# Patient Record
Sex: Female | Born: 1955 | Race: White | Hispanic: No | Marital: Married | Smoking: Former smoker
Health system: Southern US, Community
[De-identification: ages and names within clinical notes are randomized; demographics above are authoritative.]

## PROBLEM LIST (undated history)

## (undated) DIAGNOSIS — M5432 Sciatica, left side: Secondary | ICD-10-CM

## (undated) DIAGNOSIS — F329 Major depressive disorder, single episode, unspecified: Secondary | ICD-10-CM

## (undated) DIAGNOSIS — E785 Hyperlipidemia, unspecified: Secondary | ICD-10-CM

## (undated) DIAGNOSIS — Z860100 Personal history of colon polyps, unspecified: Secondary | ICD-10-CM

## (undated) DIAGNOSIS — T7840XA Allergy, unspecified, initial encounter: Secondary | ICD-10-CM

## (undated) DIAGNOSIS — F32A Depression, unspecified: Secondary | ICD-10-CM

## (undated) DIAGNOSIS — M8580 Other specified disorders of bone density and structure, unspecified site: Secondary | ICD-10-CM

## (undated) DIAGNOSIS — I2510 Atherosclerotic heart disease of native coronary artery without angina pectoris: Secondary | ICD-10-CM

## (undated) DIAGNOSIS — I10 Essential (primary) hypertension: Secondary | ICD-10-CM

## (undated) HISTORY — PX: BUNIONECTOMY: SHX129

## (undated) HISTORY — DX: Hyperlipidemia, unspecified: E78.5

## (undated) HISTORY — DX: Other specified disorders of bone density and structure, unspecified site: M85.80

## (undated) HISTORY — DX: Personal history of colonic polyps: Z86.010

## (undated) HISTORY — PX: COLONOSCOPY W/ POLYPECTOMY: SHX1380

## (undated) HISTORY — DX: Sciatica, left side: M54.32

## (undated) HISTORY — PX: NASAL SINUS SURGERY: SHX719

## (undated) HISTORY — DX: Essential (primary) hypertension: I10

## (undated) HISTORY — DX: Allergy, unspecified, initial encounter: T78.40XA

## (undated) HISTORY — DX: Major depressive disorder, single episode, unspecified: F32.9

## (undated) HISTORY — DX: Personal history of colon polyps, unspecified: Z86.0100

## (undated) HISTORY — PX: POLYPECTOMY: SHX149

## (undated) HISTORY — DX: Depression, unspecified: F32.A

## (undated) HISTORY — PX: CARDIAC CATHETERIZATION: SHX172

---

## 1997-12-04 HISTORY — PX: CERVICAL FUSION: SHX112

## 1998-03-25 ENCOUNTER — Inpatient Hospital Stay (HOSPITAL_COMMUNITY): Admission: RE | Admit: 1998-03-25 | Discharge: 1998-03-27 | Payer: Self-pay | Admitting: Neurosurgery

## 1999-07-28 ENCOUNTER — Other Ambulatory Visit: Admission: RE | Admit: 1999-07-28 | Discharge: 1999-07-28 | Payer: Self-pay | Admitting: Obstetrics and Gynecology

## 2000-10-08 ENCOUNTER — Emergency Department (HOSPITAL_COMMUNITY): Admission: EM | Admit: 2000-10-08 | Discharge: 2000-10-08 | Payer: Self-pay

## 2000-11-08 ENCOUNTER — Ambulatory Visit (HOSPITAL_COMMUNITY): Admission: RE | Admit: 2000-11-08 | Discharge: 2000-11-08 | Payer: Self-pay | Admitting: Obstetrics and Gynecology

## 2001-03-28 ENCOUNTER — Encounter: Admission: RE | Admit: 2001-03-28 | Discharge: 2001-03-28 | Payer: Self-pay | Admitting: Internal Medicine

## 2001-03-28 ENCOUNTER — Encounter: Payer: Self-pay | Admitting: Internal Medicine

## 2001-08-22 ENCOUNTER — Other Ambulatory Visit: Admission: RE | Admit: 2001-08-22 | Discharge: 2001-08-22 | Payer: Self-pay | Admitting: Obstetrics and Gynecology

## 2001-09-03 ENCOUNTER — Encounter: Payer: Self-pay | Admitting: Internal Medicine

## 2001-09-03 ENCOUNTER — Inpatient Hospital Stay (HOSPITAL_COMMUNITY): Admission: EM | Admit: 2001-09-03 | Discharge: 2001-09-06 | Payer: Self-pay | Admitting: Internal Medicine

## 2001-11-13 ENCOUNTER — Inpatient Hospital Stay (HOSPITAL_COMMUNITY): Admission: RE | Admit: 2001-11-13 | Discharge: 2001-11-15 | Payer: Self-pay | Admitting: Obstetrics & Gynecology

## 2001-11-13 ENCOUNTER — Encounter (INDEPENDENT_AMBULATORY_CARE_PROVIDER_SITE_OTHER): Payer: Self-pay | Admitting: Specialist

## 2001-12-04 HISTORY — PX: TOTAL ABDOMINAL HYSTERECTOMY W/ BILATERAL SALPINGOOPHORECTOMY: SHX83

## 2004-04-05 ENCOUNTER — Other Ambulatory Visit: Admission: RE | Admit: 2004-04-05 | Discharge: 2004-04-05 | Payer: Self-pay | Admitting: Obstetrics and Gynecology

## 2004-11-07 ENCOUNTER — Ambulatory Visit: Payer: Self-pay | Admitting: Internal Medicine

## 2005-05-25 ENCOUNTER — Ambulatory Visit: Payer: Self-pay | Admitting: Internal Medicine

## 2005-06-02 ENCOUNTER — Ambulatory Visit: Payer: Self-pay | Admitting: Internal Medicine

## 2005-08-15 ENCOUNTER — Ambulatory Visit: Payer: Self-pay | Admitting: Family Medicine

## 2006-08-29 ENCOUNTER — Ambulatory Visit: Payer: Self-pay | Admitting: Internal Medicine

## 2006-09-28 ENCOUNTER — Ambulatory Visit: Payer: Self-pay | Admitting: Internal Medicine

## 2006-10-05 ENCOUNTER — Ambulatory Visit: Payer: Self-pay | Admitting: Internal Medicine

## 2006-10-12 ENCOUNTER — Ambulatory Visit: Payer: Self-pay | Admitting: Internal Medicine

## 2006-10-12 ENCOUNTER — Encounter (INDEPENDENT_AMBULATORY_CARE_PROVIDER_SITE_OTHER): Payer: Self-pay | Admitting: Specialist

## 2006-11-29 ENCOUNTER — Ambulatory Visit: Payer: Self-pay | Admitting: Internal Medicine

## 2007-01-14 ENCOUNTER — Ambulatory Visit: Payer: Self-pay | Admitting: Internal Medicine

## 2007-03-29 ENCOUNTER — Ambulatory Visit: Payer: Self-pay | Admitting: Internal Medicine

## 2007-05-14 ENCOUNTER — Ambulatory Visit: Payer: Self-pay | Admitting: Internal Medicine

## 2007-05-14 LAB — CONVERTED CEMR LAB
Cholesterol, target level: 200 mg/dL
HDL goal, serum: 40 mg/dL
LDL Goal: 130 mg/dL

## 2007-06-04 ENCOUNTER — Telehealth: Payer: Self-pay | Admitting: Internal Medicine

## 2007-08-14 ENCOUNTER — Ambulatory Visit: Payer: Self-pay | Admitting: Internal Medicine

## 2007-08-18 LAB — CONVERTED CEMR LAB
ALT: 32 units/L (ref 0–35)
AST: 31 units/L (ref 0–37)
Cholesterol: 161 mg/dL (ref 0–200)
HDL: 39.2 mg/dL (ref >39.0)
LDL Cholesterol: 108 mg/dL — ABNORMAL HIGH (ref 0–99)
Total CHOL/HDL Ratio: 4.1
Triglycerides: 69 mg/dL (ref 0–149)
VLDL: 14 mg/dL (ref 0–40)

## 2007-08-19 ENCOUNTER — Encounter (INDEPENDENT_AMBULATORY_CARE_PROVIDER_SITE_OTHER): Payer: Self-pay | Admitting: *Deleted

## 2007-10-07 ENCOUNTER — Telehealth (INDEPENDENT_AMBULATORY_CARE_PROVIDER_SITE_OTHER): Payer: Self-pay | Admitting: *Deleted

## 2007-10-11 ENCOUNTER — Ambulatory Visit: Payer: Self-pay | Admitting: Internal Medicine

## 2007-10-11 DIAGNOSIS — E782 Mixed hyperlipidemia: Secondary | ICD-10-CM | POA: Insufficient documentation

## 2007-11-22 ENCOUNTER — Telehealth (INDEPENDENT_AMBULATORY_CARE_PROVIDER_SITE_OTHER): Payer: Self-pay | Admitting: *Deleted

## 2007-12-04 ENCOUNTER — Telehealth (INDEPENDENT_AMBULATORY_CARE_PROVIDER_SITE_OTHER): Payer: Self-pay | Admitting: *Deleted

## 2007-12-11 ENCOUNTER — Telehealth (INDEPENDENT_AMBULATORY_CARE_PROVIDER_SITE_OTHER): Payer: Self-pay | Admitting: *Deleted

## 2007-12-12 ENCOUNTER — Ambulatory Visit: Payer: Self-pay | Admitting: Internal Medicine

## 2007-12-23 LAB — CONVERTED CEMR LAB
Basophils Absolute: 0 10*3/uL (ref 0.0–0.1)
Basophils Relative: 0.1 % (ref 0.0–1.0)
Eosinophils Absolute: 0.2 10*3/uL (ref 0.0–0.6)
Eosinophils Relative: 2 % (ref 0.0–5.0)
HCT: 39.5 % (ref 36.0–46.0)
Hemoglobin: 13.9 g/dL (ref 12.0–15.0)
Lymphocytes Relative: 22 % (ref 12.0–46.0)
MCHC: 35.1 g/dL (ref 30.0–36.0)
MCV: 96.5 fL (ref 78.0–100.0)
Monocytes Absolute: 0.6 10*3/uL (ref 0.2–0.7)
Monocytes Relative: 5.4 % (ref 3.0–11.0)
Neutro Abs: 8.5 10*3/uL — ABNORMAL HIGH (ref 1.4–7.7)
Neutrophils Relative %: 70.5 % (ref 43.0–77.0)
Platelets: 230 10*3/uL (ref 150–400)
RBC: 4.1 M/uL (ref 3.87–5.11)
RDW: 11.8 % (ref 11.5–14.6)
Uric Acid, Serum: 5 mg/dL (ref 2.4–7.0)
WBC: 11.9 10*3/uL — ABNORMAL HIGH (ref 4.5–10.5)

## 2008-01-08 ENCOUNTER — Telehealth: Payer: Self-pay | Admitting: Internal Medicine

## 2008-01-08 ENCOUNTER — Telehealth (INDEPENDENT_AMBULATORY_CARE_PROVIDER_SITE_OTHER): Payer: Self-pay | Admitting: *Deleted

## 2008-01-16 ENCOUNTER — Telehealth (INDEPENDENT_AMBULATORY_CARE_PROVIDER_SITE_OTHER): Payer: Self-pay | Admitting: *Deleted

## 2008-02-14 ENCOUNTER — Telehealth (INDEPENDENT_AMBULATORY_CARE_PROVIDER_SITE_OTHER): Payer: Self-pay | Admitting: *Deleted

## 2008-04-02 ENCOUNTER — Telehealth (INDEPENDENT_AMBULATORY_CARE_PROVIDER_SITE_OTHER): Payer: Self-pay | Admitting: *Deleted

## 2008-04-07 ENCOUNTER — Telehealth (INDEPENDENT_AMBULATORY_CARE_PROVIDER_SITE_OTHER): Payer: Self-pay | Admitting: *Deleted

## 2008-05-28 ENCOUNTER — Ambulatory Visit: Payer: Self-pay | Admitting: Internal Medicine

## 2008-05-28 DIAGNOSIS — J45909 Unspecified asthma, uncomplicated: Secondary | ICD-10-CM | POA: Insufficient documentation

## 2008-07-16 ENCOUNTER — Telehealth (INDEPENDENT_AMBULATORY_CARE_PROVIDER_SITE_OTHER): Payer: Self-pay | Admitting: *Deleted

## 2008-07-28 ENCOUNTER — Ambulatory Visit: Payer: Self-pay | Admitting: Internal Medicine

## 2008-07-28 LAB — CONVERTED CEMR LAB
Bilirubin Urine: NEGATIVE
Blood in Urine, dipstick: NEGATIVE
Glucose, Urine, Semiquant: NEGATIVE
Ketones, urine, test strip: NEGATIVE
Nitrite: NEGATIVE
Protein, U semiquant: NEGATIVE
Specific Gravity, Urine: <1.005
Urobilinogen, UA: 0.2
WBC Urine, dipstick: NEGATIVE
pH: 7.5

## 2008-08-05 ENCOUNTER — Ambulatory Visit: Payer: Self-pay | Admitting: Internal Medicine

## 2008-08-10 LAB — CONVERTED CEMR LAB
ALT: 33 units/L (ref 0–35)
AST: 32 units/L (ref 0–37)
Bilirubin, Direct: 0.1 mg/dL (ref 0.0–0.3)
Cholesterol: 139 mg/dL (ref 0–200)
HDL: 39 mg/dL (ref >39.0)
Total Protein: 6.9 g/dL (ref 6.0–8.3)
VLDL: 12 mg/dL (ref 0–40)

## 2008-08-11 ENCOUNTER — Encounter (INDEPENDENT_AMBULATORY_CARE_PROVIDER_SITE_OTHER): Payer: Self-pay | Admitting: *Deleted

## 2008-08-17 ENCOUNTER — Telehealth (INDEPENDENT_AMBULATORY_CARE_PROVIDER_SITE_OTHER): Payer: Self-pay | Admitting: *Deleted

## 2008-09-09 ENCOUNTER — Ambulatory Visit: Payer: Self-pay | Admitting: Internal Medicine

## 2008-11-30 ENCOUNTER — Ambulatory Visit: Payer: Self-pay | Admitting: Internal Medicine

## 2008-11-30 DIAGNOSIS — J0100 Acute maxillary sinusitis, unspecified: Secondary | ICD-10-CM | POA: Insufficient documentation

## 2008-12-28 ENCOUNTER — Telehealth (INDEPENDENT_AMBULATORY_CARE_PROVIDER_SITE_OTHER): Payer: Self-pay | Admitting: *Deleted

## 2008-12-30 ENCOUNTER — Telehealth (INDEPENDENT_AMBULATORY_CARE_PROVIDER_SITE_OTHER): Payer: Self-pay | Admitting: *Deleted

## 2009-01-25 ENCOUNTER — Telehealth (INDEPENDENT_AMBULATORY_CARE_PROVIDER_SITE_OTHER): Payer: Self-pay | Admitting: *Deleted

## 2009-02-16 ENCOUNTER — Telehealth: Payer: Self-pay | Admitting: Internal Medicine

## 2009-03-24 ENCOUNTER — Telehealth (INDEPENDENT_AMBULATORY_CARE_PROVIDER_SITE_OTHER): Payer: Self-pay | Admitting: *Deleted

## 2009-03-29 ENCOUNTER — Telehealth (INDEPENDENT_AMBULATORY_CARE_PROVIDER_SITE_OTHER): Payer: Self-pay | Admitting: *Deleted

## 2009-04-26 ENCOUNTER — Ambulatory Visit: Payer: Self-pay | Admitting: Family Medicine

## 2009-05-20 ENCOUNTER — Telehealth (INDEPENDENT_AMBULATORY_CARE_PROVIDER_SITE_OTHER): Payer: Self-pay | Admitting: *Deleted

## 2009-05-31 ENCOUNTER — Telehealth (INDEPENDENT_AMBULATORY_CARE_PROVIDER_SITE_OTHER): Payer: Self-pay | Admitting: *Deleted

## 2009-06-15 ENCOUNTER — Telehealth (INDEPENDENT_AMBULATORY_CARE_PROVIDER_SITE_OTHER): Payer: Self-pay | Admitting: *Deleted

## 2009-06-17 ENCOUNTER — Telehealth (INDEPENDENT_AMBULATORY_CARE_PROVIDER_SITE_OTHER): Payer: Self-pay | Admitting: *Deleted

## 2009-07-29 ENCOUNTER — Ambulatory Visit: Payer: Self-pay | Admitting: Internal Medicine

## 2009-07-29 DIAGNOSIS — IMO0001 Reserved for inherently not codable concepts without codable children: Secondary | ICD-10-CM | POA: Insufficient documentation

## 2009-08-30 ENCOUNTER — Telehealth (INDEPENDENT_AMBULATORY_CARE_PROVIDER_SITE_OTHER): Payer: Self-pay | Admitting: *Deleted

## 2009-08-31 ENCOUNTER — Ambulatory Visit: Payer: Self-pay | Admitting: Internal Medicine

## 2009-09-05 LAB — CONVERTED CEMR LAB
ALT: 34 units/L (ref 0–35)
AST: 33 units/L (ref 0–37)
Alkaline Phosphatase: 67 units/L (ref 39–117)
BUN: 7 mg/dL (ref 6–23)
Basophils Absolute: 0 10*3/uL (ref 0.0–0.1)
Bilirubin, Direct: 0 mg/dL (ref 0.0–0.3)
Calcium: 9.1 mg/dL (ref 8.4–10.5)
Eosinophils Relative: 1.2 % (ref 0.0–5.0)
GFR calc non Af Amer: 79.59 mL/min (ref >60)
Glucose, Bld: 82 mg/dL (ref 70–99)
HCT: 42.8 % (ref 36.0–46.0)
HDL: 44.4 mg/dL (ref >39.00)
LDL Cholesterol: 87 mg/dL (ref 0–99)
Lymphocytes Relative: 28.8 % (ref 12.0–46.0)
Lymphs Abs: 2.4 10*3/uL (ref 0.7–4.0)
Monocytes Relative: 7 % (ref 3.0–12.0)
Neutrophils Relative %: 62.9 % (ref 43.0–77.0)
Platelets: 214 10*3/uL (ref 150.0–400.0)
Potassium: 3.9 meq/L (ref 3.5–5.1)
RDW: 12.2 % (ref 11.5–14.6)
TSH: 2.05 microintl units/mL (ref 0.35–5.50)
Total Bilirubin: 0.8 mg/dL (ref 0.3–1.2)
VLDL: 15.4 mg/dL (ref 0.0–40.0)
WBC: 8.2 10*3/uL (ref 4.5–10.5)

## 2009-09-10 ENCOUNTER — Ambulatory Visit: Payer: Self-pay | Admitting: Internal Medicine

## 2009-09-10 DIAGNOSIS — F32A Depression, unspecified: Secondary | ICD-10-CM | POA: Insufficient documentation

## 2009-09-10 DIAGNOSIS — I10 Essential (primary) hypertension: Secondary | ICD-10-CM | POA: Insufficient documentation

## 2009-09-10 DIAGNOSIS — Z860101 Personal history of adenomatous and serrated colon polyps: Secondary | ICD-10-CM | POA: Insufficient documentation

## 2009-09-10 DIAGNOSIS — F329 Major depressive disorder, single episode, unspecified: Secondary | ICD-10-CM | POA: Insufficient documentation

## 2009-09-10 DIAGNOSIS — Z86010 Personal history of colonic polyps: Secondary | ICD-10-CM | POA: Insufficient documentation

## 2009-09-13 ENCOUNTER — Encounter (INDEPENDENT_AMBULATORY_CARE_PROVIDER_SITE_OTHER): Payer: Self-pay | Admitting: *Deleted

## 2009-09-15 ENCOUNTER — Telehealth (INDEPENDENT_AMBULATORY_CARE_PROVIDER_SITE_OTHER): Payer: Self-pay | Admitting: *Deleted

## 2009-09-22 ENCOUNTER — Telehealth (INDEPENDENT_AMBULATORY_CARE_PROVIDER_SITE_OTHER): Payer: Self-pay | Admitting: *Deleted

## 2009-11-03 ENCOUNTER — Encounter (INDEPENDENT_AMBULATORY_CARE_PROVIDER_SITE_OTHER): Payer: Self-pay | Admitting: *Deleted

## 2009-11-05 ENCOUNTER — Ambulatory Visit: Payer: Self-pay | Admitting: Internal Medicine

## 2009-12-13 ENCOUNTER — Telehealth (INDEPENDENT_AMBULATORY_CARE_PROVIDER_SITE_OTHER): Payer: Self-pay | Admitting: *Deleted

## 2009-12-15 ENCOUNTER — Telehealth (INDEPENDENT_AMBULATORY_CARE_PROVIDER_SITE_OTHER): Payer: Self-pay | Admitting: *Deleted

## 2009-12-20 ENCOUNTER — Telehealth (INDEPENDENT_AMBULATORY_CARE_PROVIDER_SITE_OTHER): Payer: Self-pay | Admitting: *Deleted

## 2009-12-20 ENCOUNTER — Telehealth: Payer: Self-pay | Admitting: Internal Medicine

## 2009-12-24 ENCOUNTER — Ambulatory Visit: Payer: Self-pay | Admitting: Internal Medicine

## 2009-12-27 ENCOUNTER — Encounter: Payer: Self-pay | Admitting: Internal Medicine

## 2010-01-05 ENCOUNTER — Ambulatory Visit: Payer: Self-pay | Admitting: Internal Medicine

## 2010-01-05 DIAGNOSIS — M79609 Pain in unspecified limb: Secondary | ICD-10-CM | POA: Insufficient documentation

## 2010-01-05 DIAGNOSIS — M899 Disorder of bone, unspecified: Secondary | ICD-10-CM | POA: Insufficient documentation

## 2010-01-05 DIAGNOSIS — M949 Disorder of cartilage, unspecified: Secondary | ICD-10-CM

## 2010-01-10 LAB — CONVERTED CEMR LAB: Uric Acid, Serum: 5.2 mg/dL (ref 2.4–7.0)

## 2010-01-11 ENCOUNTER — Telehealth: Payer: Self-pay | Admitting: Internal Medicine

## 2010-01-17 ENCOUNTER — Telehealth (INDEPENDENT_AMBULATORY_CARE_PROVIDER_SITE_OTHER): Payer: Self-pay | Admitting: *Deleted

## 2010-02-04 ENCOUNTER — Ambulatory Visit: Payer: Self-pay | Admitting: Internal Medicine

## 2010-02-22 ENCOUNTER — Ambulatory Visit: Payer: Self-pay | Admitting: Internal Medicine

## 2010-02-22 DIAGNOSIS — M549 Dorsalgia, unspecified: Secondary | ICD-10-CM | POA: Insufficient documentation

## 2010-02-22 LAB — CONVERTED CEMR LAB
Blood in Urine, dipstick: NEGATIVE
Glucose, Urine, Semiquant: NEGATIVE
Nitrite: NEGATIVE
WBC Urine, dipstick: NEGATIVE

## 2010-02-23 ENCOUNTER — Encounter: Payer: Self-pay | Admitting: Internal Medicine

## 2010-03-07 ENCOUNTER — Telehealth (INDEPENDENT_AMBULATORY_CARE_PROVIDER_SITE_OTHER): Payer: Self-pay | Admitting: *Deleted

## 2010-03-22 ENCOUNTER — Ambulatory Visit: Payer: Self-pay | Admitting: Genetic Counselor

## 2010-04-18 ENCOUNTER — Telehealth (INDEPENDENT_AMBULATORY_CARE_PROVIDER_SITE_OTHER): Payer: Self-pay | Admitting: *Deleted

## 2010-04-26 ENCOUNTER — Telehealth (INDEPENDENT_AMBULATORY_CARE_PROVIDER_SITE_OTHER): Payer: Self-pay | Admitting: *Deleted

## 2010-06-03 ENCOUNTER — Telehealth (INDEPENDENT_AMBULATORY_CARE_PROVIDER_SITE_OTHER): Payer: Self-pay | Admitting: *Deleted

## 2010-06-17 ENCOUNTER — Telehealth (INDEPENDENT_AMBULATORY_CARE_PROVIDER_SITE_OTHER): Payer: Self-pay | Admitting: *Deleted

## 2010-06-28 ENCOUNTER — Telehealth (INDEPENDENT_AMBULATORY_CARE_PROVIDER_SITE_OTHER): Payer: Self-pay | Admitting: *Deleted

## 2010-06-29 ENCOUNTER — Telehealth: Payer: Self-pay | Admitting: Internal Medicine

## 2010-07-05 ENCOUNTER — Encounter: Payer: Self-pay | Admitting: Internal Medicine

## 2010-08-29 ENCOUNTER — Ambulatory Visit: Payer: Self-pay | Admitting: Internal Medicine

## 2010-08-29 DIAGNOSIS — R141 Gas pain: Secondary | ICD-10-CM | POA: Insufficient documentation

## 2010-08-29 DIAGNOSIS — M940 Chondrocostal junction syndrome [Tietze]: Secondary | ICD-10-CM | POA: Insufficient documentation

## 2010-08-29 DIAGNOSIS — R142 Eructation: Secondary | ICD-10-CM

## 2010-08-29 DIAGNOSIS — R109 Unspecified abdominal pain: Secondary | ICD-10-CM | POA: Insufficient documentation

## 2010-08-29 DIAGNOSIS — R143 Flatulence: Secondary | ICD-10-CM

## 2010-08-29 LAB — CONVERTED CEMR LAB
Bilirubin Urine: NEGATIVE
Glucose, Urine, Semiquant: NEGATIVE
Protein, U semiquant: NEGATIVE
pH: 7

## 2010-08-30 LAB — CONVERTED CEMR LAB
Eosinophils Relative: 1.5 % (ref 0.0–5.0)
HCT: 39.7 % (ref 36.0–46.0)
Lymphocytes Relative: 30 % (ref 12.0–46.0)
Lymphs Abs: 2.4 10*3/uL (ref 0.7–4.0)
Monocytes Relative: 5.6 % (ref 3.0–12.0)
Neutrophils Relative %: 62.4 % (ref 43.0–77.0)
Platelets: 217 10*3/uL (ref 150.0–400.0)
WBC: 7.9 10*3/uL (ref 4.5–10.5)

## 2010-12-01 ENCOUNTER — Telehealth (INDEPENDENT_AMBULATORY_CARE_PROVIDER_SITE_OTHER): Payer: Self-pay | Admitting: *Deleted

## 2010-12-27 ENCOUNTER — Telehealth (INDEPENDENT_AMBULATORY_CARE_PROVIDER_SITE_OTHER): Payer: Self-pay | Admitting: *Deleted

## 2011-01-03 NOTE — Progress Notes (Signed)
Summary: Colon 1-21 has question   Phone Note Call from Patient   Call For: Dr Leone Payor Reason for Call: Talk to Nurse Summary of Call: Is on antibiotics and forgot to ask if its ok to continue taking? Initial call taken by: Leanor Kail [facility],  December 20, 2009 10:22 AM  Follow-up for Phone Call        Phone Call Completed Follow-up by: Wyona Almas RN,  December 20, 2009 10:37 AM

## 2011-01-03 NOTE — Progress Notes (Signed)
Summary: Refill Request  Phone Note Refill Request Call back at [phone] Message from:  Pharmacy on June 28, 2010 8:49 AM  Refills Requested: Medication #1:  ADVAIR DISKUS 250-50 MCG/DOSE MISC BID   Dosage confirmed as above?Dosage Confirmed   Supply Requested: 3 months [facility]  Next Appointment Scheduled: NONE Initial call taken by: Lavell Islam,  June 28, 2010 8:51 AM    Prescriptions: ADVAIR DISKUS 250-50 MCG/DOSE MISC (FLUTICASONE-SALMETEROL) BID  #3 x 2   Entered by:   Shonna Chock CMA   Authorized by:   Marga Melnick MD   Signed by:   Shonna Chock CMA on 06/28/2010   Method used:   Faxed to ...       [facility] (mail-order)             , [place]         Ph:        Fax: [phone]   RxID:   [id number]

## 2011-01-03 NOTE — Progress Notes (Signed)
Summary: REFILL REQUEST  Phone Note Refill Request Call back at [phone] Message from:  Pharmacy on June 03, 2010 3:54 PM  Refills Requested: Medication #1:  SPIRIVA HANDIHALER 18 MCG CAPS 1 puff every am   Dosage confirmed as above?Dosage Confirmed   Supply Requested: 1 month   Last Refilled: 12/15/2009 [facility]  Next Appointment Scheduled: NONE Initial call taken by: Lavell Islam,  June 03, 2010 3:55 PM    Prescriptions: SPIRIVA HANDIHALER 18 MCG CAPS (TIOTROPIUM BROMIDE MONOHYDRATE) 1 puff every am  #90 Capsule x 1   Entered by:   Army Fossa CMA   Authorized by:   Marga Melnick MD   Signed by:   Army Fossa CMA on 06/03/2010   Method used:   Faxed to ...       [facility] (mail-order)             , [place]         Ph:        Fax: [phone]   RxID:   [id number]

## 2011-01-03 NOTE — Progress Notes (Signed)
Summary: advair rx  Phone Note Refill Request Message from:  Pharm[facility] [phone]  Refills Requested: Medication #1:  ADVAIR DISKUS 250-50 MCG/DOSE MISC BID Initial call taken by: Kandice Hams,  December 13, 2009 5:07 PM    Prescriptions: ADVAIR DISKUS 250-50 MCG/DOSE MISC (FLUTICASONE-SALMETEROL) BID  #3 x 3   Entered by:   Kandice Hams   Authorized by:   Marga Melnick MD   Signed by:   Kandice Hams on 12/13/2009   Method used:   Faxed to ...       [facility] (mail-order)             ,          Ph: [phone]       Fax: [phone]   RxID:   [id number]

## 2011-01-03 NOTE — Letter (Signed)
[facility]   Imported By: Sherian Rein 07/19/2010 14:39:38  _____________________________________________________________________  External Attachment:    Type:   Image     Comment:   External Document

## 2011-01-03 NOTE — Procedures (Signed)
Summary: Colonoscopy  Patient: Sarah Escobar Note: All result statuses are Final unless otherwise noted.  Tests: (1) Colonoscopy (COL)   COL Colonoscopy           DONE     [facility]     [address]     [place], [place]  [address]           COLONOSCOPY PROCEDURE REPORT           PATIENT:  Sarah Escobar, Sarah Escobar  MR#:  [id number]     BIRTHDATE:  04-10-1956, 53 yrs. old  GENDER:  female           ENDOSCOPIST:  Iva Boop, MD, [facility]           PROCEDURE DATE:  12/24/2009     PROCEDURE:  Colonoscopy with snare polypectomy     ASA CLASS:  Class II     INDICATIONS:  history of pre-cancerous (adenomatous) colon polyps,     family history of colon cancer prior adenomas (tubulovillous) 12mm     max 11/07     sister with colon cancer at 70     father with colon cancer in 50's           MEDICATIONS:   Fentanyl 50 mcg IV, Versed 8 mg IV, Benadryl 12.5     mg IV           DESCRIPTION OF PROCEDURE:   After the risks benefits and     alternatives of the procedure were thoroughly explained, informed     consent was obtained.  Digital rectal exam was performed and     revealed no abnormalities.   The LB [id number] endoscope     was introduced through the anus and advanced to the cecum, which     was identified by both the appendix and ileocecal valve, without     limitations.  The quality of the prep was adequate, using MiraLax.     The instrument was then slowly withdrawn as the colon was fully     examined.     Insertion: 6:00 minutes Withdrawal: 27:45 minutes     <<PROCEDUREIMAGES>>           FINDINGS:  Five polyps were found. Three diminutive cecal polyps,     descending polyp (7mm) and 3 mm sigmoid polyp. The polyps were     removed using cold biopsy forceps and were also snared without     cautery. Retrieval was successful.   This was otherwise a normal     examination of the colon.   Retroflexed views in the rectum     revealed external hemorrhoids.    The scope was  then withdrawn     from the patient and the procedure completed.           COMPLICATIONS:  None           ENDOSCOPIC IMPRESSION:     1) Five polyps removed, maximum size 7 mm     2) External hemorrhoids     3) Otherwise normal examination     4) Personal hx of adenomatous polyps and family history of colon     caner (father and sister)     RECOMMENDATIONS:     She (and her sister) should pursue genetic counselling due to     family history of colon cancer.           REPEAT EXAM:  In for Colonoscopy, pending biopsy results.  would     tend toward closer intervals due to # polyps and family history           Iva Boop, MD, [facility]           CC:  Pecola Lawless, MD     The Patient           n.     Rosalie Doctor:   Iva Boop at 12/24/2009 11:31 AM           Mervine, Drinda Butts, [phone]  Note: An exclamation mark (!) indicates a result that was not dispersed into the flowsheet. Document Creation Date: 12/24/2009 11:30 AM _______________________________________________________________________  (1) Order result status: Final Collection or observation date-time: 12/24/2009 11:17 Requested date-time:  Receipt date-time:  Reported date-time:  Referring Physician:   Ordering Physician: Stan Head [phone]) Specimen Source:  Source: Launa Grill Order Number: [id number] Lab site:   Appended Document: Colonoscopy     Procedures Next Due Date:    Colonoscopy: 01/2012

## 2011-01-03 NOTE — Progress Notes (Signed)
Summary: Refill--Flonase  Phone Note Refill Request Message from:  Fax from Pharmacy on June 29, 2010 3:12 PM  Refills Requested: Medication #1:  FLONASE 50 MCG/ACT SUSP 1 Spray two times a day [facility] -fax [phone]  Initial call taken by: Okey Regal Spring,  June 29, 2010 3:12 PM    Prescriptions: FLONASE 50 MCG/ACT SUSP (FLUTICASONE PROPIONATE) 1 Spray two times a day  #62mo supply x 1   Entered by:   Lucious Groves CMA   Authorized by:   Marga Melnick MD   Signed by:   Lucious Groves CMA on 06/29/2010   Method used:   Faxed to ...       [facility] (mail-order)             , [place]         Ph:        Fax: [phone]   RxID:   [id number]

## 2011-01-03 NOTE — Progress Notes (Signed)
Summary: Biopsy results   Phone Note Call from Patient Call back at [phone]   Caller: Patient Call For: Dr. Leone Payor Reason for Call: Lab or Test Results Summary of Call: Pt. is calling about biopsy results from colonoscopy Initial call taken by: Karna Christmas,  January 11, 2010 11:15 AM  Follow-up for Phone Call        Message left for patient to callback. Laureen Ochs LPN  January 11, 2010 11:33 AM   Pt. states that Lavonna Rua called her about a week ago and left a message that Dr.Lawanda Holzheimer had some things he wanted her to do. I don't see a note. Pt. is aware I will have Sheri call her back tomorrow. Follow-up by: Laureen Ochs LPN,  January 11, 2010 1:40 PM  Additional Follow-up for Phone Call Additional follow up Details #1::        Patient  has a referral in for Genetic councelor.  Records were faxed on 12-29-09 and patient still hasn't heard from cancer center.  I have left a voicemail for the scheduling coordinator to call me with the status and refaxed a copy of the records. Additional Follow-up by: Darcey Nora RN, CGRN,  January 12, 2010 9:53 AM    Additional Follow-up for Phone Call Additional follow up Details #2::    Renee from the cancer center left me a voicemail that she will be scheduling her appointment with the patient next week. Follow-up by: Darcey Nora RN, CGRN,  January 14, 2010 2:42 PM   Appended Document: Biopsy results    Clinical Lists Changes  Orders: Added new Test order of Genetic Counselor Annia Friendly ([facility]) - Signed

## 2011-01-03 NOTE — Assessment & Plan Note (Signed)
Summary: LOW BACK PAIN/RH.....   Vital Signs:  Patient profile:   55 year old female Weight:      137 pounds Temp:     98.8 degrees F oral Pulse rate:   68 / minute Resp:     16 per minute BP sitting:   130 / 82  (left arm)  Vitals Entered By: Jeremy Johann CMA (February 22, 2010 12:37 PM) CC: pain in lower back x2week Comments REVIEWED MED LIST, PATIENT AGREED DOSE AND INSTRUCTION CORRECT    CC:  pain in lower back x2week.  History of Present Illness: Acute onset as dull low back pain bilaterally  10 days  ago; after 1 week it moved  to L flank /LS area. No injury; no PMH of back issues. Rx: Tylenol , NSAIDS with some benefit  Allergies: 1)  ! Sulfa 2)  ! Floxin 3)  ! Oxycodone Hcl 4)  ! Neosporin 5)  ! Imodium A-D 6)  ! Levaquin  Review of Systems General:  Denies chills, fever, sweats, and weight loss. GI:  Denies abdominal pain, bloody stools, change in bowel habits, constipation, dark tarry stools, and diarrhea. GU:  Denies discharge, dysuria, and hematuria. MS:  Complains of low back pain and stiffness; denies muscle weakness. Derm:  Denies lesion(s) and rash. Neuro:  Denies numbness and tingling; No radicular pain.  Physical Exam  General:  in no acute distress; alert,appropriate and cooperative throughout examination Msk:  No pain to percussion L flank Extremities:  No clubbing, cyanosis, edema, or deformity noted with normal full range of motion of all joints.   Neg SLR  Neurologic:  alert & oriented X3, strength normal in all extremities, heel /toe gait normal, and DTRs symmetrical and normal.   Skin:  Intact without suspicious lesions or rashes Psych:  memory intact for recent and remote, normally interactive, and good eye contact.     Impression & Recommendations:  Problem # 1:  BACK PAIN, LEFT (ICD-724.5)  some flank involvement Her updated medication list for this problem includes:    Advil 200 Mg Caps (Ibuprofen) ..... By mouth. as needed  Arthrotec 75-200 Mg-mcg Tabs (Diclofenac-misoprostol) .Marland Kitchen... 1 two times a day as needed back pain  Orders: UA Dipstick w/o Micro (manual) ([address]) T-Culture, Urine ([address])  Complete Medication List: 1)  Flonase 50 Mcg/act Susp (Fluticasone propionate) .Marland Kitchen.. 1 spray two times a day 2)  Wellbutrin Xl 300 Mg Tb24 (Bupropion hcl) .Marland Kitchen.. 1po qd 3)  Combivent 103-18 Mcg/act Aero (Albuterol-ipratropium) .... Inhale 1 or 2 puffs every 4 hours as needed only as rescue agent 4)  Advair Diskus 250-50 Mcg/dose Misc (Fluticasone-salmeterol) .... Bid 5)  Est Estrogens-methyltest Hs 0.625-1.25 Mg Tabs (Est estrogens-methyltest) .Marland Kitchen.. 1 by mouth qhs 6)  Spiriva Handihaler 18 Mcg Caps (Tiotropium bromide monohydrate) .Marland Kitchen.. 1 puff every am 7)  Advil 200 Mg Caps (Ibuprofen) .... By mouth. as needed 8)  Robitussin Dm Syrp (Dextromethorphan-guaifenesin syrp) .... As needed 9)  Pravachol 40 Mg Tabs (Pravastatin sodium) .Marland Kitchen.. 1 by mouth at bedtime, appointment requested for additional refills 10)  Zyrtec 10mg   .... 1 by mouth qd 11)  Glucosamine Sulfate  12)  Flax Seed Oil  13)  Calcium  14)  Multivitamin  15)  Metoprolol Tartrate 25 Mg Tabs (Metoprolol tartrate) .... 1/2 tab two times a day 16)  Albuterol Sulfate 0.083%  .Marland Kitchen.. 1 amp q 4 hrs prn 17)  Qvar 80 Mcg/act Aers (Beclomethasone dipropionate) .... 2 puffs every 12 hrs ; gargle & spit  after use 18)  Arthrotec 75-200 Mg-mcg Tabs (Diclofenac-misoprostol) .Marland Kitchen.. 1 two times a day as needed back pain  Patient Instructions: 1)  Samples two times a day pn for back pain. 2)  Drink as much fluid as you can tolerate for the next few days. Prescriptions: ARTHROTEC 75-200 MG-MCG TABS (DICLOFENAC-MISOPROSTOL) 1 two times a day as needed back pain  #14 x 0   Entered and Authorized by:   Marga Melnick MD   Signed by:   Marga Melnick MD on 02/22/2010   Method used:   Samples Given   RxID:   [id number]   Laboratory Results   Urine Tests   Date/Time  Reported: February 22, 2010 12:43 PM  Routine Urinalysis   Color: yellow Appearance: Clear Glucose: negative   (Normal Range: Negative) Bilirubin: negative   (Normal Range: Negative) Ketone: negative   (Normal Range: Negative) Spec. Gravity: <1.005   (Normal Range: 1.003-1.035) Blood: negative   (Normal Range: Negative) pH: 7.0   (Normal Range: 5.0-8.0) Protein: negative   (Normal Range: Negative) Urobilinogen: 0.2   (Normal Range: 0-1) Nitrite: negative   (Normal Range: Negative) Leukocyte Esterace: negative   (Normal Range: Negative)

## 2011-01-03 NOTE — Progress Notes (Signed)
Summary: qvar and spiriva refill   Phone Note Refill Request Message from:  Fax from Pharmacy on December 15, 2009 10:53 AM  Refills Requested: Medication #1:  QVAR 80 MCG/ACT  AERS 2 puffs every 12 hrs ; gargle & spit after use  Medication #2:  SPIRIVA HANDIHALER 18 MCG CAPS 1 puff every am Initial call taken by: Doristine Devoid,  December 15, 2009 10:54 AM    Prescriptions: QVAR 80 MCG/ACT  AERS (BECLOMETHASONE DIPROPIONATE) 2 puffs every 12 hrs ; gargle & spit after use  #3 month x 1   Entered by:   Doristine Devoid   Authorized by:   Marga Melnick MD   Signed by:   Doristine Devoid on 12/15/2009   Method used:   Electronically to        [facility] (mail-order)             ,          Ph: [phone]       Fax: [phone]   RxID:   [id number] SPIRIVA HANDIHALER 18 MCG CAPS (TIOTROPIUM BROMIDE MONOHYDRATE) 1 puff every am  #90 Capsule x 1   Entered by:   Doristine Devoid   Authorized by:   Marga Melnick MD   Signed by:   Doristine Devoid on 12/15/2009   Method used:   Electronically to        [facility] [facility]* (mail-order)             ,          Ph: [phone]       Fax: [phone]   RxID:   [id number]

## 2011-01-03 NOTE — Progress Notes (Signed)
Summary: Refill Request  Phone Note Refill Request Call back at [phone] Message from:  Pharmacy on Apr 26, 2010 1:18 PM  Refills Requested: Medication #1:  ALBUTEROL SULFATE 0.083% 1 amp q 4 hrs prn   Dosage confirmed as above?Dosage Confirmed   Supply Requested: [facility]  Next Appointment Scheduled: none Initial call taken by: Harold Barban,  Apr 26, 2010 1:19 PM    Prescriptions: ALBUTEROL SULFATE 0.083% 1 amp q 4 hrs prn  #32mos x 2   Entered by:   Shonna Chock   Authorized by:   Marga Melnick MD   Signed by:   Shonna Chock on 04/27/2010   Method used:   Faxed to ...       [facility] (mail-order)             ,          Ph: [phone]       Fax: [phone]   RxID:   [id number]

## 2011-01-03 NOTE — Progress Notes (Signed)
Summary: REFILL  Phone Note Refill Request Message from:  Fax from Pharmacy on March 07, 2010 1:06 PM  Refills Requested: Medication #1:  METOPROLOL TARTRATE 25 MG  TABS 1/2 tab two times a day [facility] FAX [phone]   Method Requested: Fax to Local Pharmacy Next Appointment Scheduled: NO APPT Initial call taken by: Barb Merino,  March 07, 2010 1:07 PM    Prescriptions: METOPROLOL TARTRATE 25 MG  TABS (METOPROLOL TARTRATE) 1/2 tab two times a day  #90 x 3   Entered by:   Shonna Chock   Authorized by:   Marga Melnick MD   Signed by:   Shonna Chock on 03/07/2010   Method used:   Faxed to ...       [facility] (mail-order)             ,          Ph: [phone]       Fax: [phone]   RxID:   [id number]

## 2011-01-03 NOTE — Letter (Signed)
Summary: Patient Notice- Polyp Results  [facility]  [address] [place], [place] [address]   Phone: [phone]  Fax: [phone]        December 27, 2009 MRN: [id number]    [facility] [address] [place], [place]  [address]    Dear Ms. Ratcliffe,  The polyps removed from your colon were adenomatous. This means that they were pre-cancerous or that  they had the potential to change into cancer over time.   I recommend that you have a repeat colonoscopy in 2 years to determine if you have developed any new polyps over time. If you develop any new rectal bleeding, abdominal pain or significant bowel habit changes, please contact us before then.  Given your family history of colon cancer in your father and sister, I recommend a genetics evaluation also. You should have discussed making this appointment via phone call from my staff by the timne you receive this letter.  In addition to repeating colonoscopy, changing health habits may reduce your risk of having more colon polyps and possibly, colon cancer. You may lower your risk of future polyps and colon cancer by adopting healthy habits such as not smoking, being physically active, losing weight (if overweight), and eating a diet which includes fruits and vegetables and limits red meat.  Please call us if you are having persistent problems or have questions about your condition that have not been fully answered at this time.  Sincerely,  Iva Boop MD, [facility]  This letter has been electronically signed by your physician.  Appended Document: Patient Notice- Polyp Results Letter mailed 1.27.11

## 2011-01-03 NOTE — Progress Notes (Signed)
Summary: rx  Phone Note Refill Request Message from:  [facility]  Refills Requested: Medication #1:  ALBUTEROL SULFATE 0.083% 1 amp q 4 hrs prn please include directions [phone]  Initial call taken by: Kandice Hams,  December 20, 2009 1:18 PM  Follow-up for Phone Call        Alida please clarify. The instructions are right beside the name of the Med. Follow-up by: Shonna Chock,  December 20, 2009 1:42 PM  Additional Follow-up for Phone Call Additional follow up Details #1::          DONE Additional Follow-up by: Kandice Hams,  December 20, 2009 2:32 PM    Prescriptions: ALBUTEROL SULFATE 0.083% 1 amp q 4 hrs prn  #9mos x 1   Entered by:   Kandice Hams   Authorized by:   Marga Melnick MD   Signed by:   Kandice Hams on 12/20/2009   Method used:   Faxed to ...       [facility] (mail-order)             ,          Ph: [phone]       Fax: [phone]   RxID:   [id number]

## 2011-01-03 NOTE — Progress Notes (Signed)
Summary: refill  Phone Note Refill Request Message from:  Fax from Pharmacy on Apr 18, 2010 2:57 PM  Refills Requested: Medication #1:  WELLBUTRIN XL 300 MG TB24 1po qd [facility] - fax [phone] --- ph [phone] ext [phone]   Method Requested: Fax to Mail Away Pharmacy Next Appointment Scheduled: none Initial call taken by: Okey Regal Spring,  Apr 18, 2010 3:00 PM    Prescriptions: WELLBUTRIN XL 300 MG TB24 (BUPROPION HCL) 1po qd  #90 x 3   Entered by:   Shonna Chock   Authorized by:   Marga Melnick MD   Signed by:   Shonna Chock on 04/18/2010   Method used:   Faxed to ...       [facility] (mail-order)             ,          Ph: [phone]       Fax: [phone]   RxID:   [id number]

## 2011-01-03 NOTE — Assessment & Plan Note (Signed)
Summary: STOMACH HURT, BLOATING, PAIN///SPH   Vital Signs:  Patient profile:   55 year old female Height:      65.5 inches Weight:      140.13 pounds BMI:     23.05 Pulse rate:   86 / minute Pulse rhythm:   regular Resp:     16 per minute BP sitting:   122 / 80  (left arm) Cuff size:   regular  Vitals Entered By: Army Fossa CMA (August 29, 2010 12:42 PM) CC: Right side stomach pain, very bloated. Started last Thursday. Denies any N/V/D., Abdominal pain   CC:  Right side stomach pain, very bloated. Started last Thursday. Denies any N/V/D., and Abdominal pain.  History of Present Illness: Abdominal Pain      This is a 55 year old woman who presents with Abdominal pain since 08/25/2010.  The patient reports  bloating & some  constipation, but denies nausea, vomiting, diarrhea, melena, hematochezia, anorexia, and hematemesis.  The location of the pain is right upper quadrant laterally  in mid axillary line.  The pain is described as constant and sharp.  The patient denies the following symptoms: fever, weight loss, dysuria, chest pain, jaundice, dark urine, and vaginal bleeding.  The pain is worse with movement such as getting out of bed or bending over.  The pain has no relievers; but it is better today w/o treatment. PMH of similar pain in 2010; it resolved w/o Rx. Colon polyps resected in 2011 by Dr Leone Payor. PMH of TAH & BSO for Endometriosis.  Omnicef & Prednisone Rxed  0904/2011 for bronchitis @ [facility].  Current Medications (verified): 1)  Flonase 50 Mcg/act Susp (Fluticasone Propionate) .Marland Kitchen.. 1 Spray Two Times A Day 2)  Wellbutrin Xl 300 Mg Tb24 (Bupropion Hcl) .Marland Kitchen.. 1po Qd 3)  Combivent 103-18 Mcg/act Aero (Albuterol-Ipratropium) .... Inhale 1 or 2 Puffs Every 4 Hours As Needed Only As Rescue Agent 4)  Advair Diskus 250-50 Mcg/dose Misc (Fluticasone-Salmeterol) .... Bid 5)  Est Estrogens-Methyltest Hs 0.625-1.25 Mg Tabs (Est Estrogens-Methyltest) .Marland Kitchen.. 1 By Mouth Qhs 6)  Spiriva  Handihaler 18 Mcg Caps (Tiotropium Bromide Monohydrate) .Marland Kitchen.. 1 Puff Every Am 7)  Advil 200 Mg Caps (Ibuprofen) .... By Mouth. As Needed 8)  Robitussin Dm  Syrp (Dextromethorphan-Guaifenesin Syrp) .... As Needed 9)  Pravachol 40 Mg Tabs (Pravastatin Sodium) .Marland Kitchen.. 1 By Mouth At Bedtime, Appointment Requested For Additional Refills 10)  Zyrtec 10mg  .... 1 By Mouth Qd 11)  Glucosamine Sulfate 12)  Flax Seed Oil 13)  Calcium 14)  Multivitamin 15)  Metoprolol Tartrate 25 Mg  Tabs (Metoprolol Tartrate) .... 1/2 Tab Two Times A Day 16)  Albuterol Sulfate 0.083% .Marland KitchenMarland Kitchen. 1 Amp Q 4 Hrs Prn 17)  Qvar 80 Mcg/act  Aers (Beclomethasone Dipropionate) .... 2 Puffs Every 12 Hrs ; Gargle & Spit After Use  Allergies: 1)  ! Sulfa 2)  ! Floxin 3)  ! Oxycodone Hcl 4)  ! Neosporin 5)  ! Imodium A-D 6)  ! 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 MS:  Denies thoracic pain. Derm:  Denies lesion(s) and rash. Neuro:  Denies brief paralysis, numbness, tingling, and weakness; No radicular pain R thoracic area.  Physical Exam  General:  well-nourished,in no acute distress; alert,appropriate and cooperative throughout examination Eyes:  No corneal or conjunctival inflammation noted.  No icterus Mouth:  Oral mucosa and oropharynx without lesions or exudates.  No pharyngeal erythema.   Chest Wall:  costochondrial tenderness @ R inferior rib cage in mid axillary area.   Lungs:  Normal respiratory effort, chest expands  symmetrically. Lungs are clear to auscultation, no crackles or wheezes. Heart:  Normal rate and regular rhythm. S1 and S2 normal without gallop, murmur, click, rub .S4 Abdomen:  Bowel sounds positive,abdomen soft and non-tender without masses, organomegaly or hernias noted. Skin:  Intact without suspicious lesions or rashes. No jaundice Cervical Nodes:  No lymphadenopathy noted Axillary Nodes:  No palpable lymphadenopathy Psych:  memory intact for recent and remote, normally interactive, and good eye contact.     Impression & Recommendations:  Problem # 1:  FLANK PAIN, RIGHT (ICD-789.09) Clinically costochondritis present rather than abdominal pain The following medications were removed from the medication list:    Arthrotec 75-200 Mg-mcg Tabs (Diclofenac-misoprostol) .Marland Kitchen... 1 two times a day as needed back pain Her updated medication list for this problem includes:    Advil 200 Mg Caps (Ibuprofen) ..... By mouth. as needed  Orders: UA Dipstick w/o Micro (automated)  (81003) Venipuncture (16109) TLB-CBC Platelet - w/Differential (85025-CBCD) Specimen Handling (60454)  Problem # 2:  COSTOCHONDRITIS (ICD-733.6)  Orders: Venipuncture (09811) T-2 View CXR (71020TC)  Problem # 3:  BRONCHITIS-ACUTE (ICD-466.0) S/P ? Omnicef this month Her updated medication list for this problem includes:    Combivent 103-18 Mcg/act Aero (Albuterol-ipratropium) ..... Inhale 1 or 2 puffs every 4 hours as needed only as rescue agent    Advair Diskus 250-50 Mcg/dose Misc (Fluticasone-salmeterol) ..... Bid    Spiriva Handihaler 18 Mcg Caps (Tiotropium bromide monohydrate) .Marland Kitchen... 1 puff every am    Robitussin Dm Syrp (Dextromethorphan-guaifenesin syrp) .Marland Kitchen... As needed    Qvar 80 Mcg/act Aers (Beclomethasone dipropionate) .Marland Kitchen... 2 puffs every 12 hrs ; gargle & spit after use  Orders: Venipuncture (91478) TLB-CBC Platelet - w/Differential (85025-CBCD) T-2 View CXR (71020TC) Specimen Handling  (99000)  Complete Medication List: 1)  Flonase 50 Mcg/act Susp (Fluticasone propionate) .Marland Kitchen.. 1 spray two times a day 2)  Wellbutrin Xl 300 Mg Tb24 (Bupropion hcl) .Marland Kitchen.. 1po qd 3)  Combivent 103-18 Mcg/act Aero (Albuterol-ipratropium) .... Inhale 1 or 2 puffs every 4 hours as needed only as rescue agent 4)  Advair Diskus 250-50 Mcg/dose Misc (Fluticasone-salmeterol) .... Bid 5)  Est Estrogens-methyltest Hs 0.625-1.25 Mg Tabs (Est estrogens-methyltest) .Marland Kitchen.. 1 by mouth qhs 6)  Spiriva Handihaler 18 Mcg Caps (Tiotropium bromide monohydrate) .Marland Kitchen.. 1 puff every am 7)  Advil 200 Mg Caps (Ibuprofen) .... By mouth. as needed 8)  Robitussin Dm Syrp (Dextromethorphan-guaifenesin syrp) .... As needed 9)  Pravachol 40 Mg Tabs (Pravastatin sodium) .Marland Kitchen.. 1 by mouth at bedtime, appointment requested for additional refills 10)  Zyrtec 10mg   .... 1 by mouth qd 11)  Glucosamine Sulfate  12)  Flax Seed Oil  13)  Calcium  14)  Multivitamin  15)  Metoprolol Tartrate 25 Mg Tabs (Metoprolol tartrate) .... 1/2 tab two times a day 16)  Albuterol Sulfate 0.083%  .Marland Kitchen.. 1 amp q 4 hrs prn 17)  Qvar 80 Mcg/act Aers (Beclomethasone dipropionate) .... 2 puffs every 12 hrs ; gargle &  spit after use 18)  Hyoscyamine Sulfate 0.125 Mg Subl (Hyoscyamine sulfate) .Marland Kitchen.. 1 under tongue every 6 hrs as needed bloating  Patient Instructions: 1)  Meds as Rxed. Prescriptions: HYOSCYAMINE SULFATE 0.125 MG SUBL (HYOSCYAMINE SULFATE) 1 under tongue every 6 hrs as needed bloating  #30 x 0   Entered and Authorized by:   Marga Melnick MD   Signed by:   Marga Melnick MD on 08/29/2010   Method used:   Print then Give to Patient   RxID:   [id number]   Laboratory Results   Urine Tests    Routine Urinalysis   Color: yellow Appearance: Clear Glucose: negative   (Normal Range: Negative) Bilirubin: negative   (Normal Range: Negative) Ketone: negative   (Normal Range: Negative) Spec. Gravity: 1.020   (Normal Range:  1.003-1.035) Blood: negative   (Normal Range: Negative) pH: 7.0   (Normal Range: 5.0-8.0) Protein: negative   (Normal Range: Negative) Urobilinogen: 0.2   (Normal Range: 0-1) Nitrite: negative   (Normal Range: Negative) Leukocyte Esterace: negative   (Normal Range: Negative)    Comments: Army Fossa CMA  August 29, 2010 12:47 PM

## 2011-01-03 NOTE — Progress Notes (Signed)
Summary: REFILL  Phone Note Refill Request Message from:  Fax from Pharmacy on January 17, 2010 10:08 AM  Refills Requested: Medication #1:  FLONASE 50 MCG/ACT SUSP 1 Spray two times a day [facility] FAX [phone]   Method Requested: Mail to Pharmacy Next Appointment Scheduled: NO APPT Initial call taken by: Barb Merino,  January 17, 2010 10:09 AM    Prescriptions: FLONASE 50 MCG/ACT SUSP (FLUTICASONE PROPIONATE) 1 Spray two times a day  #31mo supply x 1   Entered by:   Shonna Chock   Authorized by:   Marga Melnick MD   Signed by:   Shonna Chock on 01/17/2010   Method used:   Faxed to ...       [facility] (mail-order)             ,          Ph: [phone]       Fax: [phone]   RxID:   [id number]

## 2011-01-03 NOTE — Assessment & Plan Note (Signed)
Summary: PLACE ON OUTSIDE OF FOOT/KDC   Vital Signs:  Patient profile:   55 year old female Weight:      138.2 pounds Pulse rate:   64 / minute Resp:     14 per minute BP sitting:   112 / 70  (left arm) Cuff size:   regular  Vitals Entered By: Shonna Chock (January 05, 2010 10:52 AM) CC: Examine area on left foot: swollen and sore Comments REVIEWED MED LIST, PATIENT AGREED DOSE AND INSTRUCTION CORRECT    CC:  Examine area on left foot: swollen and sore.  History of Present Illness: Lateral L foot throbbing pain intermittently with edema X 2.5 weeks w/o injury; worse with ambulation , esp barefoot. Rx: none.No  PMH of MS disease. BMD revealed mild Osteopenia ? 4-5 yrs.Bunionectomy R great toe.  Allergies: 1)  ! Sulfa 2)  ! Floxin 3)  ! Oxycodone Hcl (Oxycodone Hcl) 4)  ! Neosporin 5)  ! Imodium A-D 6)  ! Levaquin  Past History:  Past Medical History: Asthma Depression Hyperlipidemia:NMR : LDL 161(0960 total/ 1392 small dense particles),HDL 45, TG 90 for 18% risk; LDL goal = < 95, ideally < 75 Hypertension Colonic polyps, hx of Osteopenia  Review of Systems General:  Denies chills, fever, and sweats. MS:  Complains of joint pain, joint redness, and joint swelling.  Physical Exam  General:  well-nourished,in no acute distress; alert,appropriate and cooperative throughout examination Pulses:  R and L dorsalis pedis and posterior tibial pulses are full and equal bilaterally Extremities:  No clubbing, cyanosis, edema.Non tender Osteoma L lateral foot; smaller osteoma or R in same location.Full ROM. Op scar base R great toe. No plantar fasciitis. arches low Skin:  Intact without suspicious lesions or rashes   Impression & Recommendations:  Problem # 1:  FOOT PAIN, LEFT (ICD-729.5)  Orders: Venipuncture (45409) TLB-Uric Acid, Blood (84550-URIC) Radiology Referral (Radiology) T-Foot Left Min 3 Views (73630TC)  Problem # 2:  OSTEOPENIA  (ICD-733.90)  Orders: Venipuncture (81191) Radiology Referral (Radiology)  Complete Medication List: 1)  Flonase 50 Mcg/act Susp (Fluticasone propionate) .Marland Kitchen.. 1 spray two times a day 2)  Wellbutrin Xl 300 Mg Tb24 (Bupropion hcl) .Marland Kitchen.. 1po qd 3)  Combivent 103-18 Mcg/act Aero (Albuterol-ipratropium) .... Inhale 1 or 2 puffs every 4 hours as needed only as rescue agent 4)  Advair Diskus 250-50 Mcg/dose Misc (Fluticasone-salmeterol) .... Bid 5)  Est Estrogens-methyltest Hs 0.625-1.25 Mg Tabs (Est estrogens-methyltest) .Marland Kitchen.. 1 by mouth qhs 6)  Spiriva Handihaler 18 Mcg Caps (Tiotropium bromide monohydrate) .Marland Kitchen.. 1 puff every am 7)  Advil 200 Mg Caps (Ibuprofen) .... By mouth. as needed 8)  Robitussin Dm Syrp (Dextromethorphan-guaifenesin syrp) .... As needed 9)  Pravachol 40 Mg Tabs (Pravastatin sodium) .Marland Kitchen.. 1 by mouth at bedtime, appointment requested for additional refills 10)  Zyrtec 10mg   .... 1 by mouth qd 11)  Glucosamine Sulfate  12)  Flax Seed Oil  13)  Calcium  14)  Multivitamin  15)  Metoprolol Tartrate 25 Mg Tabs (Metoprolol tartrate) .... 1/2 tab two times a day 16)  Albuterol Sulfate 0.083%  .Marland Kitchen.. 1 amp q 4 hrs prn 17)  Qvar 80 Mcg/act Aers (Beclomethasone dipropionate) .... 2 puffs every 12 hrs ; gargle & spit after use  Patient Instructions: 1)  AS Tylenol or Aleve  1-2 every 8 hrs as needed .Wear arch supports. Dr Charlsie Merles if studies negative.

## 2011-01-03 NOTE — Miscellaneous (Signed)
Summary: BONE DENSITY  Clinical Lists Changes  Orders: Added new Test order of T-Bone Densitometry (77080) - Signed Added new Test order of T-Lumbar Vertebral Assessment (77082) - Signed 

## 2011-01-03 NOTE — Progress Notes (Signed)
Summary: Refill Request  Phone Note Refill Request Call back at [phone] Message from:  Pharmacy on June 17, 2010 12:07 PM  Refills Requested: Medication #1:  COMBIVENT 103-18 MCG/ACT AERO INHALE 1 OR 2 PUFFS EVERY 4 HOURS AS NEEDED ONLY AS RESCUE AGENT   Dosage confirmed as above?Dosage Confirmed   Supply Requested: 3 months [facility]  Next Appointment Scheduled: none Initial call taken by: Lavell Islam,  June 17, 2010 12:08 PM    Prescriptions: COMBIVENT 103-18 MCG/ACT AERO (ALBUTEROL-IPRATROPIUM) INHALE 1 OR 2 PUFFS EVERY 4 HOURS AS NEEDED ONLY AS RESCUE AGENT  #42mo supply x 1   Entered by:   Shonna Chock CMA   Authorized by:   Marga Melnick MD   Signed by:   Shonna Chock CMA on 06/17/2010   Method used:   Faxed to ...       [facility] (mail-order)             , [place]         Ph:        Fax: [phone]   RxID:   [id number]

## 2011-01-04 ENCOUNTER — Telehealth: Payer: Self-pay | Admitting: Internal Medicine

## 2011-01-05 NOTE — Progress Notes (Signed)
Summary: Refill Request  Phone Note Refill Request Call back at [phone] Message from:  Pharmacy on December 27, 2010 2:38 PM  Refills Requested: Medication #1:  FLONASE 50 MCG/ACT SUSP 1 Spray two times a day   Dosage confirmed as above?Dosage Confirmed   Supply Requested: 3 months [facility]  Initial call taken by: Harold Barban,  December 27, 2010 2:38 PM    Prescriptions: FLONASE 50 MCG/ACT SUSP (FLUTICASONE PROPIONATE) 1 Spray two times a day  #97mo supply x 1   Entered by:   Shonna Chock CMA   Authorized by:   Marga Melnick MD   Signed by:   Shonna Chock CMA on 12/28/2010   Method used:   Faxed to ...       [facility] (mail-order)             , [place]         Ph: [phone]       Fax: [phone]   RxID:   [id number]

## 2011-01-05 NOTE — Progress Notes (Signed)
Summary: Refill Request   Phone Note Refill Request Message from:  Pharmacy  Refills Requested: Medication #1:  SPIRIVA HANDIHALER 18 MCG CAPS 1 puff every am [facility]   Method Requested: Fax to [facility] Initial call taken by: Shonna Chock CMA,  December 01, 2010 2:01 PM    Prescriptions: SPIRIVA HANDIHALER 18 MCG CAPS (TIOTROPIUM BROMIDE MONOHYDRATE) 1 puff every am  #90 Capsule x 1   Entered by:   Shonna Chock CMA   Authorized by:   Marga Melnick MD   Signed by:   Shonna Chock CMA on 12/01/2010   Method used:   Faxed to ...       [facility] (retail)             ,          Ph: [phone]       Fax: [phone]   RxID:   [id number]

## 2011-01-11 NOTE — Progress Notes (Signed)
Summary: Pravastatin rx  Phone Note Call from Patient   Summary of Call: Patient called wondering why she was only given #30 of her Pravastatin. Patient was notified that this was done because it has been marked that she is due for an appt. Patient stated ok and ended the call.  Initial call taken by: Lucious Groves CMA,  January 04, 2011 8:51 AM

## 2011-01-27 ENCOUNTER — Telehealth (INDEPENDENT_AMBULATORY_CARE_PROVIDER_SITE_OTHER): Payer: Self-pay | Admitting: *Deleted

## 2011-01-31 NOTE — Progress Notes (Signed)
Summary: Refill Request  Phone Note Refill Request Message from:  Patient  Refills Requested: Medication #1:  METOPROLOL TARTRATE 25 MG  TABS 1/2 tab two times a day [facility] Fax: [phone]   Method Requested: Fax to Mail Away Pharmacy Initial call taken by: Shonna Chock CMA,  January 27, 2011 5:03 PM    Prescriptions: METOPROLOL TARTRATE 25 MG  TABS (METOPROLOL TARTRATE) 1/2 tab two times a day  #90 x 1   Entered by:   Shonna Chock CMA   Authorized by:   Marga Melnick MD   Signed by:   Shonna Chock CMA on 01/27/2011   Method used:   Electronically to        [facility]* (retail)             ,          Ph: [phone]       Fax: [phone]   RxID:   [id number]

## 2011-03-13 ENCOUNTER — Other Ambulatory Visit: Payer: Self-pay | Admitting: Internal Medicine

## 2011-03-20 ENCOUNTER — Telehealth: Payer: Self-pay | Admitting: Internal Medicine

## 2011-03-20 NOTE — Telephone Encounter (Signed)
Added orders to 6/19 lab

## 2011-03-20 NOTE — Telephone Encounter (Signed)
Lipid, Hep,BMP,CBCD, TSH, Stool Cards, Vit D,  V70.0/272.4/995.20/401.9/733.90

## 2011-03-20 NOTE — Telephone Encounter (Signed)
Patient has CPX on 6/26 and labs on 6/19----(will check with her insurance company)----what orders and codes do you want to use???/   thanks

## 2011-03-31 ENCOUNTER — Encounter: Payer: Self-pay | Admitting: Internal Medicine

## 2011-04-03 ENCOUNTER — Other Ambulatory Visit: Payer: Self-pay | Admitting: Internal Medicine

## 2011-04-21 NOTE — Op Note (Signed)
[facility]  Patient:    Sarah Escobar, Sarah Escobar                       MRN: [id number] Proc. Date: 11/08/00 Adm. Date:  [id number] Attending:  Miguel Aschoff                           Operative Report  PREOPERATIVE DIAGNOSIS:       Pelvic pain.  POSTOPERATIVE DIAGNOSIS:      Pelvic endometriosis.  OPERATION:                    Diagnostic laparoscopy with laser uterosacral nerve ablation and ablation of endometriosis.  SURGEON:                      Miguel Aschoff, M.D.  ANESTHESIA:                   General.  COMPLICATIONS:                None.  JUSTIFICATION:                The patient is a 55 year old white female with history of severe lower abdominal pain and back pain, worsening over the last month, that has not responded to conservative medical therapy.  Outpatient evaluation was nonrevealing for an etiology of this pain.  Because of its persistence, she presents now to undergo laparoscopy to see if an etiology for the pain can be established and corrected.  Informed consent has been obtained.  DESCRIPTION OF PROCEDURE:     The patient was taken to the operating room, placed in the supine position.  General anesthesia was administered without difficulty.  She was then placed in dorsolithotomy position, prepped and draped in the usual sterile fashion.  The bladder was catheterized.  The speculum was placed in the vaginal vault, and the anterior cervical lip was grasped with the tenaculum.  The cervix was dilated using serial Pratt dilators in order to ensure that the patient did not have any element of cervical stenosis.  After this was done, the Hulka tenaculum was placed through the cervix and held.  Attention was then directed to the abdomen where a small infraumbilical incision was made.  A Veress needle was inserted, and the abdomen was insufflated with 3 liters of CO2 under monitored pressure.  Following the insufflation, the laparoscope was  placed without difficulty.  A second puncture was then made under direct visualization, and a 5 mm port was established suprapubically.  Systematic inspection of the pelvic organs showed the uterus to be anterior, normal size and shape.  The anterior bladder peritoneum was unremarkable.  The round ligaments were unremarkable.  There was no evidence of any inguinal hernias.  The uterus was normal size and shape.  The tubes were then inspected.  They were traced out to the fimbriated ends.  The fimbriae were fine and delicate.  The tubes were normal along their course.  The ovaries were then inspected and appeared to be totally within normal limits.  Inspection of the cul-de-sac, however, revealed typical lesions of endometriosis, most significantly on the right lateral pelvic sidewall and in the cul-de-sac.  There was thickening of the right uterosacral ligament and pale plaques consistent with endometriosis on both ligaments. Similar pale plaques were noted diffusely on the right lateral pelvic sidewall.  Several typical powder burn  lesions of endometriosis were noted in the cul-de-sac.  The appendix was visualized and found to be within normal limits.  The liver appeared to be unremarkable.  The intestinal surfaces were unremarkable.  At this point, using the YAG laser with 15 watts of power and using a GRP6 laser tip, the uterosacral ligaments were partially transected in an effort to decrease the patients pain.  This was done without difficulty and with good hemostasis.  After this was achieved, the implants that were accessible were then laser ablated without difficulty and with care to avoid any injury to adjacent structures.  After this was completed, with no other abnormalities being noted, and with good hemostasis present, it was elected to complete the procedure.  The instruments were removed.  CO2 was allowed to escape.  Lap counts and instrument counts were taken and found to be  correct.  The small incisions were closed using subcuticular 4-0 Vicryl.  The patient tolerated the procedure well the procedure well.  The estimated blood loss was less than 15 cc.  The patient was taken to the recovery room in satisfactory condition.  Plan is for the patient to be seen back in four weeks for followup examination.  She is to call if there are any problems such as severe pain or heavy bleeding.  Medications for home include Darvocet-N 100 one every 4 to 6 hours as needed for pain.  The patients clinical response will be reassessed in three to four weeks.  If good pain continues, she will be started on Lupron therapy in an effort to complete the treatment of her endometriosis. DD:  11/08/00 TD:  11/08/00 Job: [id number]

## 2011-04-21 NOTE — Discharge Summary (Signed)
[facility]  Patient:    Sarah Escobar, Sarah Escobar Visit Number: [id number] MRN: [id number]          Service Type: MED Location: 3W [id number] 01 Attending Physician:  Dolores Patty Dictated by:   Stacie Glaze, M.D. [facility] Admit Date:  09/03/2001 Disc. Date: 09/06/01   CC:         Titus Dubin. Alwyn Ren, M.D. [facility], [place] [facility]   Discharge Summary  HOSPITAL COURSE:   The patient is a 55 year old white female with COPD exacerbation in setting of continued tobacco abuse who was admitted by Dr. Marga Melnick after having been seen at an urgent care and then in the office and failed outpatient therapy with prednisone dosepak and Augmentin.  She was admitted to the internal medicine service for hand-held nebulizer treatment, IV steroids, and appropriate antibiotic therapy.  X-rays showed moderate to severe COPD with no apparent infiltrate.  She had an elevated white count probably secondary to the Medrol.  She had been given an course of Augmentin and/or Avelox outpatient.  She was admitted, given initial IV steroids and hand-held nebulizer treatments, converted to Advair and p.o. steroids and p.o. antibiotics.  On the day of discharge, her wheezing was markedly reduced.  She was 97% on room air.   She was not short of breath at rest, but with activity she did notice some persistent shortness of breath.  DISPOSITION:  She is discharged in stable condition for continued medical therapy at home.  DISCHARGE MEDICATIONS: 1. Ceftin 250 mg twice a day for 7 days. 2. Azithromycin 250 mg 1 a day 3. Deltasone on a 10-day taper. 4. Wellbutrin controlled release daily. 5. Advair 250 one puff twice a day and then convert to 150 one puff    twice a day. 6. Albuterol MDI as needed. 7. Niferex Forte 1 a day. 8. Tussionex 1 teaspoon every 4 hours. 9. Nicoderm patches.  FOLLOWUP:  With Dr. Alwyn Ren in one week. Dictated by:   Stacie Glaze, M.D.  [facility] Attending Physician:  Dolores Patty DD:  09/06/01 TD:  09/06/01 Job: [id number]

## 2011-04-21 NOTE — H&P (Signed)
[facility]  Patient:    Sarah Escobar, Sarah Escobar Visit Number: [id number] MRN: [id number]          Service Type: MED Location: 3W 0372 01 Attending Physician:  Dolores Patty Dictated by:   Titus Dubin. Alwyn Ren, M.D. [facility] Admit Date:  09/03/2001   CC:         Titus Dubin. Alwyn Ren, M.D. [facility], [place] [facility] [place]   History and Physical  HISTORY OF PRESENT ILLNESS:  Ms. Sheu is a 55 year old white female admitted with COPD exacerbation in the setting of continued tobacco use.  She was seen initially August 30, 2001 in followup from an urgent care visit.  She had been given Augmentin and a prednisone Dosepak but had shown no improvement subjectively.  She was told to return for chest x-ray if she did not improve but came to my office instead.  She was describing a cough which was for the most part nonproductive but associated with shortness of breath and substernal chest pain with the cough.  She was also experiencing diaphoresis and chills but no fever.  Her chest pain was described as in the right inframammary area and sharp, worse with inspiration or cough.  She was using her albuterol metered-dose inhaler, two puffs four times a day.  The pleuritic component on the right was minimal and different from the substernal discomfort with cough.  She exhibited a temperature of 99.1 at that time.  She appeared mildly ill. She had no increased work of breathing but diffuse inspiratory pops and wheezes which were symmetric.  Early clubbing was suggested; she had no cyanosis.  Chest x-ray revealed heavy lung markings generally.  It was recommended that she change the Augmentin to Avelox and samples to start the therapy were provided.  The Azmacort she was taking was changed to Advair 500/50, 1 inhalation every 12 hours, to be followed by gargling to prevent powder buildup in the posterior pharynx.  The risk of continued smoking was discussed, as she was smoking  a half a pack at this time.  She returned acutely September 03, 2001.  She had finished her Augmentin and just started her Avelox rather than switching as recommended.  She described chills and sweats but no fever.  The cough again was nonproductive.  She was experiencing paroxysmal nocturnal dyspnea.  She denied any pleuritic pain at this time.  She was experiencing some heartburn and she also questioned tarry stool.  In the office, she was given a nebulizer treatment with Xopenex 1.25 mg. Prior to the treatment, her O2 saturations were 91%; following the treatment, they were 93%.  Her pulse ranged from 89 to 91 with the treatment.  Because of the protracted nature of her illness and the ongoing symptoms despite the aggressive outpatient therapy which included steroids and the fact that she said she had not smoked since August 30, 2001, admission was recommended for parenteral therapy.  PAST MEDICAL HISTORY:  Her past medical history includes outpatient treatment of pneumonia in 1993.  She has had cervical spine surgery.  She has had laser surgery for endometriosis by Dr. Miguel Aschoff.  She has had sinus surgery on three occasions.  FAMILY HISTORY:  Family history is positive for colon cancer in her father. He also had a myocardial infarction and a stroke.  Mother had colitis, asthma and cirrhosis.  Maternal grandmother and maternal aunt had asthma.  ALLERGIES:  She is intolerant or allergic to SULFA, questionably FLOXIN, NEOSPORIN, OXYCODONE, IMODIUM AD.  MEDICATIONS:  1. Guaifenesin 600 mg two twice a day. 2. Albuterol inhalations as noted. 3. She had been on Azmacort and Serevent prior to initiation of Advair. 4. She was also on Tilade two inhalations four times a day and Flonase    nasal.  REVIEW OF SYSTEMS:  Review of systems includes chronic low back syndrome.  She admits to anxiety.  There are no significant extrinsic rhino-conjunctivitis symptoms.  She denies any  anginal-type equivalent.  She has no genitourinary symptoms.  PHYSICAL EXAMINATION:  GENERAL:  On exam, she appeared acutely uncomfortable.  VITAL SIGNS:  Temperature was 98.8.  Pulse 90 with respiratory variation. Respiratory rate was 24 to 28.  Blood pressure was 112/60.  HEENT:  She had slight arteriolar narrowing on fundal exam.  Tympanic membranes were dull.  There was erythema of the oropharynx with shallow ulcers posteriorly.  She was markedly hoarse.  She has an upper dental plate.  The lower teeth are tobacco stained.  The rhonchi are ______ over the posterior pharynx.  LUNGS:  She had diffuse hard wheezing in all lung fields despite the nebulized treatment.  CARDIAC:  She had an S4 tachycardia with a grade 1/2 systolic murmur.  All pulses intact and there were no bruits.  She had no peripheral edema.  GENITAL, RECTAL AND BREASTS:  Exams were deferred as they are not germane to this admission.  EXTREMITIES:  She does have early clubbing, more noticeable in the toes.  SKIN:  Her complexion was sallow without cyanosis.  NEUROLOGIC:  She was oriented x 3.  Obviously, there is a question of judgment and insight, as she has continued to smoke despite advanced lung disease.  ASSESSMENT:  She is now admitted with the diagnosis of chronic obstructive pulmonary disease exacerbation with an asthmatic component in the setting of nicotine abuse.  There is also some question of melena.  She will be admitted for parenteral antibiotics and steroids.  Fecal occult blood testing will be performed. Dictated by:   Titus Dubin. Alwyn Ren, M.D. [facility] Attending Physician:  Dolores Patty DD:  09/04/01 TD:  09/04/01 Job: [id number]

## 2011-04-21 NOTE — Discharge Summary (Signed)
[facility]  Patient:    Sarah Escobar, Sarah Escobar Visit Number: [id number] MRN: [id number]          Service Type: GYN Location: 9300 [id number] Attending Physician:  Miguel Aschoff Dictated by:   Miguel Aschoff, M.D. Admit Date:  11/13/2001 Discharge Date: 11/15/2001                             Discharge Summary  ADMISSION DIAGNOSIS:          Chronic pelvic pain.  OPERATIONS AND PROCEDURES:    1. Total abdominal hysterectomy.                               2. Bilateral salpingo-oophorectomy.  HISTORY OF PRESENT ILLNESS:   The patient is a 55 year old white female with history of persistent pelvic pain associated with increasing dysmenorrhea and dyspareunia.  The patient had been previously laparoscoped and was found to have endometriosis.  This was treated laparoscopically, however, because of the persistence and increase in her symptomatology, she requested that a definitive procedure be carried out to relieve her pelvic pain.  She was, therefore, admitted to the hospital at this time, to undergo total abdominal hysterectomy and bilateral salpingo-oophorectomy.  LABORATORY/ACCESSORY DATA:    Preoperative studies were obtained.  This included an admission hemoglobin of 13.3, WBC of 7300.  Chemistry profile was done and was within normal limits.  PT and PTT were within normal limits, as was her urinalysis.  HOSPITAL COURSE:              Under epidural anesthesia on November 13, 2001, a total abdominal hysterectomy and bilateral salpingo-oophorectomy were carried out without difficulty.  Grossly, there was no residual endometriosis noted.  The surgery was done without difficulty and the patients postoperative course was essentially uncomplicated.  She tolerated increased ambulation and diet well.  The only problem was an inability to void, which required her to have her Foley catheter reinserted.  This was removed and on the third postoperative day, she was able to  be discharged home in satisfactory condition.  Her medications for home included Demerol 50 mg every 3 hours as needed for pain, Premarin 0.25 mg one daily.  She was instructed to do no heavy lifting, place nothing in the vagina, to call if there were any problems such as fever, pain or heavy bleeding.  Again, the patient was to be seen back in the office in four weeks for her follow-up examination.  The final diagnosis on her pathology report revealed squamous metaplasia of the cervix, proliferative endometrium.  The myometrium revealed adenomyosis.  The ovaries and tubes revealed hemorrhagic follicular cysts. Dictated by:   Miguel Aschoff, M.D. Attending Physician:  Miguel Aschoff DD:  12/15/01 TD:  12/15/01 Job: [id number]

## 2011-04-21 NOTE — Assessment & Plan Note (Signed)
[facility]                        [place] OFFICE NOTE   OLAR, SANTINI                       MRN:          [id number]  DATE:06/05/2007                            DOB:          [birth year]    Ms. Booz has notified me that her insurance company is declining to pay  for the NMR LipoProfile performed March 29, 2007. This note is to  document indications for this study.   Worrisome is her family history of coronary artery disease in her father  at age 70.  Although Ms. Waggoner has stopped smoking, her LDL or bad  cholesterol has varied from 134  in June 2008 after intensive diet &  exercise protocol; in  September 2007 it  had been 191. The LipoProfile  was done to assess residual dietary components and overall genetic risk;  a routine lipid profile could not provide these data.  I did not feel  that empiric initiation of a statin should be implemented until risk  could be assessed.  She has a history of drug intolerances or actual  allergic reaction to 6 different drugs.  On at least 2 occasions she has  had frank rash with  prescribed medications.   Despite the intensive interventions on her part, in April the NMR  LipoProfile revealed an LDL of 127, composed of approximately 1800 total  particles and approximately 1400 small dense atherogenic particles .This  pattern would be associated with  a risk of premature cardiovascular  event of  15% to 20%.   Based on this compelling data, she will be asked to return to discuss  drug management of this significant cardiovascular risk.   I am requesting that the insurance company cover this for the above  reasons.  Literature reveals that the routine panel is grossly  inadequate in optimally assessing risk for cardiovascular events,  especially in women.  The NMR LipoProfile technology is being employed  by the [employer]  for these reasons.     Titus Dubin. Alwyn Ren,  MD,FACP,FCCP  Electronically Signed    [facility]  DD: 06/05/2007  DT: 06/05/2007  Job #: [id number]   cc:   Ms. Duwayne Heck

## 2011-04-21 NOTE — Op Note (Signed)
[facility]  Patient:    Sarah Escobar, Sarah Escobar Visit Number: [id number] MRN: [id number]          Service Type: GYN Location: 9300 [id number] Attending Physician:  Miguel Aschoff Dictated by:   Miguel Aschoff, M.D. Proc. Date: 11/13/01 Admit Date:  11/13/2001                             Operative Report  PREOPERATIVE DIAGNOSES:       1. Chronic pelvic pain.                               2. Dyspareunia.                               3. Endometriosis.  POSTOPERATIVE DIAGNOSES:      1. Chronic pelvic pain.                               2. Dyspareunia.                               3. Endometriosis.                               4. Pelvic adhesions.  OPERATION:                    Total abdominal hysterectomy and bilateral                               salpingo-oophorectomy  SURGEON:                      Miguel Aschoff, M.D.  ASSISTANT:                    Luvenia Redden, M.D.  ANESTHESIA:                   Epidural.  COMPLICATIONS:                None.  INDICATIONS:                  The patient is a 55 year old white female with history of persistent pelvic pain.  The patient had previously been laparoscoped in 1999.  At that time, was noted to have endometriosis and underwent ablation of endometriosis and uterosacral nerve ablation.  Her pain has now returned and she presents now to undergo definitive procedure of total abdominal hysterectomy and bilateral salpingo-oophorectomy in an effort to correct the pain.  The risks and benefits have been discussed with the patient and informed consent has been obtained.  DESCRIPTION OF PROCEDURE:     The patient was brought to the operating room and placed in a sitting position, and the epidural anesthetic was placed without difficulty.  After a satisfactory level of anesthesia was achieved, she was placed in the supine position, prepped and draped in the usual sterile fashion.  A Foley catheter was inserted.  Once this  was done a Pfannenstiel incision was made and extended down through the subcutaneous tissue with bleeding points  being clamped and coagulated as they were encountered.  The fascia was then identified and incised transversely.  It was then separated from the underlying rectus muscles.  The rectus muscles were divided in the midline.  The peritoneum was then identified and entered carefully avoiding underlying structures.  A self-retaining retractor was then placed through the wound and _________ were packed out of the pelvis.  The appendix appeared to be normal.  The uterus was noted to be small.  On the posterior surface, there were two implants suggestive of persistent endometriosis and no other gross signs of endometriosis were noted.  There were some adhesions of the omentum to the posterior surface of the uterus.  These were taken down sharply without problems.  The ovaries appeared to be within normal limits as were the tubes.  At this point, the round ligaments were identified, clamped, cut, and suture ligated using suture ligatures of 0 Vicryl.  The bladder flap was then created anteriorly.  Then, the broad ligament was skeletonized.  A perforation was made below the utero-ovarian ligament and then the ureter was identified as was the infundibulopelvic ligament.  The infundibulopelvic ligaments were clamped bilaterally.  Pedicles were cut and these pedicles were ligated using two ligatures of 0 plain gut.  The broad ligament was further skeletonized and then curved Heaney clamps are placed across the uterine vessels.  These pedicles were cut and suture ligated using suture ligatures of 0 Vicryl. Then, serial bites were taken at the paracervical fascia using straight Heaney clamps.  All pedicles were cut and suture ligated using suture ligatures of 0 Vicryl.  The uterosacral ligaments and cardinal ligaments were then identified, clamped, cut, and suture ligated in a similar  fashion.  At this point, the reflection of the vagina to the cervix was found.  Curved Heaney clamps were placed across this reflection.  Pedicles were then cut and suture ligated using suture ligatures of 0 Vicryl.  These pedicles were held, and now with the vagina being entered, it was possible to cut the cervix free from the vaginal fornices.  The specimen was then handed off the table.  The specimen including the cervix, uterus, tubes, and ovaries.  At this point, the angles of the vaginal cuff were ligated using figure-of-eight sutures of 0 Vicryl with the uterosacral ligaments being incorporated for support.  Then, the cuff was run using running interlocking 0 Vicryl suture and then approximated in the midline using two figure-of-eight sutures of 0 Vicryl.  At this point, the pelvis was irrigated with warm saline.  Inspection was made for hemostasis and hemostasis appeared to be excellent.  At this point, the lap counts and instrument counts were taken and found to be correct and the abdomen was closed.  The parietal peritoneum was closed using running continuous 0 Vicryl suture.  The rectus muscles were reapproximated using running continuous 0 Vicryl suture.  The fascia was closed using two sutures of 0 Vicryl, each starting at the lateral fascial end and meeting in the midline.  The subcutaneous tissue and skin were closed using staples.  The estimated blood loss was less than 200 cc.  The patient tolerated the procedure well and went to the recovery room in satisfactory condition. Dictated by:   Miguel Aschoff, M.D. Attending Physician:  Miguel Aschoff DD:  11/13/01 TD:  11/13/01 Job: [id number]

## 2011-05-03 ENCOUNTER — Other Ambulatory Visit: Payer: Self-pay

## 2011-05-03 MED ORDER — BUPROPION HCL ER (XL) 300 MG PO TB24: 300.0000 mg | ORAL_TABLET | ORAL | Status: DC

## 2011-05-03 MED ORDER — FLUTICASONE PROPIONATE 50 MCG/ACT NA SUSP: 1.0000 | Freq: Every day | NASAL | Status: DC

## 2011-05-09 ENCOUNTER — Other Ambulatory Visit: Payer: Self-pay

## 2011-05-09 MED ORDER — FLUTICASONE-SALMETEROL 250-50 MCG/DOSE IN AEPB: 1.0000 | INHALATION_SPRAY | Freq: Two times a day (BID) | RESPIRATORY_TRACT | Status: DC

## 2011-05-09 MED ORDER — FLUTICASONE PROPIONATE 50 MCG/ACT NA SUSP: 1.0000 | Freq: Every day | NASAL | Status: DC

## 2011-05-09 MED ORDER — TIOTROPIUM BROMIDE MONOHYDRATE 18 MCG IN CAPS: 18.0000 ug | ORAL_CAPSULE | Freq: Every day | RESPIRATORY_TRACT | Status: DC

## 2011-05-09 MED ORDER — METOPROLOL TARTRATE 25 MG PO TABS: ORAL_TABLET | ORAL | Status: DC

## 2011-05-09 MED ORDER — IPRATROPIUM-ALBUTEROL 18-103 MCG/ACT IN AERO: 2.0000 | INHALATION_SPRAY | Freq: Four times a day (QID) | RESPIRATORY_TRACT | Status: DC | PRN

## 2011-05-22 ENCOUNTER — Other Ambulatory Visit: Payer: Self-pay | Admitting: Internal Medicine

## 2011-05-22 DIAGNOSIS — T887XXA Unspecified adverse effect of drug or medicament, initial encounter: Secondary | ICD-10-CM

## 2011-05-22 DIAGNOSIS — M899 Disorder of bone, unspecified: Secondary | ICD-10-CM

## 2011-05-22 DIAGNOSIS — Z0000 Encounter for general adult medical examination without abnormal findings: Secondary | ICD-10-CM

## 2011-05-22 DIAGNOSIS — E785 Hyperlipidemia, unspecified: Secondary | ICD-10-CM

## 2011-05-22 DIAGNOSIS — I10 Essential (primary) hypertension: Secondary | ICD-10-CM

## 2011-05-23 ENCOUNTER — Other Ambulatory Visit (INDEPENDENT_AMBULATORY_CARE_PROVIDER_SITE_OTHER): Payer: BC Managed Care – PPO

## 2011-05-23 DIAGNOSIS — M899 Disorder of bone, unspecified: Secondary | ICD-10-CM

## 2011-05-23 DIAGNOSIS — I10 Essential (primary) hypertension: Secondary | ICD-10-CM

## 2011-05-23 DIAGNOSIS — T887XXA Unspecified adverse effect of drug or medicament, initial encounter: Secondary | ICD-10-CM

## 2011-05-23 DIAGNOSIS — E785 Hyperlipidemia, unspecified: Secondary | ICD-10-CM

## 2011-05-23 DIAGNOSIS — Z0000 Encounter for general adult medical examination without abnormal findings: Secondary | ICD-10-CM

## 2011-05-23 LAB — BASIC METABOLIC PANEL
BUN: 7 mg/dL (ref 6–23)
CO2: 30 mEq/L (ref 19–32)
Calcium: 9.3 mg/dL (ref 8.4–10.5)
Creatinine, Ser: 0.8 mg/dL (ref 0.4–1.2)
Glucose, Bld: 79 mg/dL (ref 70–99)
Sodium: 139 mEq/L (ref 135–145)

## 2011-05-23 LAB — CBC WITH DIFFERENTIAL/PLATELET
Basophils Absolute: 0 10*3/uL (ref 0.0–0.1)
Eosinophils Absolute: 0.2 10*3/uL (ref 0.0–0.7)
Hemoglobin: 13.8 g/dL (ref 12.0–15.0)
Lymphocytes Relative: 42.5 % (ref 12.0–46.0)
Lymphs Abs: 3.6 10*3/uL (ref 0.7–4.0)
MCHC: 34.6 g/dL (ref 30.0–36.0)
Neutro Abs: 4.1 10*3/uL (ref 1.4–7.7)
RDW: 13.5 % (ref 11.5–14.6)

## 2011-05-23 LAB — LIPID PANEL: Total CHOL/HDL Ratio: 3

## 2011-05-23 LAB — HEPATIC FUNCTION PANEL
AST: 27 U/L (ref 0–37)
Alkaline Phosphatase: 64 U/L (ref 39–117)
Bilirubin, Direct: 0.1 mg/dL (ref 0.0–0.3)
Total Bilirubin: 0.5 mg/dL (ref 0.3–1.2)

## 2011-05-23 NOTE — Progress Notes (Signed)
Labs only

## 2011-05-30 ENCOUNTER — Encounter: Payer: Self-pay | Admitting: Internal Medicine

## 2011-05-30 ENCOUNTER — Ambulatory Visit (INDEPENDENT_AMBULATORY_CARE_PROVIDER_SITE_OTHER): Payer: BC Managed Care – PPO | Admitting: Internal Medicine

## 2011-05-30 DIAGNOSIS — M899 Disorder of bone, unspecified: Secondary | ICD-10-CM

## 2011-05-30 DIAGNOSIS — I10 Essential (primary) hypertension: Secondary | ICD-10-CM

## 2011-05-30 DIAGNOSIS — E782 Mixed hyperlipidemia: Secondary | ICD-10-CM

## 2011-05-30 DIAGNOSIS — J45909 Unspecified asthma, uncomplicated: Secondary | ICD-10-CM

## 2011-05-30 DIAGNOSIS — R143 Flatulence: Secondary | ICD-10-CM

## 2011-05-30 DIAGNOSIS — Z0000 Encounter for general adult medical examination without abnormal findings: Secondary | ICD-10-CM

## 2011-05-30 DIAGNOSIS — M949 Disorder of cartilage, unspecified: Secondary | ICD-10-CM

## 2011-05-30 DIAGNOSIS — R141 Gas pain: Secondary | ICD-10-CM

## 2011-05-30 DIAGNOSIS — Z86010 Personal history of colonic polyps: Secondary | ICD-10-CM

## 2011-05-30 MED ORDER — FLUTICASONE PROPIONATE 50 MCG/ACT NA SUSP: 1.0000 | Freq: Every day | NASAL | Status: DC

## 2011-05-30 MED ORDER — METOPROLOL TARTRATE 25 MG PO TABS: ORAL_TABLET | ORAL | Status: DC

## 2011-05-30 MED ORDER — BUDESONIDE-FORMOTEROL FUMARATE 160-4.5 MCG/ACT IN AERO: 2.0000 | INHALATION_SPRAY | Freq: Two times a day (BID) | RESPIRATORY_TRACT | Status: DC

## 2011-05-30 MED ORDER — SIMVASTATIN 20 MG PO TABS: 20.0000 mg | ORAL_TABLET | Freq: Every evening | ORAL | Status: DC

## 2011-05-30 MED ORDER — TIOTROPIUM BROMIDE MONOHYDRATE 18 MCG IN CAPS: 18.0000 ug | ORAL_CAPSULE | Freq: Every day | RESPIRATORY_TRACT | Status: DC

## 2011-05-30 MED ORDER — BUPROPION HCL ER (XL) 300 MG PO TB24: 300.0000 mg | ORAL_TABLET | ORAL | Status: DC

## 2011-05-30 NOTE — Progress Notes (Signed)
Addended by: Doristine Devoid on: 05/30/2011 02:08 PM   Modules accepted: Orders

## 2011-05-30 NOTE — Patient Instructions (Addendum)
Preventive Health Care: Exercise  30-45  minutes a day, 3-4 days a week. Walking is especially valuable in preventing Osteoporosis. Eat a low-fat diet with lots of fruits and vegetables, up to 7-9 servings per day.Consume less than 30 grams of sugar per day from foods & drinks with High Fructose Corn Syrup as #2,3 or #4 on label. Health Care Power of Attorney & Living Will place you in charge of your health care  decisions. Verify these are  in place. Please  schedule fasting Labs : Lipids, hepatic panel after 10 weeks of Simvastatin 20 mg @ bedtime.

## 2011-05-30 NOTE — Progress Notes (Signed)
Subjective:    Patient ID: Sarah Escobar, female    DOB: 05/07/1956, 55 y.o.   MRN: [id number]  HPI  Sarah Escobar  is here for a physical; her asthma flared this Spring despite Advair, Spiriva & Combivent. . She was treated with Prednisone @ [facility].      Review of Systems Patient reports no vision/ hearing  changes, adenopathy,fever, weight change,  persistent hoarseness , swallowing issues, chest pain,palpitations,edema, hemoptysis, dyspnea rest /paroxysmal nocturnal), gastrointestinal bleeding(melena, rectal bleeding), abdominal pain, significant heartburn,  bowel changes,GU symptoms(dysuria, hematuria,pyuria, incontinence ), Gyn symptoms(abnormal  bleeding , pain),  syncope, focal weakness, memory loss,numbness & tingling, abnormal bruising or bleeding, or uncontrolled anxiety,or depression.  Lesion L ear several months. Asthma triggers include heat, dust, smoke  & fumes     Objective:   Physical Exam Gen.: Healthy and well-nourished in appearance. Alert, appropriate and cooperative throughout exam. Head: Normocephalic without obvious abnormalities  Eyes: No corneal or conjunctival inflammation noted. Pupils equal round reactive to light and accommodation. Fundal exam is benign without hemorrhages, exudate, papilledema. Extraocular motion intact. Vision grossly normal. Ears: External  ear exam reveals no significant lesions or deformities. Canals clear .TMs normal. Hearing is grossly slightly reduced to whisper, L > R. Nose: External nasal exam reveals no deformity or inflammation. Nasal mucosa are pink and moist. No lesions or exudates noted.  Mouth: Oral mucosa and oropharynx reveal no lesions or exudates. Upper plate Neck: No deformities, masses, or tenderness noted. Range of motion &. Thyroid normal. Lungs: Normal respiratory effort; chest expands symmetrically. Lungs : without rales; scattered isolated inspiratory pops/ wheezes w/o  increased work of breathing. Heart: Normal rate and rhythm.  Normal S1 and S2. No gallop, click, or rub. No murmur. Abdomen: Bowel sounds normal; abdomen soft and nontender. No masses, organomegaly or hernias noted. Genitalia: Dr Sarah Escobar.                                                                                      Musculoskeletal/extremities: No deformity or scoliosis noted of  the thoracic or lumbar spine. No clubbing, cyanosis, edema, or deformity noted. Range of motion  normal .Tone & strength  normal.Joints normal. Nail health  good. Vascular: Carotid, radial artery, dorsalis pedis and dorsalis posterior tibial pulses are full and equal. No bruits present. Neurologic: Alert and oriented x3. Deep tendon reflexes symmetrical and normal.          Skin:"pearly" , papular lesion L external ear Lymph: No cervical, axillary, or inguinal lymphadenopathy present. Psych: Mood and affect are normal. Normally interactive                                                                                         Assessment & Plan:  #1 comprehensive physical exam; no acute findings #2 see Problem List  with Assessments & Recommendations #3 asthma, suboptimal control #4 ear lesion Plan: see Orders

## 2011-06-01 ENCOUNTER — Other Ambulatory Visit: Payer: Self-pay | Admitting: Internal Medicine

## 2011-06-01 DIAGNOSIS — J45909 Unspecified asthma, uncomplicated: Secondary | ICD-10-CM

## 2011-06-01 MED ORDER — IPRATROPIUM-ALBUTEROL 18-103 MCG/ACT IN AERO: 2.0000 | INHALATION_SPRAY | Freq: Four times a day (QID) | RESPIRATORY_TRACT | Status: DC | PRN

## 2011-06-01 MED ORDER — BUDESONIDE-FORMOTEROL FUMARATE 160-4.5 MCG/ACT IN AERO: 2.0000 | INHALATION_SPRAY | Freq: Two times a day (BID) | RESPIRATORY_TRACT | Status: DC

## 2011-06-02 ENCOUNTER — Other Ambulatory Visit: Payer: Self-pay | Admitting: *Deleted

## 2011-06-02 DIAGNOSIS — E782 Mixed hyperlipidemia: Secondary | ICD-10-CM

## 2011-06-02 MED ORDER — SIMVASTATIN 20 MG PO TABS: 20.0000 mg | ORAL_TABLET | Freq: Every evening | ORAL | Status: DC

## 2011-06-02 MED ORDER — FLUTICASONE PROPIONATE 50 MCG/ACT NA SUSP: 1.0000 | Freq: Every day | NASAL | Status: DC

## 2011-06-05 ENCOUNTER — Telehealth: Payer: Self-pay | Admitting: *Deleted

## 2011-06-05 NOTE — Telephone Encounter (Signed)
Spoke with [facility] [employer] does not support a claim for mail order. Pt can get Rx at local pharmacy with no PA required. Left message to call office to inform Pt and to verify a local pharmacy.

## 2011-06-06 ENCOUNTER — Other Ambulatory Visit: Payer: Self-pay | Admitting: Dermatology

## 2011-06-06 NOTE — Telephone Encounter (Signed)
Left message to call office with Pt husband.

## 2011-06-09 NOTE — Telephone Encounter (Signed)
Pt aware that medication isn't covered by pharmacy.

## 2011-06-16 ENCOUNTER — Other Ambulatory Visit: Payer: Self-pay | Admitting: Internal Medicine

## 2011-06-19 ENCOUNTER — Other Ambulatory Visit: Payer: Self-pay | Admitting: *Deleted

## 2011-06-19 NOTE — Telephone Encounter (Signed)
Fluticasone generic Flonase is the least expensive  & most effective of the nasal steroids. She can check with her company to see if they do have a preferred nasal steroid which can be filled.  Inform her that insurance company is not allowing the nebulized albuterol. The metered-dose inhaler can  be refilled as noted on her present medicine list.

## 2011-06-19 NOTE — Telephone Encounter (Signed)
Patient left message noting that [facility] denies receiving refill of Albuterol ampules. She is also asking about alternative for Flonase. Please advise.

## 2011-06-20 ENCOUNTER — Telehealth: Payer: Self-pay | Admitting: *Deleted

## 2011-06-20 DIAGNOSIS — Z9109 Other allergy status, other than to drugs and biological substances: Secondary | ICD-10-CM | POA: Insufficient documentation

## 2011-06-20 MED ORDER — ALBUTEROL SULFATE (2.5 MG/3ML) 0.083% IN NEBU: 2.5000 mg | INHALATION_SOLUTION | RESPIRATORY_TRACT | Status: DC | PRN

## 2011-06-20 NOTE — Telephone Encounter (Signed)
Pt left message noting that she has spoken with her ins co about generic Flonase. She notes that they are faxing prior auth to our office and Dr. Alwyn Ren should note that this is a "maintainence drug used for allergies". Pt would like for pa to be done today.

## 2011-06-20 NOTE — Telephone Encounter (Signed)
Pt notified and states that albuterol is covered because her co-workers is also taking it. Pt was under the impression that after prior auth Flonase would be covered. I made her aware of previous notification that Flonase is not covered. Pt will call her insurance to discuss Flonase and Albuterol ampules.

## 2011-06-20 NOTE — Telephone Encounter (Addendum)
Form completed and faxed. Lmovm notifying pt.

## 2011-06-21 ENCOUNTER — Other Ambulatory Visit: Payer: Self-pay | Admitting: *Deleted

## 2011-06-21 MED ORDER — FLUTICASONE PROPIONATE 50 MCG/ACT NA SUSP: 2.0000 | Freq: Every day | NASAL | Status: DC

## 2011-06-21 NOTE — Telephone Encounter (Addendum)
Fax form received from [employer] that No prior authorization is required for this drug. Per [employer] it is not a maintenance medication and is not available through mail order. Member must obtain monthly through local retail pharmacy.  Pt notified.

## 2011-06-21 NOTE — Telephone Encounter (Signed)
Pt aware rx was sent.

## 2011-07-11 ENCOUNTER — Other Ambulatory Visit: Payer: Self-pay | Admitting: Obstetrics and Gynecology

## 2011-08-04 ENCOUNTER — Other Ambulatory Visit: Payer: Self-pay | Admitting: Internal Medicine

## 2011-09-21 ENCOUNTER — Other Ambulatory Visit: Payer: Self-pay | Admitting: Internal Medicine

## 2011-09-29 ENCOUNTER — Other Ambulatory Visit: Payer: Self-pay | Admitting: Internal Medicine

## 2011-09-29 DIAGNOSIS — E785 Hyperlipidemia, unspecified: Secondary | ICD-10-CM

## 2011-10-02 ENCOUNTER — Other Ambulatory Visit (INDEPENDENT_AMBULATORY_CARE_PROVIDER_SITE_OTHER): Payer: BC Managed Care – PPO

## 2011-10-02 DIAGNOSIS — E785 Hyperlipidemia, unspecified: Secondary | ICD-10-CM

## 2011-10-02 LAB — HEPATIC FUNCTION PANEL
Alkaline Phosphatase: 60 U/L (ref 39–117)
Bilirubin, Direct: 0 mg/dL (ref 0.0–0.3)
Total Bilirubin: 0.3 mg/dL (ref 0.3–1.2)
Total Protein: 6.7 g/dL (ref 6.0–8.3)

## 2011-10-02 LAB — LIPID PANEL
Cholesterol: 127 mg/dL (ref 0–200)
LDL Cholesterol: 68 mg/dL (ref 0–99)
Total CHOL/HDL Ratio: 3

## 2011-10-02 NOTE — Progress Notes (Signed)
Labs only

## 2011-10-17 ENCOUNTER — Other Ambulatory Visit: Payer: Self-pay | Admitting: Internal Medicine

## 2011-10-17 MED ORDER — ALBUTEROL SULFATE (2.5 MG/3ML) 0.083% IN NEBU: 2.5000 mg | INHALATION_SOLUTION | RESPIRATORY_TRACT | Status: DC | PRN

## 2011-10-17 NOTE — Telephone Encounter (Signed)
RX sent

## 2011-12-05 HISTORY — PX: COLONOSCOPY: SHX174

## 2011-12-29 ENCOUNTER — Encounter: Payer: Self-pay | Admitting: Internal Medicine

## 2012-01-03 ENCOUNTER — Other Ambulatory Visit: Payer: Self-pay | Admitting: Internal Medicine

## 2012-01-03 DIAGNOSIS — R7401 Elevation of levels of liver transaminase levels: Secondary | ICD-10-CM

## 2012-01-04 ENCOUNTER — Other Ambulatory Visit (INDEPENDENT_AMBULATORY_CARE_PROVIDER_SITE_OTHER): Payer: BC Managed Care – PPO

## 2012-01-04 DIAGNOSIS — R7401 Elevation of levels of liver transaminase levels: Secondary | ICD-10-CM

## 2012-02-08 ENCOUNTER — Other Ambulatory Visit: Payer: Self-pay | Admitting: Internal Medicine

## 2012-02-08 NOTE — Telephone Encounter (Signed)
Prescription sent to pharmacy.

## 2012-02-19 ENCOUNTER — Telehealth: Payer: Self-pay

## 2012-02-19 DIAGNOSIS — J45909 Unspecified asthma, uncomplicated: Secondary | ICD-10-CM

## 2012-02-19 NOTE — Telephone Encounter (Signed)
Dr.Hopper please advise 

## 2012-02-19 NOTE — Telephone Encounter (Signed)
Advair one inhalation every 12 hours OK as long as not out of date; gargle and spit after use

## 2012-02-19 NOTE — Telephone Encounter (Signed)
Discuss with patient  

## 2012-02-19 NOTE — Telephone Encounter (Signed)
Call from patient an she stated she was currently on Advair but has been off of it for 6 months now but she had 3 inhalers left and wanted to know if she could use them up before getting her Symbicort refilled.  If not she can not use the Advair she will need her Symbicort sent to the mail order pharmacy. Please call patient at work and make her aware.    KP

## 2012-04-30 ENCOUNTER — Telehealth: Payer: Self-pay | Admitting: Internal Medicine

## 2012-04-30 NOTE — Telephone Encounter (Signed)
Patient states she needs new rx sent to [facility] (fax# [phone]) for Advair. Pt states her id# [id number], address, and date of birth need to be included when this is sent over.

## 2012-05-02 ENCOUNTER — Other Ambulatory Visit: Payer: Self-pay | Admitting: Internal Medicine

## 2012-05-02 MED ORDER — FLUTICASONE-SALMETEROL 250-50 MCG/DOSE IN AEPB: 1.0000 | INHALATION_SPRAY | Freq: Two times a day (BID) | RESPIRATORY_TRACT | Status: DC

## 2012-05-02 NOTE — Telephone Encounter (Signed)
Dr.Hopper please advise for Advair is not on patient's medication list, patient is using several other inhalers

## 2012-05-02 NOTE — Telephone Encounter (Signed)
Left message on voicemail for patient to call and discuss

## 2012-05-02 NOTE — Telephone Encounter (Signed)
Spoke with patient, patient states she stopped symbicort and would like a rx for advair, rx sent.

## 2012-05-02 NOTE — Telephone Encounter (Signed)
Advair OR Symbicort should be used but not both. Please clarify which maintenance agent she is using & refill x3 months

## 2012-05-14 ENCOUNTER — Other Ambulatory Visit: Payer: Self-pay | Admitting: Internal Medicine

## 2012-05-14 DIAGNOSIS — E782 Mixed hyperlipidemia: Secondary | ICD-10-CM

## 2012-05-14 MED ORDER — SIMVASTATIN 20 MG PO TABS: 20.0000 mg | ORAL_TABLET | Freq: Every evening | ORAL | Status: DC

## 2012-05-14 NOTE — Telephone Encounter (Signed)
Caller: Janavia/Mother; Phone Number: [phone]; Message from caller: Mail order pharmacy has told her that the Combivent inhaler she uses has been discontinued.  She also needs a refill of Simvastatin to mail order company [facility].  Fax number is [phone].  ID# is [id number].

## 2012-05-14 NOTE — Telephone Encounter (Signed)
OK # [phone] 

## 2012-05-14 NOTE — Telephone Encounter (Signed)
Refill done.  

## 2012-06-21 ENCOUNTER — Telehealth: Payer: Self-pay | Admitting: Internal Medicine

## 2012-06-21 NOTE — Telephone Encounter (Signed)
Patient called stating she would like Dr. Alwyn Ren to write a letter to her employer explaining that she is extremely sensitive to fragrances and can have an allergic reaction. She wants her employer to be aware of her sensitivity and for her atmosphere to be fragrance-free. Fax letter to [phone] attn Deshanta Geist

## 2012-06-22 NOTE — Telephone Encounter (Signed)
She has not been seen since May 30, 2011. She is due for reevaluation. The letter can be completed at that appointment

## 2012-06-24 ENCOUNTER — Other Ambulatory Visit: Payer: Self-pay | Admitting: Internal Medicine

## 2012-06-24 NOTE — Telephone Encounter (Signed)
Left message to call office

## 2012-06-25 NOTE — Telephone Encounter (Signed)
Discuss with patient will schedule OV.

## 2012-07-01 ENCOUNTER — Other Ambulatory Visit: Payer: Self-pay | Admitting: Internal Medicine

## 2012-07-02 ENCOUNTER — Encounter: Payer: Self-pay | Admitting: Internal Medicine

## 2012-07-02 ENCOUNTER — Ambulatory Visit (INDEPENDENT_AMBULATORY_CARE_PROVIDER_SITE_OTHER)
Admission: RE | Admit: 2012-07-02 | Discharge: 2012-07-02 | Disposition: A | Discharge status: Home / Self Care | Payer: BC Managed Care – PPO | Source: Ambulatory Visit | Attending: Internal Medicine | Admitting: Internal Medicine

## 2012-07-02 ENCOUNTER — Ambulatory Visit (INDEPENDENT_AMBULATORY_CARE_PROVIDER_SITE_OTHER): Payer: BC Managed Care – PPO | Admitting: Internal Medicine

## 2012-07-02 VITALS — BP 112/78 | HR 72 | Temp 98.2°F | Wt 139.6 lb

## 2012-07-02 DIAGNOSIS — J45909 Unspecified asthma, uncomplicated: Secondary | ICD-10-CM

## 2012-07-02 DIAGNOSIS — J42 Unspecified chronic bronchitis: Secondary | ICD-10-CM

## 2012-07-02 NOTE — Patient Instructions (Addendum)
Order for x-rays entered into  the computer; these will be performed at [address] [facility]. across from [facility]. No appointment is necessary.  Please try to go on My Chart within the next 24 hours to allow me to release the results directly to you.  Share record with  Human Resources to support request for asthma trigger avoidance

## 2012-07-02 NOTE — Progress Notes (Signed)
  Subjective:    Patient ID: Sarah Escobar, female    DOB: 07-Sep-1956, 56 y.o.   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 Perfume is a significant trigger for her asthma (see updated Problem List). When she is exposed to the perfume; she does develop head congestion and headache with exacerbation of her reactive airways disease.  Despite using maintenance Advair250/50 and Spiriva; she continues to require rescue inhaler used 1-2 times per day. She also uses fluticasone nasal spray twice a day    Review of Systems  She does have chronic, daily sputum production of yellow-green up to 1 tablespoon / day. She denies fever, chills, or sweats.     Objective:   Physical Exam General appearance:good health ;well nourished; no acute distress or increased work of breathing is present.  No  lymphadenopathy about the head, neck, or axilla noted.   Eyes: No conjunctival inflammation or lid edema is present.   Ears:  External ear exam shows no significant lesions or deformities.  Otoscopic examination reveals clear canals, tympanic membranes are intact bilaterally without bulging, retraction, inflammation or discharge.Some TM scarring  Nose:  External nasal examination shows no deformity or inflammation. Nasal mucosa are pink and moist without lesions or exudates. No septal dislocation or deviation.No obstruction to airflow.   Oral exam: Lower partial & upper plate; lips and gums are healthy appearing.There is no oropharyngeal erythema or exudate noted.    Heart:  Normal rate and regular rhythm. S1 and S2 normal without gallop, murmur, click, rub or other extra sounds.   Lungs:Chest clear to auscultation; no wheezes, rhonchi,rales ,or rubs present.No increased work of breathing.    Extremities:  No cyanosis, edema, or clubbing  noted    Skin: Warm & dry          Assessment & Plan:  #1 asthma, chronic.  Triggers include fumes. There is potential for such exposure @ a new work station  #2 chronic bronchitis suggested by daily sputum production of significance  Plan: She'll be asked to work with [employer] at her workplace to eliminate the triggers for her asthma

## 2012-07-15 ENCOUNTER — Other Ambulatory Visit: Payer: Self-pay | Admitting: Internal Medicine

## 2012-08-27 ENCOUNTER — Other Ambulatory Visit: Payer: Self-pay | Admitting: Internal Medicine

## 2012-08-29 ENCOUNTER — Encounter: Payer: BC Managed Care – PPO | Admitting: Internal Medicine

## 2012-08-29 ENCOUNTER — Other Ambulatory Visit: Payer: Self-pay | Admitting: Internal Medicine

## 2012-08-29 DIAGNOSIS — E782 Mixed hyperlipidemia: Secondary | ICD-10-CM

## 2012-08-29 MED ORDER — SIMVASTATIN 20 MG PO TABS: 20.0000 mg | ORAL_TABLET | Freq: Every evening | ORAL | Status: DC

## 2012-08-29 NOTE — Telephone Encounter (Signed)
Per call from pt 944am - needs refill Simvastatin (Tab) ZOCOR 20 MG Take 1 tablet (20 mg total) by mouth every evening needs #90-day supply for insurnance per pt rx has expired & pharmacy requires new RX be sent Pt has CPE sch. For 11.25.13 @ 1:30pm

## 2012-08-29 NOTE — Telephone Encounter (Signed)
Patient with pending appointment in November, RX sent

## 2012-09-09 ENCOUNTER — Encounter: Payer: Self-pay | Admitting: Internal Medicine

## 2012-09-13 ENCOUNTER — Encounter: Payer: Self-pay | Admitting: Internal Medicine

## 2012-09-13 ENCOUNTER — Ambulatory Visit (INDEPENDENT_AMBULATORY_CARE_PROVIDER_SITE_OTHER): Payer: BC Managed Care – PPO | Admitting: Internal Medicine

## 2012-09-13 VITALS — BP 126/80 | HR 76 | Temp 98.1°F | Resp 16 | Wt 138.5 lb

## 2012-09-13 DIAGNOSIS — J0100 Acute maxillary sinusitis, unspecified: Secondary | ICD-10-CM

## 2012-09-13 MED ORDER — METRONIDAZOLE 500 MG PO TABS: ORAL_TABLET | ORAL | Status: DC

## 2012-09-13 NOTE — Patient Instructions (Addendum)
Plain Mucinex for thick secretions ;force NON dairy fluids . Use a Neti pot daily as needed for sinus congestion; going from open side to congested side . Nasal cleansing in the shower as discussed. Make sure that all residual soap is removed to prevent irritation. Fluticasone 1 spray in each nostril twice a day as needed. Use the "crossover" technique as discussed. Zyrtec @ bedtime as needed for itchy eyes & sneezing.

## 2012-09-13 NOTE — Progress Notes (Signed)
  Subjective:    Patient ID: Sarah Escobar, female    DOB: [birth year], 56 y.o.   MRN: [id number]  HPI 3 weeks ago she completed a ten-day course of Augmentin 875 mg, 2 pills twice a day. She was essentially asymptomatic for 2 weeks; but last week she began to have green nasal discharge associated with chills and facial pain w/o fever  She has treated her symptoms with nasal gavage, nonsteroidals, hydration, and rest    Review of Systems She did have some erythema of the upper lids and some sneezing;but she denies itchy, watery eyes. She has had some sputum but she relates this to postnasal drainage. She's had some wheezing w/o shortness of breath as well.  She's had some intermittent ear pain without discharge  She denies any diarrhea despite the high dose Augmentin     Objective:   Physical Exam General appearance:good health ;well nourished; no acute distress or increased work of breathing is present.  No  lymphadenopathy about the head, neck, or axilla noted.   Eyes: No conjunctival inflammation or lid edema is present.   Ears:  External ear exam shows no significant lesions or deformities.  Otoscopic examination reveals clear canals, tympanic membranes are intact bilaterally without bulging, retraction, inflammation or discharge.  Nose:  External nasal examination shows no deformity or inflammation. Nasal mucosa erythematous on R without lesions or exudates. No septal dislocation or deviation.No obstruction to airflow.   Oral exam: Upper plate & lower partial; lips and gums are healthy appearing.There is no oropharyngeal erythema or exudate noted.   Neck:  No deformities,  masses, or tenderness noted.      Heart:  Normal rate and regular rhythm. S1 and S2 normal without gallop, murmur, click, rub or other extra sounds.   Lungs:.She exhibits isolated inspiratory pops and minimal wheeze on the right. No increased work of breathing.    Extremities:  No cyanosis, edema, or clubbing   noted    Skin: Warm & dry          Assessment & Plan:  #1 maxillary sinusitis despite 10 days of very high Augmentin  Plan: See orders and recommendations

## 2012-09-17 ENCOUNTER — Telehealth: Payer: Self-pay

## 2012-09-17 MED ORDER — DOXYCYCLINE HYCLATE 100 MG PO TABS: 100.0000 mg | ORAL_TABLET | Freq: Two times a day (BID) | ORAL | Status: DC

## 2012-09-17 NOTE — Telephone Encounter (Signed)
Left message advising pt stop antibiotics and pick up new Rx from pharmacy.    MW

## 2012-09-17 NOTE — Telephone Encounter (Signed)
Pt left message on triage line stating she came in 09/12/12 for sinus infection, Rx for antibiotics is causing her discomfort - LL back pain and can't sleep. Pt ask can you send different Rx to help her or can she just stop meds? Plz advise   MW

## 2012-09-17 NOTE — Telephone Encounter (Signed)
Doxycycline 100 mg twice a day, dispense 14. Stop metronidazole

## 2012-09-18 ENCOUNTER — Encounter: Payer: Self-pay | Admitting: Internal Medicine

## 2012-09-19 ENCOUNTER — Telehealth: Payer: Self-pay

## 2012-09-19 NOTE — Telephone Encounter (Signed)
Pt has gone to UC. Instructed her if she needs anything to let us know.

## 2012-09-19 NOTE — Telephone Encounter (Signed)
Needs to go to the UC or ER tonight

## 2012-09-19 NOTE — Telephone Encounter (Signed)
Pt states still having pain mostly muscle spasms because of old antibiotic. Pain in leg not in back anymore. You Rx a new antibiotic but pt states scared to take cause she is in so much pain. Pt ask can you Rx something for pain and should she start new antbiotic? [facility] on [address] is the pharmacy pt prefers for this Rx  Plz advise   MW

## 2012-09-19 NOTE — Telephone Encounter (Signed)
Please advise 

## 2012-09-19 NOTE — Telephone Encounter (Signed)
Spoke wt/pt advised no appts today offered to use a same day slot for paz at 1130 tomorrow Pt started crying stated she would go to UC as she could not go thru another night like this.  Pt stated she understood but she could not wait til tomorrow. I advised pt if she got no relief to call me in the morning and I would put her in at 11:30am on paz schedule

## 2012-09-19 NOTE — Telephone Encounter (Signed)
Flagyl was discontinued because back pain, Dr. Alfonse Flavors recommended doxycycline. I recommend: 1. Doxycycline for sinusitis 2. Needs to be seen, unclear to me why he has leg pain.(here or in [place])

## 2012-09-19 NOTE — Telephone Encounter (Signed)
Called pt home pt husband stated call pt at work. Called pt work no answer left message on her direct line advising her she need to schedule appt can't send Rx without OV.    MW

## 2012-09-23 ENCOUNTER — Telehealth: Payer: Self-pay

## 2012-09-23 NOTE — Telephone Encounter (Signed)
She should employ the medications prescribed at the urgent care as they have evaluated her most recently. Followup is indicated if her symptoms persist or progress despite these therapies

## 2012-09-23 NOTE — Telephone Encounter (Signed)
Pt states visited UC 09/19/12  They Rx prednozone dose pack, vicodin for pain. Pt asked does she need to see you or continue Rx from UC before she comes in? Pt states Vicodin helps but she has to take it continuely for relief. Pt also states advil and tylenol did nothing for pain relief.  Plz advise   MW  [phone]

## 2012-09-23 NOTE — Telephone Encounter (Signed)
Left message on voicemail for patient to return call when available   

## 2012-09-24 NOTE — Telephone Encounter (Signed)
pt returned your call please call back at work# [phone] ext [phone]

## 2012-09-24 NOTE — Telephone Encounter (Signed)
Spoke with patient, patient states she is still taking medications and is improving  Patient scheduled appointment for Thursday (may call and cancel if better)

## 2012-09-26 ENCOUNTER — Ambulatory Visit (INDEPENDENT_AMBULATORY_CARE_PROVIDER_SITE_OTHER): Payer: BC Managed Care – PPO | Admitting: Internal Medicine

## 2012-09-26 VITALS — BP 118/72 | HR 79 | Temp 97.8°F | Wt 136.0 lb

## 2012-09-26 DIAGNOSIS — M545 Low back pain, unspecified: Secondary | ICD-10-CM

## 2012-09-26 DIAGNOSIS — M899 Disorder of bone, unspecified: Secondary | ICD-10-CM

## 2012-09-26 DIAGNOSIS — M949 Disorder of cartilage, unspecified: Secondary | ICD-10-CM

## 2012-09-26 MED ORDER — CYCLOBENZAPRINE HCL 5 MG PO TABS: ORAL_TABLET | ORAL | Status: DC

## 2012-09-26 MED ORDER — HYDROCODONE-ACETAMINOPHEN 5-500 MG PO TABS: 1.0000 | ORAL_TABLET | Freq: Four times a day (QID) | ORAL | Status: DC | PRN

## 2012-09-26 NOTE — Progress Notes (Signed)
  Subjective:    Patient ID: Sarah Escobar, female    DOB: Sep 13, 1956, 56 y.o.   MRN: [id number]  HPI BACK PAIN Onset: 09/13/12 Injury Minerva Fester: no Location: L LS area Radiation: to L anterior thigh Quality: dull sitting; sharp in am & with mobilization  Worse with:activity & standing up  Better with: Vicodin from UC 10/17     Review of Systems Fecal/urinary incontinence: no Numbness/Tingling/Weakness: no  Fever/chills/sweats:no Dysuria/Pyuria/Hematuria:no Unexplained weight loss: no Relief with bedrest:yes   PMH of Osteopenia  & chronic steroid use  She questioned whether her present clinical picture were related to Flagyl. She only took 3 of the pills.         Objective:   Physical Exam Gen.: Mildly uncomfortable but  well-nourished in appearance. Alert, appropriate and cooperative throughout exam.  Abdomen:  abdomen soft and nontender. No masses, organomegaly or hernias noted.                                                                                 Musculoskeletal/extremities: No deformity or scoliosis noted of  the thoracic or lumbar spine; there is minimal asymmetry of the lower paraspinous muscles. No clubbing, cyanosis, edema, or deformity noted. Range of motion  normal .Tone & strength  normal.Joints normal. Nail health  good. She is able to lie flat and sit up without help. Straight leg raising is negative bilaterally Neurologic: Alert and oriented x3. Deep tendon reflexes symmetrical and normal. Gait including heel and tiptoe walking are normal       Skin: Intact without suspicious lesions or rashes.  Psych: Mood and affect are normal. Normally interactive                                                                                         Assessment & Plan:  #1 acute low back syndrome; clinically no evidence of disc  #2 osteopenia; bone mineral density study due  Plan: See orders and recommendations

## 2012-09-26 NOTE — Patient Instructions (Addendum)
Chiropractory or physical therapy recommended if symptoms persist

## 2012-10-04 ENCOUNTER — Other Ambulatory Visit: Payer: Self-pay

## 2012-10-04 DIAGNOSIS — M545 Low back pain, unspecified: Secondary | ICD-10-CM

## 2012-10-04 MED ORDER — CYCLOBENZAPRINE HCL 5 MG PO TABS: ORAL_TABLET | ORAL | Status: DC

## 2012-10-04 MED ORDER — HYDROCODONE-ACETAMINOPHEN 5-500 MG PO TABS: 1.0000 | ORAL_TABLET | Freq: Four times a day (QID) | ORAL | Status: DC | PRN

## 2012-10-04 NOTE — Telephone Encounter (Signed)
Both OK but  I recommend physical therapy or chiropractry  referral if symptoms persist or progress.

## 2012-10-04 NOTE — Telephone Encounter (Signed)
OV and last filled 09/26/12 Vicodin #30 no refill, Flexeril 09/26/12 #14 no refill. Pt states she is trying to see another doctor  And you said it was okay needs enough med until she can.  Plz advise   MW

## 2012-10-21 ENCOUNTER — Other Ambulatory Visit: Payer: Self-pay | Admitting: Internal Medicine

## 2012-10-28 ENCOUNTER — Encounter: Payer: Self-pay | Admitting: Internal Medicine

## 2012-10-28 ENCOUNTER — Ambulatory Visit (INDEPENDENT_AMBULATORY_CARE_PROVIDER_SITE_OTHER): Payer: BC Managed Care – PPO | Admitting: Internal Medicine

## 2012-10-28 VITALS — BP 116/80 | HR 108 | Resp 14 | Ht 67.03 in | Wt 133.6 lb

## 2012-10-28 DIAGNOSIS — I10 Essential (primary) hypertension: Secondary | ICD-10-CM

## 2012-10-28 DIAGNOSIS — M899 Disorder of bone, unspecified: Secondary | ICD-10-CM

## 2012-10-28 DIAGNOSIS — E785 Hyperlipidemia, unspecified: Secondary | ICD-10-CM

## 2012-10-28 DIAGNOSIS — M949 Disorder of cartilage, unspecified: Secondary | ICD-10-CM

## 2012-10-28 DIAGNOSIS — Z0000 Encounter for general adult medical examination without abnormal findings: Secondary | ICD-10-CM

## 2012-10-28 NOTE — Progress Notes (Signed)
  Subjective:    Patient ID: Sarah Escobar, female    DOB: October 31, 1956, 56 y.o.   MRN: [id number]  HPI  Rease is here for a physical;acute issues include "sciatica"      Review of Systems  Hyperlipidemia: Medication compliance:yes  Hypertension: Disease monitoring:  Blood pressure range/average:no monitor Medication compliance:yes  Diet/nutrition:no plan Exercise:walking occasioanlly Salt restriction:no  Chest pain, palpitations:no Dyspnea:no Lightheadedness, syncope:occasional lightheadedness Edema:no Claudication:no Blurred vision/diplopia/loss of vision:no Skin lesions:no Numbness/tingling/pain and extremities:no Abdominal pain/bowel change:no Myalgias: no but radicular pain in L3 dermatome being treated by Dr Farris Has. S/P steroid & NSAIDs injections     Objective:   Physical Exam Gen.:  well-nourished in appearance. Alert, appropriate and cooperative throughout exam. Head: Normocephalic without obvious abnormalities  Eyes: No corneal or conjunctival inflammation noted. Pupils equal round reactive to light and accommodation. Fundal exam is benign without hemorrhages, exudate, papilledema. Extraocular motion intact. Vision grossly normal. Ears: External  ear exam reveals no significant lesions or deformities. Canals clear .TMs normal. Hearing is grossly normal bilaterally. Nose: External nasal exam reveals no deformity or inflammation. Nasal mucosa are pink and moist. No lesions or exudates noted.  Mouth: Oral mucosa and oropharynx reveal no lesions or exudates. Upper plate & lower partial in good repair. Neck: No deformities, masses, or tenderness noted. Range of motion & Thyroid normal. Lungs: Normal respiratory effort; chest expands symmetrically. Lungs are clear to auscultation without rales, wheezes, or increased work of breathing. Heart: Normal rate and rhythm. Normal S1 and S2. No gallop, click, or rub. S4 w/o murmur. Abdomen: Bowel sounds normal; abdomen soft and  nontender. No masses, organomegaly or hernias noted. Genitalia: Dr Tenny Craw, Gyn                                  Musculoskeletal/extremities: No deformity or scoliosis noted of  the thoracic or lumbar spine. No clubbing, cyanosis, edema, or deformity noted. Range of motion  normal .Tone & strength  normal.Joints normal. Nail health  good. Vascular: Carotid, radial artery, dorsalis pedis and  posterior tibial pulses are full and equal. No bruits present. Neurologic: Alert and oriented x3. Deep tendon reflexes symmetrical and normal.          Skin: Intact without suspicious lesions or rashes. Lymph: No cervical, axillary lymphadenopathy present. Psych: Mood and affect are normal. Normally interactive                                                                                       Assessment & Plan:  #1 comprehensive physical exam; no acute findings #2 ? L 3 radiculopathy Plan: see Orders

## 2012-10-28 NOTE — Patient Instructions (Addendum)
Review and correct the record as indicated. Please share record with all medical staff seen.  Exercise  30-45  minutes a day, 3-4 days a week. Walking is especially valuable in preventing Osteoporosis. Eat a low-fat diet with lots of fruits and vegetables, up to 7-9 servings per day.  Consume less than 30 grams of sugar per day from foods & drinks with High Fructose Corn Syrup as #1,2,3 or #4 on label. Health Care Power of Attorney & Living Will place you in charge of your health care  decisions. Verify these are  in place. If you activate My Chart; the results can be released to you as soon as they populate from the lab. If you choose not to use this program; the labs have to be reviewed, copied & mailed   causing a delay in getting the results to you.

## 2012-10-29 LAB — BASIC METABOLIC PANEL
Calcium: 9.4 mg/dL (ref 8.4–10.5)
Creatinine, Ser: 0.8 mg/dL (ref 0.4–1.2)
GFR: 78.67 mL/min (ref >60.00)
Sodium: 135 mEq/L (ref 135–145)

## 2012-10-29 LAB — CBC WITH DIFFERENTIAL/PLATELET
Basophils Relative: 0.7 % (ref 0.0–3.0)
Eosinophils Absolute: 0.1 10*3/uL (ref 0.0–0.7)
Hemoglobin: 14.1 g/dL (ref 12.0–15.0)
Lymphocytes Relative: 31.7 % (ref 12.0–46.0)
Monocytes Relative: 5.6 % (ref 3.0–12.0)
Neutro Abs: 4.7 10*3/uL (ref 1.4–7.7)
Neutrophils Relative %: 61 % (ref 43.0–77.0)
RBC: 4.31 Mil/uL (ref 3.87–5.11)
WBC: 7.7 10*3/uL (ref 4.5–10.5)

## 2012-10-29 LAB — LIPID PANEL
HDL: 48.3 mg/dL (ref >39.00)
Total CHOL/HDL Ratio: 3
VLDL: 30.8 mg/dL (ref 0.0–40.0)

## 2012-10-29 LAB — HEPATIC FUNCTION PANEL
AST: 36 U/L (ref 0–37)
Albumin: 4 g/dL (ref 3.5–5.2)
Alkaline Phosphatase: 65 U/L (ref 39–117)
Bilirubin, Direct: 0.1 mg/dL (ref 0.0–0.3)
Total Protein: 6.9 g/dL (ref 6.0–8.3)

## 2012-10-29 LAB — TSH: TSH: 1.71 u[IU]/mL (ref 0.35–5.50)

## 2012-11-01 LAB — VITAMIN D 1,25 DIHYDROXY: Vitamin D 1, 25 (OH)2 Total: 27 pg/mL (ref 18–72)

## 2012-11-05 ENCOUNTER — Ambulatory Visit (AMBULATORY_SURGERY_CENTER): Payer: BC Managed Care – PPO

## 2012-11-05 VITALS — Ht 66.0 in | Wt 135.2 lb

## 2012-11-05 DIAGNOSIS — Z1211 Encounter for screening for malignant neoplasm of colon: Secondary | ICD-10-CM

## 2012-11-05 DIAGNOSIS — Z86010 Personal history of colonic polyps: Secondary | ICD-10-CM

## 2012-11-05 DIAGNOSIS — Z800 Family history of malignant neoplasm of digestive organs: Secondary | ICD-10-CM

## 2012-11-05 MED ORDER — NA SULFATE-K SULFATE-MG SULF 17.5-3.13-1.6 GM/177ML PO SOLN: 1.0000 | Freq: Once | ORAL | Status: DC

## 2012-11-06 ENCOUNTER — Encounter: Payer: Self-pay | Admitting: Internal Medicine

## 2012-11-08 ENCOUNTER — Ambulatory Visit (INDEPENDENT_AMBULATORY_CARE_PROVIDER_SITE_OTHER)
Admission: RE | Admit: 2012-11-08 | Discharge: 2012-11-08 | Disposition: A | Discharge status: Home / Self Care | Payer: BC Managed Care – PPO | Source: Ambulatory Visit

## 2012-11-08 DIAGNOSIS — M949 Disorder of cartilage, unspecified: Secondary | ICD-10-CM

## 2012-11-08 DIAGNOSIS — M899 Disorder of bone, unspecified: Secondary | ICD-10-CM

## 2012-11-19 ENCOUNTER — Encounter: Payer: BC Managed Care – PPO | Admitting: Internal Medicine

## 2012-11-19 ENCOUNTER — Ambulatory Visit (AMBULATORY_SURGERY_CENTER): Payer: BC Managed Care – PPO | Admitting: Internal Medicine

## 2012-11-19 ENCOUNTER — Encounter: Payer: Self-pay | Admitting: Internal Medicine

## 2012-11-19 VITALS — BP 130/79 | HR 71 | Temp 97.7°F | Resp 14 | Ht 66.0 in | Wt 135.0 lb

## 2012-11-19 DIAGNOSIS — Z860100 Personal history of colon polyps, unspecified: Secondary | ICD-10-CM

## 2012-11-19 DIAGNOSIS — Z800 Family history of malignant neoplasm of digestive organs: Secondary | ICD-10-CM

## 2012-11-19 DIAGNOSIS — D126 Benign neoplasm of colon, unspecified: Secondary | ICD-10-CM

## 2012-11-19 DIAGNOSIS — K644 Residual hemorrhoidal skin tags: Secondary | ICD-10-CM

## 2012-11-19 DIAGNOSIS — Z1211 Encounter for screening for malignant neoplasm of colon: Secondary | ICD-10-CM

## 2012-11-19 MED ORDER — SODIUM CHLORIDE 0.9 % IV SOLN: 500.0000 mL | INTRAVENOUS | Status: DC

## 2012-11-19 NOTE — Progress Notes (Signed)
No complaints noted in the recovery room. Maw   

## 2012-11-19 NOTE — Progress Notes (Signed)
Propofol given over incremental dosages 

## 2012-11-19 NOTE — Progress Notes (Signed)
Patient did not experience any of the following events: a burn prior to discharge; a fall within the facility; wrong site/side/patient/procedure/implant event; or a hospital transfer or hospital admission upon discharge from the facility. (G8907) Patient did not have preoperative order for IV antibiotic SSI prophylaxis. (G8918)  

## 2012-11-19 NOTE — Op Note (Addendum)
[facility] [address] [place], [address]   COLONOSCOPY PROCEDURE REPORT  PATIENT: Sarah Escobar, Sarah Escobar  MR#: [id number] BIRTHDATE: [birth year] , 56  yrs. old GENDER: Female ENDOSCOPIST: Iva Boop, MD, [facility] PROCEDURE DATE:  11/19/2012 PROCEDURE:   Colonoscopy with snare polypectomy and Colonoscopy with biopsy ASA CLASS:   Class II INDICATIONS:Screening and surveillance,personal history of colonic polyps and Patient's personal history of adenomatous colon polyps. Family hx colon cancer sister and father(50's and 107's) MEDICATIONS: MAC sedation, administered by CRNA, These medications were titrated to patient response per physician's verbal order, and propofol (Diprivan) 250mg  IV  DESCRIPTION OF PROCEDURE:   After the risks benefits and alternatives of the procedure were thoroughly explained, informed consent was obtained.  A digital rectal exam revealed no abnormalities of the rectum.   The LB [id number]-[id number]  endoscope was introduced through the anus and advanced to the cecum, which was identified by both the appendix and ileocecal valve. No adverse events experienced.   The quality of the prep was Suprep excellent The instrument was then slowly withdrawn as the colon was fully examined.      COLON FINDINGS: A smooth sessile polyp measuring 3 mm in size was found at the cecum.  A polypectomy was performed with cold forceps. The resection was complete and the polyp tissue was completely retrieved.   A polypoid shaped pedunculated polyp measuring 10 mm in size was found in the sigmoid colon.  A polypectomy was performed using snare cautery.  The resection was complete and the polyp tissue was completely retrieved.   Small external hemorrhoids were found.   The colon mucosa was otherwise normal.  Retroflexed views revealed external hemorrhoids. The time to cecum=4 minutes 17 seconds.  Withdrawal time=16 minutes 15 seconds.  The scope was withdrawn and  the procedure completed. COMPLICATIONS: There were no complications.  ENDOSCOPIC IMPRESSION: 1.   Sessile polyp measuring 3 mm in size was found at the cecum; polypectomy was performed with cold forceps 2.   Pedunculated polyp measuring 10 mm in size was found in the sigmoid colon; polypectomy was performed using snare cautery 3.   Small external hemorrhoids 4.   The colon mucosa was otherwise normal - excellent prep- personal hx adenomas since 2007, FHx CRCA  RECOMMENDATIONS: 1.  Hold aspirin, aspirin products, and anti-inflammatory medication for 2 weeks. 2.  Timing of repeat colonoscopy will be determined by pathology findings.eSigned:  Iva Boop, MD, [facility] 11/19/2012 10:22 AMRevised: 11/19/2012 10:22 AM cc: The Patient

## 2012-11-19 NOTE — Progress Notes (Signed)
Called to room to assist during endoscopic procedure.  Patient ID and intended procedure confirmed with present staff. Received instructions for my participation in the procedure from the performing physician.  

## 2012-11-19 NOTE — Patient Instructions (Addendum)
Two polyps were removed - once again they look benign but if left in could potentially become cancer. I will let you know pathology results but suspect I will have you return in 3 years.  You also have small hemorrhoids.  Thank you for choosing me and [facility].  Iva Boop, MD, [facility]   Handouts were given to your care partner on polyps, hemorrhoid and high fiber diet.  Per Dr. Leone Payor please hold aspirin, aspirin products and any anti-inflammatory medications for 2 weeks.  You may resume your other current medications today.  Please call if any questions or concerns.   YOU HAD AN ENDOSCOPIC PROCEDURE TODAY AT THE  ENDOSCOPY CENTER: Refer to the procedure report that was given to you for any specific questions about what was found during the examination.  If the procedure report does not answer your questions, please call your gastroenterologist to clarify.  If you requested that your care partner not be given the details of your procedure findings, then the procedure report has been included in a sealed envelope for you to review at your convenience later.  YOU SHOULD EXPECT: Some feelings of bloating in the abdomen. Passage of more gas than usual.  Walking can help get rid of the air that was put into your GI tract during the procedure and reduce the bloating. If you had a lower endoscopy (such as a colonoscopy or flexible sigmoidoscopy) you may notice spotting of blood in your stool or on the toilet paper. If you underwent a bowel prep for your procedure, then you may not have a normal bowel movement for a few days.  DIET: Your first meal following the procedure should be a light meal and then it is ok to progress to your normal diet.  A half-sandwich or bowl of soup is an example of a good first meal.  Heavy or fried foods are harder to digest and may make you feel nauseous or bloated.  Likewise meals heavy in dairy and vegetables can cause extra gas to form and this  can also increase the bloating.  Drink plenty of fluids but you should avoid alcoholic beverages for 24 hours.  ACTIVITY: Your care partner should take you home directly after the procedure.  You should plan to take it easy, moving slowly for the rest of the day.  You can resume normal activity the day after the procedure however you should NOT DRIVE or use heavy machinery for 24 hours (because of the sedation medicines used during the test).    SYMPTOMS TO REPORT IMMEDIATELY: A gastroenterologist can be reached at any hour.  During normal business hours, 8:30 AM to 5:00 PM Monday through Friday, call [phone].  After hours and on weekends, please call the GI answering service at [phone] who will take a message and have the physician on call contact you.   Following lower endoscopy (colonoscopy or flexible sigmoidoscopy):  Excessive amounts of blood in the stool  Significant tenderness or worsening of abdominal pains  Swelling of the abdomen that is new, acute  Fever of 100F or higher    FOLLOW UP: If any biopsies were taken you will be contacted by phone or by letter within the next 1-3 weeks.  Call your gastroenterologist if you have not heard about the biopsies in 3 weeks.  Our staff will call the home number listed on your records the next business day following your procedure to check on you and address any questions or concerns  that you may have at that time regarding the information given to you following your procedure. This is a courtesy call and so if there is no answer at the home number and we have not heard from you through the emergency physician on call, we will assume that you have returned to your regular daily activities without incident.  SIGNATURES/CONFIDENTIALITY: You and/or your care partner have signed paperwork which will be entered into your electronic medical record.  These signatures attest to the fact that that the information above on your After Visit  Summary has been reviewed and is understood.  Full responsibility of the confidentiality of this discharge information lies with you and/or your care-partner.

## 2012-11-20 ENCOUNTER — Telehealth: Payer: Self-pay

## 2012-11-20 NOTE — Telephone Encounter (Signed)
  Follow up Call-  Call back number 11/19/2012  Post procedure Call Back phone  # [phone]  Permission to leave phone message Yes     Patient questions:  Do you have a fever, pain , or abdominal swelling? no Pain Score  0 *  Have you tolerated food without any problems? yes  Have you been able to return to your normal activities? yes  Do you have any questions about your discharge instructions: Diet   no Medications  no Follow up visit  no  Do you have questions or concerns about your Care? no  Actions: * If pain score is 4 or above: No action needed, pain <4.  The pt went to work this am and had no problems per her husband. Maw

## 2012-11-26 ENCOUNTER — Encounter: Payer: Self-pay | Admitting: Internal Medicine

## 2012-11-26 NOTE — Progress Notes (Signed)
Quick Note:  Repeat colon 11/2015 10 mm adenoma here and FHx CRCA ______

## 2012-12-02 ENCOUNTER — Other Ambulatory Visit (INDEPENDENT_AMBULATORY_CARE_PROVIDER_SITE_OTHER): Payer: BC Managed Care – PPO

## 2012-12-02 DIAGNOSIS — N390 Urinary tract infection, site not specified: Secondary | ICD-10-CM

## 2012-12-02 LAB — POCT URINALYSIS DIPSTICK
Bilirubin, UA: NEGATIVE
Blood, UA: NEGATIVE
Ketones, UA: NEGATIVE
Protein, UA: NEGATIVE
pH, UA: 7.5

## 2012-12-05 ENCOUNTER — Other Ambulatory Visit: Payer: Self-pay | Admitting: Internal Medicine

## 2012-12-05 ENCOUNTER — Telehealth: Payer: Self-pay | Admitting: *Deleted

## 2012-12-05 DIAGNOSIS — N390 Urinary tract infection, site not specified: Secondary | ICD-10-CM

## 2012-12-05 LAB — URINE CULTURE

## 2012-12-05 MED ORDER — NITROFURANTOIN MONOHYD MACRO 100 MG PO CAPS: 100.0000 mg | ORAL_CAPSULE | Freq: Two times a day (BID) | ORAL | Status: DC

## 2012-12-05 NOTE — Telephone Encounter (Signed)
Pt left VM stating that she would like to get result of urine..Left message to call office to advise Pt of the following:   Entered by Pecola Lawless, MD at 12/05/2012 4:29 PM As there are more than 100,000 colonies of a single bacteria; a true urinary tract infection is present and the full course of antibiotics should be completed. Nitrofurantoin should eradicate this bacteria. You can not take sulfa or Levaquin. Rx will be sent to pharmacy. [employer]

## 2012-12-06 MED ORDER — NITROFURANTOIN MONOHYD MACRO 100 MG PO CAPS: 100.0000 mg | ORAL_CAPSULE | Freq: Two times a day (BID) | ORAL | Status: DC

## 2012-12-06 NOTE — Telephone Encounter (Signed)
Discuss with patient, Rx sent. 

## 2012-12-08 ENCOUNTER — Other Ambulatory Visit: Payer: Self-pay | Admitting: Internal Medicine

## 2012-12-19 ENCOUNTER — Other Ambulatory Visit: Payer: Self-pay | Admitting: Internal Medicine

## 2012-12-23 ENCOUNTER — Encounter: Payer: Self-pay | Admitting: Lab

## 2012-12-24 ENCOUNTER — Ambulatory Visit (INDEPENDENT_AMBULATORY_CARE_PROVIDER_SITE_OTHER): Payer: BC Managed Care – PPO | Admitting: Internal Medicine

## 2012-12-24 ENCOUNTER — Encounter: Payer: Self-pay | Admitting: Internal Medicine

## 2012-12-24 VITALS — BP 124/70 | HR 71 | Temp 98.1°F | Resp 13 | Wt 134.0 lb

## 2012-12-24 DIAGNOSIS — IMO0002 Reserved for concepts with insufficient information to code with codable children: Secondary | ICD-10-CM

## 2012-12-24 DIAGNOSIS — M5416 Radiculopathy, lumbar region: Secondary | ICD-10-CM

## 2012-12-24 DIAGNOSIS — R109 Unspecified abdominal pain: Secondary | ICD-10-CM

## 2012-12-24 DIAGNOSIS — M5137 Other intervertebral disc degeneration, lumbosacral region: Secondary | ICD-10-CM | POA: Insufficient documentation

## 2012-12-24 DIAGNOSIS — M51379 Other intervertebral disc degeneration, lumbosacral region without mention of lumbar back pain or lower extremity pain: Secondary | ICD-10-CM | POA: Insufficient documentation

## 2012-12-24 LAB — POCT URINALYSIS DIPSTICK
Ketones, UA: NEGATIVE
Leukocytes, UA: NEGATIVE
Nitrite, UA: NEGATIVE
Protein, UA: NEGATIVE
Urobilinogen, UA: NEGATIVE

## 2012-12-24 NOTE — Patient Instructions (Addendum)
Increase roughage (fruits and vegetables) and your diet as recommended. Drink to thirst up to 32 ounces of fluids a day.Daily  Metamucil or other such agents may help the constipation. Take MiraLax every third night as needed for constipation.   If you activate My Chart; the results can be released to you as soon as they populate from the lab. If you choose not to use this program; the labs have to be reviewed, copied & mailed   causing a delay in getting the results to you.  Share results with all non  medical staff seen

## 2012-12-24 NOTE — Progress Notes (Signed)
  Subjective:    Patient ID: Sarah Escobar, female    DOB: 04-Nov-1956, 57 y.o.   MRN: [id number]  HPI Pre op evaluation: Surgical diagnosis:ruptured disc @ L1-2 Tentative surgical date/Surgeon:12/27/12/  ESI by Dr Maurice Small Onset: 9/13 Character : L LS area with radiation to L knee Severity: up to 6 Pain: sharp Activity of daily living limitation/impairment of function:difficulty sitting @ work Treatment to date, efficacy: Flexeril qhs & Vicodin every 6 hrs  with partial benefit     Review of Systems A separate issue is abdominal pain began 12/22/12 in the LLQ area; it was described as cramping and non-radiating. Severity was up to a 4  level ; the discomfort lasted up to one hour. It was not impacted by bladder or  bowel function. Standing or supine position may help.No  self treatment for this except Vicodin for back .  Exacerbating factors include "quick movement" such as twisting or waist flexion.  Some nausea with anorexia  for 1 week but improving.No vomiting,  diarrhea, mela, or rectal bleeding were not described. There was no associated dyspepsia, dysphagia,  or hematemesis.Constipation due to Vicodin  ?low grade fever w/o chills,or  Sweats. Associated 5# weight loss were not present.  Dysuria, polyuria, and hematuria were absent  There  was no associated rash . This pain is not  radicular pain from the back.  Past medical history of polyps 2013.There is  family history colitis & colon cancer.      Objective:   Physical Exam General appearance is one of good health and nourishment w/o distress.  Eyes: No conjunctival inflammation or scleral icterus is present.  Oral exam: Dental hygiene is good;lower partial & upper denture lips and gums are healthy appearing.There is no oropharyngeal erythema or exudate noted.   Heart:  Normal rate and regular rhythm. S1 and S2 normal without gallop, murmur, click, rub or other extra sounds     Lungs:Chest clear to auscultation; no  wheezes, rhonchi,rales ,or rubs present.No increased work of breathing. Decreased BS  Abdomen: bowel sounds normal, soft but slightly tender LLQ without masses, organomegaly or hernias noted.  No guarding or rebound . Some tenderness to percussion over the left flank  Skin:Warm & dry.  Intact without suspicious lesions or rashes ; no jaundice or tenting  Lymphatic: No lymphadenopathy is noted about the head, neck, axilla  Spine shows no malalignment. She is able to lie flat and sit up without help.  Straight leg raising is limited but negative to 45. Strength, tone, deep tendon reflexes are normal                                       Assessment & Plan:   #1 disc fragment at L1-2 with L1 nerve root encroachment  #2 left lower old discomfort in the context of probable recent viral gastroenteritis and constipation related to narcotic use. Family history of colitis &  colon cancer. Her colonoscopy is up-to-date  Plan: Urine will be checked; there is no contraindication to the planned a epidural steroid injection. Intervention to prevent constipation due to narcotics  is extremely important

## 2012-12-24 NOTE — Addendum Note (Signed)
Addended by: Maurice Small on: 12/24/2012 10:42 AM   Modules accepted: Orders

## 2012-12-26 LAB — URINE CULTURE: Colony Count: NO GROWTH

## 2013-01-31 ENCOUNTER — Other Ambulatory Visit: Payer: Self-pay | Admitting: *Deleted

## 2013-01-31 ENCOUNTER — Other Ambulatory Visit: Payer: Self-pay | Admitting: Internal Medicine

## 2013-01-31 MED ORDER — BUPROPION HCL ER (XL) 300 MG PO TB24: ORAL_TABLET | ORAL | Status: DC

## 2013-01-31 NOTE — Telephone Encounter (Signed)
Rx sent 

## 2013-02-03 ENCOUNTER — Other Ambulatory Visit: Payer: Self-pay | Admitting: Internal Medicine

## 2013-02-17 ENCOUNTER — Other Ambulatory Visit: Payer: Self-pay | Admitting: Internal Medicine

## 2013-02-17 ENCOUNTER — Other Ambulatory Visit: Payer: Self-pay | Admitting: *Deleted

## 2013-02-17 MED ORDER — METOPROLOL TARTRATE 25 MG PO TABS: ORAL_TABLET | ORAL | Status: DC

## 2013-02-17 NOTE — Telephone Encounter (Signed)
Rx sent 

## 2013-02-19 HISTORY — PX: OTHER SURGICAL HISTORY: SHX169

## 2013-02-28 ENCOUNTER — Telehealth: Payer: Self-pay | Admitting: Internal Medicine

## 2013-02-28 NOTE — Telephone Encounter (Signed)
Med OK if not on antibiotics from surgeon; Lovie Chol

## 2013-02-28 NOTE — Telephone Encounter (Signed)
Patient informed and verbalized understanding

## 2013-02-28 NOTE — Telephone Encounter (Signed)
Pt is calling to report chest congestion, productive cough, green mucous that began on 02/24/13.  Pt is afebrile.  Pt has tried OTC Mucinex, Guaifenesin but it is not helping. RN triaged patient using the Cough Protocol.  Home Care was the disposition.  Pt wants to know if she can take Doxycycline for her symptoms.  Pt states she was prescribed the antibiotic in October 2013 by Dr Alwyn Ren for a sinus infection but did not take it.  Pt has 14 pills and was to take 1 tablet BID.  OFFICE PLEASE FOLLOW UP WITH PT IF MEDICATION IS OK FOR HER TO TAKE FOR SYMPTOMS.  Pt states she could not come into office today because she had back surgery 1 week ago and is still taking pain medication.  She is at home and husband is at work.

## 2013-02-28 NOTE — Telephone Encounter (Signed)
Hopp please advise  

## 2013-03-18 ENCOUNTER — Other Ambulatory Visit: Payer: Self-pay | Admitting: Internal Medicine

## 2013-04-21 ENCOUNTER — Other Ambulatory Visit: Payer: Self-pay | Admitting: Internal Medicine

## 2013-04-21 ENCOUNTER — Encounter: Payer: Self-pay | Admitting: Internal Medicine

## 2013-04-21 ENCOUNTER — Ambulatory Visit (INDEPENDENT_AMBULATORY_CARE_PROVIDER_SITE_OTHER): Payer: BC Managed Care – PPO | Admitting: Internal Medicine

## 2013-04-21 VITALS — BP 124/78 | HR 77 | Temp 98.0°F | Wt 128.8 lb

## 2013-04-21 DIAGNOSIS — N390 Urinary tract infection, site not specified: Secondary | ICD-10-CM

## 2013-04-21 LAB — POCT URINALYSIS DIPSTICK
Nitrite, UA: NEGATIVE
Protein, UA: NEGATIVE
Urobilinogen, UA: 0.2
pH, UA: 8.5

## 2013-04-21 MED ORDER — BUPROPION HCL ER (XL) 300 MG PO TB24: ORAL_TABLET | ORAL | Status: DC

## 2013-04-21 MED ORDER — NITROFURANTOIN MONOHYD MACRO 100 MG PO CAPS: 100.0000 mg | ORAL_CAPSULE | Freq: Two times a day (BID) | ORAL | Status: DC

## 2013-04-21 NOTE — Progress Notes (Signed)
  Subjective:    Patient ID: Sarah Escobar, female    DOB: 10-01-1956, 57 y.o.   MRN: [id number]  HPI   She noted cloudy urine as of 04/16/13. She's had left lower quadrant and lumbosacral area pain; but this is in the context of discectomy at L1-2 on 02/19/13 by Dr. Lovell Sheehan    Review of Systems  She is not having fever, chills, or sweats. There is no significant dysuria, pyuria, or hematuria.  She does have constipation & bloating(S/P BSO for endometriosis) but this is related to taking Vicodin following her back surgery.  She has no associated diarrhea, melena, or rectal bleeding.     Objective:   Physical Exam General appearance is one of good health and nourishment w/o distress.  Eyes: No conjunctival inflammation or scleral icterus is present.  Oral exam: Lower partial/upper plate; lips and gums are healthy appearing.There is no oropharyngeal erythema or exudate noted.   Heart:  Normal rate and regular rhythm. S1 and S2 normal without gallop, murmur, click, rub or other extra sounds     Lungs:Chest clear to auscultation; no wheezes, rhonchi,rales ,or rubs present.No increased work of breathing.   Abdomen: bowel sounds normal, soft and non-tender without masses, organomegaly or hernias noted.  No guarding or rebound   Skin:Warm & dry.  Intact without suspicious lesions or rashes ; no jaundice or tenting  Lymphatic: No lymphadenopathy is noted about the head, neck, axilla.   Musculoskeletal: Scar well healed with no evidence of cellulitis. Negative straight leg raising to 45 bilaterally           Assessment & Plan:  #1 Abnormal urine noted as change in clarity; urinalysis shows moderate leukocytes  #2 history of allergy to Levaquin and sulfa  #3 recent discectomy L1-2; this may be the cause of the back and left lower quadrant abdominal pain rather than #1  #4 iatrogenic constipation; MiraLax  recommended as needed every third day

## 2013-04-21 NOTE — Patient Instructions (Addendum)
Drink as much nondairy fluids as possible. Avoid spicy foods or alcohol as  these may aggravate the bladder . Do not take decongestants.  Miralax every 3 rd day as needed for constipation.  If you activate the  My Chart system; lab & Xray results will be released directly  to you as soon as I review & address these through the computer. If you choose not to sign up for My Chart within 36 hours of labs being drawn; results will be reviewed & interpretation added before being copied & mailed, causing a delay in getting the results to you.If you do not receive that report within 7-10 days ,please call. Additionally you can use this system to gain direct  access to your records  if  out of town or @ an office of a  physician who is not in  the My Chart network.  This improves continuity of care & places you in control of your medical record.

## 2013-04-23 ENCOUNTER — Telehealth: Payer: Self-pay | Admitting: *Deleted

## 2013-04-23 DIAGNOSIS — N3010 Interstitial cystitis (chronic) without hematuria: Secondary | ICD-10-CM

## 2013-04-23 NOTE — Telephone Encounter (Signed)
Pt notified of the results and is ok for referral. Orders placed.

## 2013-04-23 NOTE — Telephone Encounter (Signed)
Left message to call office   Pt calling for results of urine culture  Notes Recorded by Pecola Lawless, MD on 04/23/2013 at 8:13 AM Criteria for a significant Urinary Tract Infection (UTI) are: over 100,000 colonies of a single, not multiple bacteria.As these are not present, symptoms may be due to non infectious causes such as bladder inflammation or spasm (Ex Interstitial Cystitis). A Urology referral is recommended for recurrent or persistent symptoms to rule out such processes. [employer]

## 2013-05-01 ENCOUNTER — Other Ambulatory Visit: Payer: Self-pay | Admitting: Internal Medicine

## 2013-05-02 NOTE — Telephone Encounter (Signed)
Pt requesting this be expedited as she is leaving town today.

## 2013-05-05 ENCOUNTER — Other Ambulatory Visit: Payer: Self-pay | Admitting: Internal Medicine

## 2013-05-06 ENCOUNTER — Telehealth: Payer: Self-pay | Admitting: Internal Medicine

## 2013-05-06 MED ORDER — ALBUTEROL SULFATE HFA 108 (90 BASE) MCG/ACT IN AERS: INHALATION_SPRAY | RESPIRATORY_TRACT | Status: DC

## 2013-05-06 NOTE — Telephone Encounter (Signed)
Patient is calling requesting an order for an albuterol rescue inhaler. States that her mail order pharmacy is going to take about 8 days to get it to her and she just wants to have one on hand. Patient uses [facility] on [address] (Ph#: [phone]).

## 2013-05-06 NOTE — Telephone Encounter (Signed)
Hopp please advise for this patient has several inhalers listed and neither is a albuterol inhaler.

## 2013-05-06 NOTE — Telephone Encounter (Signed)
Rx sent. Left message to call office to advise Pt that Rx sent and of Dr Alwyn Ren comments.

## 2013-05-06 NOTE — Telephone Encounter (Signed)
Albuterol 1-2 puffs every 4-6 hours as needed. #1. If this is required more than 2-3 times per week; she should be seen as asthma is uncontrolled and requires other intervention(s).

## 2013-05-07 NOTE — Telephone Encounter (Signed)
Pt notified that med was filled and of Hopp's comments.

## 2013-06-16 ENCOUNTER — Telehealth: Payer: Self-pay | Admitting: *Deleted

## 2013-06-16 ENCOUNTER — Telehealth: Payer: Self-pay | Admitting: Internal Medicine

## 2013-06-16 MED ORDER — ALBUTEROL SULFATE (2.5 MG/3ML) 0.083% IN NEBU: INHALATION_SOLUTION | RESPIRATORY_TRACT | Status: DC

## 2013-06-16 NOTE — Telephone Encounter (Signed)
Opened in error. BC °

## 2013-06-16 NOTE — Telephone Encounter (Signed)
rx sent

## 2013-06-16 NOTE — Telephone Encounter (Deleted)
Patient called to report his pt/inr results. His pt is 1.9 and inr is 22.8. Please advise.

## 2013-06-23 ENCOUNTER — Telehealth: Payer: Self-pay | Admitting: Internal Medicine

## 2013-06-23 DIAGNOSIS — E8881 Metabolic syndrome: Secondary | ICD-10-CM

## 2013-06-23 DIAGNOSIS — R7401 Elevation of levels of liver transaminase levels: Secondary | ICD-10-CM

## 2013-06-23 NOTE — Telephone Encounter (Signed)
Yes patient is due for labs, future orders placed. Please schedule fasting lab appointment

## 2013-06-23 NOTE — Telephone Encounter (Signed)
Patient states that she is due for labs so that she can continue taking her Simvastatin rx. Please advise if patient needs to be scheduled for labs.

## 2013-07-02 ENCOUNTER — Other Ambulatory Visit: Payer: BC Managed Care – PPO

## 2013-07-04 ENCOUNTER — Other Ambulatory Visit: Payer: BC Managed Care – PPO

## 2013-07-10 ENCOUNTER — Telehealth: Payer: Self-pay | Admitting: Internal Medicine

## 2013-07-10 NOTE — Telephone Encounter (Signed)
Hopp please advise, this is not an active medication on patient's med list.  Patient is requesting this based on someone that she knows that it worked for.

## 2013-07-10 NOTE — Telephone Encounter (Signed)
Patient is calling to see if we had any samples of the [facility] inhaler that she could try out. States that it worked well for her father-in-law and she would like to try it out. Please advise.

## 2013-07-10 NOTE — Telephone Encounter (Signed)
Sample; as per package insert

## 2013-07-11 ENCOUNTER — Telehealth: Payer: Self-pay | Admitting: Internal Medicine

## 2013-07-11 NOTE — Telephone Encounter (Signed)
Patient is coming in to pick up the [facility] inhaler sample and wants to know if she should stop taking her Advair and Spiriva when she starts the [facility]. Please advise.

## 2013-07-11 NOTE — Telephone Encounter (Signed)
Sample placed at front desk for pick-up, patient aware. Verified with Dr.Hopper Elwin Sleight dose 200/5

## 2013-07-11 NOTE — Telephone Encounter (Signed)
Hopp please advise  

## 2013-07-11 NOTE — Telephone Encounter (Signed)
Stop Advair while on the [facility] as this would be duplicative therapy

## 2013-07-15 ENCOUNTER — Telehealth: Payer: Self-pay | Admitting: *Deleted

## 2013-07-15 NOTE — Telephone Encounter (Signed)
Called patient and ask if she was taking both advair and Dulara.  Patient stated that she was only taking the [facility] as directed by Dr. Alwyn Ren she called and spoke to someone 07/11/13.  Ag cma

## 2013-07-30 ENCOUNTER — Other Ambulatory Visit (INDEPENDENT_AMBULATORY_CARE_PROVIDER_SITE_OTHER): Payer: BC Managed Care – PPO

## 2013-07-30 DIAGNOSIS — E8881 Metabolic syndrome: Secondary | ICD-10-CM

## 2013-07-30 DIAGNOSIS — R7401 Elevation of levels of liver transaminase levels: Secondary | ICD-10-CM

## 2013-07-31 ENCOUNTER — Telehealth: Payer: Self-pay | Admitting: Internal Medicine

## 2013-07-31 LAB — ALT: ALT: 32 U/L (ref 0–35)

## 2013-07-31 LAB — LIPID PANEL
HDL: 51.1 mg/dL (ref >39.00)
LDL Cholesterol: 73 mg/dL (ref 0–99)
Total CHOL/HDL Ratio: 3

## 2013-07-31 NOTE — Telephone Encounter (Signed)
Patient states that the sample [facility] inhaler she was given came with a free trial voucher that she would like to use. She wants to know if we can send a rx for this to [facility] on [address]. She says the voucher allows up to 100 inhalation units. Please advise.   She also needs a refill on her albuterol solution. States that her pharmacy faxed request.

## 2013-07-31 NOTE — Telephone Encounter (Signed)
LM @ (2:23pm) asking the pt to RTC regarding refill request.  Needing to know how often she's using the inhaler and which albuterol she needs.//AB/CMA

## 2013-08-01 ENCOUNTER — Encounter: Payer: Self-pay | Admitting: *Deleted

## 2013-08-01 MED ORDER — MOMETASONE FURO-FORMOTEROL FUM 200-5 MCG/ACT IN AERO: 2.0000 | INHALATION_SPRAY | Freq: Two times a day (BID) | RESPIRATORY_TRACT | Status: DC

## 2013-08-01 MED ORDER — ALBUTEROL SULFATE (2.5 MG/3ML) 0.083% IN NEBU: INHALATION_SOLUTION | RESPIRATORY_TRACT | Status: DC

## 2013-08-01 NOTE — Telephone Encounter (Signed)
Rx sent to the pharmacy by e-script.//AB/CMA 

## 2013-08-25 ENCOUNTER — Other Ambulatory Visit: Payer: Self-pay | Admitting: Internal Medicine

## 2013-08-27 ENCOUNTER — Other Ambulatory Visit: Payer: Self-pay | Admitting: *Deleted

## 2013-08-27 DIAGNOSIS — I10 Essential (primary) hypertension: Secondary | ICD-10-CM

## 2013-08-27 MED ORDER — METOPROLOL TARTRATE 25 MG PO TABS: ORAL_TABLET | ORAL | Status: DC

## 2013-08-27 NOTE — Telephone Encounter (Signed)
Refill for Lopressor sent to [facility] mail order pharmacy

## 2013-09-10 ENCOUNTER — Other Ambulatory Visit: Payer: Self-pay | Admitting: Internal Medicine

## 2013-09-11 ENCOUNTER — Other Ambulatory Visit: Payer: Self-pay | Admitting: *Deleted

## 2013-09-11 MED ORDER — ALBUTEROL SULFATE (2.5 MG/3ML) 0.083% IN NEBU: INHALATION_SOLUTION | RESPIRATORY_TRACT | Status: DC

## 2013-09-11 NOTE — Telephone Encounter (Signed)
Albuterol for nebulizer refill sent

## 2013-09-17 ENCOUNTER — Telehealth: Payer: Self-pay | Admitting: Internal Medicine

## 2013-09-17 MED ORDER — MOMETASONE FURO-FORMOTEROL FUM 200-5 MCG/ACT IN AERO: 2.0000 | INHALATION_SPRAY | Freq: Two times a day (BID) | RESPIRATORY_TRACT | Status: DC

## 2013-09-17 NOTE — Telephone Encounter (Signed)
Patient is calling to request that a rx for [facility] be sent to her mail order pharmacy on file. She has been given samples and would now like a prescription. Please advise.

## 2013-09-17 NOTE — Telephone Encounter (Signed)
Dulera refill sent to pharmacy

## 2013-10-14 ENCOUNTER — Other Ambulatory Visit: Payer: Self-pay | Admitting: *Deleted

## 2013-10-14 ENCOUNTER — Telehealth: Payer: Self-pay | Admitting: Internal Medicine

## 2013-10-14 MED ORDER — MOMETASONE FURO-FORMOTEROL FUM 200-5 MCG/ACT IN AERO: 2.0000 | INHALATION_SPRAY | Freq: Two times a day (BID) | RESPIRATORY_TRACT | Status: DC

## 2013-10-14 NOTE — Telephone Encounter (Signed)
Done

## 2013-10-14 NOTE — Telephone Encounter (Signed)
Patient needs new 90-day supply Rx for [facility] sent to her mail order pharmacy.

## 2013-10-16 ENCOUNTER — Other Ambulatory Visit: Payer: Self-pay | Admitting: Obstetrics and Gynecology

## 2013-10-16 DIAGNOSIS — R928 Other abnormal and inconclusive findings on diagnostic imaging of breast: Secondary | ICD-10-CM

## 2013-10-21 ENCOUNTER — Ambulatory Visit
Admission: RE | Admit: 2013-10-21 | Discharge: 2013-10-21 | Disposition: A | Discharge status: Home / Self Care | Payer: BC Managed Care – PPO | Source: Ambulatory Visit | Attending: Obstetrics and Gynecology | Admitting: Obstetrics and Gynecology

## 2013-10-21 DIAGNOSIS — R928 Other abnormal and inconclusive findings on diagnostic imaging of breast: Secondary | ICD-10-CM

## 2013-10-27 ENCOUNTER — Other Ambulatory Visit: Payer: Self-pay | Admitting: Internal Medicine

## 2013-10-27 NOTE — Telephone Encounter (Signed)
Wellbutrin refilled per protocol

## 2013-10-28 ENCOUNTER — Telehealth: Payer: Self-pay

## 2013-10-28 MED ORDER — BUPROPION HCL ER (XL) 300 MG PO TB24: ORAL_TABLET | ORAL | Status: DC

## 2013-10-28 NOTE — Telephone Encounter (Addendum)
Left message for call back  identifiable Medication and allergies: reviewed and updated  90 day supply/mail order: [facility] Local pharmacy: [facility] [address]   Immunizations due:  Tdap?  A/P:   No changes to FH or PSH Pap--07/2011--nml MMG--10/2013--no abnormal findings--repeat 1 year CCS--11/2012--Dr Gessner--adenomatous polyps--repeat 2016 Flu vaccine at work  To Discuss with Provider: Should she be taking the spiriva and the dulera at the same time? Wellbutrin Rx sent to mail order

## 2013-10-28 NOTE — Addendum Note (Signed)
Addended by: Wende Mott on: 10/28/2013 12:41 PM   Modules accepted: Orders

## 2013-10-29 ENCOUNTER — Encounter: Payer: Self-pay | Admitting: Internal Medicine

## 2013-10-29 ENCOUNTER — Ambulatory Visit (INDEPENDENT_AMBULATORY_CARE_PROVIDER_SITE_OTHER): Payer: BC Managed Care – PPO | Admitting: Internal Medicine

## 2013-10-29 VITALS — BP 116/75 | HR 77 | Temp 97.6°F | Ht 66.25 in | Wt 131.2 lb

## 2013-10-29 DIAGNOSIS — Z23 Encounter for immunization: Secondary | ICD-10-CM

## 2013-10-29 DIAGNOSIS — E782 Mixed hyperlipidemia: Secondary | ICD-10-CM

## 2013-10-29 DIAGNOSIS — Z0000 Encounter for general adult medical examination without abnormal findings: Secondary | ICD-10-CM

## 2013-10-29 DIAGNOSIS — J309 Allergic rhinitis, unspecified: Secondary | ICD-10-CM

## 2013-10-29 DIAGNOSIS — J302 Other seasonal allergic rhinitis: Secondary | ICD-10-CM

## 2013-10-29 DIAGNOSIS — M899 Disorder of bone, unspecified: Secondary | ICD-10-CM

## 2013-10-29 LAB — CBC WITH DIFFERENTIAL/PLATELET
Basophils Absolute: 0 10*3/uL (ref 0.0–0.1)
Eosinophils Relative: 1.8 % (ref 0.0–5.0)
HCT: 42.4 % (ref 36.0–46.0)
Hemoglobin: 14.5 g/dL (ref 12.0–15.0)
Lymphocytes Relative: 28.7 % (ref 12.0–46.0)
Lymphs Abs: 2.4 10*3/uL (ref 0.7–4.0)
Monocytes Relative: 5.3 % (ref 3.0–12.0)
Neutro Abs: 5.4 10*3/uL (ref 1.4–7.7)
Neutrophils Relative %: 64 % (ref 43.0–77.0)
RDW: 12.3 % (ref 11.5–14.6)
WBC: 8.4 10*3/uL (ref 4.5–10.5)

## 2013-10-29 LAB — TSH: TSH: 1.47 u[IU]/mL (ref 0.35–5.50)

## 2013-10-29 LAB — BASIC METABOLIC PANEL
BUN: 9 mg/dL (ref 6–23)
Calcium: 9.5 mg/dL (ref 8.4–10.5)
Creatinine, Ser: 0.8 mg/dL (ref 0.4–1.2)
GFR: 78.39 mL/min (ref >60.00)
Glucose, Bld: 83 mg/dL (ref 70–99)
Potassium: 4 mEq/L (ref 3.5–5.1)
Sodium: 135 mEq/L (ref 135–145)

## 2013-10-29 NOTE — Progress Notes (Signed)
Pre visit review using our clinic review tool, if applicable. No additional management support is needed unless otherwise documented below in the visit note. 

## 2013-10-29 NOTE — Patient Instructions (Signed)
Plain Mucinex (NOT D) for thick secretions ;force NON dairy fluids .   Nasal cleansing in the shower as discussed with lather of mild shampoo.After 10 seconds wash off lather while  exhaling through nostrils. Make sure that all residual soap is removed to prevent irritation.  Fluticasone 1 spray in each nostril twice a day as needed. Use the "crossover" technique into opposite nostril spraying toward opposite ear @ 45 degree angle, not straight up into nostril.  Use a Neti pot daily only  as needed for significant sinus congestion; going from open side to congested side . Plain Allegra (NOT D )  160 daily , Loratidine 10 mg , OR Zyrtec 10 mg @ bedtime  as needed for itchy eyes & sneezing.   Your next office appointment will be determined based upon review of your pending labs . Those instructions will be transmitted to you  by mail if you're not using this system.

## 2013-10-29 NOTE — Progress Notes (Signed)
   Subjective:    Patient ID: Sarah Escobar, female    DOB: 09/04/1956, 57 y.o.   MRN: [id number]  HPI Sarah Escobar is here for a physical;acute issues include intermittent head congestion & asthma exacerbations, especially with dust & fumes.     Review of Systems A heart healthy diet is followed; exercise encompasses 30 minutes 2  times per week as  walking without symptoms.  Family history is strongly positive for premature coronary disease. Advanced cholesterol testing reveals  LDL goal is less than 100 ; ideally < 70 . There is medication compliance with the statin.  Low dose ASA not taken Specifically denied are  chest pain, palpitations, dyspnea, or claudication.  Significant abdominal symptoms, memory deficit, or myalgias not present.     Objective:   Physical Exam Gen.:  well-nourished in appearance. Alert, appropriate and cooperative throughout exam.Appears younger than stated age  Head: Normocephalic without obvious abnormalities  Eyes: No corneal or conjunctival inflammation noted. Pupils equal round reactive to light and accommodation. Extraocular motion intact.  Ears: External  ear exam reveals no significant lesions or deformities. Canals clear .TMs normal. Hearing is grossly slightly decreased on R. Nose: External nasal exam reveals no deformity or inflammation. Nasal mucosa are pink and moist. No lesions or exudates noted.   Mouth: Oral mucosa and oropharynx reveal no lesions or exudates. Upper plate & lower partial. Neck: No deformities, masses, or tenderness noted. Range of motion slightly decreased. Thyroid normal. Lungs: Normal respiratory effort; chest expands symmetrically. Lungs are clear to auscultation without rales, wheezes, or increased work of breathing. Heart: Normal rate and rhythm. Normal S1 and S2. No gallop, click, or rub. S4 w/o murmur. Abdomen: Bowel sounds normal; abdomen soft and nontender. No masses, organomegaly or hernias noted. Genitalia:  as per Gyn                                   Musculoskeletal/extremities: Minimal asymmetry of the posterior thoracic musculature suggesting occult scoliosis. No clubbing, cyanosis, edema, or significant extremity  deformity noted. Range of motion normal .Tone & strength normal. Hand joints normal . Fingernail health good. Able to lie down & sit up w/o help. Negative SLR bilaterally Vascular: Carotid, radial artery, dorsalis pedis and  posterior tibial pulses are  Equal. Decreased DPP. No bruits present. Neurologic: Alert and oriented x3. Deep tendon reflexes symmetrical and normal.         Skin: Intact without suspicious lesions or rashes. Lymph: No cervical, axillary lymphadenopathy present. Psych: Mood and affect are normal. Normally interactive                                                                                        Assessment & Plan:  #1 comprehensive physical exam; no acute findings  Plan: see Orders  & Recommendations

## 2013-10-31 ENCOUNTER — Encounter: Payer: Self-pay | Admitting: *Deleted

## 2013-11-01 LAB — VITAMIN D 1,25 DIHYDROXY
Vitamin D2 1, 25 (OH)2: <8 pg/mL
Vitamin D3 1, 25 (OH)2: 32 pg/mL

## 2013-11-03 ENCOUNTER — Encounter: Payer: Self-pay | Admitting: *Deleted

## 2013-11-13 ENCOUNTER — Other Ambulatory Visit: Payer: Self-pay | Admitting: Internal Medicine

## 2013-11-14 NOTE — Telephone Encounter (Signed)
Patient is calling about this refill request. Please advise.

## 2013-11-14 NOTE — Telephone Encounter (Signed)
OK X1 

## 2013-11-24 ENCOUNTER — Telehealth: Payer: Self-pay | Admitting: *Deleted

## 2013-11-24 DIAGNOSIS — I10 Essential (primary) hypertension: Secondary | ICD-10-CM

## 2013-11-24 MED ORDER — METOPROLOL TARTRATE 25 MG PO TABS: ORAL_TABLET | ORAL | Status: DC

## 2013-11-24 NOTE — Telephone Encounter (Signed)
Refill for Metoprolol sent to [facility] mail order [facility]

## 2013-12-18 ENCOUNTER — Other Ambulatory Visit: Payer: Self-pay | Admitting: Internal Medicine

## 2013-12-19 NOTE — Telephone Encounter (Signed)
Simvastatin refilled per protocol. JG//CMA

## 2014-01-06 ENCOUNTER — Other Ambulatory Visit: Payer: Self-pay | Admitting: Internal Medicine

## 2014-01-07 ENCOUNTER — Telehealth: Payer: Self-pay

## 2014-01-07 NOTE — Telephone Encounter (Signed)
The patient called and is hoping to get an rx for Ensure plus , she is hoping for unlimited quantity - for pick up at [place].   Callback - [phone] ext [phone]

## 2014-01-07 NOTE — Telephone Encounter (Signed)
   To my knowledge this is available without prescription; unless there is a specific diagnosis/diagnostic head covered in your case. You can check with insurance company as to such coverage.

## 2014-01-08 ENCOUNTER — Telehealth: Payer: Self-pay | Admitting: Internal Medicine

## 2014-01-08 ENCOUNTER — Other Ambulatory Visit: Payer: Self-pay | Admitting: General Practice

## 2014-01-08 MED ORDER — ENSURE COMPLETE SHAKE PO LIQD: ORAL | Status: DC

## 2014-01-08 MED ORDER — ALBUTEROL SULFATE (2.5 MG/3ML) 0.083% IN NEBU: INHALATION_SOLUTION | RESPIRATORY_TRACT | Status: DC

## 2014-01-08 NOTE — Telephone Encounter (Signed)
Pt.notified

## 2014-01-08 NOTE — Telephone Encounter (Signed)
Okay for one case. R X 2. I would recommend that she take this no more than every other day. Kidney function should be checked annually.

## 2014-01-08 NOTE — Telephone Encounter (Signed)
Would you like for me to fill this?

## 2014-01-08 NOTE — Telephone Encounter (Signed)
Rx written and faxed. Will notify pt.

## 2014-01-08 NOTE — Telephone Encounter (Signed)
Patient is calling again about receiving an rx for Ensure plus, she is hoping for unlimited quantity. She is wanting to let Dr. Linna Darner know that this Rx is not going to be billed to her insurance. She works for [employer] and will be paying out-of-pocket for it at a discounted cost that she gets through her employer. Patient says that she just needs a prescription in order to be eligible to receive it at the discounted cost. Please send to Fax#: [phone]

## 2014-01-08 NOTE — Telephone Encounter (Signed)
Med filled.  

## 2014-01-09 NOTE — Telephone Encounter (Signed)
Pt notified of rx sent last night.

## 2014-01-15 ENCOUNTER — Telehealth: Payer: Self-pay | Admitting: *Deleted

## 2014-01-15 NOTE — Telephone Encounter (Signed)
Patient called and requested a refill for fluticasone (FLONASE) 50 MCG/ACT nasal spray   Pharmacy [facility] [address]

## 2014-01-16 MED ORDER — FLUTICASONE PROPIONATE 50 MCG/ACT NA SUSP: NASAL | Status: DC

## 2014-01-16 NOTE — Telephone Encounter (Addendum)
Rx sent to the pharmacy by e-script.  Pt aware.//AB/CMA 

## 2014-01-16 NOTE — Telephone Encounter (Signed)
Patient is calling to see about her rx request. Please advise.

## 2014-02-05 ENCOUNTER — Ambulatory Visit (INDEPENDENT_AMBULATORY_CARE_PROVIDER_SITE_OTHER): Payer: BC Managed Care – PPO | Admitting: Internal Medicine

## 2014-02-05 ENCOUNTER — Encounter: Payer: Self-pay | Admitting: Internal Medicine

## 2014-02-05 ENCOUNTER — Ambulatory Visit (INDEPENDENT_AMBULATORY_CARE_PROVIDER_SITE_OTHER)
Admission: RE | Admit: 2014-02-05 | Discharge: 2014-02-05 | Disposition: A | Discharge status: Home / Self Care | Payer: BC Managed Care – PPO | Source: Ambulatory Visit | Attending: Internal Medicine | Admitting: Internal Medicine

## 2014-02-05 VITALS — BP 110/68 | HR 92 | Temp 98.3°F | Wt 131.8 lb

## 2014-02-05 DIAGNOSIS — J209 Acute bronchitis, unspecified: Secondary | ICD-10-CM | POA: Insufficient documentation

## 2014-02-05 DIAGNOSIS — J440 Chronic obstructive pulmonary disease with acute lower respiratory infection: Secondary | ICD-10-CM

## 2014-02-05 MED ORDER — DOXYCYCLINE HYCLATE 100 MG PO TABS: 100.0000 mg | ORAL_TABLET | Freq: Two times a day (BID) | ORAL | Status: DC

## 2014-02-05 MED ORDER — FLUTICASONE FUROATE-VILANTEROL 100-25 MCG/INH IN AEPB: 1.0000 | INHALATION_SPRAY | RESPIRATORY_TRACT | Status: DC

## 2014-02-05 NOTE — Progress Notes (Signed)
   Subjective:    Patient ID: Sarah Escobar, female    DOB: 11-17-1956, 58 y.o.   MRN: [id number]  HPI  Symptoms began 01/27/14 with sore throat, head congestion, chest congestion and discolored sputum. This was associated with sneezing, chills and sweats. She may have been exposed to sick work associates  She was prescribed Augmentin Augmentin for 10 days as well as prednisone. She also use Mucinex  At this time she has some mild frontal headache and residual sore throat. She has some nasal purulence but the major purulence comes from her chest. This is associated with wheezing and shortness of breath.  She also is continuing to have some low-grade fever, chills, sweats  She has been using nasal cleansing daily for almost a year. By history she has underdeveloped frontal sinuses.    Review of Systems     Objective:   Physical Exam General appearance:adequately nourished; no acute distress or increased work of breathing is present.  No  lymphadenopathy about the head, neck, or axilla noted.   Eyes: No conjunctival inflammation or lid edema is present.  Ears:  External ear exam shows no significant lesions or deformities.  Otoscopic examination reveals clear canals, tympanic membranes are intact bilaterally without bulging, retraction, inflammation or discharge.  Nose:  External nasal examination shows no deformity or inflammation. Nasal mucosa are pink and moist without lesions or exudates. No septal dislocation or deviation.No obstruction to airflow.   Oral exam: Upper plate & lower partial; lips and gums are healthy appearing.There is no oropharyngeal erythema or exudate noted.   Neck:  No deformities, , masses, or tenderness noted.   Supple with full range of motion without pain.   Heart:  Normal rate and regular rhythm. S1 and S2 normal without gallop, murmur, click, rub or other extra sounds.   Lungs:Low grade juicy rhonchi & rales all fields.No increased work of breathing.     Extremities:  No cyanosis, edema, or clubbing  noted    Skin: Warm & dry w/o jaundice or tenting.         Assessment & Plan:  #1 COAD exacerbation See Orders

## 2014-02-05 NOTE — Patient Instructions (Signed)
Your next office appointment will be determined based upon review of your pending  x-rays. Those instructions will be transmitted to you by phone or  by mail Followup as needed for your acute issue. Please report any significant change in your symptoms.  Breo one  inhalations every 12 hours; gargle and spit after use

## 2014-02-05 NOTE — Progress Notes (Signed)
Pre visit review using our clinic review tool, if applicable. No additional management support is needed unless otherwise documented below in the visit note. 

## 2014-02-06 ENCOUNTER — Other Ambulatory Visit: Payer: Self-pay | Admitting: Internal Medicine

## 2014-02-06 ENCOUNTER — Telehealth: Payer: Self-pay | Admitting: *Deleted

## 2014-02-06 DIAGNOSIS — R9389 Abnormal findings on diagnostic imaging of other specified body structures: Secondary | ICD-10-CM

## 2014-02-06 NOTE — Telephone Encounter (Signed)
Message copied by Harl Bowie on Fri Feb 06, 2014  5:36 PM ------      Message from: Hendricks Limes      Created: Fri Feb 06, 2014  8:58 AM       Please see x-ray. This suggests a possible but not definite early right lower lung pneumonia. Complete the full course of doxycycline. Repeat chest x-ray in 7-10 days; sooner if symptoms progress. ------

## 2014-02-06 NOTE — Telephone Encounter (Signed)
LMOM x 2 asking the pt RTC regarding x-ray results below.//AB/CMA

## 2014-02-09 NOTE — Telephone Encounter (Signed)
Pt called back. Gave her results and directions from [place] below.

## 2014-02-10 NOTE — Telephone Encounter (Signed)
May have sample of Breo. Takeprescribed narcotic pain med for back pain ;also can  Aleve one to 2 every 8-12 hours with food as needed.  If the  symptoms persist or progress; Pulmonary referral recommended

## 2014-02-10 NOTE — Telephone Encounter (Signed)
Spoke with the pt and she stated she was given her results.  Pt understood.  Pt stated she was given Breo inhaler and she wanted to know if she needs to stay on it.  If so she will need another sample or rx called in.  Also the pt said she is having a lot of pain in her mid to low back on the (R) side times 1 week.  She feels it maybe coming from her coughing.  She stated that she had back surgery on (02-19-13), and she don't know if she may have done something to her back from all the coughing.  Please advise.//AB/CMA

## 2014-02-12 ENCOUNTER — Ambulatory Visit (INDEPENDENT_AMBULATORY_CARE_PROVIDER_SITE_OTHER)
Admission: RE | Admit: 2014-02-12 | Discharge: 2014-02-12 | Disposition: A | Discharge status: Home / Self Care | Payer: BC Managed Care – PPO | Source: Ambulatory Visit | Attending: Internal Medicine | Admitting: Internal Medicine

## 2014-02-12 ENCOUNTER — Other Ambulatory Visit: Payer: Self-pay | Admitting: Internal Medicine

## 2014-02-12 DIAGNOSIS — R9389 Abnormal findings on diagnostic imaging of other specified body structures: Secondary | ICD-10-CM

## 2014-02-12 DIAGNOSIS — R918 Other nonspecific abnormal finding of lung field: Secondary | ICD-10-CM

## 2014-02-13 ENCOUNTER — Telehealth: Payer: Self-pay | Admitting: *Deleted

## 2014-02-13 ENCOUNTER — Ambulatory Visit: Payer: BC Managed Care – PPO | Admitting: Internal Medicine

## 2014-02-13 ENCOUNTER — Encounter: Payer: Self-pay | Admitting: Internal Medicine

## 2014-02-13 NOTE — Telephone Encounter (Signed)
Patient phoned requesting results from cxray yesterday.  Per review, a letter has been sent with result info as well as referral info. Phoned and notified patient via voicemail message.

## 2014-02-16 ENCOUNTER — Encounter: Payer: Self-pay | Admitting: *Deleted

## 2014-02-19 ENCOUNTER — Telehealth: Payer: Self-pay | Admitting: Internal Medicine

## 2014-02-19 ENCOUNTER — Other Ambulatory Visit: Payer: Self-pay | Admitting: *Deleted

## 2014-02-19 DIAGNOSIS — J440 Chronic obstructive pulmonary disease with acute lower respiratory infection: Secondary | ICD-10-CM

## 2014-02-19 DIAGNOSIS — J209 Acute bronchitis, unspecified: Secondary | ICD-10-CM

## 2014-02-19 MED ORDER — FLUTICASONE FUROATE-VILANTEROL 100-25 MCG/INH IN AEPB: 1.0000 | INHALATION_SPRAY | RESPIRATORY_TRACT | Status: DC

## 2014-02-19 MED ORDER — ALBUTEROL SULFATE (2.5 MG/3ML) 0.083% IN NEBU: INHALATION_SOLUTION | RESPIRATORY_TRACT | Status: DC

## 2014-02-19 NOTE — Telephone Encounter (Signed)
Pt request for Dr. Linna Darner to send in new RX for Ipratropium-albuterol, pt stated that urgent care doctor gave his to her. Pt also stated that she never got her xray result back. Please call back

## 2014-02-20 ENCOUNTER — Telehealth: Payer: Self-pay | Admitting: Internal Medicine

## 2014-02-20 MED ORDER — IPRATROPIUM-ALBUTEROL 0.5-2.5 (3) MG/3ML IN SOLN: 3.0000 mL | Freq: Four times a day (QID) | RESPIRATORY_TRACT | Status: DC | PRN

## 2014-02-20 NOTE — Telephone Encounter (Signed)
Rx sent to the pharmacy by e-script.  Pt aware.//AB/CMA 

## 2014-02-20 NOTE — Telephone Encounter (Signed)
   Combination therapy is fine of albuterol and ipratropium ; one ampule every 6 hours as needed dispense 90

## 2014-02-20 NOTE — Telephone Encounter (Signed)
Ok  See Xray results Pulmonary referral placed foir persistent symptoms

## 2014-02-20 NOTE — Telephone Encounter (Signed)
Spoke with the pt and informed her Dr. Clayborn Heron note below.  Pt understood and agreed.  Rx sent to the pharmacy by e-script.  Pt aware.//AB/CMA

## 2014-02-20 NOTE — Telephone Encounter (Signed)
Patient states that she wanted the combination albuterol solution medication. States that it is called Ipratropium-albuterol. Please advise.

## 2014-02-23 ENCOUNTER — Telehealth: Payer: Self-pay | Admitting: Internal Medicine

## 2014-02-23 DIAGNOSIS — J209 Acute bronchitis, unspecified: Secondary | ICD-10-CM

## 2014-02-23 DIAGNOSIS — J440 Chronic obstructive pulmonary disease with acute lower respiratory infection: Secondary | ICD-10-CM

## 2014-02-23 NOTE — Telephone Encounter (Signed)
Patient is calling to request that her Fluticasone Furoate-Vilanterol (BREO ELLIPTA) 100-25 MCG/INH AEPB rx be re-sent to [facility]. They did not receive.

## 2014-02-24 MED ORDER — FLUTICASONE FUROATE-VILANTEROL 100-25 MCG/INH IN AEPB: 1.0000 | INHALATION_SPRAY | RESPIRATORY_TRACT | Status: DC

## 2014-02-24 NOTE — Telephone Encounter (Signed)
RX resent to [facility]

## 2014-02-25 ENCOUNTER — Ambulatory Visit (INDEPENDENT_AMBULATORY_CARE_PROVIDER_SITE_OTHER): Payer: BC Managed Care – PPO | Admitting: Pulmonary Disease

## 2014-02-25 ENCOUNTER — Encounter: Payer: Self-pay | Admitting: Pulmonary Disease

## 2014-02-25 VITALS — BP 122/70 | HR 80 | Temp 97.2°F | Ht 66.0 in | Wt 130.2 lb

## 2014-02-25 DIAGNOSIS — J8410 Pulmonary fibrosis, unspecified: Secondary | ICD-10-CM

## 2014-02-25 DIAGNOSIS — J209 Acute bronchitis, unspecified: Secondary | ICD-10-CM

## 2014-02-25 DIAGNOSIS — J849 Interstitial pulmonary disease, unspecified: Secondary | ICD-10-CM | POA: Insufficient documentation

## 2014-02-25 DIAGNOSIS — J440 Chronic obstructive pulmonary disease with acute lower respiratory infection: Secondary | ICD-10-CM

## 2014-02-25 MED ORDER — IPRATROPIUM-ALBUTEROL 0.5-2.5 (3) MG/3ML IN SOLN: 3.0000 mL | Freq: Four times a day (QID) | RESPIRATORY_TRACT | Status: DC | PRN

## 2014-02-25 MED ORDER — FLUTICASONE FUROATE-VILANTEROL 100-25 MCG/INH IN AEPB: 1.0000 | INHALATION_SPRAY | RESPIRATORY_TRACT | Status: DC

## 2014-02-25 NOTE — Assessment & Plan Note (Addendum)
Chest x-ray findings may simply represent resolving atypical pneumonia or may be representative of interstitial lung disease underlying. Noted chest x-ray from 2013 also shows subtle interstitial prominence. High resolution CT chest to define further

## 2014-02-25 NOTE — Assessment & Plan Note (Addendum)
Refill on breo & duonebs STOP taking spiriva (duonebs has this in it) PFTs in future - once acute issues resolved, not sure whether she has COPD or asthma or a combination of both. Reversibility with albuterol may help to define this No more steroids needed - no bspasm

## 2014-02-25 NOTE — Patient Instructions (Signed)
You may have resolving pneumonia or scarring in your lungs CT chest Refill on breo & duonebs STOP taking spiriva (duonebs has this in it)

## 2014-02-25 NOTE — Progress Notes (Signed)
Subjective:    Patient ID: Sarah Escobar, female    DOB: 09-20-1956, 58 y.o.   MRN: [id number]  HPI  PCP - Linna Darner  58 year old ex-smoker referred for evaluation of abnormal chest x-ray. She works in Geologist, engineering at [employer]. She smoked a pack per day for about 30-pack-years before quitting in 2003. She was given a diagnosis of asthma for more than 5 years and maintained on dulera and Spiriva. She developed sinusitis and bronchitis symptoms in January, requiring to urgent care visits and required 2 rounds of antibiotics and steroids. She is now 75% better but still complaints of mild dyspnea and left-sided pleuritic chest pain. Chest x-ray on 3/5 and 02/12/14 showed bilateral interstitial infiltrates-right more than left, which were persistent. She reports a nonproductive cough and denies wheezing. Is no history of fevers or weight loss. There is no history of environmental exposure. She did not desaturate on exertion today. No prior pulmonary function testing is available  Past Medical History  Diagnosis Date  . Hyperlipidemia     LDL goal = < 100  . Hypertension   . Asthma     adult onset  . Depression   . Sciatica of left side     intermittent  . Allergy   . Personal history of adenomatous colonic polyps/FHx colon cancer sister and father     [name]   Past Surgical History  Procedure Laterality Date  . Total abdominal hysterectomy w/ bilateral salpingoophorectomy  2003    endometriosis   . Nasal sinus surgery  [phone]    X 3  . Cervical fusion  1999    Dr Arnoldo Morale, [place]  . Colonoscopy w/ polypectomy  multiple    adenomas; [name]; last 2013  . Discetomy lumbar  02/19/13    L1&2; Dr Arnoldo Morale, [place]    Allergies  Allergen Reactions  . Flagyl [Metronidazole] Other (See Comments)    Nerve pain  . Levofloxacin     rash  . Loperamide Hcl     rash  . Neomycin-Bacitracin Zn-Polymyx     rash  . Ofloxacin     rash  . Oxycodone Hcl     rash  . Sulfonamide  Derivatives     hives  . Band-Aid Liquid Bandage [New Skin]     Regular band-aid cause itching and redness    History   Social History  . Marital Status: Married    Spouse Name: N/A    Number of Children: 1  . Years of Education: N/A   Occupational History  .  [employer]   Social History Main Topics  . Smoking status: Former Smoker -- 1.00 packs/day for 30 years    Types: Cigarettes    Quit date: 12/04/2001  . Smokeless tobacco: Never Used     Comment:    . Alcohol Use: Yes     Comment: occasionally  . Drug Use: No  . Sexual Activity: Not on file   Other Topics Concern  . Not on file   Social History Narrative  . No narrative on file    Family History  Problem Relation Age of Onset  . Colon cancer Father   . Heart attack Father 17  . Diabetes Father   . Stroke Father 75  . Colon cancer Sister   . Asthma Sister   . Asthma Mother   . Colitis Mother   . Cirrhosis Mother     non alcoholic, fatty liver  . Asthma Maternal Grandmother   .  Asthma Maternal Aunt   . Heart attack Sister 41  . Cirrhosis Brother     non alcoholic  . Kidney failure      2 bro, 1 sister ? from HTN  . Anemia Brother      Spherocytosis, hereditrary     Review of Systems  Constitutional: Positive for appetite change and unexpected weight change. Negative for fever.  HENT: Positive for congestion, ear pain and postnasal drip. Negative for dental problem, nosebleeds, rhinorrhea, sinus pressure, sneezing, sore throat and trouble swallowing.   Eyes: Negative for redness and itching.  Respiratory: Positive for cough, shortness of breath and wheezing. Negative for chest tightness.   Cardiovascular: Positive for chest pain. Negative for palpitations and leg swelling.  Gastrointestinal: Negative for nausea and vomiting.  Genitourinary: Negative for dysuria.  Musculoskeletal: Negative for joint swelling.  Skin: Negative for rash.  Neurological: Negative for headaches.   Hematological: Does not bruise/bleed easily.  Psychiatric/Behavioral: Negative for dysphoric mood. The patient is not nervous/anxious.        Objective:   Physical Exam  Gen. Pleasant, thin woman, in no distress, normal affect ENT - no lesions, no post nasal drip Neck: No JVD, no thyromegaly, no carotid bruits Lungs: no use of accessory muscles, no dullness to percussion, Right infrascapular coarse rales, left fine rales no rhonchi  Cardiovascular: Rhythm regular, heart sounds  normal, no murmurs or gallops, no peripheral edema Abdomen: soft and non-tender, no hepatosplenomegaly, BS normal. Musculoskeletal: No deformities, no cyanosis or clubbing Neuro:  alert, non focal       Assessment & Plan:

## 2014-03-02 ENCOUNTER — Ambulatory Visit (INDEPENDENT_AMBULATORY_CARE_PROVIDER_SITE_OTHER)
Admission: RE | Admit: 2014-03-02 | Discharge: 2014-03-02 | Disposition: A | Discharge status: Home / Self Care | Payer: BC Managed Care – PPO | Source: Ambulatory Visit | Attending: Pulmonary Disease | Admitting: Pulmonary Disease

## 2014-03-02 DIAGNOSIS — J8410 Pulmonary fibrosis, unspecified: Secondary | ICD-10-CM

## 2014-03-02 DIAGNOSIS — J849 Interstitial pulmonary disease, unspecified: Secondary | ICD-10-CM

## 2014-03-04 ENCOUNTER — Other Ambulatory Visit: Payer: Self-pay | Admitting: Pulmonary Disease

## 2014-03-04 DIAGNOSIS — J849 Interstitial pulmonary disease, unspecified: Secondary | ICD-10-CM

## 2014-03-05 ENCOUNTER — Telehealth: Payer: Self-pay | Admitting: Pulmonary Disease

## 2014-03-05 MED ORDER — PREDNISONE 10 MG PO TABS: ORAL_TABLET | ORAL | Status: DC

## 2014-03-05 NOTE — Telephone Encounter (Signed)
Pt aware of recs and rx called in. Nothing further needed

## 2014-03-05 NOTE — Telephone Encounter (Signed)
Prednisone 10 mg tabs  Take 2 tabs daily with food x 5ds, then 1 tab daily with food x 5ds then STOP  

## 2014-03-05 NOTE — Telephone Encounter (Signed)
Called spoke with pt. She c/o still having the left side chest pain, SOB no change, feeling tired. She has PFT and OV scheduled for 04/07/14. She is requesting RX for prednisone? Please advise RA thanks  Allergies  Allergen Reactions  . Flagyl [Metronidazole] Other (See Comments)    Nerve pain  . Levofloxacin     rash  . Loperamide Hcl     rash  . Neomycin-Bacitracin Zn-Polymyx     rash  . Ofloxacin     rash  . Oxycodone Hcl     rash  . Sulfonamide Derivatives     hives  . Band-Aid Liquid Bandage [New Skin]     Regular band-aid cause itching and redness

## 2014-04-02 ENCOUNTER — Ambulatory Visit (INDEPENDENT_AMBULATORY_CARE_PROVIDER_SITE_OTHER): Payer: BC Managed Care – PPO | Admitting: Family Medicine

## 2014-04-02 ENCOUNTER — Encounter: Payer: Self-pay | Admitting: Family Medicine

## 2014-04-02 VITALS — BP 118/80 | HR 97 | Temp 98.1°F | Wt 134.0 lb

## 2014-04-02 DIAGNOSIS — H109 Unspecified conjunctivitis: Secondary | ICD-10-CM

## 2014-04-02 MED ORDER — NONFORMULARY OR COMPOUNDED ITEM: Status: DC

## 2014-04-02 MED ORDER — PREDNISONE 10 MG PO TABS: ORAL_TABLET | ORAL | Status: DC

## 2014-04-02 NOTE — Progress Notes (Signed)
  Subjective:    Sarah Escobar is a 58 y.o. female who presents for evaluation of erythema, itching and tearing in both eyes. She has noticed the above symptoms for a few days. Onset was gradual. Patient denies blurred vision and visual field deficit. There is a history of allergies and contact lens use.  The following portions of the patient's history were reviewed and updated as appropriate: allergies, current medications, past family history, past medical history, past social history, past surgical history and problem list.  Review of Systems Pertinent items are noted in HPI.   Objective:    BP 118/80  Pulse 97  Temp(Src) 98.1 F (36.7 C) (Oral)  Wt 134 lb (60.782 kg)  SpO2 95%      General: alert, cooperative, appears stated age and no distress  Eyes:  positive findings: eyelids/periorbital: + rash eyelids b/l  and corneal abrasion b/l  Vision: Not performed  Fluorescein:  positive uptake b/L      Assessment:    Blepharitis and Corneal abrasion   Plan:    Discussed the diagnosis and proper care of conjunctivitis.  Stressed household Nurse, mental health. Ophthalmic drops per orders. Warm compress to eye(s). Local eye care discussed.  F/u prn

## 2014-04-02 NOTE — Progress Notes (Signed)
Pre visit review using our clinic review tool, if applicable. No additional management support is needed unless otherwise documented below in the visit note. 

## 2014-04-02 NOTE — Patient Instructions (Signed)

## 2014-04-06 ENCOUNTER — Ambulatory Visit: Payer: BC Managed Care – PPO | Admitting: Internal Medicine

## 2014-04-07 ENCOUNTER — Ambulatory Visit (INDEPENDENT_AMBULATORY_CARE_PROVIDER_SITE_OTHER): Payer: BC Managed Care – PPO | Admitting: Pulmonary Disease

## 2014-04-07 ENCOUNTER — Other Ambulatory Visit: Payer: Self-pay | Admitting: Pulmonary Disease

## 2014-04-07 ENCOUNTER — Encounter: Payer: Self-pay | Admitting: Pulmonary Disease

## 2014-04-07 ENCOUNTER — Other Ambulatory Visit: Payer: BC Managed Care – PPO

## 2014-04-07 VITALS — BP 140/86 | HR 101 | Ht 64.0 in | Wt 133.0 lb

## 2014-04-07 DIAGNOSIS — J849 Interstitial pulmonary disease, unspecified: Secondary | ICD-10-CM

## 2014-04-07 DIAGNOSIS — J479 Bronchiectasis, uncomplicated: Secondary | ICD-10-CM | POA: Insufficient documentation

## 2014-04-07 DIAGNOSIS — J45909 Unspecified asthma, uncomplicated: Secondary | ICD-10-CM

## 2014-04-07 DIAGNOSIS — J8410 Pulmonary fibrosis, unspecified: Secondary | ICD-10-CM

## 2014-04-07 LAB — RHEUMATOID FACTOR: Rhuematoid fact SerPl-aCnc: <10 IU/mL (ref <=14)

## 2014-04-07 NOTE — Progress Notes (Signed)
PFT done today. 

## 2014-04-07 NOTE — Assessment & Plan Note (Signed)
This could be related to prior pneumonias or to underlying ILD

## 2014-04-07 NOTE — Assessment & Plan Note (Signed)
Although no bronchodilator response on PFTs ,  This could possibly be asthmatic bronchitis or simply be related to bronchiectasis. Given absence of airway obstruction, this is definitely not COPD. Reasonable to continue Breo at this point but does not need LAMA.

## 2014-04-07 NOTE — Assessment & Plan Note (Signed)
HRCT findings could represent early ILD or simply denote a bronchiectatic flare with mild areas of pneumonitis DLCO is only mildly decreased at 76%. We'll obtain basic serology and hypersensitivity panel but doubt biopsy required at this point

## 2014-04-07 NOTE — Patient Instructions (Signed)
Lung function is mildly decreased You have bronchiectasis & early scarring in your lungs You have symptoms of asthmatic bronchitis - stay on breo & nebs Call if symptoms worse Blood work today

## 2014-04-07 NOTE — Progress Notes (Signed)
   Subjective:    Patient ID: Sarah Escobar, female    DOB: 02-24-1956, 58 y.o.   MRN: [id number]  HPI PCP - Linna Darner  58 year old ex-smoker with asthmatic bronchitis & ILD.  She works in Geologist, engineering at [employer].  She smoked a pack per day for about 30-pack-years before quitting in 2003. She was given a diagnosis of asthma for more than 5 years and maintained on dulera and Spiriva. She developed sinusitis and bronchitis symptoms in January, requiring to urgent care visits and required 2 rounds of antibiotics and steroids. She is now back to baseline. Chest x-ray on 3/5 and 02/12/14 showed bilateral interstitial infiltrates-right more than left, which were persistent. She reports a nonproductive cough and denies wheezing. Is no history of fevers or weight loss.  There is no history of environmental exposure.  She did not desaturate on exertion.  PFT - no obstruction, preserved lung volumes, DLCO 76% HRCT - Nonspecific pattern of interstitial lung disease with  bronchiectasis, subpleural reticulation and architectural  distortion. There are minor areas of alveolitis in the left upper and lower lobes     Review of Systems neg for any significant sore throat, dysphagia, itching, sneezing, nasal congestion or excess/ purulent secretions, fever, chills, sweats, unintended wt loss, pleuritic or exertional cp, hempoptysis, orthopnea pnd or change in chronic leg swelling. Also denies presyncope, palpitations, heartburn, abdominal pain, nausea, vomiting, diarrhea or change in bowel or urinary habits, dysuria,hematuria, rash, arthralgias, visual complaints, headache, numbness weakness or ataxia.     Objective:   Physical Exam  Gen. Pleasant, well-nourished, in no distress ENT - no lesions, no post nasal drip Neck: No JVD, no thyromegaly, no carotid bruits Lungs: no use of accessory muscles, no dullness to percussion, clear without rales or rhonchi  Cardiovascular: Rhythm regular, heart sounds   normal, no murmurs or gallops, no peripheral edema Musculoskeletal: No deformities, no cyanosis or clubbing        Assessment & Plan:

## 2014-04-08 LAB — ANA: ANA: NEGATIVE

## 2014-04-10 ENCOUNTER — Telehealth: Payer: Self-pay | Admitting: Pulmonary Disease

## 2014-04-10 NOTE — Telephone Encounter (Signed)
Called spoke with pt. Aware some results are not final yet according to epic but will forward to RA. Please advise thanks

## 2014-04-12 NOTE — Telephone Encounter (Signed)
Serology neg Await hypersens panel - takes a few days

## 2014-04-13 LAB — HYPERSENSITIVITY PNUEMONITIS PROFILE

## 2014-04-13 NOTE — Telephone Encounter (Signed)
I spoke with patient about results and she verbalized understanding and had no questions 

## 2014-04-15 ENCOUNTER — Telehealth: Payer: Self-pay | Admitting: Pulmonary Disease

## 2014-04-15 NOTE — Telephone Encounter (Signed)
Result Note     hypersens panel neg     lmtcb x1

## 2014-04-16 NOTE — Telephone Encounter (Signed)
Results have been explained to patient, pt expressed understanding. Nothing further needed.  

## 2014-04-16 NOTE — Telephone Encounter (Signed)
lmtcb x2 

## 2014-04-16 NOTE — Telephone Encounter (Signed)
Pt returned call & can be reached at [phone].  Sarah Escobar

## 2014-04-29 ENCOUNTER — Other Ambulatory Visit: Payer: Self-pay | Admitting: Pulmonary Disease

## 2014-05-05 ENCOUNTER — Telehealth: Payer: Self-pay | Admitting: Pulmonary Disease

## 2014-05-05 MED ORDER — FLUTICASONE FUROATE-VILANTEROL 100-25 MCG/INH IN AEPB: INHALATION_SPRAY | RESPIRATORY_TRACT | Status: DC

## 2014-05-05 NOTE — Telephone Encounter (Signed)
[phone] ext [phone] returning call

## 2014-05-05 NOTE — Telephone Encounter (Signed)
lmomtcb x1 for pt 

## 2014-05-05 NOTE — Telephone Encounter (Signed)
Called spoke with pt. Aware RX has been sent. Nothing further needed 

## 2014-05-11 ENCOUNTER — Telehealth: Payer: Self-pay | Admitting: Pulmonary Disease

## 2014-05-11 NOTE — Telephone Encounter (Signed)
lmomtcb x1 for pt 

## 2014-05-12 NOTE — Telephone Encounter (Signed)
I spoke with the pt and she states that [facility] needs clarification on breo instructions and asks that we call them at the # given to clarify. I ATC and they do not open until 9am. WCB. [place], CMA

## 2014-05-13 NOTE — Telephone Encounter (Signed)
I called [facility]. Clarified RX. Advised breo is 1 puff QD x 90 day supply. Left detailed message onnamed VM advising of above and tcb if needed.

## 2014-05-18 LAB — PULMONARY FUNCTION TEST
DL/VA % pred: 93 %
DL/VA: 4.48 ml/min/mmHg/L
DLCO unc % pred: 76 %
DLCO unc: 18.56 ml/min/mmHg
FEF 25-75 Post: 5.32 L/sec
FEF 25-75 Pre: 5.12 L/sec
FEF2575-%CHANGE-POST: 3 %
FEF2575-%Pred-Post: 218 %
FEF2575-%Pred-Pre: 210 %
FEV1-%CHANGE-POST: 5 %
FEV1-%PRED-POST: 91 %
FEV1-%Pred-Pre: 86 %
FEV1-POST: 2.38 L
FEV1-PRE: 2.27 L
FEV1FVC-%CHANGE-POST: 4 %
FEV1FVC-%PRED-PRE: 114 %
FEV6-%Change-Post: 0 %
FEV6-%PRED-POST: 79 %
FEV6-%Pred-Pre: 78 %
FEV6-PRE: 2.54 L
FEV6-Post: 2.56 L
FEV6FVC-%PRED-PRE: 104 %
FEV6FVC-%Pred-Post: 104 %
FVC-%Change-Post: 1 %
FVC-%Pred-Post: 76 %
FVC-%Pred-Pre: 75 %
FVC-POST: 2.57 L
FVC-Pre: 2.54 L
POST FEV1/FVC RATIO: 93 %
POST FEV6/FVC RATIO: 100 %
Pre FEV1/FVC ratio: 89 %
Pre FEV6/FVC Ratio: 100 %
RV % PRED: 45 %
RV: 0.88 L
TLC % PRED: 86 %
TLC: 4.39 L

## 2014-05-28 ENCOUNTER — Other Ambulatory Visit: Payer: Self-pay | Admitting: Pulmonary Disease

## 2014-07-01 ENCOUNTER — Other Ambulatory Visit: Payer: Self-pay | Admitting: Pulmonary Disease

## 2014-07-06 ENCOUNTER — Other Ambulatory Visit: Payer: Self-pay | Admitting: Internal Medicine

## 2014-07-09 ENCOUNTER — Telehealth: Payer: Self-pay | Admitting: Internal Medicine

## 2014-07-09 DIAGNOSIS — M545 Low back pain, unspecified: Secondary | ICD-10-CM

## 2014-07-09 NOTE — Telephone Encounter (Signed)
Patient is requesting med refill of flexeril

## 2014-07-10 MED ORDER — CYCLOBENZAPRINE HCL 5 MG PO TABS: ORAL_TABLET | ORAL | Status: DC

## 2014-07-10 NOTE — Telephone Encounter (Signed)
Called pt no answer LMOM with md response. [facility]...Sarah Escobar

## 2014-07-10 NOTE — Telephone Encounter (Signed)
#  14 of 5 mg pills ; 1-2 qhs prn  Non operative Back Specialty referral if symptoms persist or progress

## 2014-07-17 ENCOUNTER — Other Ambulatory Visit: Payer: Self-pay | Admitting: Internal Medicine

## 2014-07-27 ENCOUNTER — Encounter: Payer: Self-pay | Admitting: Family Medicine

## 2014-07-27 ENCOUNTER — Ambulatory Visit (INDEPENDENT_AMBULATORY_CARE_PROVIDER_SITE_OTHER): Payer: BC Managed Care – PPO | Admitting: Family Medicine

## 2014-07-27 ENCOUNTER — Other Ambulatory Visit: Payer: Self-pay | Admitting: Internal Medicine

## 2014-07-27 VITALS — BP 120/72 | HR 76 | Temp 98.2°F | Wt 139.0 lb

## 2014-07-27 DIAGNOSIS — J0110 Acute frontal sinusitis, unspecified: Secondary | ICD-10-CM

## 2014-07-27 DIAGNOSIS — J45901 Unspecified asthma with (acute) exacerbation: Secondary | ICD-10-CM

## 2014-07-27 MED ORDER — AMOXICILLIN-POT CLAVULANATE 875-125 MG PO TABS: 1.0000 | ORAL_TABLET | Freq: Two times a day (BID) | ORAL | Status: DC

## 2014-07-27 MED ORDER — PREDNISONE 10 MG PO TABS: ORAL_TABLET | ORAL | Status: DC

## 2014-07-27 NOTE — Progress Notes (Signed)
  Subjective:     Sarah Escobar is a 58 y.o. female who presents for evaluation of sinus pain. Symptoms include: congestion, cough, facial pain, headaches, nasal congestion, post nasal drip, sinus pressure and sore throat. Onset of symptoms was 5 days ago. Symptoms have been gradually worsening since that time. Past history is significant for bronchiectasis. Patient is a former smoker. The following portions of the patient's history were reviewed and updated as appropriate: allergies, current medications, past family history, past medical history, past social history, past surgical history and problem list.  Review of Systems Pertinent items are noted in HPI.   Objective:    BP 120/72  Pulse 76  Temp(Src) 98.2 F (36.8 C) (Oral)  Wt 139 lb (63.05 kg)  SpO2 95% General appearance: alert, cooperative, appears stated age and no distress Ears: normal TM's and external ear canals both ears Nose: green discharge, moderate congestion, turbinates red, swollen, sinus tenderness bilateral Throat: abnormal findings: marked oropharyngeal erythema Neck: moderate anterior cervical adenopathy, supple, symmetrical, trachea midline and thyroid not enlarged, symmetric, no tenderness/mass/nodules Lungs: diminished breath sounds bilaterally Heart: S1, S2 normal    Assessment:    Acute bacterial sinusitis Asthmatic bronchitis.   Plan:    Nasal steroids per medication orders. Antihistamines per medication orders. Augmentin per medication orders. pred taper

## 2014-07-27 NOTE — Progress Notes (Signed)
Pre visit review using our clinic review tool, if applicable. No additional management support is needed unless otherwise documented below in the visit note. 

## 2014-07-27 NOTE — Patient Instructions (Signed)

## 2014-07-27 NOTE — Telephone Encounter (Signed)
This refill request; symptoms should be evaluated as on going issue. May need imaging or referral to specialist

## 2014-07-28 ENCOUNTER — Telehealth: Payer: Self-pay | Admitting: Internal Medicine

## 2014-07-28 NOTE — Telephone Encounter (Signed)
Pt called back to check the status of her cyclobenzaprine (FLEXERIL) 5 MG tablet re-fill.  I advised pt that she needed an OV, per Dr. Clayborn Heron notes, and she stated that she will contact her other doctor for the request.

## 2014-07-31 ENCOUNTER — Ambulatory Visit: Payer: BC Managed Care – PPO | Admitting: Pulmonary Disease

## 2014-08-11 ENCOUNTER — Telehealth: Payer: Self-pay | Admitting: Family Medicine

## 2014-08-11 MED ORDER — CEFUROXIME AXETIL 500 MG PO TABS: 500.0000 mg | ORAL_TABLET | Freq: Two times a day (BID) | ORAL | Status: DC

## 2014-08-11 NOTE — Telephone Encounter (Signed)
Really should not be on steroids again ceftin 500 mg 1 po bid x 10 days-- needs ov if no better after that

## 2014-08-11 NOTE — Telephone Encounter (Signed)
Please advise      KP 

## 2014-08-11 NOTE — Telephone Encounter (Signed)
Caller name: Jerika  Relation to pt: self Call back number: [phone] [place]: [facility] [phone]   Reason for call:   Pt feels sinus infection is not completely gone pt was last seen in office  07/27/14. Pt requesting a refill predniSONE (DELTASONE) 10 MG tablet & amoxicillin-clavulanate (AUGMENTIN) 875-125 MG per tablet

## 2014-08-11 NOTE — Telephone Encounter (Signed)
Patient has been ,made aware and voiced understanding, Rx sent and she will follow up prn.    KP

## 2014-08-14 ENCOUNTER — Other Ambulatory Visit: Payer: Self-pay

## 2014-08-14 MED ORDER — FLUTICASONE PROPIONATE 50 MCG/ACT NA SUSP: NASAL | Status: DC

## 2014-08-15 ENCOUNTER — Encounter: Payer: Self-pay | Admitting: Internal Medicine

## 2014-08-21 ENCOUNTER — Other Ambulatory Visit: Payer: Self-pay | Admitting: Pulmonary Disease

## 2014-08-24 ENCOUNTER — Ambulatory Visit: Payer: BC Managed Care – PPO | Admitting: Internal Medicine

## 2014-08-25 ENCOUNTER — Encounter: Payer: Self-pay | Admitting: Pulmonary Disease

## 2014-08-25 ENCOUNTER — Ambulatory Visit (INDEPENDENT_AMBULATORY_CARE_PROVIDER_SITE_OTHER): Payer: BC Managed Care – PPO | Admitting: Pulmonary Disease

## 2014-08-25 VITALS — BP 132/82 | HR 71 | Temp 97.3°F | Ht 64.0 in | Wt 140.0 lb

## 2014-08-25 DIAGNOSIS — Z23 Encounter for immunization: Secondary | ICD-10-CM

## 2014-08-25 DIAGNOSIS — J8410 Pulmonary fibrosis, unspecified: Secondary | ICD-10-CM

## 2014-08-25 DIAGNOSIS — J849 Interstitial pulmonary disease, unspecified: Secondary | ICD-10-CM

## 2014-08-25 MED ORDER — PREDNISONE 10 MG PO TABS: ORAL_TABLET | ORAL | Status: DC

## 2014-08-25 MED ORDER — DOXYCYCLINE HYCLATE 100 MG PO TABS: 100.0000 mg | ORAL_TABLET | Freq: Every day | ORAL | Status: DC

## 2014-08-25 NOTE — Assessment & Plan Note (Signed)
You have bronchiectasis DOxy 100 daily x 10 days Take probiotic with this Prednisone 10 mg tabs  Take 2 tabs daily with food x 5ds, then 1 tab daily with food x 5ds then STOP Call if breathing worse or change in color of sputum CT chest in march 2016 Flu shot today prevnar  Ok to come off breo

## 2014-08-25 NOTE — Progress Notes (Signed)
   Subjective:    Patient ID: Sarah Escobar, female    DOB: Oct 13, 1956, 58 y.o.   MRN: [id number]  HPI  PCP - Linna Darner   58 year old ex-smoker with asthmatic bronchitis & bronchiectasis.  She works in Geologist, engineering at [employer].  She smoked a pack per day for about 30-pack-years before quitting in 2003. She was given a diagnosis of asthma for more than 5 years and maintained on dulera and Spiriva. She developed sinusitis and bronchitis symptoms in January, requiring to urgent care visits and required 2 rounds of antibiotics and steroids.    Reports repeated sinus infections  08/25/14  Chief Complaint  Patient presents with  . Follow-up    3 mo ILD f/u. Pt c/o head congestion(sinus related)   34m FU  She reports a nonproductive cough and denies wheezing.  C/o head congestion Remains on breo  Sinus infection - 07/27/14 - augmentin + pred taper   Significant tests/ events  She did not desaturate on exertion.  PFT 04/2014  - no obstruction, preserved lung volumes, DLCO 76%  Chest x-ray on 3/5 and 02/12/14 showed bilateral interstitial infiltrates-right more than left, which were persistent. HRCT 02/2014- Nonspecific pattern of interstitial lung disease with bronchiectasis, subpleural reticulation and architectural  distortion. There are minor areas of alveolitis in the left upper and lower lobes Serology neg  hypersens panel neg   Review of Systems neg for any significant sore throat, dysphagia, itching, sneezing, nasal congestion or excess/ purulent secretions, fever, chills, sweats, unintended wt loss, pleuritic or exertional cp, hempoptysis, orthopnea pnd or change in chronic leg swelling. Also denies presyncope, palpitations, heartburn, abdominal pain, nausea, vomiting, diarrhea or change in bowel or urinary habits, dysuria,hematuria, rash, arthralgias, visual complaints, headache, numbness weakness or ataxia.     Objective:   Physical Exam  Gen. Pleasant, well-nourished, in  no distress, normal affect ENT - no lesions, no post nasal drip Neck: No JVD, no thyromegaly, no carotid bruits Lungs: no use of accessory muscles, no dullness to percussion, fine rales RLL,no rhonchi  Cardiovascular: Rhythm regular, heart sounds  normal, no murmurs or gallops, no peripheral edema Abdomen: soft and non-tender, no hepatosplenomegaly, BS normal. Musculoskeletal: No deformities, no cyanosis or clubbing Neuro:  alert, non focal       Assessment & Plan:

## 2014-08-25 NOTE — Patient Instructions (Signed)
You have bronchiectasis DOxy 100 daily x 10 days Take probiotic with this Prednisone 10 mg tabs  Take 2 tabs daily with food x 5ds, then 1 tab daily with food x 5ds then STOP Call if breathing worse or change in color of sputum CT chest in march 2016 Flu shot today prevnar

## 2014-08-31 ENCOUNTER — Telehealth: Payer: Self-pay | Admitting: *Deleted

## 2014-08-31 NOTE — Telephone Encounter (Signed)
Left msg on triage stating she has made appt to see md concerning her thyroid. She is currently taking prednisone for sinus issues. Wanting to know if md check her TSH will the prednisone effect the test results...Johny Chess

## 2014-08-31 NOTE — Telephone Encounter (Signed)
no

## 2014-08-31 NOTE — Telephone Encounter (Signed)
Notified pt with md response.../lmb 

## 2014-09-08 ENCOUNTER — Ambulatory Visit (INDEPENDENT_AMBULATORY_CARE_PROVIDER_SITE_OTHER): Payer: BC Managed Care – PPO | Admitting: Internal Medicine

## 2014-09-08 ENCOUNTER — Encounter: Payer: Self-pay | Admitting: Internal Medicine

## 2014-09-08 ENCOUNTER — Other Ambulatory Visit (INDEPENDENT_AMBULATORY_CARE_PROVIDER_SITE_OTHER): Payer: BC Managed Care – PPO

## 2014-09-08 VITALS — BP 110/80 | HR 83 | Temp 98.3°F | Resp 14 | Wt 142.1 lb

## 2014-09-08 DIAGNOSIS — R5383 Other fatigue: Secondary | ICD-10-CM

## 2014-09-08 DIAGNOSIS — L659 Nonscarring hair loss, unspecified: Secondary | ICD-10-CM

## 2014-09-08 DIAGNOSIS — M791 Myalgia: Secondary | ICD-10-CM

## 2014-09-08 DIAGNOSIS — M609 Myositis, unspecified: Secondary | ICD-10-CM

## 2014-09-08 DIAGNOSIS — IMO0001 Reserved for inherently not codable concepts without codable children: Secondary | ICD-10-CM

## 2014-09-08 DIAGNOSIS — G479 Sleep disorder, unspecified: Secondary | ICD-10-CM

## 2014-09-08 LAB — CBC WITH DIFFERENTIAL/PLATELET
BASOS PCT: 0.6 % (ref 0.0–3.0)
Basophils Absolute: 0.1 10*3/uL (ref 0.0–0.1)
EOS ABS: 0.2 10*3/uL (ref 0.0–0.7)
Eosinophils Relative: 2.3 % (ref 0.0–5.0)
HCT: 43.2 % (ref 36.0–46.0)
Hemoglobin: 14.8 g/dL (ref 12.0–15.0)
LYMPHS ABS: 3 10*3/uL (ref 0.7–4.0)
Lymphocytes Relative: 29.4 % (ref 12.0–46.0)
MCHC: 34.3 g/dL (ref 30.0–36.0)
MCV: 97.2 fl (ref 78.0–100.0)
MONO ABS: 0.7 10*3/uL (ref 0.1–1.0)
Monocytes Relative: 7.1 % (ref 3.0–12.0)
NEUTROS PCT: 60.6 % (ref 43.0–77.0)
Neutro Abs: 6.2 10*3/uL (ref 1.4–7.7)
Platelets: 249 10*3/uL (ref 150.0–400.0)
RBC: 4.44 Mil/uL (ref 3.87–5.11)
RDW: 12.7 % (ref 11.5–15.5)
WBC: 10.2 10*3/uL (ref 4.0–10.5)

## 2014-09-08 NOTE — Patient Instructions (Signed)
To prevent sleep dysfunction follow these instructions for sleep hygiene. Do not read, watch TV, or eat in bed. Do not get into bed until you are ready to turn off the light &  to go to sleep. Do not ingest stimulants ( decongestants, diet pills, nicotine, caffeine) after the evening meal.Do not take daytime naps.Cardiovascular exercise, this can be as simple a program as walking, is recommended 30-45 minutes 3-4 times per week. If you're not exercising you should take 6-8 weeks to build up to this level.  Your next office appointment will be determined based upon review of your pending labs &/ or x-rays. Those instructions will be transmitted to you through My Chart  OR  by mail;whichever process is your choice to receive results & recommendations . Followup as needed for your acute issue. Please report any significant change in your symptoms.

## 2014-09-08 NOTE — Progress Notes (Signed)
   Subjective:    Patient ID: Sarah Escobar, female    DOB: Aug 15, 1956, 58 y.o.   MRN: [id number]  HPI     She describes difficulty sleeping almost nightly. It is described as both difficulty going to sleep as well as staying asleep.  She denies reading, eating, or watching TV in bed.  She never gets more than 6 hours sleep and symptoms occur almost nightly.  She denies any associated symptoms or signs of sleep apnea or excessive snoring.  Five # weight gain and significant hair loss. She also noted her eyelashes are actually falling out. There are also straight rather than curved.  Nails are brittle with ridging.    Review of Systems   She denies exertional dyspnea or chest pain.  She also denies any constitutional symptoms of fever, chills, or sweats  She has recently had a sinus infection for which she received antibiotics & steroids.  She describes significant itchy, watery eyes for which she uses allergy eyedrops  She has no hoarseness or difficulty swallowing  She has some constipation.  She also describes some joint and muscle pain.She is on a statin  She has recurrent rash over the upper eyelids & slightly on the lower eyelids for which she's been using cortisone. This does help some.     Objective:   Physical Exam  Positive or pertinent findings include: Bilateral ptosis. She has an upper dental plate lower partial. Tympanic membranes are scarred. An S4 is noted without murmurs or gallops Chest slight clubbing of the nailbeds. Dorsalis pedis pulses are decreased. Deep tendon reflexes are 0-1/2+.   Gen.:  well-nourished; in no acute distress Eyes: Extraocular motion intact; no lid lag or proptosis ,nystagmus Neck: full ROM; no masses ; thyroid normal  Heart: Normal rhythm and rate ; no extra heart sounds Lungs: Chest clear to auscultation without rales,rales, wheezes Neuro:no tremor  Skin: Warm and dry without significant lesions or rashes; no  onycholysis Lymphatic: no cervical or axillary LA Psych: Normally communicative and interactive; no abnormal mood or affect clinically.          Assessment & Plan:   #1 fatigue CBC & dif, BMET,hepatic panel,TSH  #2 myalgia, on statin. CK, vitamin D level  #3 sleep disorder. Ambien 5 mg q 3rd night prn; no refill w/o sleep evaluation. See orders & AVS.

## 2014-09-08 NOTE — Progress Notes (Signed)
Pre visit review using our clinic review tool, if applicable. No additional management support is needed unless otherwise documented below in the visit note. 

## 2014-09-09 LAB — BASIC METABOLIC PANEL
BUN: 8 mg/dL (ref 6–23)
CHLORIDE: 101 meq/L (ref 96–112)
CO2: 26 meq/L (ref 19–32)
Calcium: 9.8 mg/dL (ref 8.4–10.5)
Creatinine, Ser: 0.9 mg/dL (ref 0.4–1.2)
GFR: 72.88 mL/min (ref >60.00)
Glucose, Bld: 65 mg/dL — ABNORMAL LOW (ref 70–99)
Potassium: 4.2 mEq/L (ref 3.5–5.1)
Sodium: 137 mEq/L (ref 135–145)

## 2014-09-09 LAB — HEPATIC FUNCTION PANEL
ALT: 34 U/L (ref 0–35)
AST: 30 U/L (ref 0–37)
Albumin: 3.7 g/dL (ref 3.5–5.2)
Alkaline Phosphatase: 73 U/L (ref 39–117)
BILIRUBIN DIRECT: 0.1 mg/dL (ref 0.0–0.3)
TOTAL PROTEIN: 7 g/dL (ref 6.0–8.3)
Total Bilirubin: 0.6 mg/dL (ref 0.2–1.2)

## 2014-09-09 LAB — TSH: TSH: 2.02 u[IU]/mL (ref 0.35–4.50)

## 2014-09-09 LAB — T4, FREE: Free T4: 2.48 ng/dL — ABNORMAL HIGH (ref 0.60–1.60)

## 2014-09-09 LAB — CK: Total CK: 186 U/L — ABNORMAL HIGH (ref 7–177)

## 2014-09-09 LAB — VITAMIN D 25 HYDROXY (VIT D DEFICIENCY, FRACTURES): VITD: 66.09 ng/mL (ref 30.00–100.00)

## 2014-09-10 ENCOUNTER — Other Ambulatory Visit: Payer: Self-pay | Admitting: Internal Medicine

## 2014-09-21 ENCOUNTER — Other Ambulatory Visit: Payer: Self-pay | Admitting: Internal Medicine

## 2014-09-21 ENCOUNTER — Telehealth: Payer: Self-pay | Admitting: Internal Medicine

## 2014-09-21 DIAGNOSIS — R5383 Other fatigue: Secondary | ICD-10-CM

## 2014-09-21 DIAGNOSIS — G479 Sleep disorder, unspecified: Secondary | ICD-10-CM

## 2014-09-21 DIAGNOSIS — R946 Abnormal results of thyroid function studies: Secondary | ICD-10-CM

## 2014-09-21 MED ORDER — CLONAZEPAM 0.5 MG PO TABS: 0.5000 mg | ORAL_TABLET | Freq: Every evening | ORAL | Status: DC | PRN

## 2014-09-21 NOTE — Telephone Encounter (Signed)
Patient has been notified that referral has been placed and Clonazepam called to [facility]

## 2014-09-21 NOTE — Telephone Encounter (Signed)
Clonazepam 0.5 mg qhs prn for sleep # 30

## 2014-09-21 NOTE — Telephone Encounter (Signed)
Pt requesting referral endocrinologist from Dr Linna Darner and his suggestion who to go to. Please advise

## 2014-09-21 NOTE — Telephone Encounter (Signed)
Phone call back to patient. She is aware Dr Linna Darner is out of the office today and will receive a response sometime tomorrow. She requests to be notified via her work number. I verified that phone number with her today.

## 2014-09-21 NOTE — Telephone Encounter (Signed)
Pt wants to try the meds that Dr Linna Darner suggested she could take to help her sleep at night but not the [place]. Please fill.

## 2014-09-25 ENCOUNTER — Encounter: Payer: Self-pay | Admitting: Internal Medicine

## 2014-09-25 ENCOUNTER — Ambulatory Visit (INDEPENDENT_AMBULATORY_CARE_PROVIDER_SITE_OTHER): Payer: BC Managed Care – PPO | Admitting: Internal Medicine

## 2014-09-25 VITALS — BP 110/62 | HR 88 | Temp 97.6°F | Resp 12 | Ht 65.5 in | Wt 143.0 lb

## 2014-09-25 DIAGNOSIS — L659 Nonscarring hair loss, unspecified: Secondary | ICD-10-CM

## 2014-09-25 DIAGNOSIS — R7989 Other specified abnormal findings of blood chemistry: Secondary | ICD-10-CM | POA: Insufficient documentation

## 2014-09-25 NOTE — Progress Notes (Signed)
Patient ID: Sarah Escobar, female   DOB: 1956/08/22, 58 y.o.   MRN: [id number]   HPI  Sarah Escobar is a 58 y.o.-year-old female, referred by her PCP, Dr. Linna Darner, for evaluation for abnormal thyroid tests (normal TSH, increased fT4).  She c/o fatigue and hair loss at last visit with PCP >> TFTs were checked >> fT4 elevated.  I reviewed pt's thyroid tests: Lab Results  Component Value Date   TSH 2.02 09/08/2014   TSH 1.47 10/29/2013   TSH 1.71 10/28/2012   TSH 2.53 05/23/2011   TSH 2.05 08/31/2009   FREET4 2.48* 09/08/2014    Pt c/o: - + fatigue >> started in 02/2014 when she had a URI + severe bronchitis >> saw Dr Elsworth Soho >> dx with bronchiectasis >> fatigue got initially better then again worsened - + hair loss >> improved with the Hair skin nails vitamins - + insomnia - both initial and terminal - + palpitations when lays down in bed at night  - + occasional tremors - + some acid reflux - + weight loss: in 02/2013 : 12 lbs >> started Ensure >> gained  - no excessive sweating/heat intolerance, lately more cold intolerance - + depression, no anxiety - no hyperdefecation, + constipation - + stomach enlarging  She denies feeling nodules in neck, + occasional hoarseness, no dysphagia/odynophagia, SOB with lying down.  Pt does have a FH of thyroid ds: mother. No FH of thyroid cancer. No h/o radiation tx to head or neck.  No seaweed or kelp, no recent contrast studies. No steroid use recently, but had Prednisone 1.5 mo ago, but takes Norco. No herbal supplements.   ROS: Constitutional: see HPI Eyes: no blurry vision, no xerophthalmia ENT: no sore throat, no nodules palpated in throat, no dysphagia/odynophagia, no hoarseness Cardiovascular: no CP/SOB/+ palpitations/no leg swelling Respiratory: + cough/no SOB/+ wheezing Gastrointestinal: no N/V/D/+ C Musculoskeletal:+ both muscle/joint aches Skin: no rashes, + itching Neurological: no tremors/numbness/tingling/dizziness, +  HA Psychiatric: + depression/no anxiety + low libido  Past Medical History  Diagnosis Date  . Hyperlipidemia     LDL goal = < 100  . Hypertension   . Asthma     adult onset  . Depression   . Sciatica of left side     intermittent  . Allergy   . Personal history of adenomatous colonic polyps/FHx colon cancer sister and father     [name]   Past Surgical History  Procedure Laterality Date  . Total abdominal hysterectomy w/ bilateral salpingoophorectomy  2003    endometriosis   . Nasal sinus surgery  [phone]    X 3  . Cervical fusion  1999    Dr Arnoldo Morale, [place]  . Colonoscopy w/ polypectomy  multiple    adenomas; [name]; last 2013  . Discetomy lumbar  02/19/13    L1&2; Dr Arnoldo Morale, [place]   History   Social History  . Marital Status: Married    Spouse Name: N/A    Number of Children: 1   Occupational History  . Purchasing agent [employer]   Social History Main Topics  . Smoking status: Former Smoker -- 1.00 packs/day for 30 years    Types: Cigarettes    Quit date: 12/04/2001  . Smokeless tobacco: Never Used     Comment:    . Alcohol Use: Yes     Comment: occasionally  . Drug Use: No   Current Outpatient Prescriptions on File Prior to Visit  Medication Sig Dispense Refill  .  buPROPion (WELLBUTRIN XL) 300 MG 24 hr tablet TAKE 1 TABLET EVERY MORNING  90 tablet  0  . Calcium Carb-Cholecalciferol (CALCIUM + D3 PO) Take by mouth 2 (two) times daily.      . Est Estrogens-Methyltest (ESTRATEST H.S. PO) Take by mouth daily.      . fexofenadine (ALLEGRA) 180 MG tablet Take 180 mg by mouth daily.      . Flaxseed, Linseed, (FLAXSEED OIL) 1000 MG CAPS Take by mouth 2 (two) times daily.      . fluticasone (FLONASE) 50 MCG/ACT nasal spray USE ONE SPRAY IN EACH NOSTRIL TWICE DAILY AS NEEDED   16 g  5  . Fluticasone Furoate-Vilanterol (BREO ELLIPTA) 100-25 MCG/INH AEPB INAHLE 1 PUFF BY MOUTH INTO THE LUNGS ONE DAY OR ONE DOSE.  180 each  2  .  HYDROcodone-acetaminophen (NORCO) 10-325 MG per tablet Take 1 tablet by mouth every 6 (six) hours as needed for pain.      Marland Kitchen ipratropium-albuterol (DUONEB) 0.5-2.5 (3) MG/3ML SOLN INHALE ONE VIAL VIA NEBULIZER EVERY SIX HOURS AS NEEDED.   360 mL  1  . metoprolol tartrate (LOPRESSOR) 25 MG tablet TAKE ONE-HALF (1/2) TABLET TWICE A DAY  90 tablet  3  . Multiple Vitamins-Minerals (ONE-A-DAY 50 PLUS PO) Take by mouth daily.      . Nutritional Supplements (ENSURE COMPLETE SHAKE) LIQD Please use one shake every other day.  16 Bottle  2  . simvastatin (ZOCOR) 20 MG tablet TAKE 1 TABLET EVERY EVENING  90 tablet  1  . clonazePAM (KLONOPIN) 0.5 MG tablet Take 1 tablet (0.5 mg total) by mouth at bedtime as needed (sleep).  30 tablet  0  . cyclobenzaprine (FLEXERIL) 5 MG tablet 1-2 qhs prn  14 tablet  0  . doxycycline (VIBRA-TABS) 100 MG tablet Take 1 tablet (100 mg total) by mouth daily.  10 tablet  0   No current facility-administered medications on file prior to visit.   Allergies  Allergen Reactions  . Flagyl [Metronidazole] Other (See Comments)    Nerve pain  . Levofloxacin     rash  . Loperamide Hcl     rash  . Neomycin-Bacitracin Zn-Polymyx     rash  . Ofloxacin     rash  . Oxycodone Hcl     rash  . Sulfonamide Derivatives     hives  . Band-Aid Liquid Bandage [New Skin]     Regular band-aid cause itching and redness   Family History  Problem Relation Age of Onset  . Colon cancer Father   . Heart attack Father 59  . Diabetes Father   . Stroke Father 31  . Colon cancer Sister   . Asthma Sister   . Asthma Mother   . Colitis Mother   . Cirrhosis Mother     non alcoholic, fatty liver  . Asthma Maternal Grandmother   . Asthma Maternal Aunt   . Heart attack Sister 96  . Cirrhosis Brother     non alcoholic  . Kidney failure      2 bro, 1 sister ? from HTN  . Anemia Brother      Spherocytosis, hereditrary   PE: BP 110/62  Pulse 88  Temp(Src) 97.6 F (36.4 C) (Oral)  Resp  12  Ht 5' 5.5" (1.664 m)  Wt 143 lb (64.864 kg)  BMI 23.43 kg/m2  SpO2 97% Wt Readings from Last 3 Encounters:  09/25/14 143 lb (64.864 kg)  09/08/14 142 lb 2 oz (64.467 kg)  08/25/14 140 lb (63.504 kg)   Constitutional: normal weight, in NAD Eyes: PERRLA, EOMI, no exophthalmos, no lid lag, no stare ENT: moist mucous membranes, no thyromegaly, no cervical lymphadenopathy Cardiovascular: RRR, No MRG Respiratory: CTA B Gastrointestinal: abdomen soft, NT, ND, BS+ Musculoskeletal: no deformities, strength intact in all 4 Skin: moist, warm, no rashes Neurological: no tremor with outstretched hands, DTR normal in all 4  ASSESSMENT: 1. High fT4, normal TSH  2. Hair loss  PLAN:  1. Patient with a recently found high fT4 in the context of a normal TSH and a myriad of sxs which can be associated to HTyr. She describes that sxs started after an URI with subsequent bronchitis 7 mo ago. She initially lost weight, then gained it back. She had and still has palpitations. Has hair loss, which has improved. She does not appear to have exogenous causes for thyrotoxicosis - We discussed that the most likely cause for her abnormal labs is resolving thyroiditis. The clinical picture supports this dx. The main argument in favor of the dx is normalization of her TFTs at recheck >> I suggested that we check the TSH, fT3 and fT4 in ~3 weeks from now.  Orders Placed This Encounter  Procedures  . TSH  . T4, free  . T3, free  . B12  If the tests remain abnormal, we may need eval for Graves ds., pituitary TSH-producing tumor, thyroid hh resistance sd., however, these are very unlikely. If the tests remain abnormal, we may also need an uptake and scan. - continue Lopressor for now (12.5 mg 2x daily) - I advised her to join my chart to communicate easier, but she declined Return in about 6 months (around 03/27/2015).  2. Hair loss - likely 2/2 initial thyroid hh increase, now improving - will check a B12  vit when she comes back for labs - continue Hair-skin-nails vitamins

## 2014-09-25 NOTE — Patient Instructions (Signed)
Please come back for labs in 3 weeks.  Please come back for a follow-up appointment in 6 months

## 2014-10-16 ENCOUNTER — Other Ambulatory Visit (INDEPENDENT_AMBULATORY_CARE_PROVIDER_SITE_OTHER): Payer: BC Managed Care – PPO

## 2014-10-16 DIAGNOSIS — R7989 Other specified abnormal findings of blood chemistry: Secondary | ICD-10-CM

## 2014-10-16 DIAGNOSIS — L659 Nonscarring hair loss, unspecified: Secondary | ICD-10-CM

## 2014-10-16 LAB — T3, FREE: T3, Free: 3.3 pg/mL (ref 2.3–4.2)

## 2014-10-16 LAB — VITAMIN B12: Vitamin B-12: 863 pg/mL (ref 211–911)

## 2014-10-16 LAB — TSH: TSH: 2.16 u[IU]/mL (ref 0.35–4.50)

## 2014-10-16 LAB — T4, FREE: Free T4: 0.75 ng/dL (ref 0.60–1.60)

## 2014-10-19 ENCOUNTER — Encounter: Payer: Self-pay | Admitting: *Deleted

## 2014-10-19 ENCOUNTER — Other Ambulatory Visit: Payer: Self-pay | Admitting: Internal Medicine

## 2014-10-19 NOTE — Telephone Encounter (Signed)
Dr Linna Darner, refill okay?

## 2014-10-19 NOTE — Telephone Encounter (Signed)
Pt states [facility] has been faxing a request for refill, Buprion (Wellbutrin). Pharmacy has told pt their has been no response from office. Fax [phone] and 90 day supply. Pt has 4 days left of this medication. Pls advise.

## 2014-10-19 NOTE — Telephone Encounter (Signed)
OK X 90 days 

## 2014-10-20 MED ORDER — BUPROPION HCL ER (XL) 300 MG PO TB24: ORAL_TABLET | ORAL | Status: DC

## 2014-10-23 ENCOUNTER — Telehealth: Payer: Self-pay | Admitting: Internal Medicine

## 2014-10-23 NOTE — Telephone Encounter (Signed)
Pt called requesting lab results. Please advise.  

## 2014-10-23 NOTE — Telephone Encounter (Signed)
Patient would like to know the results of lab work and also mail her a copy

## 2014-10-23 NOTE — Telephone Encounter (Signed)
Notes Recorded by Lucius Conn, CMA on 74/14/2395 at 4:57 PM Called pt and left message with pt's husband per Dr Arman Filter result note. He understood and will advise pt. Letter sent as well. Notes Recorded by Philemon Kingdom, MD on 10/16/2014 at [place], can you please call pt: all the labs are great! ( TFTs and B12)

## 2014-10-28 ENCOUNTER — Other Ambulatory Visit: Payer: Self-pay

## 2014-10-28 MED ORDER — CLONAZEPAM 0.5 MG PO TABS: 0.5000 mg | ORAL_TABLET | Freq: Every evening | ORAL | Status: DC | PRN

## 2014-10-28 NOTE — Telephone Encounter (Signed)
Clonazepam has been called to [facility]  

## 2014-10-28 NOTE — Telephone Encounter (Signed)
OK X1 

## 2014-11-03 ENCOUNTER — Ambulatory Visit (INDEPENDENT_AMBULATORY_CARE_PROVIDER_SITE_OTHER): Payer: BC Managed Care – PPO | Admitting: Internal Medicine

## 2014-11-03 ENCOUNTER — Encounter: Payer: Self-pay | Admitting: Internal Medicine

## 2014-11-03 ENCOUNTER — Other Ambulatory Visit (INDEPENDENT_AMBULATORY_CARE_PROVIDER_SITE_OTHER): Payer: BC Managed Care – PPO

## 2014-11-03 VITALS — BP 110/60 | HR 75 | Temp 97.8°F | Resp 12 | Ht 66.0 in | Wt 141.5 lb

## 2014-11-03 DIAGNOSIS — Z0000 Encounter for general adult medical examination without abnormal findings: Secondary | ICD-10-CM

## 2014-11-03 DIAGNOSIS — Z0189 Encounter for other specified special examinations: Secondary | ICD-10-CM

## 2014-11-03 DIAGNOSIS — E782 Mixed hyperlipidemia: Secondary | ICD-10-CM

## 2014-11-03 LAB — LIPID PANEL
CHOL/HDL RATIO: 4
Cholesterol: 133 mg/dL (ref 0–200)
HDL: 37.3 mg/dL — AB (ref >39.00)
LDL CALC: 78 mg/dL (ref 0–99)
NonHDL: 95.7
Triglycerides: 89 mg/dL (ref 0.0–149.0)
VLDL: 17.8 mg/dL (ref 0.0–40.0)

## 2014-11-03 NOTE — Progress Notes (Signed)
Pre visit review using our clinic review tool, if applicable. No additional management support is needed unless otherwise documented below in the visit note. 

## 2014-11-03 NOTE — Patient Instructions (Signed)
Your next office appointment will be determined based upon review of your pending labs . Those instructions will be transmitted to you  by mail. 

## 2014-11-03 NOTE — Progress Notes (Signed)
   Subjective:    Patient ID: Sarah Escobar, female    DOB: 1956-11-14, 58 y.o.   MRN: [id number]  HPI  She is here for a physical;acute issues include undiagnosed thyroid dysfunction, ? Thyroiditis as per Dr Cruzita Lederer. This has been manifested as some hair loss as well as palpitations. Both have improved.  She is on a heart healthy diet. She walks twice a week approximate 1 mile without symptoms  Advanced cholesterol testing reveals her LDL goal is less than 100, ideally less than 70. There is premature coronary artery disease in a sister.  She has had adenomatous polyps removed on at least 3 occasions; she is under active surveillance. There is a family history of colon cancer in her father and sister.     Review of Systems    Chest pain, palpitations, tachycardia, exertional dyspnea, paroxysmal nocturnal dyspnea, claudication or edema are absent.  Unexplained weight loss, abdominal pain, significant dyspepsia, dysphagia, melena, rectal bleeding, or persistently small caliber stools are denied.     Objective:   Physical Exam Gen.: Healthy and well-nourished in appearance. Alert, appropriate and cooperative throughout exam. Head: Normocephalic without obvious abnormalities;  no alopecia  Eyes: No corneal or conjunctival inflammation noted. Pupils equal round reactive to light and accommodation. Extraocular motion intact.  Ears: External  ear exam reveals no significant lesions or deformities. Canals clear .TMs normal. Hearing is grossly normal bilaterally. Nose: External nasal exam reveals no deformity or inflammation. Nasal mucosa are pink and moist. No lesions or exudates noted.   Mouth: Oral mucosa and oropharynx reveal no lesions or exudates. Upper plate & lower partial Neck: No deformities, masses, or tenderness noted. Range of motion decreased.Thyroid normal Lungs: Normal respiratory effort; chest expands symmetrically. Minor bibasialr rales w/o wheezes or increased work of  breathing. Heart: Normal rate and rhythm. Normal S1 and S2. No gallop, click, or rub. No murmur. Abdomen: Bowel sounds normal; abdomen soft and nontender. No masses, organomegaly or hernias noted. Genitalia: as per Gyn                                  Musculoskeletal/extremities: No deformity or scoliosis noted of  the thoracic or lumbar spine.  No clubbing, cyanosis, edema, or significant extremity  deformity noted.  Range of motion normal . Tone & strength normal. Hand joints normal Fingernail health good. Minor crepitus of knees  Able to lie down & sit up w/o help.  Negative SLR bilaterally Vascular: Carotid, radial artery, dorsalis pedis and  posterior tibial pulses are full and equal. No bruits present. Neurologic: Alert and oriented x3. Deep tendon reflexes symmetrical and normal.  Gait normal     Skin: Intact without suspicious lesions or rashes. Lymph: No cervical, axillary lymphadenopathy present. Psych: Mood and affect are normal. Normally interactive                                                                                        Assessment & Plan:  #1 comprehensive physical exam; no acute findings  Plan: see Orders  & Recommendations

## 2014-11-12 ENCOUNTER — Telehealth: Payer: Self-pay | Admitting: Pulmonary Disease

## 2014-11-12 MED ORDER — PREDNISONE 10 MG PO TABS: ORAL_TABLET | ORAL | Status: DC

## 2014-11-12 NOTE — Telephone Encounter (Signed)
Pt aware of recs from RA and rx sent for prednisone taper to [facility] pharmacy listed and confirmed with patient. Nothing more needed at this time.

## 2014-11-12 NOTE — Telephone Encounter (Signed)
Pt c/o sinus drainage, prod cough (dark green), body aches and wheezing.  Denies fever, sob.  Has rx for Ceftin on hand from Dr Etter Sjogren.  Wants to know if she should start this and alos if we can call in an rx for Prednisone.  Please advise.  Allergies  Allergen Reactions  . Flagyl [Metronidazole] Other (See Comments)    Nerve pain  . Levofloxacin     rash  . Loperamide Hcl     rash  . Neomycin-Bacitracin Zn-Polymyx     rash  . Ofloxacin     rash  . Oxycodone Hcl     rash  . Sulfonamide Derivatives     hives  . Band-Aid Liquid Bandage [New Skin]     Regular band-aid cause itching and redness    Current Outpatient Prescriptions on File Prior to Visit  Medication Sig Dispense Refill  . Biotin 5000 MCG CAPS Take 2 capsules by mouth daily.    Marland Kitchen buPROPion (WELLBUTRIN XL) 300 MG 24 hr tablet TAKE 1 TABLET EVERY MORNING 90 tablet 0  . Calcium Carb-Cholecalciferol (CALCIUM + D3 PO) Take by mouth 2 (two) times daily.    . clonazePAM (KLONOPIN) 0.5 MG tablet Take 1 tablet (0.5 mg total) by mouth at bedtime as needed (sleep). 30 tablet 0  . Docusate Calcium (STOOL SOFTENER PO) Take 2 capsules by mouth daily.    . Est Estrogens-Methyltest (ESTRATEST H.S. PO) Take by mouth daily.    . fexofenadine (ALLEGRA) 180 MG tablet Take 180 mg by mouth daily.    . Flaxseed, Linseed, (FLAXSEED OIL) 1000 MG CAPS Take by mouth 2 (two) times daily.    . fluticasone (FLONASE) 50 MCG/ACT nasal spray USE ONE SPRAY IN EACH NOSTRIL TWICE DAILY AS NEEDED  16 g 5  . Fluticasone Furoate-Vilanterol (BREO ELLIPTA) 100-25 MCG/INH AEPB INAHLE 1 PUFF BY MOUTH INTO THE LUNGS ONE DAY OR ONE DOSE. 180 each 2  . HYDROcodone-acetaminophen (NORCO) 10-325 MG per tablet Take 1 tablet by mouth every 6 (six) hours as needed for pain.    Marland Kitchen ipratropium-albuterol (DUONEB) 0.5-2.5 (3) MG/3ML SOLN INHALE ONE VIAL VIA NEBULIZER EVERY SIX HOURS AS NEEDED.  360 mL 1  . metoprolol tartrate (LOPRESSOR) 25 MG tablet TAKE ONE-HALF (1/2) TABLET  TWICE A DAY 90 tablet 3  . Multiple Vitamins-Minerals (ONE-A-DAY 50 PLUS PO) Take by mouth daily.    . Nutritional Supplements (ENSURE COMPLETE SHAKE) LIQD Please use one shake every other day. 16 Bottle 2  . simvastatin (ZOCOR) 20 MG tablet TAKE 1 TABLET EVERY EVENING 90 tablet 1   No current facility-administered medications on file prior to visit.

## 2014-11-12 NOTE — Telephone Encounter (Signed)
lmomtcb x1 for pt on both #'s listed

## 2014-11-12 NOTE — Telephone Encounter (Signed)
OK to start ceftin Prednisone 10 mg tabs  Take 2 tabs daily with food x 5ds, then 1 tab daily with food x 5ds then STOP

## 2014-11-22 ENCOUNTER — Other Ambulatory Visit: Payer: Self-pay | Admitting: Pulmonary Disease

## 2014-11-30 ENCOUNTER — Other Ambulatory Visit: Payer: Self-pay | Admitting: Internal Medicine

## 2014-11-30 ENCOUNTER — Telehealth: Payer: Self-pay | Admitting: Internal Medicine

## 2014-11-30 DIAGNOSIS — I10 Essential (primary) hypertension: Secondary | ICD-10-CM

## 2014-11-30 MED ORDER — METOPROLOL TARTRATE 25 MG PO TABS: ORAL_TABLET | ORAL | Status: DC

## 2014-11-30 NOTE — Telephone Encounter (Signed)
Prescription has been sent.

## 2014-11-30 NOTE — Telephone Encounter (Signed)
Pt called in requesting refill on her metoprolol tartrate (LOPRESSOR) 25 MG tablet    Send to mail order in [place]

## 2014-11-30 NOTE — Telephone Encounter (Signed)
Pt called back and states metoprolol tartrate needs to go to [address], [place] [address]. [facility]

## 2014-12-10 ENCOUNTER — Other Ambulatory Visit: Payer: Self-pay

## 2014-12-10 MED ORDER — CLONAZEPAM 0.5 MG PO TABS: 0.5000 mg | ORAL_TABLET | Freq: Every evening | ORAL | Status: DC | PRN

## 2014-12-10 NOTE — Telephone Encounter (Signed)
OK X1 

## 2014-12-10 NOTE — Telephone Encounter (Signed)
Clonazepam has been called to [facility] on [address]

## 2015-01-05 ENCOUNTER — Other Ambulatory Visit: Payer: Self-pay | Admitting: Internal Medicine

## 2015-01-05 ENCOUNTER — Other Ambulatory Visit: Payer: Self-pay

## 2015-01-05 MED ORDER — BUPROPION HCL ER (XL) 300 MG PO TB24: ORAL_TABLET | ORAL | Status: DC

## 2015-01-05 NOTE — Telephone Encounter (Signed)
Med last phoned to pharmacy 10/20/14 #90, no refills  Patient last seen 11/03/14

## 2015-01-05 NOTE — Telephone Encounter (Signed)
OK X 3 mos 

## 2015-01-11 ENCOUNTER — Other Ambulatory Visit: Payer: Self-pay

## 2015-01-11 MED ORDER — CLONAZEPAM 0.5 MG PO TABS
0.5000 mg | ORAL_TABLET | Freq: Every evening | ORAL | Status: DC | PRN
Start: 2015-01-11 — End: 2015-02-11

## 2015-01-11 NOTE — Telephone Encounter (Signed)
OK X1 

## 2015-01-11 NOTE — Telephone Encounter (Signed)
Clonazepam has been called to [facility]

## 2015-02-11 ENCOUNTER — Other Ambulatory Visit: Payer: Self-pay | Admitting: Internal Medicine

## 2015-02-11 MED ORDER — CLONAZEPAM 0.5 MG PO TABS: 0.5000 mg | ORAL_TABLET | Freq: Every evening | ORAL | Status: DC | PRN

## 2015-02-11 NOTE — Telephone Encounter (Signed)
Last office visit 11/03/14 01/11/15 med last phoned to pharmacy   MD out of office, okay for refill?

## 2015-02-11 NOTE — Telephone Encounter (Signed)
Patient is requesting refill on generic klonopin to be sent to [facility] on [address].

## 2015-02-11 NOTE — Telephone Encounter (Signed)
Clonazepam has been called to [facility]

## 2015-02-17 ENCOUNTER — Telehealth: Payer: Self-pay | Admitting: Internal Medicine

## 2015-02-17 NOTE — Telephone Encounter (Signed)
I had a note that patient called back. I called her and advised her of Dr [employer] message.

## 2015-02-17 NOTE — Telephone Encounter (Signed)
Patient need refill if medication Norco 10 mg

## 2015-02-17 NOTE — Telephone Encounter (Signed)
Patient is returning your call.  

## 2015-02-17 NOTE — Telephone Encounter (Signed)
This should come from the specialist treating her pain syndrome

## 2015-02-17 NOTE — Telephone Encounter (Signed)
Dr Linna Darner, Faythe [facility] for refill?

## 2015-02-17 NOTE — Telephone Encounter (Signed)
Left message for patient to return call.

## 2015-02-17 NOTE — Telephone Encounter (Signed)
See previous phone note from today  

## 2015-02-24 ENCOUNTER — Ambulatory Visit (INDEPENDENT_AMBULATORY_CARE_PROVIDER_SITE_OTHER)
Admission: RE | Admit: 2015-02-24 | Discharge: 2015-02-24 | Disposition: A | Discharge status: Home / Self Care | Payer: BLUE CROSS/BLUE SHIELD | Source: Ambulatory Visit | Attending: Pulmonary Disease | Admitting: Pulmonary Disease

## 2015-02-24 DIAGNOSIS — J849 Interstitial pulmonary disease, unspecified: Secondary | ICD-10-CM

## 2015-03-01 ENCOUNTER — Telehealth: Payer: Self-pay | Admitting: Internal Medicine

## 2015-03-01 NOTE — Telephone Encounter (Signed)
Called and spoke to pt. Informed pt of the results and recs per RA. Pt verbalized understanding. Pt stated she wants an appt with RA not TP. Soonest appt with RA is on 5/24. Appt made with RA on 5/24 at 3:30pm. Pt verbalized understanding and denied any further questions or concerns at this time.    Notes Recorded by Rigoberto Noel, MD on 02/24/2015 at 1:41 PM Stable infiltrates -needs routine office visit to discuss with me Or TP

## 2015-03-01 NOTE — Progress Notes (Signed)
Quick Note:  Called and spoke to pt. Informed pt of the results and recs per RA. Pt verbalized understanding. Pt stated she wants an appt with RA not TP. Soonest appt with RA is on 5/24. Appt made with RA on 5/24 at 3:30pm. Pt verbalized understanding and denied any further questions or concerns at this time. ______

## 2015-03-09 ENCOUNTER — Other Ambulatory Visit: Payer: Self-pay | Admitting: Internal Medicine

## 2015-03-11 ENCOUNTER — Ambulatory Visit (INDEPENDENT_AMBULATORY_CARE_PROVIDER_SITE_OTHER)
Admission: RE | Admit: 2015-03-11 | Discharge: 2015-03-11 | Disposition: A | Discharge status: Home / Self Care | Payer: BLUE CROSS/BLUE SHIELD | Source: Ambulatory Visit | Attending: Internal Medicine | Admitting: Internal Medicine

## 2015-03-11 ENCOUNTER — Encounter: Payer: Self-pay | Admitting: Internal Medicine

## 2015-03-11 ENCOUNTER — Other Ambulatory Visit: Payer: Self-pay | Admitting: Internal Medicine

## 2015-03-11 ENCOUNTER — Ambulatory Visit (INDEPENDENT_AMBULATORY_CARE_PROVIDER_SITE_OTHER): Payer: BLUE CROSS/BLUE SHIELD | Admitting: Internal Medicine

## 2015-03-11 VITALS — BP 100/70 | HR 84 | Temp 97.8°F | Ht 66.0 in | Wt 145.5 lb

## 2015-03-11 DIAGNOSIS — S32009S Unspecified fracture of unspecified lumbar vertebra, sequela: Secondary | ICD-10-CM

## 2015-03-11 DIAGNOSIS — M5416 Radiculopathy, lumbar region: Secondary | ICD-10-CM

## 2015-03-11 DIAGNOSIS — M545 Low back pain, unspecified: Secondary | ICD-10-CM

## 2015-03-11 DIAGNOSIS — M5137 Other intervertebral disc degeneration, lumbosacral region: Secondary | ICD-10-CM

## 2015-03-11 DIAGNOSIS — G8929 Other chronic pain: Secondary | ICD-10-CM

## 2015-03-11 MED ORDER — GABAPENTIN 100 MG PO CAPS: ORAL_CAPSULE | ORAL | Status: DC

## 2015-03-11 NOTE — Progress Notes (Signed)
   Subjective:    Patient ID: Sarah Escobar, female    DOB: [birth year], 59 y.o.   MRN: [id number]  HPI  She has had back pain since October 2013. It may have begun after she had paroxysmal coughing. The onset was also while she was taking  Flagyl; she questioned a possible relationship to the medication.  The pain is in the right lumbosacral area with minimal radiation. There is no associated sciatica.  She  had surgery in April 2014 by Dr. Arnoldo Morale for herniated disc. There was some improvement. He also  prescribed a narcotic Norco. Subsequent to that she took physical therapy and had epidural steroids by Dr Ron Agee.   Her surgeon has released her and she has not seen the back specialist for an extended period time.  She now describes pain as dull upon arising. It is worse during the day with twisting of the thorax or waist flexion. It can occur when she rotates in bed & the pain can be excruciating. The pain is causing some fatigue.   No past history of malignancy. She does have advanced obstructive lung disease with chronic steroid use; she is followed by pulmonology.   Review of Systems  Fever, chills, sweats, or unexplained weight loss not present. No significant headaches. Mental status change or memory loss denied. Blurred vision , diplopia or vision loss absent. Vertigo, near syncope or imbalance denied. There is no numbness, tingling, or weakness in extremities.   No loss of control of bladder or bowels. Radicular type pain absent. No seizure stigmata.      Objective:   Physical Exam  Pertinent or positive findings include: She has an upper plate and lower partial.  There is decreased range of motion of the cervical spine.  She has a rare isolated wheeze with deep inspiration.  There is minor knee crepitus. Gait is  deliberate  The patient is able to complete heel and toe walking.  The patient has no difficulty lying back on exam table and sitting up. There is full  range of motion of  lower extremities.  Strength and tone are normal.  There is no foot drop to opposition. The knee deep tendon reflexes are equal & 1 +  Straight leg raising is negative to  90 .    General appearance :adequately nourished; in no distress. Eyes: No conjunctival inflammation or scleral icterus is present. Oral exam:  Lips and gums are healthy appearing.There is no oropharyngeal erythema or exudate noted.  Heart:  Normal rate and regular rhythm. S1 and S2 normal without gallop, murmur, click, rub or other extra sounds   Lungs:Chest clear to auscultation; no wheezes, rhonchi,rales ,or rubs present.No increased work of breathing.  Abdomen: bowel sounds normal, soft and non-tender without masses, organomegaly or hernias noted.  No guarding or rebound. No flank tenderness to percussion. Vascular : all pulses equal ; no bruits present. Skin:Warm & dry.  Intact without suspicious lesions or rashes ; no tenting or jaundice  Lymphatic: No lymphadenopathy is noted about the head, neck, axilla         Assessment & Plan:  #1 chronic lumbar pain; status post surgery for herniated disc; epidural steroid; and physical therapy.  Plan: Repeat films   trial of gabapentin at bedtime with day time doses if not too sedating.  Back exercises discussed.

## 2015-03-11 NOTE — Progress Notes (Signed)
Left message about dr hoppers findings and also mailed results to pt

## 2015-03-11 NOTE — Patient Instructions (Signed)
Assess response to the gabapentin one every 8 hours as needed. If it is partially beneficial, it can be increased up to a total of 3 pills every 8 hours as needed. This increase of 1 pill each dose  should take place over 72 hours at least.If 300 mg is effective dose ; there is a 300 mg pill. The best exercises for the low back include freestyle swimming, stretch aerobics, and yoga.Cybex & Nautilus machines rather than dead weights are better for the back.  Your next office appointment will be determined based upon review of your pending  xrays  Those instructions will be transmitted to you by mail for your records.  Critical results will be called.   Followup as needed for any active or acute issue. Please report any significant change in your symptoms.

## 2015-03-11 NOTE — Progress Notes (Signed)
Pre visit review using our clinic review tool, if applicable. No additional management support is needed unless otherwise documented below in the visit note. 

## 2015-03-11 NOTE — Assessment & Plan Note (Signed)
03/11/15 pain is now chronic in the right lumbosacral area Plan: imaging Gabapentin trial Consider MRI if symptoms progressive

## 2015-03-15 ENCOUNTER — Telehealth: Payer: Self-pay | Admitting: Internal Medicine

## 2015-03-15 NOTE — Telephone Encounter (Signed)
Left message on pt voice mail to contact back specialist and that bone density test has been suggested and ordered, advised pt to call back if any further questions

## 2015-03-15 NOTE — Telephone Encounter (Signed)
If she cannot take the gabapentin; we need to have her reevaluated by Dr. Ron Agee, the nonoperative back specialist. A bone density is also needed; this has been ordered. This should include a lateral view to rule out any old vertebral fracture.

## 2015-03-15 NOTE — Telephone Encounter (Signed)
Patient is calling about confirm about surgery on lower back and it was left side that was bothering her. Now its lower right to center right with some tingling upper left leg. She did not like the medication gabapentin (NEURONTIN) 100 MG capsule [[id number]. She could not function on it and if there is something else you can prescribe. Please call patient.

## 2015-03-16 ENCOUNTER — Ambulatory Visit (INDEPENDENT_AMBULATORY_CARE_PROVIDER_SITE_OTHER): Payer: BLUE CROSS/BLUE SHIELD | Admitting: Adult Health

## 2015-03-16 ENCOUNTER — Encounter: Payer: Self-pay | Admitting: Adult Health

## 2015-03-16 ENCOUNTER — Other Ambulatory Visit: Payer: Self-pay

## 2015-03-16 VITALS — BP 124/82 | HR 70 | Temp 97.9°F | Ht 66.0 in | Wt 146.2 lb

## 2015-03-16 DIAGNOSIS — J479 Bronchiectasis, uncomplicated: Secondary | ICD-10-CM

## 2015-03-16 MED ORDER — CLONAZEPAM 0.5 MG PO TABS: 0.5000 mg | ORAL_TABLET | Freq: Every evening | ORAL | Status: DC | PRN

## 2015-03-16 MED ORDER — FLUTICASONE FUROATE-VILANTEROL 100-25 MCG/INH IN AEPB: INHALATION_SPRAY | RESPIRATORY_TRACT | Status: DC

## 2015-03-16 NOTE — Telephone Encounter (Signed)
OK X1 

## 2015-03-16 NOTE — Assessment & Plan Note (Signed)
Stable bronchiectasis CT chest showed no progressive changes. She does tend to have frequent upper respiratory infections, would restart [facility]  Plan  May restart Breo 1 puff daily, and after use Use Mucinex DM twice daily as needed for cough and congestion Follow-up with Dr. Elsworth Soho in 6 months and as needed

## 2015-03-16 NOTE — Telephone Encounter (Signed)
Pt called back and has some question about test results   Best number [phone] Ext [phone]  Work number

## 2015-03-16 NOTE — Telephone Encounter (Signed)
Phone call to patient. She wonders why she isn't just being prescribed another medication. I advised her the best thing would be to come in a talk with Dr Linna Darner about her options. I scheduled patient an appointment this Friday April 15 at 330

## 2015-03-16 NOTE — Progress Notes (Signed)
   Subjective:    Patient ID: Sarah Escobar, female    DOB: [birth year], 59 y.o.   MRN: [id number]  HPI  PCP - Linna Darner   59 year old ex-smoker with asthmatic bronchitis & bronchiectasis.  She works in Geologist, engineering at [employer].  She smoked a pack per day for about 30-pack-years before quitting in 2003. She was given a diagnosis of asthma for more than 5 years and maintained on dulera and Spiriva. She developed sinusitis and bronchitis symptoms in January, requiring to urgent care visits and required 2 rounds of antibiotics and steroids.    Reports repeated sinus infections  9/22/154m FU  She reports a nonproductive cough and denies wheezing.  C/o head congestion Remains on breo  Sinus infection - 07/27/14 - augmentin + pred taper   Significant tests/ events  She did not desaturate on exertion.  PFT 04/2014  - no obstruction, preserved lung volumes, DLCO 76%  Chest x-ray on 3/5 and 02/12/14 showed bilateral interstitial infiltrates-right more than left, which were persistent. HRCT 02/2014- Nonspecific pattern of interstitial lung disease with bronchiectasis, subpleural reticulation and architectural  distortion. There are minor areas of alveolitis in the left upper and lower lobes Serology neg  hypersens panel neg  03/16/2015 Follow up : Asthmatic bronchitis and bronchiectasis former smoker Patient returns for a follow-up visit Patient underwent a follow-up CT chest on March 23 that showed subpleural bronchiectasis, interstitial and patchy groundglass basilar predominance. No progression noted/stable changes Previous left upper and left lower groundglass has resolved. Lack of progression and lack of honeycombing and areas of central parenchymal involvement argue against usual interstitial pneumonitis. Takes mucinex most days . Had stopped BREO  Has had a few URI went to urgent care over last 6 months  , got abx and steroids  She denies any chest pain, orthopnea, PND, hemoptysis,  orthopnea, PND or leg swelling.         Review of Systems neg for any significant sore throat, dysphagia, itching, sneezing, nasal congestion or excess/ purulent secretions, fever, chills, sweats, unintended wt loss, pleuritic or exertional cp, hempoptysis, orthopnea pnd or change in chronic leg swelling. Also denies presyncope, palpitations, heartburn, abdominal pain, nausea, vomiting, diarrhea or change in bowel or urinary habits, dysuria,hematuria, rash, arthralgias, visual complaints, headache, numbness weakness or ataxia.     Objective:   Physical Exam  Gen. Pleasant, well-nourished, in no distress, normal affect ENT - no lesions, no post nasal drip Neck: No JVD, no thyromegaly, no carotid bruits Lungs: no use of accessory muscles, no dullness to percussion, no wheezing  Cardiovascular: Rhythm regular, heart sounds  normal, no murmurs or gallops, no peripheral edema Abdomen: soft and non-tender, no hepatosplenomegaly, BS normal. Musculoskeletal: No deformities, no cyanosis or clubbing Neuro:  alert, non focal  CT chest 02/24/15  . Pulmonary parenchymal pattern of pulmonary fibrosis, as described above, appears grossly stable from 03/02/2014, with exception of clearing of patchy areas of ground-glass in the left upper and left lower lobes. Differential diagnosis includes mild chronic hypersensitivity pneumonitis and nonspecific interstitial pneumonitis. Lack of progression, lack of honeycombing and areas of central parenchymal involvement argue against usual interstitial pneumonitis. 2. Three-vessel coronary artery calcification.      Assessment & Plan:

## 2015-03-16 NOTE — Addendum Note (Signed)
Addended by: Parke Poisson E on: 03/16/2015 05:38 PM   Modules accepted: Orders

## 2015-03-16 NOTE — Telephone Encounter (Signed)
Seen 03/11/15 Med last phoned to pharmacy 02/11/15

## 2015-03-16 NOTE — Patient Instructions (Addendum)
May restart Breo 1 puff daily, and after use Use Mucinex DM twice daily as needed for cough and congestion Follow-up with Dr. Elsworth Soho in 6 months and as needed Discuss with Dr. Linna Darner coronary calcification seen on CT chest and if any further  Testing is indicated.

## 2015-03-16 NOTE — Telephone Encounter (Signed)
[facility] [employer] on [address]

## 2015-03-17 NOTE — Progress Notes (Signed)
Reviewed & agree with plan  

## 2015-03-19 ENCOUNTER — Ambulatory Visit (INDEPENDENT_AMBULATORY_CARE_PROVIDER_SITE_OTHER): Payer: BLUE CROSS/BLUE SHIELD | Admitting: Internal Medicine

## 2015-03-19 VITALS — BP 120/82 | HR 80 | Temp 98.1°F | Resp 16 | Ht 66.0 in | Wt 145.8 lb

## 2015-03-19 DIAGNOSIS — E782 Mixed hyperlipidemia: Secondary | ICD-10-CM

## 2015-03-19 DIAGNOSIS — I2510 Atherosclerotic heart disease of native coronary artery without angina pectoris: Secondary | ICD-10-CM

## 2015-03-19 DIAGNOSIS — M51379 Other intervertebral disc degeneration, lumbosacral region without mention of lumbar back pain or lower extremity pain: Secondary | ICD-10-CM

## 2015-03-19 DIAGNOSIS — M5137 Other intervertebral disc degeneration, lumbosacral region: Secondary | ICD-10-CM

## 2015-03-19 MED ORDER — AMITRIPTYLINE HCL 10 MG PO TABS: 10.0000 mg | ORAL_TABLET | Freq: Every day | ORAL | Status: DC

## 2015-03-19 NOTE — Progress Notes (Signed)
   Subjective:    Patient ID: Sarah Escobar, female    DOB: May 10, 1956, 59 y.o.   MRN: [id number]  HPI  She continues to have the aching discomfort in the right lumbosacral area. This does seem to be related to or aggravated by repetitive motion. Rarely she will have some numbness in the left upper medial thigh area.Her prior neurosurgery was on the left. She has no other neuromuscular symptoms.  The LS spine films revealed mild loss of height of the endplate of L3. Otherwise degenerative disks narrowing was stable compared to films in 2013.  She was intolerant to gabapentin as it caused mental status changes  She is concerned about atherosclerosis with calcification in the coronary arteries on the CT scan of the chest and in the aorta on LS films. She has no cardiovascular symptoms. Family history is positive for premature heart disease  Her LDL goal is less than 100, ideally less than 70. It was 78 on simvastatin on 11/03/14. Glucose was 65. She is a former smoker.She is not exercising. Blood pressure is well controlled.CV risk factors were discussed and diagram provided.  Review of Systems No significant numbness, tingling, weakness in the right lower extremity. She has no loss control of bladder or bowels.  Chest pain, palpitations, tachycardia, exertional dyspnea, paroxysmal nocturnal dyspnea, claudication or edema are absent.      Objective:   Physical Exam Appears healthy and well-nourished & in no acute distress.  Bilateral ptosis.  No carotid bruits are present.No neck vein distention present at 10 - 15 degrees. Thyroid normal to palpation  Heart rhythm and rate are normal with no gallop or murmur  Chest is clear with no increased work of breathing. Breath sounds are decreased.  There is no evidence of aortic aneurysm or renal artery bruits  Abdomen soft with no organomegaly or masses. No HJR  No clubbing, cyanosis or edema present.  Pedal pulses are intact   No  ischemic skin changes are present . Fingernails healthy   Alert and oriented. Strength, tone, DTRs reflexes normal.Hel & toe walking normal.Negative SLR bilaterally.         Assessment & Plan:  #1 localized right lumbosacral pain; low-dose Elavil will be initiated as a trial. Bone mineral density with LVA views requested to rule out vertebral compression fracture @ L3. If symptoms are controlled ,possibly Wellbutrin could be weaned & D/Ced.She is not to take Elavil with the Clonazepam.  #2 her coronary artery disease risk are adequately controlled at this time except for the lack of exercise. The exercises which would be best for the low back were discussed with her.CVE 3 X/ week should optimize LDL

## 2015-03-19 NOTE — Progress Notes (Signed)
Pre visit review using our clinic review tool, if applicable. No additional management support is needed unless otherwise documented below in the visit note. 

## 2015-03-19 NOTE — Patient Instructions (Addendum)
The best exercises for the low back include freestyle stationary bike, swimming, stretch aerobics, and yoga.  The BMD WITH LVA view referral will be scheduled and you'll be notified of the time.Please call the Referral Co-Ordinator @ [phone] if you have not been notified of appointment time within 7-10 days.

## 2015-03-19 NOTE — Assessment & Plan Note (Signed)
BMD with LVA images

## 2015-03-20 ENCOUNTER — Encounter: Payer: Self-pay | Admitting: Internal Medicine

## 2015-03-26 ENCOUNTER — Encounter: Payer: Self-pay | Admitting: Internal Medicine

## 2015-03-26 ENCOUNTER — Ambulatory Visit (INDEPENDENT_AMBULATORY_CARE_PROVIDER_SITE_OTHER): Payer: BLUE CROSS/BLUE SHIELD | Admitting: Internal Medicine

## 2015-03-26 VITALS — BP 102/62 | HR 80 | Temp 98.1°F | Resp 12 | Wt 145.6 lb

## 2015-03-26 DIAGNOSIS — L659 Nonscarring hair loss, unspecified: Secondary | ICD-10-CM

## 2015-03-26 DIAGNOSIS — R7989 Other specified abnormal findings of blood chemistry: Secondary | ICD-10-CM

## 2015-03-26 NOTE — Patient Instructions (Signed)
Please stop the Biotin and Hair skin and nails vitamins 1 day before having thyroid labs drawn.  Please continue to follow with Dr Linna Darner with annual thyroid tests.  Return to clinic as needed.

## 2015-03-26 NOTE — Progress Notes (Signed)
Patient ID: Sarah Escobar, female   DOB: [birth year], 59 y.o.   MRN: [id number]   HPI  Sarah Escobar is a 59 y.o.-year-old female, returning for f/u for abnormal thyroid tests (normal TSH, increased fT4). Last visit 6 mo ago.  Reviewed and addended hx: She c/o fatigue and hair loss at last visit with PCP >> TFTs were checked >> fT4 elevated.   A repeat set of labs were normal at last visit, in 09/2014.  I reviewed pt's thyroid tests: Lab Results  Component Value Date   TSH 2.16 10/16/2014   TSH 2.02 09/08/2014   TSH 1.47 10/29/2013   TSH 1.71 10/28/2012   TSH 2.53 05/23/2011   TSH 2.05 08/31/2009   FREET4 0.75 10/16/2014   FREET4 2.48* 09/08/2014    Pt c/o: - + fatigue >> started in 02/2014 when she had a URI + severe bronchitis >> saw Dr Elsworth Soho >> dx with bronchiectasis  - + hair loss >> improved with the Hair skin nails vitamins + Biotin - + insomnia - both initial and terminal - no tremors - + weight gain - no excessive sweating/heat intolerance - no hyperdefecation, no constipation  She denies feeling nodules in neck, + occasional hoarseness, no dysphagia/odynophagia, SOB with lying down.  No seaweed or kelp, no recent contrast studies. No steroid use recently. No herbal supplements. She is on Hair skin and nails vitamins and Biotin!  ROS: Constitutional: see HPI Eyes: no blurry vision, no xerophthalmia ENT: no sore throat, no nodules palpated in throat, no dysphagia/odynophagia, + hoarseness Cardiovascular: no CP/SOB/palpitations/no leg swelling Respiratory: + cough/no SOB/+ wheezing Gastrointestinal: no N/V/D/C Musculoskeletal:+ muscle/no joint aches Skin: no rashes, + hair loss Neurological: no tremors/numbness/tingling/dizziness, no HA  I reviewed pt's medications, allergies, PMH, social hx, family hx, and changes were documented in the history of present illness. Otherwise, unchanged from my initial visit note. Started clonazepam for sleep.  Past Medical History   Diagnosis Date  . Hyperlipidemia     LDL goal = < 100  . Hypertension   . Asthma     adult onset  . Depression   . Sciatica of left side     intermittent  . Allergy   . Personal history of adenomatous colonic polyps/FHx colon cancer sister and father     [name]   Past Surgical History  Procedure Laterality Date  . Total abdominal hysterectomy w/ bilateral salpingoophorectomy  2003    endometriosis   . Nasal sinus surgery  [phone]    X 3  . Cervical fusion  1999    Dr Arnoldo Morale, [place]  . Colonoscopy w/ polypectomy  multiple    adenomas; [name]; last 2013  . Discetomy lumbar  02/19/13    L1&2; Dr Arnoldo Morale, [place]   History   Social History  . Marital Status: Married    Spouse Name: N/A    Number of Children: 1   Occupational History  . Purchasing agent [employer]   Social History Main Topics  . Smoking status: Former Smoker -- 1.00 packs/day for 30 years    Types: Cigarettes    Quit date: 12/04/2001  . Smokeless tobacco: Never Used     Comment:    . Alcohol Use: Yes     Comment: occasionally  . Drug Use: No   Current Outpatient Prescriptions on File Prior to Visit  Medication Sig Dispense Refill  . amitriptyline (ELAVIL) 10 MG tablet Take 1 tablet (10 mg total) by mouth at bedtime.  30 tablet 2  . Biotin 5000 MCG CAPS Take 2 capsules by mouth daily.    Marland Kitchen buPROPion (WELLBUTRIN XL) 300 MG 24 hr tablet TAKE 1 TABLET EVERY MORNING 90 tablet 0  . Calcium Carb-Cholecalciferol (CALCIUM + D3 PO) Take by mouth 2 (two) times daily.    . clonazePAM (KLONOPIN) 0.5 MG tablet Take 1 tablet (0.5 mg total) by mouth at bedtime as needed (sleep). 30 tablet 0  . Docusate Calcium (STOOL SOFTENER PO) Take 2 capsules by mouth daily.    . Est Estrogens-Methyltest (ESTRATEST H.S. PO) Take by mouth daily.    . fexofenadine (ALLEGRA) 180 MG tablet Take 180 mg by mouth daily.    . Flaxseed, Linseed, (FLAXSEED OIL) 1000 MG CAPS Take by mouth 2 (two) times daily.    .  fluticasone (FLONASE) 50 MCG/ACT nasal spray USE ONE SPRAY IN EACH NOSTRIL TWICE DAILY AS NEEDED. 16 g 4  . gabapentin (NEURONTIN) 100 MG capsule One pill every eight hours as needed; dose may be increased by one pill each dose after 72 hours if only partially effective 30 capsule 2  . HYDROcodone-acetaminophen (NORCO) 10-325 MG per tablet Take 1 tablet by mouth every 6 (six) hours as needed for pain.    Marland Kitchen ipratropium-albuterol (DUONEB) 0.5-2.5 (3) MG/3ML SOLN Inhale 3 mLs into the lungs every 6 (six) hours as needed (DX 496). 360 mL 3  . metoprolol tartrate (LOPRESSOR) 25 MG tablet TAKE ONE-HALF (1/2) TABLET TWICE A DAY 90 tablet 3  . Multiple Vitamins-Minerals (ONE-A-DAY 50 PLUS PO) Take by mouth daily.    . Nutritional Supplements (ENSURE COMPLETE SHAKE) LIQD Please use one shake every other day. 16 Bottle 2  . simvastatin (ZOCOR) 20 MG tablet TAKE 1 TABLET EVERY EVENING 90 tablet 2   No current facility-administered medications on file prior to visit.   Allergies  Allergen Reactions  . Flagyl [Metronidazole] Other (See Comments)    Nerve pain  . Levofloxacin     rash  . Loperamide Hcl     rash  . Neomycin-Bacitracin Zn-Polymyx     rash  . Ofloxacin     rash  . Oxycodone Hcl     rash  . Sulfonamide Derivatives     hives  . Band-Aid Liquid Bandage [New Skin]     Regular band-aid cause itching and redness  . Gabapentin     Mental status change   Family History  Problem Relation Age of Onset  . Colon cancer Father   . Heart attack Father 39  . Diabetes Father   . Stroke Father 52  . Colon cancer Sister   . Asthma Sister   . Asthma Mother   . Colitis Mother   . Cirrhosis Mother     non alcoholic, fatty liver  . Asthma Maternal Grandmother   . Asthma Maternal Aunt   . Heart attack Sister 55  . Cirrhosis Brother     non alcoholic  . Kidney failure      2 bro, 1 sister ? from HTN  . Anemia Brother      Spherocytosis, hereditrary   PE: BP 102/62 mmHg  Pulse 80   Temp(Src) 98.1 F (36.7 C) (Oral)  Resp 12  Wt 145 lb 9.6 oz (66.044 kg)  SpO2 96% Wt Readings from Last 3 Encounters:  03/26/15 145 lb 9.6 oz (66.044 kg)  03/19/15 145 lb 12 oz (66.112 kg)  03/16/15 146 lb 3.2 oz (66.316 kg)   Constitutional: normal weight, in NAD  Eyes: PERRLA, EOMI, no exophthalmos, no lid lag, no stare ENT: moist mucous membranes, no thyromegaly, no cervical lymphadenopathy Cardiovascular: RRR, No MRG Respiratory: CTA B Gastrointestinal: abdomen soft, NT, ND, BS+ Musculoskeletal: no deformities, strength intact in all 4 Skin: moist, warm, no rashes Neurological: no tremor with outstretched hands, DTR normal in all 4  ASSESSMENT: 1. High fT4, normal TSH  2. Hair loss - improved  PLAN:  1. Patient with h/o a high fT4 in the context of a normal TSH and a myriad of sxs which can be associated to HTyr. At last check, this has normalized. I believe her fT4 was abnormal b/c of her Biotin and hair skin and nails vitamins >> I advised her to stop these before having labs drawn for her thyroid.  - Will check the TSH, fT3 and fT4 next week  - I recommended that she continues to follow with PCP with annual TFTs and RTC as needed if these are abnormal  - continue Lopressor for now (12.5 mg 2x daily) - I advised her to join my chart to communicate easier, but she declined Return if symptoms worsen or fail to improve.  2. Hair loss - likely 2/2 initial thyroid hh increase, now improving - last B12 level normal - continue Hair-skin-nails vitamins + biotin  Component     Latest Ref Rng 03/29/2015  TSH     0.35 - 4.50 uIU/mL 1.80  Free T4     0.60 - 1.60 ng/dL 0.50 (L)  T3, Free     2.3 - 4.2 pg/mL 3.2   TSH and free T3 normal. Free T4 little low. A previous high free T4 with now a slightly low we T4 can be consistent with a resolving episode of mild thyroiditis. Just to ensure stability, I would recommend to have another thyroid test panel checked in 2 months. I  will order these.

## 2015-03-29 ENCOUNTER — Other Ambulatory Visit (INDEPENDENT_AMBULATORY_CARE_PROVIDER_SITE_OTHER): Payer: BLUE CROSS/BLUE SHIELD

## 2015-03-29 DIAGNOSIS — R7989 Other specified abnormal findings of blood chemistry: Secondary | ICD-10-CM | POA: Diagnosis not present

## 2015-03-29 LAB — T3, FREE: T3, Free: 3.2 pg/mL (ref 2.3–4.2)

## 2015-03-29 LAB — TSH: TSH: 1.8 u[IU]/mL (ref 0.35–4.50)

## 2015-03-29 LAB — T4, FREE: FREE T4: 0.5 ng/dL — AB (ref 0.60–1.60)

## 2015-03-30 ENCOUNTER — Ambulatory Visit (INDEPENDENT_AMBULATORY_CARE_PROVIDER_SITE_OTHER)
Admission: RE | Admit: 2015-03-30 | Discharge: 2015-03-30 | Disposition: A | Discharge status: Home / Self Care | Payer: BLUE CROSS/BLUE SHIELD | Source: Ambulatory Visit | Attending: Internal Medicine | Admitting: Internal Medicine

## 2015-03-30 ENCOUNTER — Other Ambulatory Visit: Payer: Self-pay | Admitting: Internal Medicine

## 2015-03-30 ENCOUNTER — Ambulatory Visit
Admission: RE | Admit: 2015-03-30 | Discharge: 2015-03-30 | Disposition: A | Discharge status: Home / Self Care | Payer: BLUE CROSS/BLUE SHIELD | Source: Ambulatory Visit | Attending: Internal Medicine | Admitting: Internal Medicine

## 2015-03-30 DIAGNOSIS — S32009S Unspecified fracture of unspecified lumbar vertebra, sequela: Secondary | ICD-10-CM

## 2015-04-01 ENCOUNTER — Encounter: Payer: Self-pay | Admitting: *Deleted

## 2015-04-01 ENCOUNTER — Telehealth: Payer: Self-pay | Admitting: Internal Medicine

## 2015-04-01 NOTE — Telephone Encounter (Signed)
Called pt and advised her per Dr Gherghe's result note. Pt voiced understanding.  

## 2015-04-01 NOTE — Telephone Encounter (Signed)
Patient called and would like to know if her labs are available    Please advise patient    Thank you

## 2015-04-07 ENCOUNTER — Telehealth: Payer: Self-pay | Admitting: Internal Medicine

## 2015-04-07 NOTE — Telephone Encounter (Signed)
That's fine Note:usual dose is 25 -50 mg prn

## 2015-04-07 NOTE — Telephone Encounter (Signed)
Please call patient. She has some questions about amitriptyline (ELAVIL) 10 MG tablet [[id number]

## 2015-04-07 NOTE — Telephone Encounter (Signed)
Left patient a message via voicemail that it is okay to cut tablet in half and to out office back if there are any questions

## 2015-04-07 NOTE — Telephone Encounter (Signed)
Phone call to patient. She states the Amitriptyline 10 mg tablet is not agreeing with her. She can not sleep. The next day she is very tired and it takes hours to compose herself. Can she cut it in half? Please advise.

## 2015-04-12 ENCOUNTER — Other Ambulatory Visit: Payer: Self-pay

## 2015-04-12 MED ORDER — CLONAZEPAM 0.5 MG PO TABS: 0.5000 mg | ORAL_TABLET | Freq: Every evening | ORAL | Status: DC | PRN

## 2015-04-12 MED ORDER — BUPROPION HCL ER (XL) 300 MG PO TB24: ORAL_TABLET | ORAL | Status: DC

## 2015-04-12 NOTE — Telephone Encounter (Signed)
[facility]

## 2015-04-12 NOTE — Addendum Note (Signed)
Addended by: Roma Schanz R on: 04/12/2015 01:23 PM   Modules accepted: Orders

## 2015-04-12 NOTE — Telephone Encounter (Signed)
Wellbutrin Xl to be sent to [facility] [employer] on [address]

## 2015-04-14 ENCOUNTER — Other Ambulatory Visit: Payer: Self-pay | Admitting: Internal Medicine

## 2015-04-14 ENCOUNTER — Telehealth: Payer: Self-pay | Admitting: Internal Medicine

## 2015-04-14 DIAGNOSIS — M5137 Other intervertebral disc degeneration, lumbosacral region: Secondary | ICD-10-CM

## 2015-04-14 NOTE — Telephone Encounter (Signed)
Patient has been advised and would like a referral to pain management

## 2015-04-14 NOTE — Telephone Encounter (Signed)
If she needs narcotic pain medication she need to be followed by a back specialist or I can refer her to a chronic pain clinic. I do not practice chronic pain management

## 2015-04-14 NOTE — Telephone Encounter (Signed)
Referral made 

## 2015-04-14 NOTE — Telephone Encounter (Signed)
Patient was prescribed amitriptyline (ELAVIL) 10 MG tablet [[id number] and was taking as whole then tried 1/2 pill but it was too much so she would like to only try 1/4 pill just on weekends till she gets used to it. She is also requesting a prescription for HYDROcodone-acetaminophen ([place]) 10-325 MG per tablet [[id number]] just till she can get used to other medication.

## 2015-04-15 ENCOUNTER — Telehealth: Payer: Self-pay | Admitting: Pulmonary Disease

## 2015-04-15 MED ORDER — PREDNISONE 10 MG PO TABS: ORAL_TABLET | ORAL | Status: DC

## 2015-04-15 MED ORDER — AZITHROMYCIN 250 MG PO TABS: 250.0000 mg | ORAL_TABLET | ORAL | Status: DC

## 2015-04-15 NOTE — Telephone Encounter (Signed)
May leave detailed message on voicemail [phone] ext [phone]

## 2015-04-15 NOTE — Telephone Encounter (Signed)
lmtcb for pt.  

## 2015-04-15 NOTE — Telephone Encounter (Signed)
Called and spoke to pt. Pt c/o prod cough with yellow mucus and pain between her shoulder blades when she coughs x 2 days. Pt is requesting a pred taper. Pt denies an increase in SOB, f/c/s, CP/tightness.   RA please advise.   Allergies  Allergen Reactions  . Flagyl [Metronidazole] Other (See Comments)    Nerve pain  . Levofloxacin     rash  . Loperamide Hcl     rash  . Neomycin-Bacitracin Zn-Polymyx     rash  . Ofloxacin     rash  . Oxycodone Hcl     rash  . Sulfonamide Derivatives     hives  . Band-Aid Liquid Bandage [New Skin]     Regular band-aid cause itching and redness  . Gabapentin     Mental status change

## 2015-04-15 NOTE — Telephone Encounter (Signed)
zpak Prednisone 10 mg tabs  Take 2 tabs daily with food x 5ds, then 1 tab daily with food x 5ds then STOP  

## 2015-04-15 NOTE — Telephone Encounter (Signed)
Spoke with the pt and notified of recs per RA  She verbalized understanding  Rxs were sent to pharm

## 2015-04-27 ENCOUNTER — Ambulatory Visit: Payer: BLUE CROSS/BLUE SHIELD | Admitting: Pulmonary Disease

## 2015-05-04 ENCOUNTER — Telehealth: Payer: Self-pay | Admitting: Pulmonary Disease

## 2015-05-04 ENCOUNTER — Other Ambulatory Visit: Payer: Self-pay | Admitting: Pulmonary Disease

## 2015-05-04 DIAGNOSIS — J479 Bronchiectasis, uncomplicated: Secondary | ICD-10-CM

## 2015-05-04 MED ORDER — DOXYCYCLINE HYCLATE 100 MG PO TABS: 100.0000 mg | ORAL_TABLET | Freq: Every day | ORAL | Status: DC

## 2015-05-04 NOTE — Telephone Encounter (Signed)
She has bronchiectasis - needs longer course of abx Doxy 100 daily x 7 days - take probiotic or yogurt with this Obtain CXR

## 2015-05-04 NOTE — Telephone Encounter (Signed)
Patient says she felt a little better on Prednisone and Zpak.  However, now she feels like she is getting the same symptoms again, coughing up yellow phlegm again, no fever.  Patient wants to know if she should try another round of Zpak and Prednisone to knock it out completely.  Allergies  Allergen Reactions  . Flagyl [Metronidazole] Other (See Comments)    Nerve pain  . Levofloxacin     rash  . Loperamide Hcl     rash  . Neomycin-Bacitracin Zn-Polymyx     rash  . Ofloxacin     rash  . Oxycodone Hcl     rash  . Sulfonamide Derivatives     hives  . Band-Aid Liquid Bandage [New Skin]     Regular band-aid cause itching and redness  . Gabapentin     Mental status change

## 2015-05-04 NOTE — Telephone Encounter (Signed)
Patient notified to take only 1 tablet daily for 7 days.  Patient has car in shop and will have CXR done when her car gets out of the shop. Nothing further needed.

## 2015-05-04 NOTE — Telephone Encounter (Signed)
Patient notified.  CXR ordered, patient will come by and have done as soon as she can.  Rx sent to pharmacy. Nothing further needed.

## 2015-05-10 ENCOUNTER — Ambulatory Visit (INDEPENDENT_AMBULATORY_CARE_PROVIDER_SITE_OTHER)
Admission: RE | Admit: 2015-05-10 | Discharge: 2015-05-10 | Disposition: A | Discharge status: Home / Self Care | Payer: BLUE CROSS/BLUE SHIELD | Source: Ambulatory Visit | Attending: Pulmonary Disease | Admitting: Pulmonary Disease

## 2015-05-10 DIAGNOSIS — J479 Bronchiectasis, uncomplicated: Secondary | ICD-10-CM | POA: Diagnosis not present

## 2015-05-11 ENCOUNTER — Telehealth: Payer: Self-pay | Admitting: Pulmonary Disease

## 2015-05-11 NOTE — Progress Notes (Signed)
Quick Note:  Called and spoke to pt. Informed her of the results and recs per RA. Appt made with RA on 07/07/2015. Pt verbalized understanding and denied any further questions or concerns at this time. ______

## 2015-05-11 NOTE — Telephone Encounter (Signed)
Called and spoke to pt. Informed her of the results and recs per RA. Appt made with RA on 07/07/2015. Pt verbalized understanding and denied any further questions or concerns at this time.    Notes Recorded by Rigoberto Noel, MD on 05/10/2015 at 2:25 PM Mild scarring as prior No pneumonia Pl ensure FU with me over next 1-2 months

## 2015-05-12 ENCOUNTER — Telehealth: Payer: Self-pay | Admitting: Internal Medicine

## 2015-05-12 ENCOUNTER — Other Ambulatory Visit: Payer: Self-pay

## 2015-05-12 MED ORDER — ENSURE ENLIVE PO LIQD: 237.0000 mL | Freq: Two times a day (BID) | ORAL | Status: AC

## 2015-05-12 MED ORDER — ENSURE COMPLETE SHAKE PO LIQD: ORAL | Status: DC

## 2015-05-12 NOTE — Telephone Encounter (Signed)
Patient states she received fax but it was for ensure complete and patient is requesting ensure enlive b/c ensure complete she states is being discontinued.

## 2015-05-12 NOTE — Telephone Encounter (Signed)
Needs script for ensure enlive.  Patient would like script emailed to her if possible so she can get through [facility].

## 2015-05-12 NOTE — Telephone Encounter (Signed)
rx for ensure faxed

## 2015-05-12 NOTE — Telephone Encounter (Signed)
Ensure rx sent to fax [phone] per patient request

## 2015-05-14 ENCOUNTER — Other Ambulatory Visit (INDEPENDENT_AMBULATORY_CARE_PROVIDER_SITE_OTHER): Payer: BLUE CROSS/BLUE SHIELD

## 2015-05-14 ENCOUNTER — Ambulatory Visit: Payer: BLUE CROSS/BLUE SHIELD | Admitting: Internal Medicine

## 2015-05-14 ENCOUNTER — Other Ambulatory Visit: Payer: Self-pay | Admitting: Internal Medicine

## 2015-05-14 DIAGNOSIS — R7989 Other specified abnormal findings of blood chemistry: Secondary | ICD-10-CM | POA: Diagnosis not present

## 2015-05-14 DIAGNOSIS — L659 Nonscarring hair loss, unspecified: Secondary | ICD-10-CM

## 2015-05-14 LAB — T3, FREE: T3, Free: 2.6 pg/mL (ref 2.3–4.2)

## 2015-05-14 LAB — TSH: TSH: 1.56 u[IU]/mL (ref 0.35–4.50)

## 2015-05-14 LAB — T4, FREE: Free T4: 0.62 ng/dL (ref 0.60–1.60)

## 2015-05-18 ENCOUNTER — Other Ambulatory Visit: Payer: Self-pay | Admitting: Emergency Medicine

## 2015-05-18 ENCOUNTER — Telehealth: Payer: Self-pay | Admitting: Internal Medicine

## 2015-05-18 MED ORDER — CLONAZEPAM 0.5 MG PO TABS: 0.5000 mg | ORAL_TABLET | Freq: Every evening | ORAL | Status: DC | PRN

## 2015-05-18 NOTE — Telephone Encounter (Signed)
Pt called in and said that she needed a refill on her clonazePAM (KLONOPIN) 0.5 MG tablet [[id number]]   [facility]  [phone]

## 2015-05-21 ENCOUNTER — Other Ambulatory Visit: Payer: BLUE CROSS/BLUE SHIELD

## 2015-05-26 ENCOUNTER — Other Ambulatory Visit: Payer: Self-pay | Admitting: Emergency Medicine

## 2015-05-26 ENCOUNTER — Ambulatory Visit: Payer: BLUE CROSS/BLUE SHIELD | Admitting: Internal Medicine

## 2015-05-26 MED ORDER — ALBUTEROL SULFATE (2.5 MG/3ML) 0.083% IN NEBU: INHALATION_SOLUTION | RESPIRATORY_TRACT | Status: DC

## 2015-06-01 ENCOUNTER — Encounter: Payer: Self-pay | Admitting: Internal Medicine

## 2015-06-01 ENCOUNTER — Ambulatory Visit (INDEPENDENT_AMBULATORY_CARE_PROVIDER_SITE_OTHER): Payer: BLUE CROSS/BLUE SHIELD | Admitting: Internal Medicine

## 2015-06-01 VITALS — BP 124/80 | HR 71 | Temp 98.0°F | Resp 18 | Wt 144.0 lb

## 2015-06-01 DIAGNOSIS — L659 Nonscarring hair loss, unspecified: Secondary | ICD-10-CM | POA: Diagnosis not present

## 2015-06-01 DIAGNOSIS — M1990 Unspecified osteoarthritis, unspecified site: Secondary | ICD-10-CM | POA: Diagnosis not present

## 2015-06-01 DIAGNOSIS — G479 Sleep disorder, unspecified: Secondary | ICD-10-CM | POA: Diagnosis not present

## 2015-06-01 DIAGNOSIS — J329 Chronic sinusitis, unspecified: Secondary | ICD-10-CM | POA: Diagnosis not present

## 2015-06-01 NOTE — Progress Notes (Signed)
   Subjective:    Patient ID: Sarah Escobar, female    DOB: Apr 17, 1956, 59 y.o.   MRN: [id number]  HPI Her Endocrinologist has recommended consultation with a Dermatologist to assess her persistent hair loss. This has been scheduled 06/24/15. The Endocrinologist has found no thyroid dysfunction to explain this.  Review of Systems  She also has had sleep dysfunction with difficulty going to sleep and staying asleep. Clonazepam does appear to be working well. She occasionally will still awaken worrying about issues. She admits to some chronic anxiety.  Her constipation is better off the Norco.She has been using ibuprofen for the back pain.  She has started biking but noticed some pain in the left popliteal space.  She describes recurrent sinus infections. She recently completed a course of prednisone for treatment of her bronchiectasis. With the sinus infection she's had frontal headaches.    Objective:   Physical Exam  Pertinent or positive findings include: Scalp reveals no significant rashes or obvious hair loss. She has an upper plate and lower partial.  General appearance :adequately nourished; in no distress.  Eyes: No conjunctival inflammation or scleral icterus is present.  Oral exam:  Lips and gums are healthy appearing.There is no oropharyngeal erythema or exudate noted.   Heart:  Normal rate and regular rhythm. S1 and S2 normal without gallop, murmur, click, rub or other extra sounds    Lungs:Chest clear to auscultation; no wheezes, rhonchi,rales ,or rubs present.No increased work of breathing.   Vascular : all pulses equal ; no bruits present.  Skin:Warm & dry.  Intact without suspicious lesions or rashes ; no tenting or jaundice   Lymphatic: No lymphadenopathy is noted about the head, neck, axilla  Neuro: Strength, tone & DTRs normal.         Assessment & Plan:  #1 hair loss, probably cyclical. A trial of T-Gel shampoo will be recommended pending the  Dermatology consultation.  #2 sleep dysfunction. Sleep hygiene discussed. Clonazepam will be renewed.  #3 Degenerative joint disease. Resuming glucosamine sulfate was discussed.  #4 chronic rhinitis/sinusitis; nasal hygiene discussed  See after visit summary

## 2015-06-01 NOTE — Progress Notes (Signed)
Pre visit review using our clinic review tool, if applicable. No additional management support is needed unless otherwise documented below in the visit note. 

## 2015-06-01 NOTE — Patient Instructions (Signed)
Consider glucosamine sulfate 1500 mg daily for joint symptoms. Take this daily  for 3 months and then leave it off for 2 months. This will rehydrate the cartilages.  To prevent sleep dysfunction follow these instructions for sleep hygiene. Do not read, watch TV, or eat in bed. Do not get into bed until you are ready to turn off the light &  to go to sleep. Do not ingest stimulants ( decongestants, diet pills, nicotine, caffeine) after the evening meal.Do not take daytime naps.Cardiovascular exercise, this can be as simple a program as walking, is recommended 30-45 minutes 3-4 times per week. If you're not exercising you should take 6-8 weeks to build up to this level.  Plain Mucinex (NOT D) for thick secretions ;force NON dairy fluids .   Nasal cleansing in the shower as discussed with lather of mild shampoo.After 10 seconds wash off lather while  exhaling through nostrils. Make sure that all residual soap is removed to prevent irritation.  Flonase OR Nasacort AQ 1 spray in each nostril twice a day as needed. Use the "crossover" technique into opposite nostril spraying toward opposite ear @ 45 degree angle, not straight up into nostril.  Plain Allegra (NOT D )  160 daily , Loratidine 10 mg , OR Zyrtec 10 mg @ bedtime  as needed for itchy eyes & sneezing.  Use T-Gel , a coal tar shampoo, one to  2 times per week. This will have an antibacterial effect on scalp lesions.

## 2015-06-11 ENCOUNTER — Telehealth: Payer: Self-pay | Admitting: Pulmonary Disease

## 2015-06-11 MED ORDER — DOXYCYCLINE HYCLATE 100 MG PO TABS: 100.0000 mg | ORAL_TABLET | Freq: Every day | ORAL | Status: DC

## 2015-06-11 MED ORDER — PREDNISONE 10 MG PO TABS: ORAL_TABLET | ORAL | Status: DC

## 2015-06-11 NOTE — Telephone Encounter (Signed)
Per Dr. Elsworth Soho, give patient Pred 10mg  , 20x 5 days, 10x 5 days.   Rx sent to pharmacy. Left detailed message on patient's voicemail advising her that rx has been sent to her pharmacy and asked patient to call back if any questions. Nothing further needed.

## 2015-06-11 NOTE — Telephone Encounter (Signed)
Called pt and is aware of recs. rx sent in. Nothing further needed

## 2015-06-11 NOTE — Telephone Encounter (Signed)
Per Dr. Elsworth Soho, give Doxy and Mucinex.  (see TE 06/11/2015) Ok to give prednisone taper?

## 2015-06-11 NOTE — Telephone Encounter (Signed)
Doxy 100 x 7 days mucinex 600 bid

## 2015-06-11 NOTE — Telephone Encounter (Signed)
Pt c/o pain in back around shoulder blades and some discomfort in chest- worse with cough.  Coughing up green mucus x 1 week+ Pt states that she woke up with this morning with a sore throat.  Denies fever or any other symptoms.   Allergies  Allergen Reactions  . Flagyl [Metronidazole] Other (See Comments)    Nerve pain  . Levofloxacin     rash  . Loperamide Hcl     rash  . Neomycin-Bacitracin Zn-Polymyx     rash  . Ofloxacin     rash  . Oxycodone Hcl     rash  . Sulfonamide Derivatives     hives  . Band-Aid Liquid Bandage [New Skin]     Regular band-aid cause itching and redness  . Gabapentin     Mental status change   Please advise Dr Elsworth Soho. Thanks.

## 2015-06-21 ENCOUNTER — Telehealth: Payer: Self-pay | Admitting: *Deleted

## 2015-06-21 NOTE — Telephone Encounter (Signed)
Pt called stating that she requested Dr Cruzita Lederer to do a referral to a hair loss clinic in [place]. She got the message that Dr Irish Elders is out on medical leave until December. Pt stated that she was advised that she would need to see a dermatologist here and an appt was made with Dr Renda Rolls here in [place]. Pt just wanted Dr Cruzita Lederer to be aware. Pt thought that she could see any Dr there at the hair loss clinic. Pt just wanted to know if Dr Cruzita Lederer knew all this and get her advice. Please advise.

## 2015-06-21 NOTE — Telephone Encounter (Signed)
I agree that she could see any of the Dr's in the hair loss clinic. Thank you for letting me know.

## 2015-06-21 NOTE — Telephone Encounter (Signed)
Secondly, can you review the notes in the referrals and see what was documented in there? Also, should pt see Dr Renda Rolls first? Pt is confused and concerned. Thank you!

## 2015-06-21 NOTE — Telephone Encounter (Signed)
I reviewed them. Seeing the dermatologist is a good idea. Since the hair loss clinic doctor is not there until next June, there is a long time to wait, but I would still maintain the referral there, even after she sees the dermatologist, if things don't improve.

## 2015-06-22 NOTE — Telephone Encounter (Signed)
Called pt and advised her per Dr Gherghe's message. Pt voiced understanding.  

## 2015-06-28 ENCOUNTER — Telehealth: Payer: Self-pay | Admitting: Internal Medicine

## 2015-06-28 ENCOUNTER — Other Ambulatory Visit: Payer: Self-pay

## 2015-06-28 MED ORDER — LORAZEPAM 0.5 MG PO TABS: 0.5000 mg | ORAL_TABLET | Freq: Every day | ORAL | Status: DC

## 2015-06-28 NOTE — Telephone Encounter (Signed)
Called pharmacy and dc'ed refills for the clonazepam and phoned in the loraxepam 0.5 #30 2 refills.  Pt informed of new rx and MD advisement.

## 2015-06-28 NOTE — Telephone Encounter (Signed)
Patient called requesting an alternative to clonazepan. Someone had mentioned maybe atavan or anything that won't knock her out the next day. [facility] [address] Also, patient is losing her hair and a dermatologist suggested she start taking rogaine and she is wondering if it is safe with all the other medications she is taking.

## 2015-06-28 NOTE — Telephone Encounter (Signed)
Lorazepam 0.5 mg qhs prn in place of Clonazepam Topical Rogaine OK

## 2015-06-28 NOTE — Telephone Encounter (Signed)
Please advise 

## 2015-07-07 ENCOUNTER — Ambulatory Visit (INDEPENDENT_AMBULATORY_CARE_PROVIDER_SITE_OTHER): Payer: BLUE CROSS/BLUE SHIELD | Admitting: Pulmonary Disease

## 2015-07-07 ENCOUNTER — Other Ambulatory Visit: Payer: BLUE CROSS/BLUE SHIELD

## 2015-07-07 ENCOUNTER — Encounter: Payer: Self-pay | Admitting: Pulmonary Disease

## 2015-07-07 ENCOUNTER — Other Ambulatory Visit: Payer: Self-pay | Admitting: Emergency Medicine

## 2015-07-07 VITALS — BP 120/82 | HR 76 | Ht 66.0 in | Wt 145.6 lb

## 2015-07-07 DIAGNOSIS — J329 Chronic sinusitis, unspecified: Secondary | ICD-10-CM

## 2015-07-07 DIAGNOSIS — J4530 Mild persistent asthma, uncomplicated: Secondary | ICD-10-CM

## 2015-07-07 DIAGNOSIS — J479 Bronchiectasis, uncomplicated: Secondary | ICD-10-CM

## 2015-07-07 MED ORDER — ALBUTEROL SULFATE (2.5 MG/3ML) 0.083% IN NEBU: INHALATION_SOLUTION | RESPIRATORY_TRACT | Status: DC

## 2015-07-07 MED ORDER — PROMETHAZINE-CODEINE 6.25-10 MG/5ML PO SYRP: 5.0000 mL | ORAL_SOLUTION | Freq: Four times a day (QID) | ORAL | Status: DC | PRN

## 2015-07-07 NOTE — Progress Notes (Signed)
   Subjective:    Patient ID: Sarah Escobar, female    DOB: 1956/02/16, 59 y.o.   MRN: [id number]  HPI  PCP - Linna Darner   59 year old ex-smoker with asthmatic bronchitis & bronchiectasis.  She works in Geologist, engineering at [employer].  She smoked 1 PPD for about 30-pack-years before quitting in 2003. She was given a diagnosis of asthma for more than 5 years and maintained on dulera and Spiriva. She developed sinusitis and bronchitis symptoms in January, requiring to urgent care visits and required 2 rounds of antibiotics and steroids.  Reports repeated sinus infections, had multiple sinus surgeries       07/07/2015  Chief Complaint  Patient presents with  . Follow-up    breathing has been doing well until the past few days, coughing up greenish-yellow mucus, wheezing.  Patient was on Prednisone and Doxycycline the last time she got sick and it made her feel great.    2 sick calls requiring doxy & mucinex & pred taper Stopped taking Breo Takes mucinex most days . She denies any chest pain, orthopnea, PND, hemoptysis, orthopnea, PND or leg swelling.  Has multiple questions today about allergies, sinus issues etc  Significant tests/ events  PFT 04/2014 - no obstruction, preserved lung volumes, DLCO 76%  Chest x-ray on 3/5 and 02/12/14 showed bilateral interstitial infiltrates-right more than left, which were persistent. HRCT 02/2014- Nonspecific pattern of interstitial lung disease with bronchiectasis, subpleural reticulation and architectural  distortion. There are minor areas of alveolitis in the left upper and lower lobes Serology neg  hypersens panel neg  02/2015  CT chest  that showed subpleural bronchiectasis, interstitial and patchy groundglass basilar predominance. No progression noted/stable changes Previous left upper and left lower groundglass has resolved. Lack of progression and lack of honeycombing and areas of central parenchymal involvement argue against usual  interstitial pneumonitis.  Past Medical History  Diagnosis Date  . Hyperlipidemia     LDL goal = < 100  . Hypertension   . Asthma     adult onset  . Depression   . Sciatica of left side     intermittent  . Allergy   . Personal history of adenomatous colonic polyps/FHx colon cancer sister and father     [name]     Review of Systems neg for any significant sore throat, dysphagia, itching, sneezing, nasal congestion or excess/ purulent secretions, fever, chills, sweats, unintended wt loss, pleuritic or exertional cp, hempoptysis, orthopnea pnd or change in chronic leg swelling. Also denies presyncope, palpitations, heartburn, abdominal pain, nausea, vomiting, diarrhea or change in bowel or urinary habits, dysuria,hematuria, rash, arthralgias, visual complaints, headache, numbness weakness or ataxia.     Objective:   Physical Exam  Gen. Pleasant, well-nourished, in no distress, normal affect ENT - no lesions, no post nasal drip Neck: No JVD, no thyromegaly, no carotid bruits Lungs: no use of accessory muscles, no dullness to percussion, RLLrales , no rhonchi  Cardiovascular: Rhythm regular, heart sounds  normal, no murmurs or gallops, no peripheral edema Abdomen: soft and non-tender, no hepatosplenomegaly, BS normal. Musculoskeletal: No deformities, no cyanosis or clubbing Neuro:  alert, non focal       Assessment & Plan:

## 2015-07-07 NOTE — Patient Instructions (Signed)
Allergy testing - blood work Sinus scan Plan for bronchoscopy - we discussed risks & benefits of procedure Get back on Breo

## 2015-07-08 ENCOUNTER — Telehealth: Payer: Self-pay | Admitting: Pulmonary Disease

## 2015-07-08 LAB — ALLERGY FULL PROFILE
ASPERGILLUS FUMIGATUS M3: 0.11 kU/L — AB
Bermuda Grass: <0.1 kU/L
Box Elder IgE: <0.1 kU/L
Candida Albicans: <0.1 kU/L
Common Ragweed: <0.1 kU/L
Curvularia lunata: <0.1 kU/L
D. farinae: <0.1 kU/L
Dog Dander: <0.1 kU/L
Elm IgE: <0.1 kU/L
Fescue: <0.1 kU/L
G005 Rye, Perennial: <0.1 kU/L
G009 Red Top: <0.1 kU/L
Goldenrod: <0.1 kU/L
House Dust Hollister: <0.1 kU/L
IgE (Immunoglobulin E), Serum: 73 kU/L (ref <115)
Lamb's Quarters: <0.1 kU/L
Oak: <0.1 kU/L
Stemphylium Botryosum: <0.1 kU/L
Sycamore Tree: <0.1 kU/L
Timothy Grass: <0.1 kU/L

## 2015-07-08 NOTE — Assessment & Plan Note (Addendum)
Lack of progression and lack of honeycombing and areas of central parenchymal involvement argue against usual interstitial pneumonitis. Favor NSIP vs bronchiectasis due to old infection / MAC Proceed with bronchoscopy - risks & benefits discussed If above neg, will consider chronic suppression with azithro

## 2015-07-08 NOTE — Telephone Encounter (Signed)
Called and left detailed message on patient's voicemail asking her to call back to let me know which date works best for her. Awaiting call back from patient.

## 2015-07-08 NOTE — Telephone Encounter (Signed)
Can perform bronch on Thursday 8/11 or Tuesday 8/16 8am.  Once she confirms, Pl call bronchoscopy [phone] to confirm slot.

## 2015-07-08 NOTE — Assessment & Plan Note (Signed)
Allergy testing Sinus CT Get back on breo

## 2015-07-08 NOTE — Telephone Encounter (Signed)
lmtcb

## 2015-07-09 ENCOUNTER — Other Ambulatory Visit: Payer: BLUE CROSS/BLUE SHIELD

## 2015-07-09 ENCOUNTER — Telehealth: Payer: Self-pay | Admitting: Pulmonary Disease

## 2015-07-09 NOTE — Telephone Encounter (Signed)
Spoke with respiratory, they advised me that Dr. Elsworth Soho has already scheduled the Bronch for 8/11.  However, patient prefers to do the bronch on 8/16.  Per Dr. Bari Mantis message below, 8/16 was an available time for him, so appointment has been rescheduled for 8/16.  Patient has been notified. FYI to Dr. Elsworth Soho

## 2015-07-09 NOTE — Telephone Encounter (Signed)
Patient wants to do bronch on 07/20/15 @ [facility] at 8am.  Called respiratory and left message to call back to schedule. Awaiting call back from Respiratory.

## 2015-07-09 NOTE — Telephone Encounter (Signed)
Spoke with pt. She would like a copy of her results. These have been placed in the outgoing mail. Nothing further was needed.

## 2015-07-13 ENCOUNTER — Ambulatory Visit (INDEPENDENT_AMBULATORY_CARE_PROVIDER_SITE_OTHER)
Admission: RE | Admit: 2015-07-13 | Discharge: 2015-07-13 | Disposition: A | Discharge status: Home / Self Care | Payer: BLUE CROSS/BLUE SHIELD | Source: Ambulatory Visit | Attending: Pulmonary Disease | Admitting: Pulmonary Disease

## 2015-07-13 DIAGNOSIS — J329 Chronic sinusitis, unspecified: Secondary | ICD-10-CM | POA: Diagnosis not present

## 2015-07-13 NOTE — Telephone Encounter (Signed)
ok 

## 2015-07-14 ENCOUNTER — Other Ambulatory Visit: Payer: Self-pay | Admitting: Emergency Medicine

## 2015-07-14 ENCOUNTER — Telehealth: Payer: Self-pay | Admitting: Emergency Medicine

## 2015-07-14 MED ORDER — BUPROPION HCL ER (XL) 300 MG PO TB24: ORAL_TABLET | ORAL | Status: DC

## 2015-07-14 NOTE — Telephone Encounter (Signed)
Refill request for Bupropion, Last office visit 06/01/15, last refill 04/12/15 #90

## 2015-07-14 NOTE — Telephone Encounter (Signed)
OK with 2 refills

## 2015-07-15 ENCOUNTER — Encounter (HOSPITAL_COMMUNITY): Payer: BLUE CROSS/BLUE SHIELD

## 2015-07-15 ENCOUNTER — Encounter (HOSPITAL_COMMUNITY): Payer: Self-pay

## 2015-07-15 ENCOUNTER — Ambulatory Visit (HOSPITAL_COMMUNITY): Admit: 2015-07-15 | Payer: BLUE CROSS/BLUE SHIELD | Admitting: Pulmonary Disease

## 2015-07-15 SURGERY — BRONCHOSCOPY, WITH FLUOROSCOPY
Anesthesia: Moderate Sedation | Laterality: Bilateral

## 2015-07-19 ENCOUNTER — Telehealth: Payer: Self-pay | Admitting: *Deleted

## 2015-07-19 MED ORDER — BUPROPION HCL ER (XL) 300 MG PO TB24: ORAL_TABLET | ORAL | Status: DC

## 2015-07-19 NOTE — Telephone Encounter (Signed)
Left msg on triage stating mail order has been trying to get Bupropion refill. Requesting rx to be sent to them. Called pt back no answer LMOM rx has been sent to [place]...Sarah Escobar

## 2015-07-19 NOTE — Telephone Encounter (Signed)
Dr. Elsworth Soho is now aware and bronch was cancelled as well. Nothing further needed

## 2015-07-19 NOTE — Telephone Encounter (Signed)
Called spoke with pt. She reports she is not able to do bronch tomorrow AM. She reports her insurance just called her about how much she would have to pay and she can't afford this right now. She reports when she can afford this she will call back  [facility] bronch lab and no answer. I have paged Dr. Elsworth Soho as well.

## 2015-07-19 NOTE — Telephone Encounter (Signed)
Pt cb [phone] est [id number] states she is unable to keep appt for  8/16 to have bronch due to amount she would have to pay

## 2015-07-20 ENCOUNTER — Encounter (HOSPITAL_COMMUNITY): Admission: RE | Payer: Self-pay | Source: Ambulatory Visit

## 2015-07-20 ENCOUNTER — Ambulatory Visit (HOSPITAL_COMMUNITY)
Admission: RE | Admit: 2015-07-20 | Payer: BLUE CROSS/BLUE SHIELD | Source: Ambulatory Visit | Admitting: Pulmonary Disease

## 2015-07-20 ENCOUNTER — Inpatient Hospital Stay (HOSPITAL_COMMUNITY)
Admission: RE | Admit: 2015-07-20 | Discharge: 2015-07-20 | Disposition: A | Discharge status: Home / Self Care | Payer: BLUE CROSS/BLUE SHIELD | Source: Ambulatory Visit

## 2015-07-20 SURGERY — BRONCHOSCOPY, WITH FLUOROSCOPY
Anesthesia: Moderate Sedation

## 2015-07-22 ENCOUNTER — Telehealth: Payer: Self-pay | Admitting: Internal Medicine

## 2015-07-22 NOTE — Telephone Encounter (Signed)
She will have to watch her calcium levels as collagen peptides may increase it, otherwise there should not be any other interaction.

## 2015-07-22 NOTE — Telephone Encounter (Signed)
Patient is wanting to see if it is ok that she take cologen peptides (dissolvable hydrolyzed cologen powder supplement) along with her current meds.

## 2015-07-22 NOTE — Telephone Encounter (Signed)
Please advise 

## 2015-07-23 NOTE — Telephone Encounter (Signed)
LVM for pt to call back.

## 2015-07-26 NOTE — Telephone Encounter (Signed)
Patient called to inquire. Advised of below response. She understood.

## 2015-08-01 ENCOUNTER — Other Ambulatory Visit: Payer: Self-pay | Admitting: Internal Medicine

## 2015-08-01 ENCOUNTER — Other Ambulatory Visit: Payer: Self-pay | Admitting: Pulmonary Disease

## 2015-08-02 MED ORDER — IPRATROPIUM-ALBUTEROL 18-103 MCG/ACT IN AERO: 2.0000 | INHALATION_SPRAY | Freq: Four times a day (QID) | RESPIRATORY_TRACT | Status: DC | PRN

## 2015-08-02 NOTE — Telephone Encounter (Signed)
Receive call pt is needing refill on her combivent inhaler to mail order...Sarah Escobar

## 2015-08-03 ENCOUNTER — Telehealth: Payer: Self-pay | Admitting: Pulmonary Disease

## 2015-08-03 MED ORDER — IPRATROPIUM-ALBUTEROL 0.5-2.5 (3) MG/3ML IN SOLN: 3.0000 mL | Freq: Four times a day (QID) | RESPIRATORY_TRACT | Status: DC | PRN

## 2015-08-03 NOTE — Telephone Encounter (Signed)
Called and spoke to pt. Pt requesting Duoneb refill. Rx sent to preferred pharmacy. Pt verbalized understanding and denied any further questions or concerns at this time.

## 2015-08-04 ENCOUNTER — Telehealth: Payer: Self-pay | Admitting: *Deleted

## 2015-08-04 ENCOUNTER — Other Ambulatory Visit: Payer: Self-pay | Admitting: Emergency Medicine

## 2015-08-04 MED ORDER — CLONAZEPAM 0.5 MG PO TABS: 0.5000 mg | ORAL_TABLET | Freq: Every evening | ORAL | Status: DC | PRN

## 2015-08-04 NOTE — Telephone Encounter (Signed)
Receive call pt states she is wanting to go back on the clonazepam. The lorazepam doesn't work for her. Pls advise...Johny Chess

## 2015-08-04 NOTE — Telephone Encounter (Signed)
Clonazepam 0.5 daily at bedtime when necessary dispense 30  Please take lorazepam off the medication list

## 2015-08-04 NOTE — Telephone Encounter (Signed)
Notified pt with md response. [facility]

## 2015-08-10 ENCOUNTER — Other Ambulatory Visit: Payer: Self-pay | Admitting: Emergency Medicine

## 2015-08-10 MED ORDER — IPRATROPIUM-ALBUTEROL 18-103 MCG/ACT IN AERO: 2.0000 | INHALATION_SPRAY | Freq: Four times a day (QID) | RESPIRATORY_TRACT | Status: DC | PRN

## 2015-08-12 ENCOUNTER — Telehealth: Payer: Self-pay | Admitting: Pulmonary Disease

## 2015-08-12 MED ORDER — PREDNISONE 10 MG PO TABS: ORAL_TABLET | ORAL | Status: DC

## 2015-08-12 MED ORDER — AZITHROMYCIN 250 MG PO TABS: ORAL_TABLET | ORAL | Status: DC

## 2015-08-12 NOTE — Telephone Encounter (Signed)
Patient has sinus pressure, sinus drainage down throat causing hoarseness, sore throat.  Coughing up yellow mucus.   Patient is leaving to go to the beach in the morning and would like to have an antibiotic and prednisone called in before she goes to the beach. [facility] [address]   Allergies  Allergen Reactions  . Flagyl [Metronidazole] Other (See Comments)    Nerve pain  . Levofloxacin     rash  . Loperamide Hcl     rash  . Neomycin-Bacitracin Zn-Polymyx     rash  . Ofloxacin     rash  . Oxycodone Hcl     rash  . Sulfonamide Derivatives     hives  . Band-Aid Liquid Bandage [New Skin]     Regular band-aid cause itching and redness  . Gabapentin     Mental status change

## 2015-08-12 NOTE — Telephone Encounter (Signed)
zpak Prednisone 10 mg tabs  Take 2 tabs daily with food x 5ds, then 1 tab daily with food x 5ds then STOP  

## 2015-08-12 NOTE — Telephone Encounter (Signed)
Pt aware of recs. RX's sent in. Nothing further needed 

## 2015-08-13 ENCOUNTER — Other Ambulatory Visit: Payer: Self-pay | Admitting: Pulmonary Disease

## 2015-08-13 MED ORDER — FLUTICASONE FUROATE-VILANTEROL 100-25 MCG/INH IN AEPB: 1.0000 | INHALATION_SPRAY | Freq: Every day | RESPIRATORY_TRACT | Status: DC

## 2015-08-13 NOTE — Telephone Encounter (Signed)
Refill request for Breo 100 Rx sent to pharmacy Nothing further needed.

## 2015-08-23 ENCOUNTER — Telehealth: Payer: Self-pay | Admitting: Pulmonary Disease

## 2015-08-23 NOTE — Telephone Encounter (Signed)
No Needs to be seen as most of the time this is viral anyway so I wouldn't want her to have a complication from the antibiotic. Please recommend saline rinses [facility]) and an office visit with someone this week

## 2015-08-23 NOTE — Telephone Encounter (Signed)
Spoke with pt. States that she is still having issues with her sinuses. Reports sinus congestion, runny nose and tightness in her sinuses. Would like to have another round of antibiotics.  BQ - please advise. Thanks.

## 2015-08-23 NOTE — Telephone Encounter (Signed)
No answer at work #. 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  Pt needs to be scheduled for ov this week.

## 2015-08-24 NOTE — Telephone Encounter (Signed)
Called spoke with pt. She does not have a f/u with ENT and doesn't;t feel she needs to see one. She reports if she decides she wants to see one, she will get a referral from PCP.

## 2015-08-24 NOTE — Telephone Encounter (Signed)
I note that she had multiple sinus surgeries but recent CT looked OK Does she have/ need  FU with ENT ?

## 2015-08-24 NOTE — Telephone Encounter (Signed)
Called pt. She reports she is going to go to an UC. I offered several appts to pt this week and refused. FYI for Dr. Elsworth Soho

## 2015-08-29 ENCOUNTER — Other Ambulatory Visit: Payer: Self-pay | Admitting: Internal Medicine

## 2015-08-30 ENCOUNTER — Telehealth: Payer: Self-pay | Admitting: Internal Medicine

## 2015-08-30 NOTE — Telephone Encounter (Signed)
OK X 1 Needs to establish with new MD

## 2015-08-30 NOTE — Telephone Encounter (Signed)
[facility] 

## 2015-08-30 NOTE — Telephone Encounter (Signed)
States that Dr. Linna Darner told her that Dr. Alain Marion would be a good fit for her once he retired.  Patient would like to know if she could establish with Dr. Alain Marion.  Please advise.

## 2015-08-30 NOTE — Telephone Encounter (Signed)
Please advise, thanks.

## 2015-09-01 NOTE — Telephone Encounter (Signed)
Unfortunately, I'm not able to accept any more new patients at this time.  I'm sorry! Thank you!  

## 2015-09-01 NOTE — Telephone Encounter (Signed)
Left patient vm with MD response.  Also, left her Marya Amsler and Dr. Quay Burow as an option to transfer to.  Told her to call back to schedule an appointment with either provider.

## 2015-09-03 ENCOUNTER — Other Ambulatory Visit: Payer: Self-pay | Admitting: Internal Medicine

## 2015-09-05 ENCOUNTER — Other Ambulatory Visit: Payer: Self-pay | Admitting: Internal Medicine

## 2015-09-06 ENCOUNTER — Other Ambulatory Visit: Payer: Self-pay | Admitting: Emergency Medicine

## 2015-09-06 MED ORDER — SIMVASTATIN 20 MG PO TABS: 20.0000 mg | ORAL_TABLET | Freq: Every evening | ORAL | Status: DC

## 2015-09-23 ENCOUNTER — Encounter: Payer: Self-pay | Admitting: Pulmonary Disease

## 2015-09-23 ENCOUNTER — Ambulatory Visit (INDEPENDENT_AMBULATORY_CARE_PROVIDER_SITE_OTHER): Payer: BLUE CROSS/BLUE SHIELD | Admitting: Pulmonary Disease

## 2015-09-23 VITALS — BP 128/78 | HR 75 | Ht 66.0 in | Wt 144.0 lb

## 2015-09-23 DIAGNOSIS — R05 Cough: Secondary | ICD-10-CM | POA: Diagnosis not present

## 2015-09-23 DIAGNOSIS — R059 Cough, unspecified: Secondary | ICD-10-CM

## 2015-09-23 DIAGNOSIS — J4520 Mild intermittent asthma, uncomplicated: Secondary | ICD-10-CM | POA: Diagnosis not present

## 2015-09-23 NOTE — Addendum Note (Signed)
Addended by: Parke Poisson E on: 09/23/2015 09:44 AM   Modules accepted: Orders

## 2015-09-23 NOTE — Patient Instructions (Addendum)
Check sputum culture & afb Lung function appears mild decreased We will defer bronchoscopy for now Continue to use netti pot & mucinex

## 2015-09-23 NOTE — Progress Notes (Signed)
   Subjective:    Patient ID: Sarah Escobar, female    DOB: Sep 05, 1956, 59 y.o.   MRN: [id number]  HPI  PCP - Linna Darner   59 year old ex-smoker with asthmatic bronchitis & bronchiectasis, hypersensitive to perfumes. She works in Geologist, engineering at [employer].  She smoked 1 PPD for about 30-pack-years before quitting in 2003. She was given a diagnosis of asthma for more than 5 years and maintained on dulera and Spiriva. She developed sinusitis and bronchitis symptoms in January, requiring to urgent care visits and required 2 rounds of antibiotics and steroids.  Reports repeated sinus infections, had multiple sinus surgeries -saw ENT  09/23/2015  Chief Complaint  Patient presents with  . Follow-up    ILD. Pt states that her breathing is doing well. Pt c/o of cough in the morning and evening, mostly wet with clear/yellow mucus. She does notice that she coughs more productively after using her neti-pot. Pt denies wheeze/SOB/CP/tightness.      07/2015  2 sick calls requiring doxy & mucinex & pred taper  08/2015 zpak + pred Deferred bronchoscopy due to cost Uses netty pot Stopped taking Breo Takes mucinex most days  -she always has some sputum production She denies any chest pain, orthopnea, PND, hemoptysis, orthopnea, PND or leg swelling.  Spirometry >> ratio 90, FEV 181%, FVC 71%  Significant tests/ events  PFT 04/2014 - no obstruction, preserved lung volumes, DLCO 76%  CXR on 3/5 and 02/12/14 showed bilateral interstitial infiltrates-right more than left, which were persistent. HRCT 02/2014- Nonspecific pattern of interstitial lung disease with bronchiectasis, subpleural reticulation and architectural distortion. There are minor areas of alveolitis in the left upper and lower lobes Serology neg  hypersens panel neg  02/2015  CT chest  that showed subpleural bronchiectasis, interstitial and patchy groundglass basilar predominance. No progression noted/stable changes Previous left  upper and left lower groundglass has resolved. Lack of progression and lack of honeycombing and areas of central parenchymal involvement argue against usual interstitial pneumonitis.  07/2015 CT sinuses neg  Review of Systems neg for any significant sore throat, dysphagia, itching, sneezing, nasal congestion or excess/ purulent secretions, fever, chills, sweats, unintended wt loss, pleuritic or exertional cp, hempoptysis, orthopnea pnd or change in chronic leg swelling. Also denies presyncope, palpitations, heartburn, abdominal pain, nausea, vomiting, diarrhea or change in bowel or urinary habits, dysuria,hematuria, rash, arthralgias, visual complaints, headache, numbness weakness or ataxia.     Objective:   Physical Exam  Gen. Pleasant, well-nourished, in no distress ENT - no lesions, no post nasal drip Neck: No JVD, no thyromegaly, no carotid bruits Lungs: no use of accessory muscles, no dullness to percussion, clear without rales or rhonchi  Cardiovascular: Rhythm regular, heart sounds  normal, no murmurs or gallops, no peripheral edema Musculoskeletal: No deformities, no cyanosis or clubbing        Assessment & Plan:

## 2015-09-23 NOTE — Assessment & Plan Note (Signed)
Check sputum culture & afb Lung function appears mild decreased We will defer bronchoscopy for now Continue to use netti pot & mucinex

## 2015-09-23 NOTE — Assessment & Plan Note (Signed)
Prednisone for flares only

## 2015-09-24 ENCOUNTER — Telehealth: Payer: Self-pay | Admitting: Pulmonary Disease

## 2015-09-24 NOTE — Telephone Encounter (Signed)
[phone] ext [phone], pt cb and can be at this number until 5pm

## 2015-09-24 NOTE — Telephone Encounter (Signed)
lmtcb for pt.  

## 2015-09-24 NOTE — Telephone Encounter (Signed)
Pt states she saw RA on Thursday morning and he told her that we could send in a prescription for Zpak.  In his OV notes, he mentions Prednisone, but pt is adamant that it was Zpak.   Pt says okay to wait until RA returns on Monday.    RA - please advise.

## 2015-09-24 NOTE — Telephone Encounter (Signed)
lmtcb

## 2015-09-27 NOTE — Telephone Encounter (Signed)
Ok for z pak.

## 2015-09-28 MED ORDER — AZITHROMYCIN 250 MG PO TABS: ORAL_TABLET | ORAL | Status: DC

## 2015-09-28 NOTE — Telephone Encounter (Signed)
Spoke with pt, zpak sent to preferred pharmacy.  Nothing further needed.

## 2015-10-07 ENCOUNTER — Telehealth: Payer: Self-pay | Admitting: *Deleted

## 2015-10-07 NOTE — Telephone Encounter (Signed)
Receive call pt is wanting to transfer to Dr. Alain Marion since Dr. Linna Darner is retiring. She states Dr. Linna Darner recommended Dr. Alain Marion. Inform pt will have to send Dr. Alain Marion a msg to see if he will take her own as a new patient. Is this ok...Johny Chess

## 2015-10-07 NOTE — Telephone Encounter (Signed)
Unfortunately, I'm not able to accept any more new patients at this time.  I'm sorry! Thank you!  

## 2015-10-08 NOTE — Telephone Encounter (Signed)
Called pt no answer LMOM with md response.../lmb 

## 2015-10-11 ENCOUNTER — Other Ambulatory Visit: Payer: Self-pay | Admitting: Internal Medicine

## 2015-10-12 NOTE — Telephone Encounter (Signed)
Rx faxed back to [facility] [place]...Sarah Escobar

## 2015-10-12 NOTE — Telephone Encounter (Signed)
MD out of office pls advise on refill.../lmb 

## 2015-10-23 ENCOUNTER — Other Ambulatory Visit: Payer: Self-pay | Admitting: Internal Medicine

## 2015-11-03 LAB — HM MAMMOGRAPHY: HM MAMMO: NORMAL (ref 0–4)

## 2015-11-04 ENCOUNTER — Ambulatory Visit (INDEPENDENT_AMBULATORY_CARE_PROVIDER_SITE_OTHER): Payer: BLUE CROSS/BLUE SHIELD | Admitting: Internal Medicine

## 2015-11-04 ENCOUNTER — Encounter: Payer: Self-pay | Admitting: Internal Medicine

## 2015-11-04 ENCOUNTER — Telehealth: Payer: Self-pay | Admitting: Internal Medicine

## 2015-11-04 ENCOUNTER — Telehealth: Payer: Self-pay | Admitting: Pulmonary Disease

## 2015-11-04 VITALS — BP 118/80 | HR 71 | Temp 98.0°F | Resp 20 | Ht 66.0 in | Wt 145.5 lb

## 2015-11-04 DIAGNOSIS — J209 Acute bronchitis, unspecified: Secondary | ICD-10-CM | POA: Diagnosis not present

## 2015-11-04 DIAGNOSIS — Z0000 Encounter for general adult medical examination without abnormal findings: Secondary | ICD-10-CM

## 2015-11-04 DIAGNOSIS — G479 Sleep disorder, unspecified: Secondary | ICD-10-CM | POA: Diagnosis not present

## 2015-11-04 MED ORDER — IPRATROPIUM-ALBUTEROL 18-103 MCG/ACT IN AERO: 2.0000 | INHALATION_SPRAY | Freq: Four times a day (QID) | RESPIRATORY_TRACT | Status: DC | PRN

## 2015-11-04 MED ORDER — ALBUTEROL SULFATE (2.5 MG/3ML) 0.083% IN NEBU: INHALATION_SOLUTION | RESPIRATORY_TRACT | Status: DC

## 2015-11-04 MED ORDER — B COMPLEX VITAMINS PO CAPS: 1.0000 | ORAL_CAPSULE | Freq: Every day | ORAL | Status: DC

## 2015-11-04 MED ORDER — BUPROPION HCL ER (XL) 300 MG PO TB24: ORAL_TABLET | ORAL | Status: DC

## 2015-11-04 MED ORDER — METOPROLOL TARTRATE 25 MG PO TABS: ORAL_TABLET | ORAL | Status: DC

## 2015-11-04 MED ORDER — SIMVASTATIN 20 MG PO TABS: 20.0000 mg | ORAL_TABLET | Freq: Every evening | ORAL | Status: DC

## 2015-11-04 MED ORDER — FLUTICASONE PROPIONATE 50 MCG/ACT NA SUSP: NASAL | Status: DC

## 2015-11-04 NOTE — Telephone Encounter (Signed)
FYI: Patient states she is going to come in the morning for lab work.

## 2015-11-04 NOTE — Telephone Encounter (Signed)
LMTCB x1 for pt.  

## 2015-11-04 NOTE — Progress Notes (Signed)
Pre visit review using our clinic review tool, if applicable. No additional management support is needed unless otherwise documented below in the visit note. 

## 2015-11-04 NOTE — Progress Notes (Signed)
   Subjective:    Patient ID: Sarah Escobar, female    DOB: 1956/06/04, 59 y.o.   MRN: [id number]  HPI The patient is here for a physical to assess status of active health conditions.  PMH, FH, & Social History reviewed & updated.Her brother has been diagnosed with prostate cancer. He also may have lung cancer.  She is on a modified heart healthy diet. She had been exercising on a stationary bike four-5 times per week without associated symptoms. She's not exercised recently because of fatigue over the last 3 weeks. Also she has developed a RTI with green nasal discharge and sputum.The latter is greater in volume.  She has found Wellbutrin effective in helping concentration and preventing anxiety and depression. If she does not take clonazepam she cannot sleep. She continues to have occasional night terrors however. She is unsure as to whether she has any restless leg, excessive snoring, or sleep apnea phenomena. None of them reported to her by her husband.  She does have hair loss and has been followed by the Endocrinologist. Thyroid function tests have been variable.  Colonoscopy is up-to-date and she has no  GI symptoms.  Review of Systems  Chest pain, palpitations, tachycardia, exertional dyspnea, paroxysmal nocturnal dyspnea, claudication or edema are absent. No unexplained weight loss, abdominal pain, significant dyspepsia, dysphagia, melena, rectal bleeding, or persistently small caliber stools. Dysuria, pyuria, hematuria, frequency, nocturia or polyuria are denied. Change in skin or nails denied. No bowel changes of constipation or diarrhea. No intolerance to heat or cold.      Objective:   Physical Exam  Pertinent or positive findings include: She has an upper plate and lower partial. There is an operative scar over the left anterior neck. Cervical range of motion is decreased. She has minor rales in a scattered distribution with no increased work of breathing. The pulses are  decreased especially the posterior tibial pulses.  General appearance :adequately nourished; in no distress.  Eyes: No conjunctival inflammation or scleral icterus is present.  Oral exam:  Lips and gums are healthy appearing.There is no oropharyngeal erythema or exudate noted.   Heart:  Normal rate and regular rhythm. S1 and S2 normal without gallop, murmur, click, rub or other extra sounds    Abdomen: bowel sounds normal, soft and non-tender without masses, organomegaly or hernias noted.  No guarding or rebound. No flank tenderness to percussion.  Vascular : no bruits present.  Skin:Warm & dry.  Intact without suspicious lesions or rashes ; no tenting or jaundice   Lymphatic: No lymphadenopathy is noted about the head, neck, axilla.   Neuro: Strength, tone & DTRs normal.      Assessment & Plan:  #1 comprehensive physical exam; no acute findings  #2 fatigue in context of sleep issues  #3 acute bronchitis  Plan: see Orders  & Recommendations

## 2015-11-04 NOTE — Patient Instructions (Addendum)
  Your next office appointment will be determined based upon review of your pending labs  and  xrays  Those written interpretation of the lab results and instructions will be transmitted to you by mail for your records.  Critical results will be called.   Followup as needed for any active or acute issue. Please report any significant change in your symptoms.  To prevent sleep dysfunction follow these instructions for sleep hygiene. Do not read, watch TV, or eat in bed. Do not get into bed until you are ready to turn off the light &  to go to sleep. Do not ingest stimulants ( decongestants, diet pills, nicotine, caffeine) after the evening meal.Do not take daytime naps.Cardiovascular exercise, this can be as simple a program as walking, is recommended 30-45 minutes 3-4 times per week. If you're not exercising you should take 6-8 weeks to build up to this level.Sleep Medicine referral if sleep issues progress.

## 2015-11-05 ENCOUNTER — Other Ambulatory Visit (INDEPENDENT_AMBULATORY_CARE_PROVIDER_SITE_OTHER): Payer: BLUE CROSS/BLUE SHIELD

## 2015-11-05 ENCOUNTER — Encounter: Payer: Self-pay | Admitting: Internal Medicine

## 2015-11-05 ENCOUNTER — Other Ambulatory Visit: Payer: Self-pay | Admitting: Internal Medicine

## 2015-11-05 ENCOUNTER — Encounter: Payer: BLUE CROSS/BLUE SHIELD | Admitting: Internal Medicine

## 2015-11-05 DIAGNOSIS — Z0000 Encounter for general adult medical examination without abnormal findings: Secondary | ICD-10-CM | POA: Diagnosis not present

## 2015-11-05 LAB — BASIC METABOLIC PANEL
BUN: 11 mg/dL (ref 6–23)
CALCIUM: 9.5 mg/dL (ref 8.4–10.5)
CHLORIDE: 103 meq/L (ref 96–112)
CO2: 29 meq/L (ref 19–32)
CREATININE: 0.89 mg/dL (ref 0.40–1.20)
GFR: 68.84 mL/min (ref >60.00)
Glucose, Bld: 87 mg/dL (ref 70–99)
Potassium: 4.2 mEq/L (ref 3.5–5.1)
Sodium: 138 mEq/L (ref 135–145)

## 2015-11-05 LAB — CBC WITH DIFFERENTIAL/PLATELET
BASOS ABS: 0 10*3/uL (ref 0.0–0.1)
Basophils Relative: 0.4 % (ref 0.0–3.0)
EOS ABS: 0.3 10*3/uL (ref 0.0–0.7)
Eosinophils Relative: 3.5 % (ref 0.0–5.0)
HEMATOCRIT: 44.5 % (ref 36.0–46.0)
HEMOGLOBIN: 14.9 g/dL (ref 12.0–15.0)
LYMPHS PCT: 23.2 % (ref 12.0–46.0)
Lymphs Abs: 2.1 10*3/uL (ref 0.7–4.0)
MCHC: 33.5 g/dL (ref 30.0–36.0)
MCV: 96.3 fl (ref 78.0–100.0)
MONOS PCT: 6.3 % (ref 3.0–12.0)
Monocytes Absolute: 0.6 10*3/uL (ref 0.1–1.0)
Neutro Abs: 6.1 10*3/uL (ref 1.4–7.7)
Neutrophils Relative %: 66.6 % (ref 43.0–77.0)
Platelets: 224 10*3/uL (ref 150.0–400.0)
RBC: 4.62 Mil/uL (ref 3.87–5.11)
RDW: 12.3 % (ref 11.5–15.5)
WBC: 9.1 10*3/uL (ref 4.0–10.5)

## 2015-11-05 LAB — T3, FREE: T3 FREE: 3.6 pg/mL (ref 2.3–4.2)

## 2015-11-05 LAB — LIPID PANEL
CHOL/HDL RATIO: 3
Cholesterol: 132 mg/dL (ref 0–200)
HDL: 41.5 mg/dL (ref >39.00)
LDL Cholesterol: 75 mg/dL (ref 0–99)
NONHDL: 90.23
Triglycerides: 74 mg/dL (ref 0.0–149.0)
VLDL: 14.8 mg/dL (ref 0.0–40.0)

## 2015-11-05 LAB — HEPATIC FUNCTION PANEL
ALK PHOS: 91 U/L (ref 39–117)
ALT: 45 U/L — AB (ref 0–35)
AST: 42 U/L — AB (ref 0–37)
Albumin: 4.3 g/dL (ref 3.5–5.2)
BILIRUBIN DIRECT: 0.1 mg/dL (ref 0.0–0.3)
TOTAL PROTEIN: 7.2 g/dL (ref 6.0–8.3)
Total Bilirubin: 0.4 mg/dL (ref 0.2–1.2)

## 2015-11-05 LAB — TSH: TSH: 2.04 u[IU]/mL (ref 0.35–4.50)

## 2015-11-05 LAB — T4, FREE: FREE T4: 0.74 ng/dL (ref 0.60–1.60)

## 2015-11-05 MED ORDER — FLUTICASONE PROPIONATE 50 MCG/ACT NA SUSP: NASAL | Status: DC

## 2015-11-05 MED ORDER — CLONAZEPAM 0.5 MG PO TABS: 0.5000 mg | ORAL_TABLET | Freq: Every evening | ORAL | Status: DC | PRN

## 2015-11-05 MED ORDER — DOXYCYCLINE HYCLATE 100 MG PO TABS: 100.0000 mg | ORAL_TABLET | Freq: Two times a day (BID) | ORAL | Status: DC

## 2015-11-05 MED ORDER — SIMVASTATIN 20 MG PO TABS: 20.0000 mg | ORAL_TABLET | Freq: Every evening | ORAL | Status: DC

## 2015-11-05 MED ORDER — BUPROPION HCL ER (XL) 300 MG PO TB24: ORAL_TABLET | ORAL | Status: DC

## 2015-11-05 MED ORDER — METOPROLOL TARTRATE 25 MG PO TABS: ORAL_TABLET | ORAL | Status: DC

## 2015-11-05 NOTE — Telephone Encounter (Signed)
Pt requesting to pick up 2 sputum cups - cups placed up front. Pt states that she was unsuccessful when order was placed 09/23/15 States that she wishes to try again. Orders are still in [facility] Pt to let us know when she is able drops these samples off so that we can keep an eye out for the results. Will send to Michelle/Dr Elsworth Soho as Juluis Rainier.

## 2015-11-05 NOTE — Telephone Encounter (Signed)
Reminder set to follow up. Closing encounter

## 2015-11-15 ENCOUNTER — Telehealth: Payer: Self-pay

## 2015-11-15 ENCOUNTER — Telehealth: Payer: Self-pay | Admitting: Internal Medicine

## 2015-11-15 NOTE — Telephone Encounter (Signed)
Pt would like to transfer to you. Sarah Escobar is pt current PCP.

## 2015-11-15 NOTE — Telephone Encounter (Signed)
Pt called back. Can you please call her at work. Her number is [phone]

## 2015-11-15 NOTE — Telephone Encounter (Signed)
Patient would like a call back to discuss the results of her labs taken last week.

## 2015-11-15 NOTE — Telephone Encounter (Signed)
Left message with husband for patient to call back to schedule appointment.

## 2015-11-15 NOTE — Telephone Encounter (Signed)
LVM for pt to call back about labs

## 2015-11-15 NOTE — Telephone Encounter (Signed)
Spoke with pt, per [facility] Pt can take 10 mg of Simvastatin and follow-up in 8 weeks for repeat labs.

## 2015-11-15 NOTE — Telephone Encounter (Signed)
OK. Thx

## 2015-11-15 NOTE — Telephone Encounter (Signed)
LVM for pt to call back.

## 2015-11-15 NOTE — Telephone Encounter (Signed)
Can you contact pt and have her come back in 8 weeks?  New pt appt with Dr. Alain Marion.   Notes: transfer from [facility]/follow up on elevated Live Enzymes/repeat labs

## 2015-11-15 NOTE — Telephone Encounter (Signed)
She is a very sweet person and motivated

## 2015-11-16 NOTE — Telephone Encounter (Signed)
Patient scheduled appointment.

## 2015-12-16 ENCOUNTER — Encounter: Payer: Self-pay | Admitting: Internal Medicine

## 2016-01-09 ENCOUNTER — Other Ambulatory Visit: Payer: Self-pay | Admitting: Pulmonary Disease

## 2016-01-10 ENCOUNTER — Encounter: Payer: Self-pay | Admitting: Internal Medicine

## 2016-01-10 ENCOUNTER — Telehealth: Payer: Self-pay | Admitting: Gastroenterology

## 2016-01-10 ENCOUNTER — Other Ambulatory Visit (INDEPENDENT_AMBULATORY_CARE_PROVIDER_SITE_OTHER): Payer: BLUE CROSS/BLUE SHIELD

## 2016-01-10 ENCOUNTER — Ambulatory Visit (INDEPENDENT_AMBULATORY_CARE_PROVIDER_SITE_OTHER): Payer: BLUE CROSS/BLUE SHIELD | Admitting: Internal Medicine

## 2016-01-10 VITALS — BP 130/80 | HR 74 | Wt 143.0 lb

## 2016-01-10 DIAGNOSIS — I2510 Atherosclerotic heart disease of native coronary artery without angina pectoris: Secondary | ICD-10-CM

## 2016-01-10 DIAGNOSIS — R7989 Other specified abnormal findings of blood chemistry: Secondary | ICD-10-CM | POA: Diagnosis not present

## 2016-01-10 DIAGNOSIS — R945 Abnormal results of liver function studies: Principal | ICD-10-CM

## 2016-01-10 DIAGNOSIS — I10 Essential (primary) hypertension: Secondary | ICD-10-CM

## 2016-01-10 LAB — PROTIME-INR
INR: 1 ratio (ref 0.8–1.0)
PROTHROMBIN TIME: 10 s (ref 9.6–13.1)

## 2016-01-10 LAB — HEPATIC FUNCTION PANEL
ALBUMIN: 4.3 g/dL (ref 3.5–5.2)
ALT: 34 U/L (ref 0–35)
AST: 33 U/L (ref 0–37)
Alkaline Phosphatase: 88 U/L (ref 39–117)
BILIRUBIN TOTAL: 0.3 mg/dL (ref 0.2–1.2)
Bilirubin, Direct: 0 mg/dL (ref 0.0–0.3)
Total Protein: 7 g/dL (ref 6.0–8.3)

## 2016-01-10 LAB — VITAMIN B12: Vitamin B-12: 1161 pg/mL — ABNORMAL HIGH (ref 211–911)

## 2016-01-10 LAB — VITAMIN D 25 HYDROXY (VIT D DEFICIENCY, FRACTURES): VITD: 62.09 ng/mL (ref 30.00–100.00)

## 2016-01-10 LAB — SEDIMENTATION RATE: SED RATE: 7 mm/h (ref 0–22)

## 2016-01-10 LAB — IBC PANEL
Iron: 106 ug/dL (ref 42–145)
Saturation Ratios: 28.6 % (ref 20.0–50.0)
Transferrin: 265 mg/dL (ref 212.0–360.0)

## 2016-01-10 MED ORDER — FLUTICASONE PROPIONATE 50 MCG/ACT NA SUSP: NASAL | Status: DC

## 2016-01-10 MED ORDER — ASPIRIN EC 81 MG PO TBEC: 81.0000 mg | DELAYED_RELEASE_TABLET | Freq: Every day | ORAL | Status: DC

## 2016-01-10 NOTE — Patient Instructions (Signed)
Gluten free trial (no wheat products) for 4-6 weeks. OK to use gluten-free bread and gluten-free pasta.     

## 2016-01-10 NOTE — Assessment & Plan Note (Signed)
Card ref - Dr Harrington Challenger Start ASA Cont Zocor

## 2016-01-10 NOTE — Telephone Encounter (Signed)
Gessner pt. 

## 2016-01-10 NOTE — Progress Notes (Signed)
Pre visit review using our clinic review tool, if applicable. No additional management support is needed unless otherwise documented below in the visit note. 

## 2016-01-10 NOTE — Assessment & Plan Note (Signed)
Abd US Labs 

## 2016-01-11 LAB — ANA: Anti Nuclear Antibody(ANA): NEGATIVE

## 2016-01-11 NOTE — Telephone Encounter (Signed)
Spoke with patient and she didn't have questions about colon prep so we clarified that.

## 2016-01-11 NOTE — Telephone Encounter (Signed)
Left message to call me back.

## 2016-01-13 ENCOUNTER — Ambulatory Visit (INDEPENDENT_AMBULATORY_CARE_PROVIDER_SITE_OTHER): Payer: BLUE CROSS/BLUE SHIELD | Admitting: Internal Medicine

## 2016-01-13 ENCOUNTER — Encounter: Payer: Self-pay | Admitting: Internal Medicine

## 2016-01-13 VITALS — BP 122/64 | HR 64 | Ht 66.0 in | Wt 143.0 lb

## 2016-01-13 DIAGNOSIS — I2510 Atherosclerotic heart disease of native coronary artery without angina pectoris: Secondary | ICD-10-CM

## 2016-01-13 NOTE — Progress Notes (Signed)
Cardiology Office Note   Date:  01/13/2016   ID:  Sarah Escobar, DOB [birth year], MRN [id number]  PCP:  Walker Kehr, MD  Cardiologist:   Dorris Carnes, MD   Referred for eval of coronary artery calcification     History of Present Illness: Sarah Escobar is a 60 y.o. female with a history of pumonary fibrosis  CT one year ago showed coronary artery calcifications    In May of last year was using stationary bike  Stopped because of fatigue, work schedule   30 min on bike  4x per week     Denies CP  Breathing is OK     Current Outpatient Prescriptions  Medication Sig Dispense Refill  . albuterol (PROVENTIL) (2.5 MG/3ML) 0.083% nebulizer solution INHALE CONTENTS OF ONE NEBULE VIA NEBULIZER EVERY 4 HOURS AS NEEDED 300 mL 1  . albuterol-ipratropium (COMBIVENT) 18-103 MCG/ACT inhaler Inhale 2 puffs into the lungs every 6 (six) hours as needed for wheezing. 3 Inhaler 1  . aspirin EC 81 MG tablet Take 1 tablet (81 mg total) by mouth daily. 100 tablet 3  . Biotin 5000 MCG CAPS Take 2 capsules by mouth daily.    Marland Kitchen BREO ELLIPTA 100-25 MCG/INH AEPB Use 1 inhalation daily 180 each 0  . buPROPion (WELLBUTRIN XL) 300 MG 24 hr tablet TAKE 1 TABLET EVERY MORNING 90 tablet 1  . Cholecalciferol 2000 units TABS Take 1 tablet by mouth daily.    . clobetasol (OLUX) 0.05 % topical foam Reported on 01/10/2016  5  . clonazePAM (KLONOPIN) 0.5 MG tablet Take 1 tablet (0.5 mg total) by mouth at bedtime as needed. 90 tablet 0  . Est Estrogens-Methyltest (ESTRATEST H.S. PO) Take by mouth daily.    . feeding supplement, ENSURE ENLIVE, (ENSURE ENLIVE) LIQD Take 237 mLs by mouth 2 (two) times daily between meals. 237 mL 12  . fexofenadine (ALLEGRA) 180 MG tablet Take 180 mg by mouth daily.    . Flaxseed, Linseed, (FLAXSEED OIL) 1000 MG CAPS Take by mouth 2 (two) times daily.    . fluticasone (FLONASE) 50 MCG/ACT nasal spray USE ONE SPRAY IN EACH NOSTRIL TWICE DAILY AS NEEDED 48 g 3  . GuaiFENesin (MUCINEX  MAXIMUM STRENGTH PO) Take 2 tablets by mouth daily.    Marland Kitchen ipratropium-albuterol (DUONEB) 0.5-2.5 (3) MG/3ML SOLN Inhale 3 mLs into the lungs every 6 (six) hours as needed (Dx: J45.30). 360 mL 5  . metoprolol tartrate (LOPRESSOR) 25 MG tablet TAKE 1/2 TABLET BY MOUTH  TWICE DAILY 90 tablet 3  . Multiple Vitamins-Minerals (ONE-A-DAY 50 PLUS PO) Take by mouth daily.    . simvastatin (ZOCOR) 20 MG tablet Take 1 tablet (20 mg total) by mouth every evening. (Patient taking differently: Take 10 mg by mouth every evening. ) 90 tablet 3  . UNABLE TO FIND 1 scoop daily. Med Name: Collagen Peptides     No current facility-administered medications for this visit.    Allergies:   Flagyl; Levofloxacin; Loperamide hcl; Neomycin-bacitracin zn-polymyx; Ofloxacin; Oxycodone hcl; Sulfonamide derivatives; Band-aid liquid bandage; and Gabapentin   Past Medical History  Diagnosis Date  . Hyperlipidemia     LDL goal = < 100  . Hypertension   . Asthma     adult onset  . Depression   . Sciatica of left side     intermittent  . Allergy   . Personal history of adenomatous colonic polyps/FHx colon cancer sister and father     [name]  Past Surgical History  Procedure Laterality Date  . Total abdominal hysterectomy w/ bilateral salpingoophorectomy  2003    endometriosis   . Nasal sinus surgery  [phone]    X 3  . Cervical fusion  1999    Dr Arnoldo Morale, [place]  . Colonoscopy w/ polypectomy  multiple    adenomas; [name]; last 2013  . Discetomy lumbar  02/19/13    L1&2; Dr Arnoldo Morale, [place]     Social History:  The patient  reports that she quit smoking about 14 years ago. Her smoking use included Cigarettes. She has a 30 pack-year smoking history. She has never used smokeless tobacco. She reports that she drinks alcohol. She reports that she does not use illicit drugs.   Family History:  The patient's family history includes Anemia in her brother; Asthma in her maternal aunt, maternal grandmother,  mother, and sister; Cirrhosis in her brother and mother; Colitis in her mother; Colon cancer in her father and sister; Diabetes in her father; Heart attack (age of onset: 78) in her sister; Heart attack (age of onset: 38) in her father; Prostate cancer in her brother; Stroke (age of onset: 70) in her father.    ROS:  Please see the history of present illness. All other systems are reviewed and  Negative to the above problem except as noted.    PHYSICAL EXAM: VS:  BP 122/64 mmHg  Pulse 64  Ht 5\' 6"  (1.676 m)  Wt 143 lb (64.864 kg)  BMI 23.09 kg/m2  GEN: Well nourished, well developed, in no acute distress HEENT: normalLesion on bridge of nose  Red   Neck: no JVD, carotid bruits, or masses Cardiac: RRR; no murmurs, rubs, or gallops,no edema  Respiratory:  clear to auscultation bilaterally, normal work of breathing GI: soft, nontender, nondistended, + BS  No hepatomegaly  MS: no deformity Moving all extremities   Skin: warm and dry, no rash Neuro:  Strength and sensation are intact Psych: euthymic mood, full affect   EKG:  EKG is ordered today.  SR  65 bpm     Lipid Panel    Component Value Date/Time   CHOL 132 11/05/2015 0756   TRIG 74.0 11/05/2015 0756   HDL 41.50 11/05/2015 0756   CHOLHDL 3 11/05/2015 0756   VLDL 14.8 11/05/2015 0756   LDLCALC 75 11/05/2015 0756      Wt Readings from Last 3 Encounters:  01/13/16 143 lb (64.864 kg)  01/10/16 143 lb (64.864 kg)  11/04/15 145 lb 8 oz (65.998 kg)      ASSESSMENT AND PLAN:  1  CAD  Pt with CAD documented on CT of chest  Agree with ASA and statin  I have pushed her to give history  I am not convinced of acitve symptoms  I would recomm that she get back to exercising and follow  I will f/u in clinic in November    2  HL  Cut back on Zocor due to elevated LFTs  Will get lipid risk panel in 1 month  F/U in November    Signed, Janessa Mickle, MD  01/13/2016 9:09 AM    [facility] [address],  [place], [place]  [address] Phone: [phone]; Fax: [phone]

## 2016-01-13 NOTE — Assessment & Plan Note (Signed)
Toprol 

## 2016-01-13 NOTE — Patient Instructions (Addendum)
Your physician recommends that you continue on your current medications as directed. Please refer to the Current Medication list given to you today. Your physician recommends that you return for lab work IN ABOUT 1 MONTH ([place])  Your physician wants you to follow-up in: November, 2017 WITH DR. Harrington Challenger.  You will receive a reminder letter in the mail two months in advance. If you don't receive a letter, please call our office to schedule the follow-up appointment.

## 2016-01-13 NOTE — Progress Notes (Signed)
Subjective:  Patient ID: Sarah Escobar, female    DOB: 08-Sep-1956  Age: 60 y.o. MRN: [id number]  CC: No chief complaint on file.   HPI [place] presents for elevated LFTs, dyslipidemia, depression f/u  Outpatient Prescriptions Prior to Visit  Medication Sig Dispense Refill  . albuterol (PROVENTIL) (2.5 MG/3ML) 0.083% nebulizer solution INHALE CONTENTS OF ONE NEBULE VIA NEBULIZER EVERY 4 HOURS AS NEEDED 300 mL 1  . albuterol-ipratropium (COMBIVENT) 18-103 MCG/ACT inhaler Inhale 2 puffs into the lungs every 6 (six) hours as needed for wheezing. 3 Inhaler 1  . Biotin 5000 MCG CAPS Take 2 capsules by mouth daily.    Marland Kitchen buPROPion (WELLBUTRIN XL) 300 MG 24 hr tablet TAKE 1 TABLET EVERY MORNING 90 tablet 1  . clonazePAM (KLONOPIN) 0.5 MG tablet Take 1 tablet (0.5 mg total) by mouth at bedtime as needed. 90 tablet 0  . Est Estrogens-Methyltest (ESTRATEST H.S. PO) Take by mouth daily.    . feeding supplement, ENSURE ENLIVE, (ENSURE ENLIVE) LIQD Take 237 mLs by mouth 2 (two) times daily between meals. 237 mL 12  . fexofenadine (ALLEGRA) 180 MG tablet Take 180 mg by mouth daily.    . Flaxseed, Linseed, (FLAXSEED OIL) 1000 MG CAPS Take by mouth 2 (two) times daily.    Marland Kitchen ipratropium-albuterol (DUONEB) 0.5-2.5 (3) MG/3ML SOLN Inhale 3 mLs into the lungs every 6 (six) hours as needed (Dx: J45.30). 360 mL 5  . metoprolol tartrate (LOPRESSOR) 25 MG tablet TAKE 1/2 TABLET BY MOUTH  TWICE DAILY 90 tablet 3  . Multiple Vitamins-Minerals (ONE-A-DAY 50 PLUS PO) Take by mouth daily.    . simvastatin (ZOCOR) 20 MG tablet Take 1 tablet (20 mg total) by mouth every evening. (Patient taking differently: Take 10 mg by mouth every evening. ) 90 tablet 3  . UNABLE TO FIND 1 scoop daily. Med Name: Collagen Peptides    . b complex vitamins capsule Take 1 capsule by mouth daily. 30 capsule 3  . doxycycline (VIBRA-TABS) 100 MG tablet Take 1 tablet (100 mg total) by mouth 2 (two) times daily. 20 tablet 0  .  fluticasone (FLONASE) 50 MCG/ACT nasal spray USE ONE SPRAY IN EACH NOSTRIL TWICE DAILY AS NEEDED 16 g 1  . Fluticasone Furoate-Vilanterol (BREO ELLIPTA) 100-25 MCG/INH AEPB Inhale 1 puff into the lungs daily. 3 each 1  . folic acid (FOLVITE) [id number] MCG tablet Take 400 mcg by mouth daily.     No facility-administered medications prior to visit.    ROS Review of Systems  Objective:  BP 130/80 mmHg  Pulse 74  Wt 143 lb (64.864 kg)  SpO2 96%  BP Readings from Last 3 Encounters:  01/13/16 122/64  01/10/16 130/80  11/04/15 118/80    Wt Readings from Last 3 Encounters:  01/13/16 143 lb (64.864 kg)  01/10/16 143 lb (64.864 kg)  11/04/15 145 lb 8 oz (65.998 kg)    Physical Exam  Lab Results  Component Value Date   WBC 9.1 11/05/2015   HGB 14.9 11/05/2015   HCT 44.5 11/05/2015   PLT 224.0 11/05/2015   GLUCOSE 87 11/05/2015   CHOL 132 11/05/2015   TRIG 74.0 11/05/2015   HDL 41.50 11/05/2015   LDLCALC 75 11/05/2015   ALT 34 01/10/2016   AST 33 01/10/2016   NA 138 11/05/2015   K 4.2 11/05/2015   CL 103 11/05/2015   CREATININE 0.89 11/05/2015   BUN 11 11/05/2015   CO2 29 11/05/2015   TSH 2.04 11/05/2015  INR 1.0 01/10/2016    No results found.  Assessment & Plan:   Diagnoses and all orders for this visit:  Elevated LFTs -     Hepatic function panel; Future -     US Abdomen Complete -     Hepatitis C antibody; Future -     Protime-INR; Future -     ANA; Future -     Vitamin B12; Future -     VITAMIN D 25 Hydroxy (Vit-D Deficiency, Fractures); Future -     Sedimentation rate; Future -     IBC panel; Future -     Ceruloplasmin -     Hepatitis B E Antigen -     Hepatitis B core antibody, IgM -     Hepatitis B surface ag-pre vc imm st -     Hepatitis B surface antibody  Coronary artery calcification seen on CAT scan -     Ambulatory referral to Cardiology -     Hepatitis C antibody; Future -     Protime-INR; Future -     ANA; Future -     Vitamin B12;  Future -     VITAMIN D 25 Hydroxy (Vit-D Deficiency, Fractures); Future -     Sedimentation rate; Future -     IBC panel; Future -     Ceruloplasmin -     Hepatitis B E Antigen -     Hepatitis B core antibody, IgM -     Hepatitis B surface ag-pre vc imm st -     Hepatitis B surface antibody  Other orders -     fluticasone (FLONASE) 50 MCG/ACT nasal spray; USE ONE SPRAY IN EACH NOSTRIL TWICE DAILY AS NEEDED -     aspirin EC 81 MG tablet; Take 1 tablet (81 mg total) by mouth daily.   I have discontinued Ms. Largo's folic acid, b complex vitamins, and doxycycline. I am also having her start on aspirin EC. Additionally, I am having her maintain her Est Estrogens-Methyltest (ESTRATEST H.S. PO), fexofenadine, Flaxseed Oil, Multiple Vitamins-Minerals (ONE-A-DAY 50 PLUS PO), Biotin, feeding supplement (ENSURE ENLIVE), ipratropium-albuterol, UNABLE TO FIND, albuterol, albuterol-ipratropium, simvastatin, metoprolol tartrate, buPROPion, clonazePAM, clobetasol, Cholecalciferol, GuaiFENesin (MUCINEX MAXIMUM STRENGTH PO), and fluticasone.  Meds ordered this encounter  Medications  . clobetasol (OLUX) 0.05 % topical foam    Sig: Reported on 01/10/2016    Refill:  5  . Cholecalciferol 2000 units TABS    Sig: Take 1 tablet by mouth daily.  . GuaiFENesin (MUCINEX MAXIMUM STRENGTH PO)    Sig: Take 2 tablets by mouth daily.  . fluticasone (FLONASE) 50 MCG/ACT nasal spray    Sig: USE ONE SPRAY IN EACH NOSTRIL TWICE DAILY AS NEEDED    Dispense:  48 g    Refill:  3    Disp #3  . aspirin EC 81 MG tablet    Sig: Take 1 tablet (81 mg total) by mouth daily.    Dispense:  100 tablet    Refill:  3     Follow-up: Return in about 3 months (around 04/08/2016) for a follow-up visit.  Walker Kehr, MD

## 2016-01-14 ENCOUNTER — Encounter: Payer: Self-pay | Admitting: Nurse Practitioner

## 2016-01-14 ENCOUNTER — Ambulatory Visit (INDEPENDENT_AMBULATORY_CARE_PROVIDER_SITE_OTHER): Payer: BLUE CROSS/BLUE SHIELD | Admitting: Nurse Practitioner

## 2016-01-14 ENCOUNTER — Telehealth: Payer: Self-pay | Admitting: Internal Medicine

## 2016-01-14 VITALS — BP 136/88 | HR 70 | Temp 97.9°F | Resp 16 | Wt 144.0 lb

## 2016-01-14 DIAGNOSIS — R238 Other skin changes: Secondary | ICD-10-CM

## 2016-01-14 DIAGNOSIS — L989 Disorder of the skin and subcutaneous tissue, unspecified: Secondary | ICD-10-CM

## 2016-01-14 MED ORDER — DOXYCYCLINE HYCLATE 100 MG PO TABS: 100.0000 mg | ORAL_TABLET | Freq: Two times a day (BID) | ORAL | Status: DC

## 2016-01-14 NOTE — Telephone Encounter (Signed)
Pt stated that she got the answer to her question.

## 2016-01-14 NOTE — Telephone Encounter (Signed)
Pt called you back. I let her know Dr. Parks Neptune notes. She said she does have another question and if you could please call her back Best # is [phone]

## 2016-01-14 NOTE — Progress Notes (Signed)
Pre visit review using our clinic review tool, if applicable. No additional management support is needed unless otherwise documented below in the visit note. 

## 2016-01-14 NOTE — Patient Instructions (Signed)
Cold wash cloth on the site, Advil, and doxycyline.   If anything changes or doesn't work we have a Sat. Clinic or you can call Monday.

## 2016-01-14 NOTE — Progress Notes (Signed)
Patient ID: Sarah Escobar, female    DOB: [birth year]  Age: 60 y.o. MRN: [id number]  CC: Belepharitis   HPI Sarah Escobar presents for 1 week of right eye  1) Started as a pimple Worsening x 7 days  Kept covered with a band-aid  Drained clear   Treatment to date: Hot wash cloth  Neosporin- allergy- got worse  Keeping covered  Bacitracin- plain used   Denies changes to vision  History Sarah Escobar has a past medical history of Hyperlipidemia; Hypertension; Asthma; Depression; Sciatica of left side; Allergy; and Personal history of adenomatous colonic polyps/FHx colon cancer sister and father.   Sarah Escobar has past surgical history that includes Total abdominal hysterectomy w/ bilateral salpingoophorectomy (2003); Nasal sinus surgery [phone]); Cervical fusion (1999); Colonoscopy w/ polypectomy (multiple); and discetomy lumbar (02/19/13).   Her family history includes Anemia in her brother; Asthma in her maternal aunt, maternal grandmother, mother, and sister; Cirrhosis in her brother and mother; Colitis in her mother; Colon cancer in her father and sister; Diabetes in her father; Heart attack (age of onset: 93) in her sister; Heart attack (age of onset: 8) in her father; Prostate cancer in her brother; Stroke (age of onset: 15) in her father.Sarah Escobar reports that Sarah Escobar quit smoking about 14 years ago. Her smoking use included Cigarettes. Sarah Escobar has a 30 pack-year smoking history. Sarah Escobar has never used smokeless tobacco. Sarah Escobar reports that Sarah Escobar drinks alcohol. Sarah Escobar reports that Sarah Escobar does not use illicit drugs.  Outpatient Prescriptions Prior to Visit  Medication Sig Dispense Refill  . albuterol (PROVENTIL) (2.5 MG/3ML) 0.083% nebulizer solution INHALE CONTENTS OF ONE NEBULE VIA NEBULIZER EVERY 4 HOURS AS NEEDED 300 mL 1  . albuterol-ipratropium (COMBIVENT) 18-103 MCG/ACT inhaler Inhale 2 puffs into the lungs every 6 (six) hours as needed for wheezing. 3 Inhaler 1  . aspirin EC 81 MG tablet Take 1 tablet (81 mg  total) by mouth daily. 100 tablet 3  . Biotin 5000 MCG CAPS Take 2 capsules by mouth daily.    Marland Kitchen BREO ELLIPTA 100-25 MCG/INH AEPB Use 1 inhalation daily 180 each 0  . buPROPion (WELLBUTRIN XL) 300 MG 24 hr tablet TAKE 1 TABLET EVERY MORNING 90 tablet 1  . Cholecalciferol 2000 units TABS Take 1 tablet by mouth daily.    . clobetasol (OLUX) 0.05 % topical foam Reported on 01/10/2016  5  . clonazePAM (KLONOPIN) 0.5 MG tablet Take 1 tablet (0.5 mg total) by mouth at bedtime as needed. 90 tablet 0  . Est Estrogens-Methyltest (ESTRATEST H.S. PO) Take by mouth daily.    . feeding supplement, ENSURE ENLIVE, (ENSURE ENLIVE) LIQD Take 237 mLs by mouth 2 (two) times daily between meals. 237 mL 12  . fexofenadine (ALLEGRA) 180 MG tablet Take 180 mg by mouth daily.    . Flaxseed, Linseed, (FLAXSEED OIL) 1000 MG CAPS Take by mouth 2 (two) times daily.    . fluticasone (FLONASE) 50 MCG/ACT nasal spray USE ONE SPRAY IN EACH NOSTRIL TWICE DAILY AS NEEDED 48 g 3  . GuaiFENesin (MUCINEX MAXIMUM STRENGTH PO) Take 2 tablets by mouth daily.    Marland Kitchen ipratropium-albuterol (DUONEB) 0.5-2.5 (3) MG/3ML SOLN Inhale 3 mLs into the lungs every 6 (six) hours as needed (Dx: J45.30). 360 mL 5  . metoprolol tartrate (LOPRESSOR) 25 MG tablet TAKE 1/2 TABLET BY MOUTH  TWICE DAILY 90 tablet 3  . Multiple Vitamins-Minerals (ONE-A-DAY 50 PLUS PO) Take by mouth daily.    . simvastatin (ZOCOR) 20 MG tablet Take 1 tablet (  20 mg total) by mouth every evening. (Patient taking differently: Take 10 mg by mouth every evening. ) 90 tablet 3  . UNABLE TO FIND 1 scoop daily. Med Name: Collagen Peptides     No facility-administered medications prior to visit.    ROS Review of Systems  Constitutional: Negative for fever, chills, diaphoresis and fatigue.  Eyes: Negative for photophobia, pain, discharge, redness, itching and visual disturbance.    Objective:  BP 136/88 mmHg  Pulse 70  Temp(Src) 97.9 F (36.6 C) (Oral)  Resp 16  Wt 144 lb  (65.318 kg)  SpO2 98%  Physical Exam  Constitutional: Sarah Escobar is oriented to person, place, and time. Sarah Escobar appears well-developed and well-nourished. No distress.  HENT:  Head: Normocephalic and atraumatic.  Right Ear: External ear normal.  Left Ear: External ear normal.  Eyes: Conjunctivae and EOM are normal. Pupils are equal, round, and reactive to light. Right eye exhibits no discharge. Left eye exhibits no discharge. No scleral icterus.    Solid area of erythema, non-fluctuant, tender to palpation, non-draining (red solid on pic) Some swelling and tenderness extends towards inner canthus of right eye (red outline)  Neurological: Sarah Escobar is alert and oriented to person, place, and time.  Skin: Skin is warm and dry. Sarah Escobar is not diaphoretic. There is erythema.  Psychiatric: Sarah Escobar has a normal mood and affect. Her behavior is normal. Judgment and thought content normal.      Assessment & Plan:   Sarah Escobar was seen today for belepharitis.  Diagnoses and all orders for this visit:  Skin irritation  Other orders -     doxycycline (VIBRA-TABS) 100 MG tablet; Take 1 tablet (100 mg total) by mouth 2 (two) times daily.  I am having Sarah Escobar start on doxycycline. I am also having her maintain her Est Estrogens-Methyltest (ESTRATEST H.S. PO), fexofenadine, Flaxseed Oil, Multiple Vitamins-Minerals (ONE-A-DAY 50 PLUS PO), Biotin, feeding supplement (ENSURE ENLIVE), ipratropium-albuterol, UNABLE TO FIND, albuterol, albuterol-ipratropium, simvastatin, metoprolol tartrate, buPROPion, clonazePAM, BREO ELLIPTA, clobetasol, Cholecalciferol, GuaiFENesin (MUCINEX MAXIMUM STRENGTH PO), fluticasone, and aspirin EC.  Meds ordered this encounter  Medications  . doxycycline (VIBRA-TABS) 100 MG tablet    Sig: Take 1 tablet (100 mg total) by mouth 2 (two) times daily.    Dispense:  10 tablet    Refill:  0    Order Specific Question:  Supervising Provider    Answer:  Crecencio Mc [[id number]]     Follow-up: Return  if symptoms worsen or fail to improve.

## 2016-01-18 DIAGNOSIS — R238 Other skin changes: Secondary | ICD-10-CM | POA: Insufficient documentation

## 2016-01-18 NOTE — Assessment & Plan Note (Signed)
New onset sinus infection No problem with vision or sign of infection at this time Pt is worried about worsening over the weekend Asked her to try doxycyline if worsens or drains (sent to pharmacy) Advised to keep uncovered, do not touch more than necessary, clean as normal, no ointment.

## 2016-01-21 ENCOUNTER — Ambulatory Visit
Admission: RE | Admit: 2016-01-21 | Discharge: 2016-01-21 | Disposition: A | Discharge status: Home / Self Care | Payer: BLUE CROSS/BLUE SHIELD | Source: Ambulatory Visit | Attending: Internal Medicine | Admitting: Internal Medicine

## 2016-01-24 ENCOUNTER — Ambulatory Visit: Payer: BLUE CROSS/BLUE SHIELD | Admitting: Adult Health

## 2016-02-09 ENCOUNTER — Other Ambulatory Visit: Payer: Self-pay | Admitting: Internal Medicine

## 2016-02-10 ENCOUNTER — Other Ambulatory Visit (INDEPENDENT_AMBULATORY_CARE_PROVIDER_SITE_OTHER): Payer: BLUE CROSS/BLUE SHIELD | Admitting: *Deleted

## 2016-02-10 ENCOUNTER — Other Ambulatory Visit: Payer: Self-pay | Admitting: Internal Medicine

## 2016-02-10 DIAGNOSIS — I2510 Atherosclerotic heart disease of native coronary artery without angina pectoris: Secondary | ICD-10-CM | POA: Diagnosis not present

## 2016-02-15 ENCOUNTER — Other Ambulatory Visit: Payer: Self-pay | Admitting: Internal Medicine

## 2016-02-15 LAB — CARDIO IQ(R) ADVANCED LIPID PANEL
Apolipoprotein B: 66 mg/dL (ref 49–103)
CHOLESTEROL, TOTAL (CARDIO IQ ADV LIPID PANEL): 132 mg/dL (ref 125–200)
CHOLESTEROL/HDL RATIO (CARDIO IQ ADV LIPID PANEL): 3.3 calc (ref <=5.0)
HDL Cholesterol: 40 mg/dL — ABNORMAL LOW (ref >=46)
LDL CHOLESTEROL CALCULATED (CARDIO IQ ADV LIPID PANEL): 78 mg/dL
LDL Large: 3350 nmol/L — ABNORMAL LOW (ref 5038–17886)
LDL Medium: 241 nmol/L (ref 121–397)
LDL PARTICLE NUMBER: 1109 nmol/L (ref 1016–2185)
LDL PEAK SIZE: 215.6 Angstrom — AB (ref >=218.2)
LDL Small: 217 nmol/L (ref 115–386)
Lipoprotein (a): 10 nmol/L (ref <75)
NON-HDL CHOLESTEROL (CARDIO IQ ADV LIPID PANEL): 92 mg/dL
TRIGLYCERIDES (CARDIO IQ ADV LIPID PANEL): 72 mg/dL

## 2016-02-16 NOTE — Telephone Encounter (Signed)
Please advise, thanks.

## 2016-02-18 NOTE — Telephone Encounter (Signed)
OK to fill this prescription with additional refills x3 Thank you!  

## 2016-02-18 NOTE — Telephone Encounter (Signed)
Called [facility] had to leave refill on pharmacy vm left msg authorization for clonazepam.../lmb

## 2016-02-18 NOTE — Telephone Encounter (Signed)
Pharmacist left msg on triage requesting status on clonazepam refill. Sent two request already is this ok to be refill...Sarah Escobar

## 2016-02-22 ENCOUNTER — Ambulatory Visit: Payer: BLUE CROSS/BLUE SHIELD | Admitting: Adult Health

## 2016-02-23 ENCOUNTER — Telehealth: Payer: Self-pay | Admitting: Internal Medicine

## 2016-02-23 NOTE — Telephone Encounter (Signed)
Folllow Up   Pt returned call

## 2016-02-24 NOTE — Telephone Encounter (Signed)
F/u  Pt returning Rn phone call. Please call back and discuss.   

## 2016-02-24 NOTE — Telephone Encounter (Signed)
Informed patient. She has been taking 10 mg simvastatin and will increase to 20 mg. Requests we check LFTs with next lipid panel.

## 2016-02-28 ENCOUNTER — Ambulatory Visit (AMBULATORY_SURGERY_CENTER): Payer: Self-pay | Admitting: *Deleted

## 2016-02-28 VITALS — Ht 66.0 in | Wt 141.6 lb

## 2016-02-28 DIAGNOSIS — Z86010 Personal history of colonic polyps: Secondary | ICD-10-CM

## 2016-02-28 NOTE — Progress Notes (Signed)
No egg or soy allergy known to patient  No issues with past sedation with any surgeries  or procedures, no intubation problems  No diet pills per patient No home 02 use per patient  No blood thinners per patient  Pt denies issues with constipation  emmi declined

## 2016-03-13 ENCOUNTER — Encounter: Payer: Self-pay | Admitting: Internal Medicine

## 2016-03-13 ENCOUNTER — Ambulatory Visit (AMBULATORY_SURGERY_CENTER): Payer: BLUE CROSS/BLUE SHIELD | Admitting: Internal Medicine

## 2016-03-13 VITALS — BP 130/76 | HR 63 | Temp 96.8°F | Resp 19 | Ht 66.0 in | Wt 141.0 lb

## 2016-03-13 DIAGNOSIS — K5730 Diverticulosis of large intestine without perforation or abscess without bleeding: Secondary | ICD-10-CM | POA: Diagnosis not present

## 2016-03-13 DIAGNOSIS — Z86010 Personal history of colonic polyps: Secondary | ICD-10-CM | POA: Diagnosis not present

## 2016-03-13 MED ORDER — SODIUM CHLORIDE 0.9 % IV SOLN: 500.0000 mL | INTRAVENOUS | Status: DC

## 2016-03-13 NOTE — Patient Instructions (Addendum)
No polyps today! You do still have diverticulosis - thickened muscle rings and pouches in the colon wall. Please read the handout about this condition.   Your next routine colonoscopy should be in 5 years - 2022.  I appreciate the opportunity to care for you. Gatha Mayer, MD, FACG   YOU HAD AN ENDOSCOPIC PROCEDURE TODAY AT [facility]:   Refer to the procedure report that was given to you for any specific questions about what was found during the examination.  If the procedure report does not answer your questions, please call your gastroenterologist to clarify.  If you requested that your care partner not be given the details of your procedure findings, then the procedure report has been included in a sealed envelope for you to review at your convenience later.  YOU SHOULD EXPECT: Some feelings of bloating in the abdomen. Passage of more gas than usual.  Walking can help get rid of the air that was put into your GI tract during the procedure and reduce the bloating. If you had a lower endoscopy (such as a colonoscopy or flexible sigmoidoscopy) you may notice spotting of blood in your stool or on the toilet paper. If you underwent a bowel prep for your procedure, you may not have a normal bowel movement for a few days.  Please Note:  You might notice some irritation and congestion in your nose or some drainage.  This is from the oxygen used during your procedure.  There is no need for concern and it should clear up in a day or so.  SYMPTOMS TO REPORT IMMEDIATELY:   Following lower endoscopy (colonoscopy or flexible sigmoidoscopy):  Excessive amounts of blood in the stool  Significant tenderness or worsening of abdominal pains  Swelling of the abdomen that is new, acute  Fever of 100F or higher   For urgent or emergent issues, a gastroenterologist can be reached at any hour by calling [phone].   DIET: Your first meal following the procedure should be  a small meal and then it is ok to progress to your normal diet. Heavy or fried foods are harder to digest and may make you feel nauseous or bloated.  Likewise, meals heavy in dairy and vegetables can increase bloating.  Drink plenty of fluids but you should avoid alcoholic beverages for 24 hours.  ACTIVITY:  You should plan to take it easy for the rest of today and you should NOT DRIVE or use heavy machinery until tomorrow (because of the sedation medicines used during the test).    FOLLOW UP: Our staff will call the number listed on your records the next business day following your procedure to check on you and address any questions or concerns that you may have regarding the information given to you following your procedure. If we do not reach you, we will leave a message.  However, if you are feeling well and you are not experiencing any problems, there is no need to return our call.  We will assume that you have returned to your regular daily activities without incident.  Thank-you for choosing [place] for your medical care today. If any biopsies were taken you will be contacted by phone or by letter within the next 1-3 weeks.  Please call us at [phone] if you have not heard about the biopsies in 3 weeks.    SIGNATURES/CONFIDENTIALITY: You and/or your care partner have signed paperwork which will be entered into your electronic medical record.  These signatures attest to the fact that that the information above on your After Visit Summary has been reviewed and is understood.  Full responsibility of the confidentiality of this discharge information lies with you and/or your care-partner.

## 2016-03-13 NOTE — Progress Notes (Signed)
Patient denies allergies to eggs or soy.  

## 2016-03-13 NOTE — Progress Notes (Signed)
Report to PACU, RN, vss, BBS= Clear.  

## 2016-03-13 NOTE — Op Note (Signed)
[place] Patient Name: Sarah Escobar Procedure Date: 03/13/2016 9:36 AM MRN: [id number] Endoscopist: Gatha Mayer , MD Age: 60 Date of Birth: Aug 23, 1956 Gender: Female Procedure:                Colonoscopy Indications:              Surveillance: Personal history of adenomatous                            polyps on last colonoscopy > 3 years ago Medicines:                Monitored Anesthesia Care Procedure:                Pre-Anesthesia Assessment:                           - Prior to the procedure, a History and Physical                            was performed, and patient medications and                            allergies were reviewed. The patient's tolerance of                            previous anesthesia was also reviewed. The risks                            and benefits of the procedure and the sedation                            options and risks were discussed with the patient.                            All questions were answered, and informed consent                            was obtained. Prior Anticoagulants: The patient has                            taken no previous anticoagulant or antiplatelet                            agents. ASA Grade Assessment: II - A patient with                            mild systemic disease. After reviewing the risks                            and benefits, the patient was deemed in                            satisfactory condition to undergo the procedure.  After obtaining informed consent, the colonoscope                            was passed under direct vision. Throughout the                            procedure, the patient's blood pressure, pulse, and                            oxygen saturations were monitored continuously. The                            Model PCF-H190L [id number]) scope was introduced                            through the anus and advanced to the the cecum,                identified by appendiceal orifice and ileocecal                            valve. The patient tolerated the procedure well.                            The colonoscopy was somewhat difficult due to a                            tortuous colon. The quality of the bowel                            preparation was good. The bowel preparation used                            was Miralax. The ileocecal valve, appendiceal                            orifice, and rectum were photographed. Scope In: 9:48:19 AM Scope Out: 10:08:20 AM Scope Withdrawal Time: 0 hours 15 minutes 48 seconds  Total Procedure Duration: 0 hours 20 minutes 1 second  Findings:                 The perianal and digital rectal examinations were                            normal.                           Many diverticula were found in the sigmoid colon.                            There was no evidence of diverticular bleeding.                           The exam was otherwise without abnormality on  direct and retroflexion views. Complications:            No immediate complications. Estimated Blood Loss:     Estimated blood loss: none. Impression:               - Severe diverticulosis in the sigmoid colon. There                            was no evidence of diverticular bleeding.                           - The examination was otherwise normal on direct                            and retroflexion views.                           - No specimens collected. Recommendation:           - Patient has a contact number available for                            emergencies. The signs and symptoms of potential                            delayed complications were discussed with the                            patient. Return to normal activities tomorrow.                            Written discharge instructions were provided to the                            patient.                           - Resume  previous diet.                           - Continue present medications.                           - Repeat colonoscopy in 5 years for surveillance                            (hx adenomas) and because of family history of                            colon cancer in sister and father Gatha Mayer, MD 03/13/2016 10:15:08 AM This report has been signed electronically.

## 2016-03-14 ENCOUNTER — Telehealth: Payer: Self-pay

## 2016-03-14 NOTE — Telephone Encounter (Signed)
Left a message for the pt to call us back if any questions or concerns at [phone]. maw

## 2016-03-20 ENCOUNTER — Other Ambulatory Visit: Payer: Self-pay | Admitting: Internal Medicine

## 2016-03-24 ENCOUNTER — Ambulatory Visit (INDEPENDENT_AMBULATORY_CARE_PROVIDER_SITE_OTHER)
Admission: RE | Admit: 2016-03-24 | Discharge: 2016-03-24 | Disposition: A | Discharge status: Home / Self Care | Payer: BLUE CROSS/BLUE SHIELD | Source: Ambulatory Visit | Attending: Adult Health | Admitting: Adult Health

## 2016-03-24 ENCOUNTER — Ambulatory Visit (INDEPENDENT_AMBULATORY_CARE_PROVIDER_SITE_OTHER): Payer: BLUE CROSS/BLUE SHIELD | Admitting: Adult Health

## 2016-03-24 ENCOUNTER — Encounter: Payer: Self-pay | Admitting: Adult Health

## 2016-03-24 VITALS — BP 114/82 | HR 70 | Temp 98.2°F | Ht 66.0 in | Wt 139.0 lb

## 2016-03-24 DIAGNOSIS — J449 Chronic obstructive pulmonary disease, unspecified: Secondary | ICD-10-CM | POA: Diagnosis not present

## 2016-03-24 DIAGNOSIS — J4531 Mild persistent asthma with (acute) exacerbation: Secondary | ICD-10-CM

## 2016-03-24 DIAGNOSIS — J479 Bronchiectasis, uncomplicated: Secondary | ICD-10-CM

## 2016-03-24 MED ORDER — AZITHROMYCIN 250 MG PO TABS
ORAL_TABLET | ORAL | Status: AC
Start: 2016-03-24 — End: 2016-03-29

## 2016-03-24 MED ORDER — PREDNISONE 10 MG PO TABS: ORAL_TABLET | ORAL | Status: DC

## 2016-03-24 NOTE — Patient Instructions (Addendum)
Prednisone taper over next week.  Zpack -to have on hold if symptoms worsen with discolored mucus.  Chest xray today .  Mucinex DM Twice daily  As needed  Cough  Saline nasal rinses As needed   Continue on BREO 1 puff daily -rinse after use.  follow up Dr. Elsworth Soho  In 6 months and As needed   Please contact office for sooner follow up if symptoms do not improve or worsen or seek emergency care

## 2016-03-24 NOTE — Assessment & Plan Note (Addendum)
Mild flare , check cxr  Plan  Prednisone taper over next week.  Zpack -to have on hold if symptoms worsen with discolored mucus.  Chest xray today .  Mucinex DM Twice daily  As needed  Cough  Saline nasal rinses As needed   Continue on BREO 1 puff daily -rinse after use.  follow up Dr. Elsworth Soho  In 6 months and As needed   Please contact office for sooner follow up if symptoms do not improve or worsen or seek emergency care

## 2016-03-24 NOTE — Assessment & Plan Note (Signed)
Check cxr .   

## 2016-03-24 NOTE — Progress Notes (Signed)
   Subjective:    Patient ID: Sarah Escobar, female    DOB: [birth year], 60 y.o.   MRN: [id number]  HPI 60 year old female former smoker with asthmatic bronchitis and bronchiectasis  Significant tests/ events  PFT 04/2014 - no obstruction, preserved lung volumes, DLCO 76%  CXR on 3/5 and 02/12/14 showed bilateral interstitial infiltrates-right more than left, which were persistent. HRCT 02/2014- Nonspecific pattern of interstitial lung disease with bronchiectasis, subpleural reticulation and architectural distortion. There are minor areas of alveolitis in the left upper and lower lobes Serology neg  hypersens panel neg  02/2015 CT chest that showed subpleural bronchiectasis, interstitial and patchy groundglass basilar predominance. No progression noted/stable changes Previous left upper and left lower groundglass has resolved. Lack of progression and lack of honeycombing and areas of central parenchymal involvement argue against usual interstitial pneumonitis.  07/2015 CT sinuses neg    03/24/2016 Follow up : Bronchiectasis /ILD  Pt returns for a a 6 month follow up. Pt c/o SOB with activity, prod cough with clear mucus, sinus congestion/drainage, chest congestion/tightness over last week. .  Denies wheezing, fever, nausea or vomiting.  Remains on BREO daily . PVX and Prevnar are utd.  Spirometry was stable last ov with FVC slightly decreased at 71% (prev 75%) Does have some nasal stuffiness, no discolored mucus.   Review of Systems  Constitutional:   No  weight loss, night sweats,  Fevers, chills,  +fatigue, or  lassitude.  HEENT:   No headaches,  Difficulty swallowing,  Tooth/dental problems, or  Sore throat,                No sneezing, itching, ear ache, nasal congestion, post nasal drip,   CV:  No chest pain,  Orthopnea, PND, swelling in lower extremities, anasarca, dizziness, palpitations, syncope.   GI  No heartburn, indigestion, abdominal pain, nausea, vomiting,  diarrhea, change in bowel habits, loss of appetite, bloody stools.   Resp:    No chest wall deformity  Skin: no rash or lesions.  GU: no dysuria, change in color of urine, no urgency or frequency.  No flank pain, no hematuria   MS:  No joint pain or swelling.  No decreased range of motion.  No back pain.  Psych:  No change in mood or affect. No depression or anxiety.  No memory loss.          Objective:   Physical Exam Filed Vitals:   03/24/16 1536  BP: 114/82  Pulse: 70  Temp: 98.2 F (36.8 C)  TempSrc: Oral  Height: 5\' 6"  (1.676 m)  Weight: 139 lb (63.05 kg)  SpO2: 98%    GEN: A/Ox3; pleasant , NAD   HEENT:  [place]/AT,  EACs-clear, TMs-wnl, NOSE-clear, THROAT-clear, no lesions, no postnasal drip or exudate noted.   NECK:  Supple w/ fair ROM; no JVD; normal carotid impulses w/o bruits; no thyromegaly or nodules palpated; no lymphadenopathy.  RESP  Few trace rhonchi no accessory muscle use, no dullness to percussion  CARD:  RRR, no m/r/g  , no peripheral edema, pulses intact, no cyanosis or clubbing.  GI:   Soft & nt; nml bowel sounds; no organomegaly or masses detected.  Musco: Warm bil, no deformities or joint swelling noted.   Neuro: alert, no focal deficits noted.    Skin: Warm, no lesions or rashes  Janin Kozlowski NP-C  [facility]  03/24/2016         Assessment & Plan:

## 2016-03-27 ENCOUNTER — Telehealth: Payer: Self-pay | Admitting: Adult Health

## 2016-03-27 NOTE — Telephone Encounter (Signed)
Chronic changes , no sign of PNA     Cont w/ ov recs     Please contact office for sooner follow up if symptoms do not improve or worsen or seek emergency care     Spoke with pt and notified of results per Rexene Edison, NP. Pt verbalized understanding and denied any questions.

## 2016-03-27 NOTE — Progress Notes (Signed)
Quick Note:  Spoke with the pt and notified of results and she verbalized understanding. ______

## 2016-03-27 NOTE — Progress Notes (Signed)
Quick Note:  LM with a female at the house number. I informed him to have her return our call. ______

## 2016-03-29 ENCOUNTER — Other Ambulatory Visit: Payer: Self-pay | Admitting: *Deleted

## 2016-03-29 DIAGNOSIS — E785 Hyperlipidemia, unspecified: Secondary | ICD-10-CM

## 2016-04-10 ENCOUNTER — Other Ambulatory Visit: Payer: BLUE CROSS/BLUE SHIELD

## 2016-04-12 ENCOUNTER — Encounter: Payer: Self-pay | Admitting: Internal Medicine

## 2016-04-12 ENCOUNTER — Other Ambulatory Visit: Payer: Self-pay | Admitting: Pulmonary Disease

## 2016-04-12 ENCOUNTER — Ambulatory Visit (INDEPENDENT_AMBULATORY_CARE_PROVIDER_SITE_OTHER): Payer: BLUE CROSS/BLUE SHIELD | Admitting: Internal Medicine

## 2016-04-12 VITALS — BP 110/72 | HR 77 | Wt 139.0 lb

## 2016-04-12 DIAGNOSIS — F329 Major depressive disorder, single episode, unspecified: Secondary | ICD-10-CM

## 2016-04-12 DIAGNOSIS — J4520 Mild intermittent asthma, uncomplicated: Secondary | ICD-10-CM | POA: Diagnosis not present

## 2016-04-12 DIAGNOSIS — R945 Abnormal results of liver function studies: Secondary | ICD-10-CM

## 2016-04-12 DIAGNOSIS — F32A Depression, unspecified: Secondary | ICD-10-CM

## 2016-04-12 DIAGNOSIS — R7989 Other specified abnormal findings of blood chemistry: Secondary | ICD-10-CM | POA: Diagnosis not present

## 2016-04-12 MED ORDER — ALBUTEROL SULFATE 108 (90 BASE) MCG/ACT IN AEPB: 1.0000 | INHALATION_SPRAY | Freq: Four times a day (QID) | RESPIRATORY_TRACT | Status: DC | PRN

## 2016-04-12 NOTE — Progress Notes (Signed)
Pre visit review using our clinic review tool, if applicable. No additional management support is needed unless otherwise documented below in the visit note. 

## 2016-04-12 NOTE — Assessment & Plan Note (Signed)
Breo Duoneb prn HHN Proair respiclick

## 2016-04-12 NOTE — Progress Notes (Signed)
Subjective:  Patient ID: Sarah Escobar, female    DOB: 1956-06-26  Age: 60 y.o. MRN: [id number]  CC: No chief complaint on file.   HPI Sarah Escobar presents for asthma, depression f/u F/u elev LFTs  Outpatient Prescriptions Prior to Visit  Medication Sig Dispense Refill  . aspirin EC 81 MG tablet Take 1 tablet (81 mg total) by mouth daily. 100 tablet 3  . Biotin 5000 MCG CAPS Take 2 capsules by mouth daily.    Marland Kitchen BREO ELLIPTA 100-25 MCG/INH AEPB USE 1 INHALATION DAILY 180 each 0  . buPROPion (WELLBUTRIN XL) 300 MG 24 hr tablet TAKE 1 TABLET EVERY MORNING 90 tablet 1  . Cholecalciferol 2000 units TABS Take 1 tablet by mouth daily.    . clonazePAM (KLONOPIN) 0.5 MG tablet TAKE 1 TABLET BY MOUTH AT BEDTIME AS NEEDED. 90 tablet 3  . Est Estrogens-Methyltest (ESTRATEST H.S. PO) Take by mouth daily.    . feeding supplement, ENSURE ENLIVE, (ENSURE ENLIVE) LIQD Take 237 mLs by mouth 2 (two) times daily between meals. 237 mL 12  . fexofenadine (ALLEGRA) 180 MG tablet Take 180 mg by mouth daily.    . Flaxseed, Linseed, (FLAXSEED OIL) 1000 MG CAPS Take by mouth 2 (two) times daily.    . fluticasone (FLONASE) 50 MCG/ACT nasal spray USE ONE SPRAY IN EACH NOSTRIL TWICE DAILY AS NEEDED 48 g 3  . GuaiFENesin (MUCINEX MAXIMUM STRENGTH PO) Take 2 tablets by mouth daily.    Marland Kitchen ipratropium-albuterol (DUONEB) 0.5-2.5 (3) MG/3ML SOLN Inhale 3 mLs into the lungs every 6 (six) hours as needed (Dx: J45.30). 360 mL 5  . metoprolol tartrate (LOPRESSOR) 25 MG tablet TAKE 1/2 TABLET BY MOUTH  TWICE DAILY 90 tablet 3  . Multiple Vitamins-Minerals (ONE-A-DAY 50 PLUS PO) Take by mouth daily.    . simvastatin (ZOCOR) 20 MG tablet Take 1 tablet (20 mg total) by mouth every evening. (Patient taking differently: Take 10 mg by mouth every evening. ) 90 tablet 3  . UNABLE TO FIND 1 scoop daily. Med Name: Collagen Peptides    . albuterol (PROVENTIL) (2.5 MG/3ML) 0.083% nebulizer solution INHALE CONTENTS OF ONE NEBULE VIA  NEBULIZER EVERY 4 HOURS AS NEEDED 300 mL 1  . albuterol (PROVENTIL) (2.5 MG/3ML) 0.083% nebulizer solution INHALE CONTENTS OF ONE NEBULE VIA NEBULIZER EVERY 4 HOURS AS NEEDED 300 mL 1  . albuterol-ipratropium (COMBIVENT) 18-103 MCG/ACT inhaler Inhale 2 puffs into the lungs every 6 (six) hours as needed for wheezing. (Patient not taking: Reported on 04/12/2016) 3 Inhaler 1  . predniSONE (DELTASONE) 10 MG tablet 4 tabs for 2 days, then 3 tabs for 2 days, 2 tabs for 2 days, then 1 tab for 2 days, then stop (Patient not taking: Reported on 04/12/2016) 20 tablet 0   No facility-administered medications prior to visit.    ROS Review of Systems  Constitutional: Negative for chills, activity change, appetite change, fatigue and unexpected weight change.  HENT: Negative for congestion, mouth sores and sinus pressure.   Eyes: Negative for visual disturbance.  Respiratory: Negative for cough and chest tightness.   Gastrointestinal: Negative for nausea and abdominal pain.  Genitourinary: Negative for frequency, difficulty urinating and vaginal pain.  Musculoskeletal: Negative for back pain and gait problem.  Skin: Negative for pallor and rash.  Neurological: Negative for dizziness, tremors, weakness, numbness and headaches.  Psychiatric/Behavioral: Negative for confusion and sleep disturbance.    Objective:  BP 110/72 mmHg  Pulse 77  Wt 139 lb (63.05  kg)  SpO2 95%  BP Readings from Last 3 Encounters:  04/12/16 110/72  03/24/16 114/82  03/13/16 130/76    Wt Readings from Last 3 Encounters:  04/12/16 139 lb (63.05 kg)  03/24/16 139 lb (63.05 kg)  03/13/16 141 lb (63.957 kg)    Physical Exam  Constitutional: She appears well-developed. No distress.  HENT:  Head: Normocephalic.  Right Ear: External ear normal.  Left Ear: External ear normal.  Nose: Nose normal.  Mouth/Throat: Oropharynx is clear and moist.  Eyes: Conjunctivae are normal. Pupils are equal, round, and reactive to light.  Right eye exhibits no discharge. Left eye exhibits no discharge.  Neck: Normal range of motion. Neck supple. No JVD present. No tracheal deviation present. No thyromegaly present.  Cardiovascular: Normal rate, regular rhythm and normal heart sounds.   Pulmonary/Chest: No stridor. No respiratory distress. She has wheezes.  Abdominal: Soft. Bowel sounds are normal. She exhibits no distension and no mass. There is no tenderness. There is no rebound and no guarding.  Musculoskeletal: She exhibits no edema or tenderness.  Lymphadenopathy:    She has no cervical adenopathy.  Neurological: She displays normal reflexes. No cranial nerve deficit. She exhibits normal muscle tone. Coordination normal.  Skin: No rash noted. No erythema.  Psychiatric: She has a normal mood and affect. Her behavior is normal. Judgment and thought content normal.   ..I personally provided Proair respiclick inhaler use teaching. After the teaching patient was able to demonstrate it's use effectively. All questions were answered  Lab Results  Component Value Date   WBC 9.1 11/05/2015   HGB 14.9 11/05/2015   HCT 44.5 11/05/2015   PLT 224.0 11/05/2015   GLUCOSE 87 11/05/2015   CHOL 132 02/10/2016   TRIG 72 02/10/2016   HDL 40* 02/10/2016   LDLCALC 78 02/10/2016   ALT 34 01/10/2016   AST 33 01/10/2016   NA 138 11/05/2015   K 4.2 11/05/2015   CL 103 11/05/2015   CREATININE 0.89 11/05/2015   BUN 11 11/05/2015   CO2 29 11/05/2015   TSH 2.04 11/05/2015   INR 1.0 01/10/2016    Dg Chest 2 View  03/24/2016  CLINICAL DATA:  Sinus congestion, cough for 2 days. EXAM: CHEST  2 VIEW COMPARISON:  05/10/2015 FINDINGS: Stable chronic peribronchial thickening and interstitial prominence, likely chronic bronchitis. Heart is normal size. No confluent opacities or effusions. No acute bony abnormality. IMPRESSION: Stable chronic bronchitic changes. Electronically Signed   By: Rolm Baptise M.D.   On: 03/24/2016 16:37    Assessment &  Plan:   Diagnoses and all orders for this visit:  Asthmatic bronchitis, mild intermittent, uncomplicated  Other orders -     Discontinue: Albuterol Sulfate (PROAIR RESPICLICK) 123XX123 (90 Base) MCG/ACT AEPB; Inhale 1-2 puffs into the lungs 4 (four) times daily as needed. -     Albuterol Sulfate (PROAIR RESPICLICK) 123XX123 (90 Base) MCG/ACT AEPB; Inhale 1-2 puffs into the lungs 4 (four) times daily as needed.  I have discontinued Ms. Waugh's albuterol, albuterol, and predniSONE. I am also having her maintain her Est Estrogens-Methyltest (ESTRATEST H.S. PO), fexofenadine, Flaxseed Oil, Multiple Vitamins-Minerals (ONE-A-DAY 50 PLUS PO), Biotin, feeding supplement (ENSURE ENLIVE), ipratropium-albuterol, UNABLE TO FIND, albuterol-ipratropium, simvastatin, metoprolol tartrate, buPROPion, Cholecalciferol, GuaiFENesin (MUCINEX MAXIMUM STRENGTH PO), fluticasone, aspirin EC, clonazePAM, BREO ELLIPTA, and Albuterol Sulfate.  Meds ordered this encounter  Medications  . DISCONTD: Albuterol Sulfate (PROAIR RESPICLICK) 123XX123 (90 Base) MCG/ACT AEPB    Sig: Inhale 1-2 puffs into the lungs 4 (  four) times daily as needed.    Dispense:  1 each    Refill:  11  . Albuterol Sulfate (PROAIR RESPICLICK) 123XX123 (90 Base) MCG/ACT AEPB    Sig: Inhale 1-2 puffs into the lungs 4 (four) times daily as needed.    Dispense:  3 each    Refill:  3     Follow-up: Return in about 4 months (around 08/13/2016) for a follow-up visit.  Walker Kehr, MD

## 2016-04-18 NOTE — Assessment & Plan Note (Signed)
-  

## 2016-04-18 NOTE — Assessment & Plan Note (Signed)
Monitoring LFTs

## 2016-04-20 ENCOUNTER — Other Ambulatory Visit: Payer: BLUE CROSS/BLUE SHIELD

## 2016-04-26 ENCOUNTER — Other Ambulatory Visit (INDEPENDENT_AMBULATORY_CARE_PROVIDER_SITE_OTHER): Payer: BLUE CROSS/BLUE SHIELD | Admitting: *Deleted

## 2016-04-26 DIAGNOSIS — E785 Hyperlipidemia, unspecified: Secondary | ICD-10-CM | POA: Diagnosis not present

## 2016-04-26 LAB — HEPATIC FUNCTION PANEL
ALBUMIN: 4.1 g/dL (ref 3.6–5.1)
ALT: 33 U/L — ABNORMAL HIGH (ref 6–29)
AST: 28 U/L (ref 10–35)
Alkaline Phosphatase: 92 U/L (ref 33–130)
BILIRUBIN TOTAL: 0.4 mg/dL (ref 0.2–1.2)
Bilirubin, Direct: 0.1 mg/dL (ref <=0.2)
Indirect Bilirubin: 0.3 mg/dL (ref 0.2–1.2)
Total Protein: 6.6 g/dL (ref 6.1–8.1)

## 2016-05-01 LAB — CARDIO IQ(R) ADVANCED LIPID PANEL
Apolipoprotein B: 65 mg/dL (ref 49–103)
CHOLESTEROL, TOTAL (CARDIO IQ ADV LIPID PANEL): 121 mg/dL — AB (ref 125–200)
CHOLESTEROL/HDL RATIO (CARDIO IQ ADV LIPID PANEL): 2.9 calc (ref <=5.0)
HDL CHOLESTEROL (CARDIO IQ ADV LIPID PANEL): 42 mg/dL — AB (ref >=46)
LDL Large: 4826 nmol/L — ABNORMAL LOW (ref 5038–17886)
LDL Medium: 226 nmol/L (ref 121–397)
LDL PARTICLE NUMBER: 1193 nmol/L (ref 1016–2185)
LDL PEAK SIZE: 218.1 Angstrom — AB (ref >=218.2)
LDL Small: 195 nmol/L (ref 115–386)
LDL, Calculated: 67 mg/dL
Lipoprotein (a): 20 nmol/L (ref <75)
NON-HDL CHOLESTEROL (CARDIO IQ ADV LIPID PANEL): 79 mg/dL
TRIGLYCERIDES (CARDIO IQ ADV LIPID PANEL): 62 mg/dL

## 2016-05-09 ENCOUNTER — Telehealth: Payer: Self-pay | Admitting: Internal Medicine

## 2016-05-09 MED ORDER — SIMVASTATIN 20 MG PO TABS: 20.0000 mg | ORAL_TABLET | Freq: Every day | ORAL | Status: DC

## 2016-05-09 NOTE — Telephone Encounter (Signed)
Sarah Escobar is calling to get her lab results from 04/26/16 . Please call   Thanks

## 2016-05-09 NOTE — Telephone Encounter (Signed)
Patient informed of results and verbalizes understanding. She is taking a whole pill (20 mg) of Zocor. She will continue with this dose.

## 2016-05-25 ENCOUNTER — Other Ambulatory Visit: Payer: Self-pay | Admitting: Pulmonary Disease

## 2016-05-28 ENCOUNTER — Other Ambulatory Visit: Payer: Self-pay | Admitting: Internal Medicine

## 2016-06-28 ENCOUNTER — Other Ambulatory Visit: Payer: Self-pay | Admitting: Internal Medicine

## 2016-07-29 ENCOUNTER — Other Ambulatory Visit: Payer: Self-pay | Admitting: Pulmonary Disease

## 2016-08-01 ENCOUNTER — Other Ambulatory Visit: Payer: Self-pay | Admitting: Internal Medicine

## 2016-08-04 ENCOUNTER — Other Ambulatory Visit: Payer: Self-pay | Admitting: *Deleted

## 2016-08-04 MED ORDER — METOPROLOL TARTRATE 25 MG PO TABS: ORAL_TABLET | ORAL | 0 refills | Status: DC

## 2016-09-06 ENCOUNTER — Other Ambulatory Visit: Payer: Self-pay | Admitting: *Deleted

## 2016-09-06 MED ORDER — FLUTICASONE FUROATE-VILANTEROL 100-25 MCG/INH IN AEPB: INHALATION_SPRAY | RESPIRATORY_TRACT | 0 refills | Status: DC

## 2016-09-06 NOTE — Telephone Encounter (Signed)
Refill request received for Breo. RX sent to pharmacy nothing else needed.

## 2016-09-12 ENCOUNTER — Ambulatory Visit (INDEPENDENT_AMBULATORY_CARE_PROVIDER_SITE_OTHER): Payer: BLUE CROSS/BLUE SHIELD | Admitting: Pulmonary Disease

## 2016-09-12 ENCOUNTER — Encounter: Payer: Self-pay | Admitting: Pulmonary Disease

## 2016-09-12 DIAGNOSIS — J479 Bronchiectasis, uncomplicated: Secondary | ICD-10-CM

## 2016-09-12 DIAGNOSIS — J4530 Mild persistent asthma, uncomplicated: Secondary | ICD-10-CM

## 2016-09-12 MED ORDER — FLUTICASONE FUROATE-VILANTEROL 100-25 MCG/INH IN AEPB: INHALATION_SPRAY | RESPIRATORY_TRACT | 0 refills | Status: DC

## 2016-09-12 NOTE — Patient Instructions (Signed)
Stay on [place] to use albuterol nebs as needed Nettie pot to clear sinuses  Call as needed-lung function seems to be maintained

## 2016-09-12 NOTE — Assessment & Plan Note (Signed)
Stay on [place] to use albuterol nebs as needed Nettie pot to clear sinuses

## 2016-09-12 NOTE — Progress Notes (Signed)
   Subjective:    Patient ID: Sarah Escobar, female    DOB: 03/30/1956, 60 y.o.   MRN: [id number]  HPI  60 year old ex-smoker with asthmatic bronchitis & bronchiectasis, hypersensitive to perfumes. She works in Geologist, engineering at [employer].  She smoked 1 PPD for about 30-pack-years before quitting in 2003. She was given a diagnosis of asthma for more than 5 years and maintained on dulera and Spiriva. She developed sinusitis and bronchitis symptoms in January, requiring to urgent care visits and required 2 rounds of antibiotics and steroids.  Reports repeated sinus infections, had multiple sinus surgeries -saw ENT  09/12/2016  Chief Complaint  Patient presents with  . Follow-up    Pt states breathing is good, A little congestion, tightness had to use inhaler, Coughing up a yellow mucus, Pt wants to discuss BREO prescription.    She had a better year no flares, given prednisone taper on last visit 02/2016 Bronchoscopy has been deferred in the past due to cost, was never able to obtain sputum AFB  She continues to be compliant with Nettie pot rinses Still needs albuterol nebs almost daily She wonders if breo is causing hair loss-has seen dermatology for this  She denies any chest pain, orthopnea, PND, hemoptysis, orthopnea, PND or leg swelling.    Significant tests/ events  PFT 04/2014 - no obstruction, preserved lung volumes, DLCO 76%  CXR on 3/5 and 02/12/14 showed bilateral interstitial infiltrates-right more than left, which were persistent. HRCT 02/2014- Nonspecific pattern of interstitial lung disease with bronchiectasis, subpleural reticulation and architectural distortion. There are minor areas of alveolitis in the left upper and lower lobes Serology neg  hypersens panel neg  02/2015  CT chest  that showed subpleural bronchiectasis, interstitial and patchy groundglass basilar predominance. No progression noted/stable changes Previous left upper and left lower  groundglass has resolved. Lack of progression and lack of honeycombing and areas of central parenchymal involvement argue against usual interstitial pneumonitis.  07/2015 CT sinuses neg  09/2015 Spirometry >> ratio 90, FEV 81%, FVC 71%   Review of Systems neg for any significant sore throat, dysphagia, itching, sneezing, nasal congestion or excess/ purulent secretions, fever, chills, sweats, unintended wt loss, pleuritic or exertional cp, hempoptysis, orthopnea pnd or change in chronic leg swelling.   Also denies presyncope, palpitations, heartburn, abdominal pain, nausea, vomiting, diarrhea or change in bowel or urinary habits, dysuria,hematuria, rash, arthralgias, visual complaints, headache, numbness weakness or ataxia.     Objective:   Physical Exam   Gen. Pleasant, well-nourished, in no distress ENT - no lesions, no post nasal drip Neck: No JVD, no thyromegaly, no carotid bruits Lungs: no use of accessory muscles, no dullness to percussion, clear without rales or rhonchi  Cardiovascular: Rhythm regular, heart sounds  normal, no murmurs or gallops, no peripheral edema Musculoskeletal: No deformities, no cyanosis or clubbing         Assessment & Plan:

## 2016-09-12 NOTE — Assessment & Plan Note (Signed)
Call as needed- we discussed early signs of bronchitis  lung function seems to be maintained

## 2016-09-13 ENCOUNTER — Ambulatory Visit: Payer: BLUE CROSS/BLUE SHIELD | Admitting: Pulmonary Disease

## 2016-09-19 ENCOUNTER — Other Ambulatory Visit: Payer: Self-pay | Admitting: Internal Medicine

## 2016-09-20 NOTE — Telephone Encounter (Signed)
 controlled substance database checked.  Ok to fill medication - 30 day supply -  printed

## 2016-09-20 NOTE — Telephone Encounter (Signed)
Ok to Rf in PCP's absence?  Last OV was 04/12/16. No existing appts. Thanks!

## 2016-09-26 ENCOUNTER — Other Ambulatory Visit: Payer: Self-pay | Admitting: Internal Medicine

## 2016-09-27 NOTE — Telephone Encounter (Signed)
Done

## 2016-10-09 ENCOUNTER — Other Ambulatory Visit: Payer: Self-pay | Admitting: Internal Medicine

## 2016-10-17 ENCOUNTER — Other Ambulatory Visit: Payer: Self-pay | Admitting: Internal Medicine

## 2016-10-24 ENCOUNTER — Telehealth: Payer: Self-pay | Admitting: Pulmonary Disease

## 2016-10-24 MED ORDER — DOXYCYCLINE HYCLATE 100 MG PO TABS: 100.0000 mg | ORAL_TABLET | Freq: Two times a day (BID) | ORAL | 0 refills | Status: DC

## 2016-10-24 MED ORDER — PREDNISONE 10 MG PO TABS: ORAL_TABLET | ORAL | 0 refills | Status: DC

## 2016-10-24 NOTE — Telephone Encounter (Signed)
Spoke with pt. She is aware of TP's recommendations. Rxs have been sent in. Nothing further was needed.

## 2016-10-24 NOTE — Telephone Encounter (Signed)
Called and spoke with pt and she stated that she has been fighting off the cough with yellow/green sputum, congestion, sinus pressure, achy/fatigue feelings.  She is requesting doxy and prednisone.  RA is not available.  Will forward to TP for recs. Please advise. Pt was last seen by RA on 09/12/16  Allergies  Allergen Reactions  . Flagyl [Metronidazole] Other (See Comments)    Nerve pain  . Levofloxacin     rash  . Loperamide Hcl     rash  . Neomycin-Bacitracin Zn-Polymyx     rash  . Ofloxacin     rash  . Sulfonamide Derivatives     hives  . Band-Aid Liquid Bandage [New Skin]     Regular band-aid cause itching and redness  . Gabapentin     Mental status change  . Latex Other (See Comments)    "redness"  . Oxycontin [Oxycodone Hcl] Rash    Rash

## 2016-10-24 NOTE — Telephone Encounter (Signed)
That is fine  Please contact office for sooner follow up if symptoms do not improve or worsen or seek emergency care  Doxycycline 100mg  #14 , 1 po Twice daily  , no refills  Prednisone 10mg  #20  4 tabs for 2 days, then 3 tabs for 2 days, 2 tabs for 2 days, then 1 tab for 2 days, then stop No refills.  If not getting better needs ov

## 2016-11-08 ENCOUNTER — Other Ambulatory Visit: Payer: Self-pay | Admitting: Pulmonary Disease

## 2016-11-09 ENCOUNTER — Other Ambulatory Visit: Payer: Self-pay | Admitting: *Deleted

## 2016-11-09 MED ORDER — METOPROLOL TARTRATE 25 MG PO TABS: ORAL_TABLET | ORAL | 0 refills | Status: DC

## 2016-11-09 MED ORDER — FLUTICASONE PROPIONATE 50 MCG/ACT NA SUSP: NASAL | 0 refills | Status: DC

## 2016-11-09 NOTE — Telephone Encounter (Signed)
Pt call back she is also needing her metoprolol. Inform will send to [facility]...Johny Chess

## 2016-11-09 NOTE — Addendum Note (Signed)
Addended by: Earnstine Regal on: 11/09/2016 10:19 AM   Modules accepted: Orders

## 2016-11-09 NOTE — Telephone Encounter (Signed)
Pt left msg on triage yesterday afternoon stating she has her CPX schedule in Feb "18", but she is needing refill on her flonase to be sent to [facility]. Called pt back inform refill has been sent...Johny Chess

## 2016-11-17 ENCOUNTER — Ambulatory Visit: Payer: BLUE CROSS/BLUE SHIELD | Admitting: Internal Medicine

## 2016-11-27 ENCOUNTER — Other Ambulatory Visit: Payer: Self-pay | Admitting: Internal Medicine

## 2016-12-14 ENCOUNTER — Encounter: Payer: Self-pay | Admitting: Internal Medicine

## 2016-12-14 ENCOUNTER — Ambulatory Visit (INDEPENDENT_AMBULATORY_CARE_PROVIDER_SITE_OTHER): Payer: BLUE CROSS/BLUE SHIELD | Admitting: Internal Medicine

## 2016-12-14 ENCOUNTER — Encounter (INDEPENDENT_AMBULATORY_CARE_PROVIDER_SITE_OTHER): Payer: Self-pay

## 2016-12-14 VITALS — BP 130/64 | HR 69 | Ht 66.0 in | Wt 137.0 lb

## 2016-12-14 DIAGNOSIS — E782 Mixed hyperlipidemia: Secondary | ICD-10-CM

## 2016-12-14 DIAGNOSIS — R945 Abnormal results of liver function studies: Secondary | ICD-10-CM

## 2016-12-14 DIAGNOSIS — R7989 Other specified abnormal findings of blood chemistry: Secondary | ICD-10-CM | POA: Diagnosis not present

## 2016-12-14 DIAGNOSIS — I10 Essential (primary) hypertension: Secondary | ICD-10-CM | POA: Diagnosis not present

## 2016-12-14 NOTE — Progress Notes (Signed)
Cardiology Office Note   Date:  12/14/2016   ID:  Sarah Escobar, DOB 23-Oct-1956, MRN [id number]  PCP:  Walker Kehr, MD  Cardiologist:   Dorris Carnes, MD   Pt prsents for F/U of CAD      History of Present Illness: Sarah Escobar is a 61 y.o. female with a history of asthamtic bronchitis and bronchiectasis   and coronary calcifications  I saw her 1 year ago    SInce seen she has done OK  No CP  Breathing is stable  Does have hair loss  Seen at [facility]       Current Meds  Medication Sig  . albuterol (PROVENTIL) (2.5 MG/3ML) 0.083% nebulizer solution INHALE CONTENTS OF ONE VIAL VIA NEBULIZER EVERY 4 HOURS AS NEEDED  . Albuterol Sulfate (PROAIR RESPICLICK) 123XX123 (90 Base) MCG/ACT AEPB Inhale 1-2 puffs into the lungs 4 (four) times daily as needed.  Marland Kitchen albuterol-ipratropium (COMBIVENT) 18-103 MCG/ACT inhaler Inhale 2 puffs into the lungs every 6 (six) hours as needed for wheezing.  Marland Kitchen aspirin EC 81 MG tablet Take 1 tablet (81 mg total) by mouth daily.  . Biotin 5000 MCG CAPS Take 2 capsules by mouth daily.  Marland Kitchen BREO ELLIPTA 100-25 MCG/INH AEPB USE 1 INHALATION DAILY  . buPROPion (WELLBUTRIN XL) 300 MG 24 hr tablet Take 1 tablet (300 mg total) by mouth every morning. Yearly physical w/labs due in Feb must see MD for refills  . Cholecalciferol 2000 units TABS Take 1 tablet by mouth daily.  . clonazePAM (KLONOPIN) 0.5 MG tablet TAKE 1 TABLET BY MOUTH AT BEDTIME AS NEEDED  . doxycycline (VIBRA-TABS) 100 MG tablet Take 1 tablet (100 mg total) by mouth 2 (two) times daily.  . Est Estrogens-Methyltest (ESTRATEST H.S. PO) Take by mouth daily.  . feeding supplement, ENSURE ENLIVE, (ENSURE ENLIVE) LIQD Take 237 mLs by mouth 2 (two) times daily between meals.  . fexofenadine (ALLEGRA) 180 MG tablet Take 180 mg by mouth daily.  . finasteride (PROSCAR) 5 MG tablet Take 5 mg by mouth daily.  . Flaxseed, Linseed, (FLAXSEED OIL) 1000 MG CAPS Take by mouth 2 (two) times daily.  . fluticasone (FLONASE) 50  MCG/ACT nasal spray USE ONE SPRAY IN EACH  NOSTRIL TWICE DAILY AS  NEEDED  . fluticasone furoate-vilanterol (BREO ELLIPTA) 100-25 MCG/INH AEPB USE 1 INHALATION DAILY  . GuaiFENesin (MUCINEX MAXIMUM STRENGTH PO) Take 2 tablets by mouth daily.  Marland Kitchen ipratropium-albuterol (DUONEB) 0.5-2.5 (3) MG/3ML SOLN INHALE 3 MLS INTO THE LUNGS EVERY 6 (SIX) HOURS AS NEEDED (DX: J45.30).  . metoprolol tartrate (LOPRESSOR) 25 MG tablet TAKE 1/2 TABLET BY MOUTH  TWICE DAILY  . minoxidil (ROGAINE) 2 % external solution Apply topically as directed.  . Multiple Vitamins-Minerals (ONE-A-DAY 50 PLUS PO) Take by mouth daily.  . simvastatin (ZOCOR) 20 MG tablet Take 1 tablet (20 mg total) by mouth daily at 6 PM.  . UNABLE TO FIND 1 scoop daily. Med Name: Collagen Peptides     Allergies:   Flagyl [metronidazole]; Levofloxacin; Loperamide hcl; Neomycin-bacitracin zn-polymyx; Ofloxacin; Sulfonamide derivatives; Band-aid liquid bandage [new skin]; Collodion; Gabapentin; Latex; Other; Loperamide; Oxycodone; and Oxycontin [oxycodone hcl]   Past Medical History:  Diagnosis Date  . Allergy   . Asthma    adult onset  . Depression   . Hyperlipidemia    LDL goal = < 100  . Hypertension   . Osteopenia    mild  . Personal history of adenomatous colonic polyps/FHx colon cancer sister and father  [name]  . Sciatica of left side    intermittent    Past Surgical History:  Procedure Laterality Date  . BUNIONECTOMY    . CERVICAL FUSION  1999   Dr Arnoldo Morale, [place]  . COLONOSCOPY  2013  . COLONOSCOPY W/ POLYPECTOMY  multiple   adenomas; [name]; last 2013  . discetomy lumbar  02/19/13   L1&2; Dr Arnoldo Morale, [place]  . NASAL SINUS SURGERY  [phone]   X 3  . POLYPECTOMY  1217--2013   TA+  . TOTAL ABDOMINAL HYSTERECTOMY W/ BILATERAL SALPINGOOPHORECTOMY  2003   endometriosis      Social History:  The patient  reports that she quit smoking about 15 years ago. Her smoking use included Cigarettes. She has a 30.00  pack-year smoking history. She has never used smokeless tobacco. She reports that she drinks alcohol. She reports that she does not use drugs.   Family History:  The patient's family history includes Anemia in her brother; Asthma in her maternal aunt, maternal grandmother, mother, and sister; Cirrhosis in her brother and mother; Colitis in her mother; Colon cancer in her father and sister; Colon polyps in her brother; Diabetes in her father; Sarah attack (age of onset: 50) in her sister; Sarah attack (age of onset: 67) in her father; Prostate cancer in her brother; Stroke (age of onset: 61) in her father.    ROS:  Please see the history of present illness. All other systems are reviewed and  Negative to the above problem except as noted.    PHYSICAL EXAM: VS:  BP 130/64   Pulse 69   Ht 5\' 6"  (1.676 m)   Wt 137 lb (62.1 kg)   BMI 22.11 kg/m   GEN: Well nourished, well developed, in no acute distress  HEENT: normal  Neck: no JVD, carotid bruits, or masses Cardiac: RRR; no murmurs, rubs, or gallops,no edema  Respiratory:  clear to auscultation bilaterally, normal work of breathing GI: soft, nontender, nondistended, + BS  No hepatomegaly  MS: no deformity Moving all extremities   Skin: warm and dry, no rash Neuro:  Strength and sensation are intact Psych: euthymic mood, full affect   EKG:  EKG is ordered today.  SR 69 bpm     Lipid Panel    Component Value Date/Time   CHOL 121 (L) 2020/03/3016 0801   TRIG 62 2020/03/3016 0801   HDL 42 (L) 2020/03/3016 0801   CHOLHDL 2.9 2020/03/3016 0801   CHOLHDL 3 11/05/2015 0756   VLDL 14.8 11/05/2015 0756   LDLCALC 67 2020/03/3016 0801      Wt Readings from Last 3 Encounters:  12/14/16 137 lb (62.1 kg)  09/12/16 137 lb 3.2 oz (62.2 kg)  04/12/16 139 lb (63 kg)      ASSESSMENT AND PLAN:  Pt doing good    1 CAD  Coronary calcifications   No symtoms to sugg angina   2  HL  Will get lipds  Ate breakfast  2  Pulm  Followed by [facility]   Encouraged walking and exercise  3  Hair  Will check TSH  Can stop metoprolol  I do not think at that dose it should affect BP much  Follow    F/U in 1 year    Current medicines are reviewed at length with the patient today.  The patient does not have concerns regarding medicines.  Signed, Dorris Carnes, MD  12/14/2016 8:28 AM    [facility]  [address], [place], [place]  [address] Phone: [phone]; Fax: [phone]

## 2016-12-14 NOTE — Patient Instructions (Signed)
Your physician has recommended you make the following change in your medication:  1.) stop metoprolol  Your physician recommends that you return for lab work today (CBC, BMET, TSH, LIPIDS)  Your physician wants you to follow-up in: [place].  You will receive a reminder letter in the mail two months in advance. If you don't receive a letter, please call our office to schedule the follow-up appointment.

## 2016-12-15 LAB — LIPID PANEL
CHOLESTEROL TOTAL: 109 mg/dL (ref 100–199)
Chol/HDL Ratio: 2.6 ratio units (ref 0.0–4.4)
HDL: 42 mg/dL (ref >39)
LDL CALC: 51 mg/dL (ref 0–99)
TRIGLYCERIDES: 80 mg/dL (ref 0–149)
VLDL Cholesterol Cal: 16 mg/dL (ref 5–40)

## 2016-12-15 LAB — COMPREHENSIVE METABOLIC PANEL
A/G RATIO: 1.7 (ref 1.2–2.2)
ALBUMIN: 4.1 g/dL (ref 3.6–4.8)
ALK PHOS: 92 IU/L (ref 39–117)
ALT: 31 IU/L (ref 0–32)
AST: 32 IU/L (ref 0–40)
BILIRUBIN TOTAL: 0.3 mg/dL (ref 0.0–1.2)
BUN / CREAT RATIO: 12 (ref 12–28)
BUN: 10 mg/dL (ref 8–27)
CHLORIDE: 94 mmol/L — AB (ref 96–106)
CO2: 24 mmol/L (ref 18–29)
Calcium: 9.4 mg/dL (ref 8.7–10.3)
Creatinine, Ser: 0.81 mg/dL (ref 0.57–1.00)
GFR calc Af Amer: 91 mL/min/{1.73_m2} (ref >59)
GFR calc non Af Amer: 79 mL/min/{1.73_m2} (ref >59)
GLOBULIN, TOTAL: 2.4 g/dL (ref 1.5–4.5)
Glucose: 65 mg/dL (ref 65–99)
POTASSIUM: 4.3 mmol/L (ref 3.5–5.2)
SODIUM: 134 mmol/L (ref 134–144)
Total Protein: 6.5 g/dL (ref 6.0–8.5)

## 2016-12-15 LAB — CBC
HEMATOCRIT: 40.7 % (ref 34.0–46.6)
Hemoglobin: 14.1 g/dL (ref 11.1–15.9)
MCH: 33.6 pg — AB (ref 26.6–33.0)
MCHC: 34.6 g/dL (ref 31.5–35.7)
MCV: 97 fL (ref 79–97)
PLATELETS: 257 10*3/uL (ref 150–379)
RBC: 4.2 x10E6/uL (ref 3.77–5.28)
RDW: 12.8 % (ref 12.3–15.4)
WBC: 8.9 10*3/uL (ref 3.4–10.8)

## 2016-12-15 LAB — TSH: TSH: 1.75 u[IU]/mL (ref 0.450–4.500)

## 2016-12-22 ENCOUNTER — Telehealth: Payer: Self-pay | Admitting: Pulmonary Disease

## 2016-12-22 ENCOUNTER — Telehealth: Payer: Self-pay | Admitting: Internal Medicine

## 2016-12-22 MED ORDER — CEPHALEXIN 500 MG PO CAPS: 500.0000 mg | ORAL_CAPSULE | Freq: Three times a day (TID) | ORAL | 0 refills | Status: DC

## 2016-12-22 MED ORDER — PREDNISONE 10 MG PO TABS: ORAL_TABLET | ORAL | 0 refills | Status: DC

## 2016-12-22 NOTE — Telephone Encounter (Signed)
Mrs. Fero is calling about test results please call

## 2016-12-22 NOTE — Telephone Encounter (Signed)
  Cephalexin 500mg  tid x 5 days (ensure not on allergy list) Take prednisone 40 mg daily x 2 days, then 20mg  daily x 2 days, then 10mg  daily x 2 days, then 5mg  daily x 2 days and stop   Allergies  Allergen Reactions  . Flagyl [Metronidazole] Other (See Comments)    Nerve pain Nerve pain  . Levofloxacin Rash    rash  . Loperamide Hcl     rash  . Neomycin-Bacitracin Zn-Polymyx Rash    rash  . Ofloxacin Rash    rash  . Sulfonamide Derivatives     hives  . Band-Aid Liquid Bandage [New Skin]     Regular band-aid cause itching and redness  . Collodion Dermatitis  . Gabapentin Other (See Comments)    Mental changes Mental status change  . Latex Other (See Comments) and Dermatitis    "redness"  . Other Hives    Sulfonamide derivatives, hives  . Loperamide Rash  . Oxycodone Rash  . Oxycontin [Oxycodone Hcl] Rash    Rash

## 2016-12-22 NOTE — Telephone Encounter (Signed)
Spoke with pt. She is aware of MR's recommendations. Rxs have been sent in. Nothing further was needed. 

## 2016-12-22 NOTE — Telephone Encounter (Signed)
Spoke with pt, who states she has a prod cough with yellow to brown mucus, chest discomfort, mild wheezing & chills X 4d Pt denies any fever or sweats.  Pt taking advil & mucinex daily with mild relief. Pt is requesting an Rx for prednisone and abx.  RM please advise in RA's absence. Thanks.

## 2016-12-25 NOTE — Telephone Encounter (Signed)
New message   Pt verbalized that she wants her test result and to   speak to the rn she did not want to disclose any information

## 2016-12-25 NOTE — Telephone Encounter (Signed)
Reviewed lab results with patient.  She wanted to also discuss that she has been off finasteride for 9 days now and the chest discomfort/breast soreness has improved at least 80 %.  She has pain between her shoulder blades that she didn't think to mention during the visit with MD.  It continues after stopping finasteride.   It is relieved by Advil.  She notices it is worse when coughing and turning over in bed at night (sharper).   Currently has some "respiratory thing" and has been prescribed antibiotics and prednisone.   Also, she went back on metoprolol 12.5 mg daily because when she stopped it, her HR went to over 100-110.  I advised pt it is unlikely the pain between her shoulder blades is cardiac, since it is relieved by Advil and worsens with turning over in bed.  She also has been coughing a lot with a respiratory illness.  I also advised when she is over this illness and finished with prednisone, she can take metoprolol every other day for a week and then stop to see if her HR will stay lower.  Pt is aware I am forwarding to Dr. Harrington Challenger for any further advisement and will call her back with any new information.  Asked her to call us back if symptoms continue.  She is appreciative for the information provided.

## 2016-12-26 NOTE — Telephone Encounter (Signed)
Agree with recommendations.  

## 2016-12-31 ENCOUNTER — Other Ambulatory Visit: Payer: Self-pay | Admitting: Internal Medicine

## 2017-01-11 ENCOUNTER — Encounter: Payer: Self-pay | Admitting: Internal Medicine

## 2017-01-11 ENCOUNTER — Ambulatory Visit (INDEPENDENT_AMBULATORY_CARE_PROVIDER_SITE_OTHER): Payer: BLUE CROSS/BLUE SHIELD | Admitting: Internal Medicine

## 2017-01-11 ENCOUNTER — Telehealth: Payer: Self-pay | Admitting: Internal Medicine

## 2017-01-11 VITALS — BP 134/74 | HR 74 | Resp 20 | Wt 137.0 lb

## 2017-01-11 DIAGNOSIS — Z0000 Encounter for general adult medical examination without abnormal findings: Secondary | ICD-10-CM

## 2017-01-11 DIAGNOSIS — F329 Major depressive disorder, single episode, unspecified: Secondary | ICD-10-CM | POA: Diagnosis not present

## 2017-01-11 DIAGNOSIS — F32A Depression, unspecified: Secondary | ICD-10-CM

## 2017-01-11 DIAGNOSIS — J4530 Mild persistent asthma, uncomplicated: Secondary | ICD-10-CM

## 2017-01-11 DIAGNOSIS — L659 Nonscarring hair loss, unspecified: Secondary | ICD-10-CM | POA: Diagnosis not present

## 2017-01-11 DIAGNOSIS — Z0001 Encounter for general adult medical examination with abnormal findings: Secondary | ICD-10-CM

## 2017-01-11 MED ORDER — ESCITALOPRAM OXALATE 5 MG PO TABS: 5.0000 mg | ORAL_TABLET | Freq: Every day | ORAL | 5 refills | Status: DC

## 2017-01-11 NOTE — Patient Instructions (Signed)
Try [employer] Ye[employer] for hair

## 2017-01-11 NOTE — Assessment & Plan Note (Signed)
We discussed age appropriate health related issues, including available/recomended screening tests and vaccinations. We discussed a need for adhering to healthy diet and exercise. Labs/EKG were reviewed/ordered. All questions were answered.   

## 2017-01-11 NOTE — Assessment & Plan Note (Addendum)
2016 [facility] [place] Tried Minoxdil, Proscar 2018  Treat stress - Lexapro

## 2017-01-11 NOTE — Telephone Encounter (Signed)
Patient states she seen Dr. Alain Marion today and generic lexapro was sent to mail order.  Patient is requesting a week supply to be sent to her local pharmacy so she can get started on this.  Patient uses [facility] on [facility].

## 2017-01-11 NOTE — Assessment & Plan Note (Signed)
Breo

## 2017-01-11 NOTE — Progress Notes (Signed)
Subjective:  Patient ID: Sarah Escobar, female    DOB: May 01, 1956  Age: 61 y.o. MRN: [id number]  CC: No chief complaint on file.   HPI [place] presents for a well exam. C/o hair loss. C/o anxiety  Outpatient Medications Prior to Visit  Medication Sig Dispense Refill  . albuterol (PROVENTIL) (2.5 MG/3ML) 0.083% nebulizer solution INHALE CONTENTS OF ONE VIAL VIA NEBULIZER EVERY 4 HOURS AS NEEDED 300 mL 1  . Albuterol Sulfate (PROAIR RESPICLICK) [id number] (90 Base) MCG/ACT AEPB Inhale 1-2 puffs into the lungs 4 (four) times daily as needed. 3 each 3  . albuterol-ipratropium (COMBIVENT) 18-103 MCG/ACT inhaler Inhale 2 puffs into the lungs every 6 (six) hours as needed for wheezing. 3 Inhaler 1  . aspirin EC 81 MG tablet Take 1 tablet (81 mg total) by mouth daily. 100 tablet 3  . Biotin 5000 MCG CAPS Take 2 capsules by mouth daily.    Marland Kitchen BREO ELLIPTA 100-25 MCG/INH AEPB USE 1 INHALATION DAILY 180 each 3  . buPROPion (WELLBUTRIN XL) 300 MG 24 hr tablet Take 1 tablet (300 mg total) by mouth every morning. Yearly physical w/labs due in Feb must see MD for refills 90 tablet 0  . cephALEXin (KEFLEX) 500 MG capsule Take 1 capsule (500 mg total) by mouth 3 (three) times daily. 15 capsule 0  . Cholecalciferol 2000 units TABS Take 1 tablet by mouth daily.    . clonazePAM (KLONOPIN) 0.5 MG tablet TAKE 1 TABLET BY MOUTH AT BEDTIME AS NEEDED 90 tablet 1  . doxycycline (VIBRA-TABS) 100 MG tablet Take 1 tablet (100 mg total) by mouth 2 (two) times daily. 14 tablet 0  . Est Estrogens-Methyltest (ESTRATEST H.S. PO) Take by mouth daily.    . feeding supplement, ENSURE ENLIVE, (ENSURE ENLIVE) LIQD Take 237 mLs by mouth 2 (two) times daily between meals. 237 mL 12  . fexofenadine (ALLEGRA) 180 MG tablet Take 180 mg by mouth daily.    . finasteride (PROSCAR) 5 MG tablet Take 5 mg by mouth daily.    . Flaxseed, Linseed, (FLAXSEED OIL) 1000 MG CAPS Take by mouth 2 (two) times daily.    . fluticasone (FLONASE) 50  MCG/ACT nasal spray USE ONE SPRAY IN EACH  NOSTRIL TWICE DAILY AS  NEEDED 48 g 0  . fluticasone furoate-vilanterol (BREO ELLIPTA) 100-25 MCG/INH AEPB USE 1 INHALATION DAILY 2 each 0  . GuaiFENesin (MUCINEX MAXIMUM STRENGTH PO) Take 2 tablets by mouth daily.    Marland Kitchen ipratropium-albuterol (DUONEB) 0.5-2.5 (3) MG/3ML SOLN INHALE 3 MLS INTO THE LUNGS EVERY 6 (SIX) HOURS AS NEEDED (DX: J45.30). 360 mL 5  . metoprolol tartrate (LOPRESSOR) 25 MG tablet Take 0.5 tablets (12.5 mg total) by mouth 2 (two) times daily. Yearly physical w/labs due in February must see Md for refills 90 tablet 0  . minoxidil (ROGAINE) 2 % external solution Apply topically as directed.    . Multiple Vitamins-Minerals (ONE-A-DAY 50 PLUS PO) Take by mouth daily.    . predniSONE (DELTASONE) 10 MG tablet 40 mg daily x 2 days, then 20mg  daily x 2 days, then 10mg  daily x 2 days, then 5mg  daily x 2 days and stop 15 tablet 0  . simvastatin (ZOCOR) 20 MG tablet Take 1 tablet (20 mg total) by mouth daily at 6 PM. 90 tablet 3  . UNABLE TO FIND 1 scoop daily. Med Name: Collagen Peptides     No facility-administered medications prior to visit.     ROS Review of Systems  Constitutional: Negative for activity change, appetite change, chills, fatigue and unexpected weight change.  HENT: Negative for congestion, mouth sores and sinus pressure.   Eyes: Negative for visual disturbance.  Respiratory: Positive for shortness of breath. Negative for cough and chest tightness.   Gastrointestinal: Negative for abdominal pain and nausea.  Genitourinary: Negative for difficulty urinating, frequency and vaginal pain.  Musculoskeletal: Negative for back pain and gait problem.  Skin: Negative for pallor and rash.  Neurological: Negative for dizziness, tremors, weakness, numbness and headaches.  Psychiatric/Behavioral: Negative for confusion, sleep disturbance and suicidal ideas. The patient is nervous/anxious.   hair loss  Objective:  BP 134/74    Pulse 74   Resp 20   Wt 137 lb (62.1 kg)   SpO2 96%   BMI 22.11 kg/m   BP Readings from Last 3 Encounters:  01/11/17 134/74  12/14/16 130/64  09/12/16 122/70    Wt Readings from Last 3 Encounters:  01/11/17 137 lb (62.1 kg)  12/14/16 137 lb (62.1 kg)  09/12/16 137 lb 3.2 oz (62.2 kg)    Physical Exam  Constitutional: She appears well-developed. No distress.  HENT:  Head: Normocephalic.  Right Ear: External ear normal.  Left Ear: External ear normal.  Nose: Nose normal.  Mouth/Throat: Oropharynx is clear and moist.  Eyes: Conjunctivae are normal. Pupils are equal, round, and reactive to light. Right eye exhibits no discharge. Left eye exhibits no discharge.  Neck: Normal range of motion. Neck supple. No JVD present. No tracheal deviation present. No thyromegaly present.  Cardiovascular: Normal rate, regular rhythm and normal heart sounds.   Pulmonary/Chest: No stridor. No respiratory distress. She has no wheezes.  Abdominal: Soft. Bowel sounds are normal. She exhibits no distension and no mass. There is no tenderness. There is no rebound and no guarding.  Musculoskeletal: She exhibits no edema or tenderness.  Lymphadenopathy:    She has no cervical adenopathy.  Neurological: She displays normal reflexes. No cranial nerve deficit. She exhibits normal muscle tone. Coordination normal.  Skin: No rash noted. No erythema.  Psychiatric: She has a normal mood and affect. Her behavior is normal. Judgment and thought content normal.    Lab Results  Component Value Date   WBC 8.9 12/14/2016   HGB 14.9 11/05/2015   HCT 40.7 12/14/2016   PLT 257 12/14/2016   GLUCOSE 65 12/14/2016   CHOL 109 12/14/2016   TRIG 80 12/14/2016   HDL 42 12/14/2016   LDLCALC 51 12/14/2016   ALT 31 12/14/2016   AST 32 12/14/2016   NA 134 12/14/2016   K 4.3 12/14/2016   CL 94 (L) 12/14/2016   CREATININE 0.81 12/14/2016   BUN 10 12/14/2016   CO2 24 12/14/2016   TSH 1.750 12/14/2016   INR 1.0  01/10/2016    Dg Chest 2 View  Result Date: 03/24/2016 CLINICAL DATA:  Sinus congestion, cough for 2 days. EXAM: CHEST  2 VIEW COMPARISON:  05/10/2015 FINDINGS: Stable chronic peribronchial thickening and interstitial prominence, likely chronic bronchitis. Heart is normal size. No confluent opacities or effusions. No acute bony abnormality. IMPRESSION: Stable chronic bronchitic changes. Electronically Signed   By: Rolm Baptise M.D.   On: 03/24/2016 16:37    Assessment & Plan:   There are no diagnoses linked to this encounter. I am having Ms. Diantonio maintain her Est Estrogens-Methyltest (ESTRATEST H.S. PO), fexofenadine, Flaxseed Oil, Multiple Vitamins-Minerals (ONE-A-DAY 50 PLUS PO), Biotin, feeding supplement (ENSURE ENLIVE), UNABLE TO FIND, albuterol-ipratropium, Cholecalciferol, GuaiFENesin (MUCINEX MAXIMUM STRENGTH PO), aspirin  EC, Albuterol Sulfate, simvastatin, ipratropium-albuterol, fluticasone furoate-vilanterol, clonazePAM, albuterol, doxycycline, BREO ELLIPTA, fluticasone, buPROPion, finasteride, minoxidil, cephALEXin, predniSONE, and metoprolol tartrate.  No orders of the defined types were placed in this encounter.    Follow-up: No Follow-up on file.  Walker Kehr, MD

## 2017-01-11 NOTE — Progress Notes (Signed)
Pre visit review using our clinic review tool, if applicable. No additional management support is needed unless otherwise documented below in the visit note. 

## 2017-01-12 ENCOUNTER — Telehealth: Payer: Self-pay

## 2017-01-12 MED ORDER — ESCITALOPRAM OXALATE 5 MG PO TABS: 5.0000 mg | ORAL_TABLET | Freq: Every day | ORAL | 0 refills | Status: DC

## 2017-01-12 NOTE — Telephone Encounter (Signed)
Medication refill sent to local pharmacy for one week

## 2017-01-12 NOTE — Telephone Encounter (Signed)
OK. Thx

## 2017-01-19 ENCOUNTER — Telehealth: Payer: Self-pay | Admitting: Internal Medicine

## 2017-01-19 NOTE — Telephone Encounter (Signed)
Pt called in and said that she is having some side effects of the lexapro and would like a nurse to call her back about it

## 2017-01-22 NOTE — Telephone Encounter (Signed)
Patient states she is having a little nausea and some constipation, which are possible side effects of this drug---she's been taking lexapro for about 10 days now---she wants to try it a little longer, about another week to see if symptoms improve---if symptoms do not improve, she is wanting to know the best way to stop taking drug ---how to dose it down to come off drug---routing to dr plotnikov, please advise how you want patient to stop taking lexapro if she decides to, I will call patient back, thanks

## 2017-01-22 NOTE — Telephone Encounter (Signed)
Ok just to stop it if needed Thx

## 2017-01-23 NOTE — Telephone Encounter (Signed)
Advised patient dr plotnikovs instructions

## 2017-02-09 ENCOUNTER — Telehealth: Payer: Self-pay | Admitting: Internal Medicine

## 2017-02-09 NOTE — Telephone Encounter (Signed)
Sarah Escobar,  Can you look at patients well visit coding with me?

## 2017-02-11 NOTE — Telephone Encounter (Signed)
Pt was scheduled for wellness but also charged an OV due to other issues addressed ( looks like anxiety/depression and hair loss), so she has a $25.00 copay due. I would definitely recommend she discuss with billing.

## 2017-02-13 NOTE — Telephone Encounter (Signed)
Patient notified

## 2017-02-23 ENCOUNTER — Other Ambulatory Visit: Payer: Self-pay | Admitting: Internal Medicine

## 2017-02-23 ENCOUNTER — Telehealth: Payer: Self-pay | Admitting: Internal Medicine

## 2017-02-23 NOTE — Telephone Encounter (Signed)
Routing to dr plotnikov, please advise, thanks 

## 2017-02-23 NOTE — Telephone Encounter (Signed)
Patient states her brother just past away this morning.  She is requesting Dr. Alain Marion to prescribe her something for her nerves.

## 2017-02-26 NOTE — Telephone Encounter (Signed)
I'm sorry! Ok to use clonazepam bid or tid prn x 2-3 d Thx

## 2017-02-26 NOTE — Telephone Encounter (Signed)
Patient states she can only use clonazepam to help with sleep occasionally----she is also not taking lexapro, she doesn't like the way it makes her feel, routing back to dr plotnikov, is there something else she can take and still be able to function, please advise, thanks

## 2017-02-27 MED ORDER — BUSPIRONE HCL 10 MG PO TABS: 10.0000 mg | ORAL_TABLET | Freq: Two times a day (BID) | ORAL | 3 refills | Status: DC

## 2017-02-27 NOTE — Telephone Encounter (Signed)
Try Buspar - Rx emailed Thx

## 2017-02-27 NOTE — Telephone Encounter (Signed)
Patient will try this one

## 2017-03-02 ENCOUNTER — Other Ambulatory Visit: Payer: Self-pay | Admitting: Internal Medicine

## 2017-03-28 ENCOUNTER — Other Ambulatory Visit: Payer: Self-pay | Admitting: Internal Medicine

## 2017-03-30 ENCOUNTER — Telehealth: Payer: Self-pay | Admitting: Pulmonary Disease

## 2017-03-30 MED ORDER — AMOXICILLIN-POT CLAVULANATE 875-125 MG PO TABS: 1.0000 | ORAL_TABLET | Freq: Two times a day (BID) | ORAL | 0 refills | Status: DC

## 2017-03-30 NOTE — Telephone Encounter (Signed)
Spoke with pt and made her aware of MW's recommendations. Pt agreed to the medication being called in. Her rx was sent, she had no further questions. Nothing further is needed

## 2017-03-30 NOTE — Telephone Encounter (Signed)
Pt c/o sinus congestion, PND, runny nose, prod cough with yellow/green mucus X3-4 days.   Denies chest pain, fever, chills. Has been taking mucinex and advil to help with symptoms.  Pt requesting further recs.    Pt uses [facility] in [place].    Sending to DOD as RA is unavailable.  MW please advise on recs.  Thanks.   09/12/16 AVS: Instructions     Return in about 6 months (around 03/13/2017) for TP.   Stay on [place] to use albuterol nebs as needed Nettie pot to clear sinuses   Call as needed-lung function seems to be maintained

## 2017-03-30 NOTE — Telephone Encounter (Signed)
Augmentin 875 mg take one pill twice daily  X 10 days - take at breakfast and supper with large glass of water.  It would help reduce the usual side effects (diarrhea and yeast infections) if you ate cultured yogurt at lunch.  

## 2017-04-01 ENCOUNTER — Other Ambulatory Visit: Payer: Self-pay | Admitting: Pulmonary Disease

## 2017-04-13 ENCOUNTER — Ambulatory Visit: Payer: BLUE CROSS/BLUE SHIELD | Admitting: Internal Medicine

## 2017-04-27 ENCOUNTER — Encounter: Payer: Self-pay | Admitting: Internal Medicine

## 2017-04-27 ENCOUNTER — Ambulatory Visit (INDEPENDENT_AMBULATORY_CARE_PROVIDER_SITE_OTHER): Payer: BLUE CROSS/BLUE SHIELD | Admitting: Internal Medicine

## 2017-04-27 DIAGNOSIS — F419 Anxiety disorder, unspecified: Secondary | ICD-10-CM | POA: Diagnosis not present

## 2017-04-27 DIAGNOSIS — I10 Essential (primary) hypertension: Secondary | ICD-10-CM | POA: Diagnosis not present

## 2017-04-27 DIAGNOSIS — L659 Nonscarring hair loss, unspecified: Secondary | ICD-10-CM

## 2017-04-27 MED ORDER — IPRATROPIUM-ALBUTEROL 20-100 MCG/ACT IN AERS: 1.0000 | INHALATION_SPRAY | Freq: Four times a day (QID) | RESPIRATORY_TRACT | 3 refills | Status: DC | PRN

## 2017-04-27 MED ORDER — LORAZEPAM 0.5 MG PO TABS: 0.5000 mg | ORAL_TABLET | Freq: Every evening | ORAL | 2 refills | Status: DC | PRN

## 2017-04-27 MED ORDER — AMOXICILLIN-POT CLAVULANATE 875-125 MG PO TABS: 1.0000 | ORAL_TABLET | Freq: Two times a day (BID) | ORAL | 0 refills | Status: DC

## 2017-04-27 NOTE — Progress Notes (Signed)
Subjective:  Patient ID: Sarah Escobar, female    DOB: [birth year]  Age: 61 y.o. MRN: [id number]  CC: No chief complaint on file.   HPI Sarah Escobar presents for anxiety, asthma, hair loss f/u. She was Augmentin x10 d 1 mo ago - worse again...  Outpatient Medications Prior to Visit  Medication Sig Dispense Refill  . Albuterol Sulfate (PROAIR RESPICLICK) 970 (90 Base) MCG/ACT AEPB Inhale 1-2 puffs into the lungs 4 (four) times daily as needed. 3 each 3  . albuterol-ipratropium (COMBIVENT) 18-103 MCG/ACT inhaler Inhale 2 puffs into the lungs every 6 (six) hours as needed for wheezing. 3 Inhaler 1  . aspirin EC 81 MG tablet Take 1 tablet (81 mg total) by mouth daily. 100 tablet 3  . Biotin 5000 MCG CAPS Take 2 capsules by mouth daily.    Marland Kitchen BREO ELLIPTA 100-25 MCG/INH AEPB USE 1 INHALATION DAILY 180 each 3  . buPROPion (WELLBUTRIN XL) 300 MG 24 hr tablet Take 1 tablet (300 mg total) by mouth every morning. 90 tablet 2  . Cholecalciferol 2000 units TABS Take 1 tablet by mouth daily.    . clonazePAM (KLONOPIN) 0.5 MG tablet TAKE 1 TABLET BY MOUTH AT BEDTIME AS NEEDED 90 tablet 1  . Est Estrogens-Methyltest (ESTRATEST H.S. PO) Take by mouth daily.    . feeding supplement, ENSURE ENLIVE, (ENSURE ENLIVE) LIQD Take 237 mLs by mouth 2 (two) times daily between meals. 237 mL 12  . fexofenadine (ALLEGRA) 180 MG tablet Take 180 mg by mouth daily.    . finasteride (PROSCAR) 5 MG tablet Take 5 mg by mouth daily.    . Flaxseed, Linseed, (FLAXSEED OIL) 1000 MG CAPS Take by mouth 2 (two) times daily.    . fluticasone (FLONASE) 50 MCG/ACT nasal spray USE 1 SPRAY IN EACH NOSTRIL TWO TIMES A DAY AS NEEDED 48 g 1  . fluticasone furoate-vilanterol (BREO ELLIPTA) 100-25 MCG/INH AEPB USE 1 INHALATION DAILY 2 each 0  . GuaiFENesin (MUCINEX MAXIMUM STRENGTH PO) Take 2 tablets by mouth daily.    Marland Kitchen ipratropium-albuterol (DUONEB) 0.5-2.5 (3) MG/3ML SOLN INHALE 1 VIAL INTO THE LUNGS VIA NEBULIZER EVERY 6 (SIX) HOURS  AS NEEDED 360 mL 5  . metoprolol tartrate (LOPRESSOR) 25 MG tablet Take 0.5 tablets (12.5 mg total) by mouth 2 (two) times daily. Yearly physical w/labs due in February must see Md for refills 90 tablet 0  . minoxidil (ROGAINE) 2 % external solution Apply topically as directed.    . Multiple Vitamins-Minerals (ONE-A-DAY 50 PLUS PO) Take by mouth daily.    . simvastatin (ZOCOR) 20 MG tablet TAKE 1 TABLET BY MOUTH  DAILY AT 6 PM. 90 tablet 2  . UNABLE TO FIND 1 scoop daily. Med Name: Collagen Peptides    . albuterol (PROVENTIL) (2.5 MG/3ML) 0.083% nebulizer solution INHALE CONTENTS OF ONE VIAL VIA NEBULIZER EVERY 4 HOURS AS NEEDED 300 mL 1  . amoxicillin-clavulanate (AUGMENTIN) 875-125 MG tablet Take 1 tablet by mouth 2 (two) times daily. 20 tablet 0  . busPIRone (BUSPAR) 10 MG tablet Take 1 tablet (10 mg total) by mouth 2 (two) times daily. 60 tablet 3  . escitalopram (LEXAPRO) 5 MG tablet Take 1 tablet (5 mg total) by mouth daily. 7 tablet 0   No facility-administered medications prior to visit.     ROS Review of Systems  Constitutional: Negative for activity change, appetite change, chills, fatigue and unexpected weight change.  HENT: Positive for congestion, sinus pain, sinus pressure and voice  change. Negative for mouth sores.   Eyes: Negative for visual disturbance.  Respiratory: Positive for cough. Negative for chest tightness.   Gastrointestinal: Negative for abdominal pain and nausea.  Genitourinary: Negative for difficulty urinating, frequency and vaginal pain.  Musculoskeletal: Negative for back pain and gait problem.  Skin: Negative for pallor and rash.  Neurological: Negative for dizziness, tremors, weakness, numbness and headaches.  Psychiatric/Behavioral: Negative for confusion, sleep disturbance and suicidal ideas. The patient is nervous/anxious.     Objective:  BP (!) 150/92 (BP Location: Left Arm, Patient Position: Sitting, Cuff Size: Normal)   Pulse 90   Temp 98.3 F  (36.8 C) (Oral)   Ht 5\' 6"  (1.676 m)   Wt 137 lb (62.1 kg)   SpO2 96%   BMI 22.11 kg/m   BP Readings from Last 3 Encounters:  04/27/17 (!) 150/92  01/11/17 134/74  12/14/16 130/64    Wt Readings from Last 3 Encounters:  04/27/17 137 lb (62.1 kg)  01/11/17 137 lb (62.1 kg)  12/14/16 137 lb (62.1 kg)    Physical Exam  Constitutional: She appears well-developed. No distress.  HENT:  Head: Normocephalic.  Right Ear: External ear normal.  Left Ear: External ear normal.  Nose: Nose normal.  Mouth/Throat: Oropharynx is clear and moist.  Eyes: Conjunctivae are normal. Pupils are equal, round, and reactive to light. Right eye exhibits no discharge. Left eye exhibits no discharge.  Neck: Normal range of motion. Neck supple. No JVD present. No tracheal deviation present. No thyromegaly present.  Cardiovascular: Normal rate, regular rhythm and normal heart sounds.   Pulmonary/Chest: No stridor. No respiratory distress. She has no wheezes.  Abdominal: Soft. Bowel sounds are normal. She exhibits no distension and no mass. There is no tenderness. There is no rebound and no guarding.  Musculoskeletal: She exhibits no edema or tenderness.  Lymphadenopathy:    She has no cervical adenopathy.  Neurological: She displays normal reflexes. No cranial nerve deficit. She exhibits normal muscle tone. Coordination normal.  Skin: No rash noted. No erythema.  Psychiatric: She has a normal mood and affect. Her behavior is normal. Judgment and thought content normal.  eryth mucosa  Lab Results  Component Value Date   WBC 8.9 12/14/2016   HGB 14.9 11/05/2015   HCT 40.7 12/14/2016   PLT 257 12/14/2016   GLUCOSE 65 12/14/2016   CHOL 109 12/14/2016   TRIG 80 12/14/2016   HDL 42 12/14/2016   LDLCALC 51 12/14/2016   ALT 31 12/14/2016   AST 32 12/14/2016   NA 134 12/14/2016   K 4.3 12/14/2016   CL 94 (L) 12/14/2016   CREATININE 0.81 12/14/2016   BUN 10 12/14/2016   CO2 24 12/14/2016   TSH  1.750 12/14/2016   INR 1.0 01/10/2016    Dg Chest 2 View  Result Date: 03/24/2016 CLINICAL DATA:  Sinus congestion, cough for 2 days. EXAM: CHEST  2 VIEW COMPARISON:  05/10/2015 FINDINGS: Stable chronic peribronchial thickening and interstitial prominence, likely chronic bronchitis. Heart is normal size. No confluent opacities or effusions. No acute bony abnormality. IMPRESSION: Stable chronic bronchitic changes. Electronically Signed   By: Rolm Baptise M.D.   On: 03/24/2016 16:37    Assessment & Plan:   There are no diagnoses linked to this encounter. I have discontinued Ms. Deviney's albuterol, escitalopram, busPIRone, and amoxicillin-clavulanate. I am also having her maintain her Est Estrogens-Methyltest (ESTRATEST H.S. PO), fexofenadine, Flaxseed Oil, Multiple Vitamins-Minerals (ONE-A-DAY 50 PLUS PO), Biotin, feeding supplement (ENSURE ENLIVE), UNABLE TO  FIND, albuterol-ipratropium, Cholecalciferol, GuaiFENesin (MUCINEX MAXIMUM STRENGTH PO), aspirin EC, Albuterol Sulfate, fluticasone furoate-vilanterol, clonazePAM, BREO ELLIPTA, finasteride, minoxidil, metoprolol tartrate, buPROPion, simvastatin, fluticasone, and ipratropium-albuterol.  No orders of the defined types were placed in this encounter.    Follow-up: No Follow-up on file.  Walker Kehr, MD

## 2017-04-30 DIAGNOSIS — F419 Anxiety disorder, unspecified: Secondary | ICD-10-CM | POA: Insufficient documentation

## 2017-04-30 NOTE — Assessment & Plan Note (Signed)
Reduce Proscar to 1/4 tab a day

## 2017-04-30 NOTE — Assessment & Plan Note (Signed)
Lorazepam prn  Potential benefits of a long term benzodiazepines  use as well as potential risks  and complications were explained to the patient and were aknowledged.  

## 2017-04-30 NOTE — Assessment & Plan Note (Signed)
On Toprol 

## 2017-05-10 ENCOUNTER — Other Ambulatory Visit: Payer: Self-pay | Admitting: Internal Medicine

## 2017-06-22 ENCOUNTER — Telehealth: Payer: Self-pay | Admitting: Pulmonary Disease

## 2017-06-22 MED ORDER — AMOXICILLIN-POT CLAVULANATE 875-125 MG PO TABS: 1.0000 | ORAL_TABLET | Freq: Two times a day (BID) | ORAL | 0 refills | Status: DC

## 2017-06-22 NOTE — Telephone Encounter (Signed)
Called and spoke with pt and she is aware of RA recs.  Medication has been sent to the pharmacy and nothing further is needed.

## 2017-06-22 NOTE — Telephone Encounter (Signed)
Augmentin 875 twice a day for 7 days

## 2017-06-22 NOTE — Telephone Encounter (Signed)
Spoke with patient. She stated she thinks she has a sinus infection. Low energy, severe headaches, facial pressure and green nasal discharge for the past few days. She is requesting an antibiotic.   She uses to use [facility] in [place].   RA, please advise. Thanks!

## 2017-07-22 ENCOUNTER — Other Ambulatory Visit: Payer: Self-pay | Admitting: Internal Medicine

## 2017-09-14 ENCOUNTER — Other Ambulatory Visit: Payer: Self-pay | Admitting: Internal Medicine

## 2017-09-23 ENCOUNTER — Other Ambulatory Visit: Payer: Self-pay | Admitting: Pulmonary Disease

## 2017-09-23 ENCOUNTER — Other Ambulatory Visit: Payer: Self-pay | Admitting: Internal Medicine

## 2017-09-25 ENCOUNTER — Other Ambulatory Visit: Payer: Self-pay

## 2017-09-25 MED ORDER — METOPROLOL TARTRATE 25 MG PO TABS: 12.5000 mg | ORAL_TABLET | Freq: Two times a day (BID) | ORAL | 1 refills | Status: DC

## 2017-10-08 ENCOUNTER — Telehealth: Payer: Self-pay | Admitting: Pulmonary Disease

## 2017-10-08 MED ORDER — AZITHROMYCIN 250 MG PO TABS: ORAL_TABLET | ORAL | 0 refills | Status: DC

## 2017-10-08 NOTE — Telephone Encounter (Signed)
z-pak 

## 2017-10-08 NOTE — Telephone Encounter (Signed)
Called and spoke with pt and she is aware of RA recs and that the zpak has been sent to the pharmacy

## 2017-10-08 NOTE — Telephone Encounter (Signed)
Spoke with pt, who reports of prod cough with yellowish to green mucus, wheezing, chest tightness, body aches and chills x1w.  Pt is requesting abx, pt declined apt. Pt last seen 09/12/16 and was instructed to f/u in 19mo.  RA please advise. Thanks.

## 2017-10-31 ENCOUNTER — Ambulatory Visit: Payer: BLUE CROSS/BLUE SHIELD | Admitting: Internal Medicine

## 2017-10-31 ENCOUNTER — Encounter: Payer: Self-pay | Admitting: Internal Medicine

## 2017-10-31 VITALS — BP 142/86 | HR 78 | Temp 98.2°F | Ht 66.0 in | Wt 139.0 lb

## 2017-10-31 DIAGNOSIS — F419 Anxiety disorder, unspecified: Secondary | ICD-10-CM

## 2017-10-31 DIAGNOSIS — F329 Major depressive disorder, single episode, unspecified: Secondary | ICD-10-CM

## 2017-10-31 DIAGNOSIS — F32A Depression, unspecified: Secondary | ICD-10-CM

## 2017-10-31 DIAGNOSIS — R7989 Other specified abnormal findings of blood chemistry: Secondary | ICD-10-CM

## 2017-10-31 DIAGNOSIS — L659 Nonscarring hair loss, unspecified: Secondary | ICD-10-CM | POA: Diagnosis not present

## 2017-10-31 DIAGNOSIS — J4530 Mild persistent asthma, uncomplicated: Secondary | ICD-10-CM | POA: Diagnosis not present

## 2017-10-31 DIAGNOSIS — R945 Abnormal results of liver function studies: Secondary | ICD-10-CM | POA: Diagnosis not present

## 2017-10-31 DIAGNOSIS — Z23 Encounter for immunization: Secondary | ICD-10-CM | POA: Diagnosis not present

## 2017-10-31 NOTE — Assessment & Plan Note (Addendum)
Pt had a nl CMET 1 mo ago  Minoxdil, Proscar, spironolactone - better

## 2017-10-31 NOTE — Assessment & Plan Note (Signed)
Lorazepam prn 

## 2017-10-31 NOTE — Assessment & Plan Note (Signed)
Breo

## 2017-10-31 NOTE — Assessment & Plan Note (Signed)
Pt had a nl CMET 1 mo ago

## 2017-10-31 NOTE — Progress Notes (Signed)
Subjective:  Patient ID: Sarah Escobar, female    DOB: 10-14-1956  Age: 61 y.o. MRN: [id number]  CC: No chief complaint on file.   HPI Sarah Escobar presents for hair loss, anxiety, HTN f/u. Pt had a nl CMET 1 mo ago  Outpatient Medications Prior to Visit  Medication Sig Dispense Refill  . albuterol (PROVENTIL) (2.5 MG/3ML) 0.083% nebulizer solution INHALE CONTENTS OF ONE VIAL VIA NEBULIZER EVERY 4 HOURS AS NEEDED 300 mL 1  . aspirin EC 81 MG tablet Take 1 tablet (81 mg total) by mouth daily. 100 tablet 3  . Biotin 5000 MCG CAPS Take 2 capsules by mouth daily.    Marland Kitchen BREO ELLIPTA 100-25 MCG/INH AEPB USE 1 INHALATION DAILY 180 each 0  . buPROPion (WELLBUTRIN XL) 300 MG 24 hr tablet TAKE 1 TABLET BY MOUTH  EVERY MORNING 90 tablet 1  . Cholecalciferol 2000 units TABS Take 1 tablet by mouth daily.    . Est Estrogens-Methyltest (ESTRATEST H.S. PO) Take by mouth daily.    . feeding supplement, ENSURE ENLIVE, (ENSURE ENLIVE) LIQD Take 237 mLs by mouth 2 (two) times daily between meals. 237 mL 12  . fexofenadine (ALLEGRA) 180 MG tablet Take 180 mg by mouth daily.    . finasteride (PROSCAR) 5 MG tablet Take 12.5 mg by mouth daily.     . Flaxseed, Linseed, (FLAXSEED OIL) 1000 MG CAPS Take by mouth 2 (two) times daily.    . fluticasone (FLONASE) 50 MCG/ACT nasal spray USE 1 SPRAY IN EACH NOSTRIL TWO TIMES A DAY AS NEEDED 48 g 1  . GuaiFENesin (MUCINEX MAXIMUM STRENGTH PO) Take 2 tablets by mouth daily.    . Ipratropium-Albuterol (COMBIVENT RESPIMAT) 20-100 MCG/ACT AERS respimat Inhale 1 puff into the lungs 4 (four) times daily as needed for wheezing. 3 Inhaler 3  . ipratropium-albuterol (DUONEB) 0.5-2.5 (3) MG/3ML SOLN INHALE 1 VIAL INTO THE LUNGS VIA NEBULIZER EVERY 6 (SIX) HOURS AS NEEDED 360 mL 5  . LORazepam (ATIVAN) 0.5 MG tablet Take 1-2 tablets (0.5-1 mg total) by mouth at bedtime as needed for anxiety. 60 tablet 2  . metoprolol tartrate (LOPRESSOR) 25 MG tablet Take 0.5 tablets (12.5 mg  total) by mouth 2 (two) times daily. Yearly physical w/labs due in February must see Md for refills 90 tablet 1  . minoxidil (ROGAINE) 2 % external solution Apply topically as directed.    . Multiple Vitamins-Minerals (ONE-A-DAY 50 PLUS PO) Take by mouth daily.    . simvastatin (ZOCOR) 20 MG tablet TAKE 1 TABLET BY MOUTH  DAILY AT 6 PM. 90 tablet 2  . UNABLE TO FIND 1 scoop daily. Med Name: Collagen Peptides    . amoxicillin-clavulanate (AUGMENTIN) 875-125 MG tablet Take 1 tablet by mouth 2 (two) times daily. 14 tablet 0  . azithromycin (ZITHROMAX) 250 MG tablet Take 2 tablets by mouth today then 1 daily until gone. 6 tablet 0  . fluticasone furoate-vilanterol (BREO ELLIPTA) 100-25 MCG/INH AEPB USE 1 INHALATION DAILY 2 each 0   No facility-administered medications prior to visit.     ROS Review of Systems  Constitutional: Negative for activity change, appetite change, chills, fatigue and unexpected weight change.  HENT: Negative for congestion, mouth sores and sinus pressure.   Eyes: Negative for visual disturbance.  Respiratory: Negative for cough and chest tightness.   Gastrointestinal: Negative for abdominal pain and nausea.  Genitourinary: Negative for difficulty urinating, frequency and vaginal pain.  Musculoskeletal: Negative for back pain and gait problem.  Skin: Negative for pallor and rash.  Neurological: Negative for dizziness, tremors, weakness, numbness and headaches.  Psychiatric/Behavioral: Negative for confusion, sleep disturbance and suicidal ideas. The patient is nervous/anxious.     Objective:  BP (!) 142/86 (BP Location: Left Arm, Patient Position: Sitting, Cuff Size: Normal)   Pulse 78   Temp 98.2 F (36.8 C) (Oral)   Ht 5\' 6"  (1.676 m)   Wt 139 lb (63 kg)   SpO2 99%   BMI 22.44 kg/m   BP Readings from Last 3 Encounters:  10/31/17 (!) 142/86  04/27/17 (!) 150/92  01/11/17 134/74    Wt Readings from Last 3 Encounters:  10/31/17 139 lb (63 kg)  04/27/17  137 lb (62.1 kg)  01/11/17 137 lb (62.1 kg)    Physical Exam  Constitutional: She appears well-developed. No distress.  HENT:  Head: Normocephalic.  Right Ear: External ear normal.  Left Ear: External ear normal.  Nose: Nose normal.  Mouth/Throat: Oropharynx is clear and moist.  Eyes: Conjunctivae are normal. Pupils are equal, round, and reactive to light. Right eye exhibits no discharge. Left eye exhibits no discharge.  Neck: Normal range of motion. Neck supple. No JVD present. No tracheal deviation present. No thyromegaly present.  Cardiovascular: Normal rate, regular rhythm and normal heart sounds.  Pulmonary/Chest: No stridor. No respiratory distress. She has no wheezes.  Abdominal: Soft. Bowel sounds are normal. She exhibits no distension and no mass. There is no tenderness. There is no rebound and no guarding.  Musculoskeletal: She exhibits no edema or tenderness.  Lymphadenopathy:    She has no cervical adenopathy.  Neurological: She displays normal reflexes. No cranial nerve deficit. She exhibits normal muscle tone. Coordination normal.  Skin: No rash noted. No erythema.  Psychiatric: She has a normal mood and affect. Her behavior is normal. Judgment and thought content normal.    Lab Results  Component Value Date   WBC 8.9 12/14/2016   HGB 14.1 12/14/2016   HCT 40.7 12/14/2016   PLT 257 12/14/2016   GLUCOSE 65 12/14/2016   CHOL 109 12/14/2016   TRIG 80 12/14/2016   HDL 42 12/14/2016   LDLCALC 51 12/14/2016   ALT 31 12/14/2016   AST 32 12/14/2016   NA 134 12/14/2016   K 4.3 12/14/2016   CL 94 (L) 12/14/2016   CREATININE 0.81 12/14/2016   BUN 10 12/14/2016   CO2 24 12/14/2016   TSH 1.750 12/14/2016   INR 1.0 01/10/2016    Dg Chest 2 View  Result Date: 03/24/2016 CLINICAL DATA:  Sinus congestion, cough for 2 days. EXAM: CHEST  2 VIEW COMPARISON:  05/10/2015 FINDINGS: Stable chronic peribronchial thickening and interstitial prominence, likely chronic bronchitis.  Heart is normal size. No confluent opacities or effusions. No acute bony abnormality. IMPRESSION: Stable chronic bronchitic changes. Electronically Signed   By: Rolm Baptise M.D.   On: 03/24/2016 16:37    Assessment & Plan:   There are no diagnoses linked to this encounter. I have discontinued Anyely S. Reckart's fluticasone furoate-vilanterol, amoxicillin-clavulanate, and azithromycin. I am also having her maintain her Est Estrogens-Methyltest (ESTRATEST H.S. PO), fexofenadine, Flaxseed Oil, Multiple Vitamins-Minerals (ONE-A-DAY 50 PLUS PO), Biotin, feeding supplement (ENSURE ENLIVE), UNABLE TO FIND, Cholecalciferol, GuaiFENesin (MUCINEX MAXIMUM STRENGTH PO), aspirin EC, finasteride, minoxidil, simvastatin, ipratropium-albuterol, LORazepam, Ipratropium-Albuterol, fluticasone, albuterol, buPROPion, BREO ELLIPTA, and metoprolol tartrate.  No orders of the defined types were placed in this encounter.    Follow-up: No Follow-up on file.  Walker Kehr, MD

## 2017-10-31 NOTE — Addendum Note (Signed)
Addended by: Karren Cobble on: 10/31/2017 04:31 PM   Modules accepted: Orders

## 2017-10-31 NOTE — Assessment & Plan Note (Signed)
-  

## 2017-12-01 ENCOUNTER — Other Ambulatory Visit: Payer: Self-pay | Admitting: Pulmonary Disease

## 2017-12-06 ENCOUNTER — Other Ambulatory Visit: Payer: Self-pay | Admitting: Internal Medicine

## 2017-12-12 ENCOUNTER — Telehealth: Payer: Self-pay | Admitting: Pulmonary Disease

## 2017-12-12 MED ORDER — AZITHROMYCIN 250 MG PO TABS: ORAL_TABLET | ORAL | 0 refills | Status: DC

## 2017-12-12 NOTE — Telephone Encounter (Signed)
Spoke with patient. She stated that she has been sick for the past 4-5 days with a productive cough with green mucus and body aches. Denies any fever or chest pain.   She wishes to use [facility] in [place]  RA, please advise. Thanks!

## 2017-12-12 NOTE — Telephone Encounter (Signed)
Routing message to DOD as RA has not yet addressed this.  VS please advise on recs.  Thanks.

## 2017-12-12 NOTE — Telephone Encounter (Signed)
Can send script for Zpak.  If no better, than she needs ROV.

## 2017-12-12 NOTE — Telephone Encounter (Signed)
Spoke with pt, aware of recs.  rx sent to preferred pharmacy.  Nothing further needed.  

## 2017-12-13 NOTE — Telephone Encounter (Signed)
She needs OV with NP/me - has not been seen > 1 year

## 2017-12-13 NOTE — Telephone Encounter (Signed)
lmtcb X1 for pt  

## 2017-12-14 ENCOUNTER — Encounter (INDEPENDENT_AMBULATORY_CARE_PROVIDER_SITE_OTHER): Payer: Self-pay

## 2017-12-14 ENCOUNTER — Encounter: Payer: Self-pay | Admitting: Internal Medicine

## 2017-12-14 ENCOUNTER — Ambulatory Visit: Payer: BLUE CROSS/BLUE SHIELD | Admitting: Internal Medicine

## 2017-12-14 ENCOUNTER — Telehealth: Payer: Self-pay | Admitting: Pulmonary Disease

## 2017-12-14 VITALS — BP 138/86 | HR 76 | Ht 66.0 in | Wt 142.8 lb

## 2017-12-14 DIAGNOSIS — I10 Essential (primary) hypertension: Secondary | ICD-10-CM | POA: Diagnosis not present

## 2017-12-14 DIAGNOSIS — E782 Mixed hyperlipidemia: Secondary | ICD-10-CM | POA: Diagnosis not present

## 2017-12-14 DIAGNOSIS — I2510 Atherosclerotic heart disease of native coronary artery without angina pectoris: Secondary | ICD-10-CM | POA: Diagnosis not present

## 2017-12-14 MED ORDER — AMLODIPINE BESYLATE 2.5 MG PO TABS: 2.5000 mg | ORAL_TABLET | Freq: Every day | ORAL | 3 refills | Status: DC

## 2017-12-14 MED ORDER — DOXYCYCLINE HYCLATE 100 MG PO TABS: 100.0000 mg | ORAL_TABLET | Freq: Every day | ORAL | 0 refills | Status: DC

## 2017-12-14 NOTE — Telephone Encounter (Signed)
Spoke with pt, aware of recs.  rx sent to preferred pharmacy.   Attempted to schedule rov with pt but stated she was unavailable to schedule something at this time and would call back.  Keeping message open to ensure rov is scheduled as pt has not been seen in over a year.

## 2017-12-14 NOTE — Telephone Encounter (Signed)
Pl arrange OV Doxy 100 daily x 7ds

## 2017-12-14 NOTE — Telephone Encounter (Signed)
Left message for patient

## 2017-12-14 NOTE — Progress Notes (Signed)
Cardiology Office Note   Date:  12/14/2017   ID:  JET TRAYNHAM, DOB 25-Jul-1956, MRN [id number]  PCP:  Cassandria Anger, MD  Cardiologist:   Dorris Carnes, MD   Pt prsents for F/U of CAD      History of Present Illness: Sarah Escobar is a 62 y.o. female with a history of asthamtic bronchitis and bronchiectasis   and coronary calcifications  I saw Sarah Escobar 1 year ago    SInce seen she has done OK  No CP  Breathing is stable  Does have hair loss  Seen at [facility]    Patient said Sarah Escobar berathing is OK  On therapy Denies CP   THinks Sarah Escobar hair was better off of metoprolol Given Rx for spironolactone by dermatologist  She is  Afraid to try     Current Meds  Medication Sig  . albuterol (PROVENTIL) (2.5 MG/3ML) 0.083% nebulizer solution INHALE CONTENTS OF ONE VIAL VIA NEBULIZER EVERY 4 HOURS AS NEEDED  . Biotin 5000 MCG CAPS Take 2 capsules by mouth daily.  Marland Kitchen BREO ELLIPTA 100-25 MCG/INH AEPB USE 1 INHALATION DAILY  . buPROPion (WELLBUTRIN XL) 300 MG 24 hr tablet TAKE 1 TABLET BY MOUTH  EVERY MORNING  . Cholecalciferol 2000 units TABS Take 1 tablet by mouth daily.  . Est Estrogens-Methyltest (ESTRATEST H.S. PO) Take by mouth daily.  . feeding supplement, ENSURE ENLIVE, (ENSURE ENLIVE) LIQD Take 237 mLs by mouth 2 (two) times daily between meals.  . fexofenadine (ALLEGRA) 180 MG tablet Take 180 mg by mouth daily.  . finasteride (PROSCAR) 5 MG tablet Take 12.5 mg by mouth daily.   . Flaxseed, Linseed, (FLAXSEED OIL) 1000 MG CAPS Take by mouth 2 (two) times daily.  . fluticasone (FLONASE) 50 MCG/ACT nasal spray USE 1 SPRAY IN EACH NOSTRIL TWO TIMES A DAY AS NEEDED  . GuaiFENesin (MUCINEX MAXIMUM STRENGTH PO) Take 2 tablets by mouth daily.  . Ipratropium-Albuterol (COMBIVENT RESPIMAT) 20-100 MCG/ACT AERS respimat Inhale 1 puff into the lungs 4 (four) times daily as needed for wheezing.  Marland Kitchen ipratropium-albuterol (DUONEB) 0.5-2.5 (3) MG/3ML SOLN INHALE 1 VIAL INTO THE LUNGS VIA NEBULIZER EVERY 6  (SIX) HOURS AS NEEDED  . LORazepam (ATIVAN) 0.5 MG tablet Take 1-2 tablets (0.5-1 mg total) by mouth at bedtime as needed for anxiety.  . metoprolol tartrate (LOPRESSOR) 25 MG tablet Take 0.5 tablets (12.5 mg total) by mouth 2 (two) times daily. Yearly physical w/labs due in February must see Md for refills  . minoxidil (ROGAINE) 2 % external solution Apply topically as directed.  . Multiple Vitamins-Minerals (ONE-A-DAY 50 PLUS PO) Take by mouth daily.  . simvastatin (ZOCOR) 20 MG tablet TAKE 1 TABLET BY MOUTH  DAILY AT 6 PM.  . UNABLE TO FIND 1 scoop daily. Med Name: Collagen Peptides     Allergies:   Flagyl [metronidazole]; Levofloxacin; Loperamide hcl; Neomycin-bacitracin zn-polymyx; Ofloxacin; Sulfonamide derivatives; Band-aid liquid bandage [new skin]; Collodion; Gabapentin; Latex; Other; Sulfa antibiotics; Loperamide; Oxycodone; and Oxycontin [oxycodone hcl]   Past Medical History:  Diagnosis Date  . Allergy   . Asthma    adult onset  . Depression   . Hyperlipidemia    LDL goal = < 100  . Hypertension   . Osteopenia    mild  . Personal history of adenomatous colonic polyps/FHx colon cancer sister and father    [name]  . Sciatica of left side    intermittent    Past Surgical History:  Procedure Laterality Date  .  BUNIONECTOMY    . CERVICAL FUSION  1999   Dr Arnoldo Morale, [place]  . COLONOSCOPY  2013  . COLONOSCOPY W/ POLYPECTOMY  multiple   adenomas; [name]; last 2013  . discetomy lumbar  02/19/13   L1&2; Dr Arnoldo Morale, [place]  . NASAL SINUS SURGERY  [phone]   X 3  . POLYPECTOMY  1217--2013   TA+  . TOTAL ABDOMINAL HYSTERECTOMY W/ BILATERAL SALPINGOOPHORECTOMY  2003   endometriosis      Social History:  The patient  reports that she quit smoking about 16 years ago. Sarah Escobar smoking use included cigarettes. She has a 30.00 pack-year smoking history. she has never used smokeless tobacco. She reports that she drinks alcohol. She reports that she does not use drugs.    Family History:  The patient's family history includes Anemia in Sarah Escobar brother; Asthma in Sarah Escobar maternal aunt, maternal grandmother, mother, and sister; Cirrhosis in Sarah Escobar brother and mother; Colitis in Sarah Escobar mother; Colon cancer in Sarah Escobar father and sister; Colon polyps in Sarah Escobar brother; Diabetes in Sarah Escobar father; Heart attack (age of onset: 76) in Sarah Escobar sister; Heart attack (age of onset: 34) in Sarah Escobar father; Kidney failure in Sarah Escobar unknown relative; Prostate cancer in Sarah Escobar brother; Stroke (age of onset: 16) in Sarah Escobar father.    ROS:  Please see the history of present illness. All other systems are reviewed and  Negative to the above problem except as noted.    PHYSICAL EXAM: VS:  BP 138/86   Pulse 76   Ht 5\' 6"  (1.676 m)   Wt 142 lb 12.8 oz (64.8 kg)   BMI 23.05 kg/m   GEN: Well nourished, well developed, in no acute distress  HEENT: normal  Neck: no JVD, carotid bruits, or masses Cardiac: RRR; no murmurs, rubs, or gallops,no edema  Respiratory:  clear to auscultation bilaterally, normal work of breathing GI: soft, nontender, nondistended, + BS  No hepatomegaly  MS: no deformity Moving all extremities   Skin: warm and dry, no rash Neuro:  Strength and sensation are intact Psych: euthymic mood, full affect   EKG:  EKG is ordered today.  SR 76 bpm     Lipid Panel    Component Value Date/Time   CHOL 109 12/14/2016 0855   CHOL 121 (L) 12/27/2015 0801   TRIG 80 12/14/2016 0855   TRIG 62 12/27/2015 0801   HDL 42 12/14/2016 0855   HDL 42 (L) 12/27/2015 0801   CHOLHDL 2.6 12/14/2016 0855   CHOLHDL 2.9 12/27/2015 0801   CHOLHDL 3 11/05/2015 0756   VLDL 14.8 11/05/2015 0756   LDLCALC 51 12/14/2016 0855   LDLCALC 67 12/27/2015 0801      Wt Readings from Last 3 Encounters:  12/14/17 142 lb 12.8 oz (64.8 kg)  10/31/17 139 lb (63 kg)  04/27/17 137 lb (62.1 kg)      ASSESSMENT AND PLAN:  Pt doing good    1 CAD  Coronary calcifications   I am not convinced breathing represents angina  Appears  to be asymtpomatic    2 HL  Will get lipds   3  Pulm  Followed by R [facility]va  4    HTN  Stop metorplol  Start 2.5 amldopine daily  Pt will email in BP results  5  Derm.  [facility]owed at [facility]  Encouraged Sarah Escobar to wakl, and exercise    F/U in 1 year    Current medicines are reviewed at length with the patient today.  The patient does not have  concerns regarding medicines.  Signed, Dorris Carnes, MD  12/14/2017 2:31 PM    [facility] [address], [place], [place]  [address] Phone: [phone]; Fax: [phone]

## 2017-12-14 NOTE — Patient Instructions (Signed)
Your physician has recommended you make the following change in your medication:  1.) STOP metoprolol 2.) START amlodipine 2.5 mg once a day for blood pressure  Your physician recommends that you return for lab work today:  cbc/cmet/tsh/lipid  Check BP at home. Call or use MyChart to send blood pressure readings to Dr. Harrington Challenger in a few weeks.  Your physician wants you to follow-up in: 1 year with Dr. Harrington Challenger.  You will receive a reminder letter in the mail two months in advance. If you don't receive a letter, please call our office to schedule the follow-up appointment.

## 2017-12-14 NOTE — Telephone Encounter (Signed)
Patient was prescribed a zpak on 12/12/17. Since then, her stomach has been bothering her. She knows it is the zpak because the pain starts about 30-45 minutes after taking the pill.   She wants to know if there is anything else she can take in its place.   RA, please advise. Thanks!

## 2017-12-14 NOTE — Telephone Encounter (Signed)
Patient returning call, [phone]

## 2017-12-15 LAB — CBC
HEMOGLOBIN: 14.6 g/dL (ref 11.1–15.9)
Hematocrit: 42.8 % (ref 34.0–46.6)
MCH: 32.6 pg (ref 26.6–33.0)
MCHC: 34.1 g/dL (ref 31.5–35.7)
MCV: 96 fL (ref 79–97)
PLATELETS: 283 10*3/uL (ref 150–379)
RBC: 4.48 x10E6/uL (ref 3.77–5.28)
RDW: 13.1 % (ref 12.3–15.4)
WBC: 8 10*3/uL (ref 3.4–10.8)

## 2017-12-15 LAB — COMPREHENSIVE METABOLIC PANEL
ALT: 40 IU/L — AB (ref 0–32)
AST: 39 IU/L (ref 0–40)
Albumin/Globulin Ratio: 1.8 (ref 1.2–2.2)
Albumin: 4.6 g/dL (ref 3.6–4.8)
Alkaline Phosphatase: 94 IU/L (ref 39–117)
BUN/Creatinine Ratio: 12 (ref 12–28)
BUN: 9 mg/dL (ref 8–27)
Bilirubin Total: 0.2 mg/dL (ref 0.0–1.2)
CHLORIDE: 95 mmol/L — AB (ref 96–106)
CO2: 23 mmol/L (ref 20–29)
CREATININE: 0.77 mg/dL (ref 0.57–1.00)
Calcium: 9.6 mg/dL (ref 8.7–10.3)
GFR calc Af Amer: 96 mL/min/{1.73_m2} (ref >59)
GFR calc non Af Amer: 84 mL/min/{1.73_m2} (ref >59)
GLUCOSE: 79 mg/dL (ref 65–99)
Globulin, Total: 2.5 g/dL (ref 1.5–4.5)
Potassium: 4.4 mmol/L (ref 3.5–5.2)
Sodium: 134 mmol/L (ref 134–144)
Total Protein: 7.1 g/dL (ref 6.0–8.5)

## 2017-12-15 LAB — TSH: TSH: 2.99 u[IU]/mL (ref 0.450–4.500)

## 2017-12-15 LAB — LIPID PANEL
CHOLESTEROL TOTAL: 121 mg/dL (ref 100–199)
Chol/HDL Ratio: 3 ratio (ref 0.0–4.4)
HDL: 41 mg/dL (ref >39)
LDL Calculated: 60 mg/dL (ref 0–99)
TRIGLYCERIDES: 102 mg/dL (ref 0–149)
VLDL CHOLESTEROL CAL: 20 mg/dL (ref 5–40)

## 2017-12-17 ENCOUNTER — Telehealth: Payer: Self-pay | Admitting: Internal Medicine

## 2017-12-17 NOTE — Telephone Encounter (Signed)
Left voice mail to call back if she would like to discuss lab work further.  I had already left message of lab results (DPR). See lab result notes.

## 2017-12-17 NOTE — Telephone Encounter (Signed)
New message ° °Pt verbalized that she is returning call for RN °

## 2017-12-18 ENCOUNTER — Other Ambulatory Visit: Payer: Self-pay | Admitting: Internal Medicine

## 2017-12-19 ENCOUNTER — Telehealth: Payer: Self-pay | Admitting: Internal Medicine

## 2017-12-19 NOTE — Telephone Encounter (Signed)
New message    Patient calling to request lab results be mailed to her. Patient does NOT want to print from [place].

## 2017-12-19 NOTE — Telephone Encounter (Signed)
Labs printed and placed in out-going mail.

## 2017-12-23 ENCOUNTER — Other Ambulatory Visit: Payer: Self-pay | Admitting: Internal Medicine

## 2017-12-25 ENCOUNTER — Other Ambulatory Visit: Payer: Self-pay

## 2018-01-02 ENCOUNTER — Telehealth: Payer: Self-pay | Admitting: Internal Medicine

## 2018-01-02 DIAGNOSIS — I10 Essential (primary) hypertension: Secondary | ICD-10-CM

## 2018-01-02 NOTE — Telephone Encounter (Signed)
Patient decided to start spironolactone daily (taking in evening).  Started 2 days ago. Tried to stop metoprolol at same time.   Dose is 12.5 mg BID. Monday HR was 110s Took 1 dose of metoprolol Tue HR 90s--took 1 dose metoprolol Around 1:30 am this am took dose of metoprolol for HR 88, BP 148/88. Currently BP 138/74, HR 94 Advised to take dose of metoprolol tonight again (12.5 mg) and I will review with Dr. Harrington Challenger and call her tomorrow with further recommendations.

## 2018-01-02 NOTE — Telephone Encounter (Signed)
New message   Pt c/o medication issue:  1. Name of Medication:amLODipine (NORVASC) 2.5 MG tablet  2. How are you currently taking this medication (dosage and times per day)? Take 1 tablet (2.5 mg total) by mouth daily  3. Are you having a reaction (difficulty breathing--STAT)? no  4. What is your medication issue? Pt read about the amlodophione and decided not to take it, instead she took Spironolactone that gynecologist prescribed on Monday and she stopped the metopralol Pt experience a high pulse right and wants to know if its ok to take the Spironolactone

## 2018-01-07 MED ORDER — SPIRONOLACTONE 25 MG PO TABS: 25.0000 mg | ORAL_TABLET | Freq: Every day | ORAL | 3 refills | Status: DC

## 2018-01-07 MED ORDER — METOPROLOL TARTRATE 25 MG PO TABS: 12.5000 mg | ORAL_TABLET | Freq: Every day | ORAL | 3 refills | Status: DC

## 2018-01-07 NOTE — Telephone Encounter (Signed)
Pt took aldactone 50 mg last week  Pulse up Did not take aldacone this weekend because of pulse.  Recmm:  25 mg aldactone and 1/2 metoprolol daily  COme in 7 days for blood work Artist)

## 2018-01-07 NOTE — Telephone Encounter (Signed)
Updated medication list. Appointment made for BMET in 1 week.

## 2018-01-14 ENCOUNTER — Other Ambulatory Visit: Payer: BLUE CROSS/BLUE SHIELD | Admitting: *Deleted

## 2018-01-14 ENCOUNTER — Ambulatory Visit: Payer: BLUE CROSS/BLUE SHIELD | Admitting: Pulmonary Disease

## 2018-01-14 ENCOUNTER — Encounter (INDEPENDENT_AMBULATORY_CARE_PROVIDER_SITE_OTHER): Payer: Self-pay

## 2018-01-14 ENCOUNTER — Encounter: Payer: Self-pay | Admitting: Pulmonary Disease

## 2018-01-14 VITALS — BP 122/68 | HR 96 | Ht 66.0 in | Wt 140.6 lb

## 2018-01-14 DIAGNOSIS — I10 Essential (primary) hypertension: Secondary | ICD-10-CM

## 2018-01-14 DIAGNOSIS — J471 Bronchiectasis with (acute) exacerbation: Secondary | ICD-10-CM

## 2018-01-14 DIAGNOSIS — L605 Yellow nail syndrome: Secondary | ICD-10-CM

## 2018-01-14 DIAGNOSIS — J4530 Mild persistent asthma, uncomplicated: Secondary | ICD-10-CM | POA: Diagnosis not present

## 2018-01-14 DIAGNOSIS — J479 Bronchiectasis, uncomplicated: Secondary | ICD-10-CM | POA: Diagnosis not present

## 2018-01-14 NOTE — Assessment & Plan Note (Addendum)
Slight drop in lung function although remains restrictive pathology Schedule high-resolution CT of the chest We discussed airway clearance mechanisms with Mucinex -may need to add hypertonic saline nebs   Antibiotics as needed if sputum turns yellow or green

## 2018-01-14 NOTE — Assessment & Plan Note (Signed)
Etiology is unclear ? related to bronchiectasis. However no clubbing

## 2018-01-14 NOTE — Progress Notes (Signed)
   Subjective:    Patient ID: Sarah Escobar, female    DOB: 12-18-1955, 62 y.o.   MRN: [id number]  HPI  53 ex-smoker with asthmatic bronchitis & bronchiectasis, hypersensitive to perfumes. She works in Geologist, engineering at [employer].  She smoked 1 PPD for about 30-pack-years before quitting in 2003.  She has repeated sinus infections, had multiple sinus surgeries -saw ENT She had about 4 episodes of sinobronchitis of the past year requiring antibiotics , each time seems to have responded to smaller duration of antibiotics. Breathing is at baseline.  In between episodes she has minimal clear sputum. She continues to use the Nettie pot every day and feels that sinuses are the main issue.  She is also been struggling with hair loss and has taken multiple medications for that.  She reports changes in her nails, yellowing and wonders if this may be related to finasteride or her lung problems   in the past we had deferred a bronchoscopy due to Financial issues      Significant tests/ events  PFT 04/2014 - no obstruction, preserved lung volumes, DLCO 76%  CXR on 3/5 and 02/12/14 showed bilateral interstitial infiltrates-right more than left, which were persistent. HRCT 02/2014- Nonspecific pattern of interstitial lung disease with bronchiectasis, subpleural reticulation and architectural distortion. There are minor areas of alveolitis in the left upper and lower lobes Serology neg  hypersens panel neg  02/2015 CT chest >> subpleural bronchiectasis, interstitial and patchy groundglass basilar predominance. No progression noted/stable changes Previous left upper and left lower groundglass has resolved. Lack of progression and lack of honeycombing and areas of central parenchymal involvement argue against usual interstitial pneumonitis.  07/2015 CT sinuses neg  09/2015 Spirometry >> ratio 90, FEV 81%, FVC 71%  01/2018 spirometry showed slight drop in FEV1 to 70 and ratio 88>>  moderate restriction   Review of Systems neg for any significant sore throat, dysphagia, itching, sneezing, nasal congestion or excess/ purulent secretions, fever, chills, sweats, unintended wt loss, pleuritic or exertional cp, hempoptysis, orthopnea pnd or change in chronic leg swelling. Also denies presyncope, palpitations, heartburn, abdominal pain, nausea, vomiting, diarrhea or change in bowel or urinary habits, dysuria,hematuria, rash, arthralgias, visual complaints, headache, numbness weakness or ataxia.     Objective:   Physical Exam   Gen. Pleasant, well-nourished, in no distress ENT - no thrush, no post nasal drip Neck: No JVD, no thyromegaly, no carotid bruits Lungs: no use of accessory muscles, no dullness to percussion, RLL rales no rhonchi  Cardiovascular: Rhythm regular, heart sounds  normal, no murmurs or gallops, no peripheral edema Musculoskeletal: No deformities, no cyanosis or clubbing , yellow nails +         Assessment & Plan:

## 2018-01-14 NOTE — Assessment & Plan Note (Signed)
Ct breo once daily

## 2018-01-14 NOTE — Patient Instructions (Signed)
Schedule high-resolution CT of the chest  Call us for antibiotic if you have an episode of sinobronchitis

## 2018-01-15 LAB — BASIC METABOLIC PANEL
BUN / CREAT RATIO: 13 (ref 12–28)
BUN: 11 mg/dL (ref 8–27)
CO2: 24 mmol/L (ref 20–29)
CREATININE: 0.88 mg/dL (ref 0.57–1.00)
Calcium: 9.3 mg/dL (ref 8.7–10.3)
Chloride: 101 mmol/L (ref 96–106)
GFR calc Af Amer: 82 mL/min/{1.73_m2} (ref >59)
GFR, EST NON AFRICAN AMERICAN: 71 mL/min/{1.73_m2} (ref >59)
GLUCOSE: 72 mg/dL (ref 65–99)
Potassium: 4.2 mmol/L (ref 3.5–5.2)
SODIUM: 137 mmol/L (ref 134–144)

## 2018-01-17 ENCOUNTER — Telehealth: Payer: Self-pay | Admitting: Pulmonary Disease

## 2018-01-17 MED ORDER — DOXYCYCLINE HYCLATE 100 MG PO TABS: 100.0000 mg | ORAL_TABLET | Freq: Two times a day (BID) | ORAL | 0 refills | Status: DC

## 2018-01-17 MED ORDER — PREDNISONE 10 MG PO TABS: ORAL_TABLET | ORAL | 0 refills | Status: DC

## 2018-01-17 MED ORDER — GUAIFENESIN ER 600 MG PO TB12: 600.0000 mg | ORAL_TABLET | Freq: Two times a day (BID) | ORAL | 0 refills | Status: DC

## 2018-01-17 NOTE — Telephone Encounter (Signed)
Doxycycline 100 twice a day for 7 days. Mucinex 600 twice daily Prednisone 10 mg tabs  Take 2 tabs daily with food x 5ds, then 1 tab daily with food x 5ds then STOP

## 2018-01-17 NOTE — Telephone Encounter (Signed)
Called pt letting her know we were sending 3 scripts to pt's pharmacy of doxy, pred, and mucinex.  Pt expressed understanding. Stated to pt to call us if no improvement after meds.  Nothing further needed at this current time.

## 2018-01-17 NOTE — Telephone Encounter (Signed)
Called and spoke with pt who states she has been coughing with yellowish to green mucus and having some wheezing, and body aches all over.  Symptoms began yesterday.  Pt does not think she is running a temp but really does not know due to not having a thermometer.  Pt states she feels real tight in her chest.  Dr. Elsworth Soho, please advise on recommendations for pt.  Thanks!

## 2018-01-22 ENCOUNTER — Other Ambulatory Visit: Payer: BLUE CROSS/BLUE SHIELD

## 2018-01-25 ENCOUNTER — Telehealth: Payer: Self-pay | Admitting: Pulmonary Disease

## 2018-01-25 NOTE — Telephone Encounter (Signed)
Way too complicated to sort out needs over the phone, will need to go to UC or ER if can't make it to the first of the week and I can see then as work in if [place] not avail but needs to bring all meds with her

## 2018-01-25 NOTE — Telephone Encounter (Signed)
Spoke with patient. She is aware of MWs recs. Nothing else needed at time of call.  

## 2018-01-25 NOTE — Telephone Encounter (Signed)
Spoke with pt, she is taking Doxy and she feels a little better but she is still having a tight cough and doesn't feel well. She wanted to know if she should take another ABX. Her chest is really tight and she is scared to go through the weekend without anything on hand. Please advise on recommendations.   [facility] Doreene Nest, MD  to [facility] Triage [facility]     01/17/18 10:19 AM  Note     Doxycycline 100 twice a day for 7 days. Mucinex 600 twice daily Prednisone 10 mg tabs  Take 2 tabs daily with food x 5ds, then 1 tab daily with food x 5ds then STOP

## 2018-01-28 ENCOUNTER — Telehealth: Payer: Self-pay | Admitting: Pulmonary Disease

## 2018-01-28 MED ORDER — DOXYCYCLINE HYCLATE 100 MG PO TABS: 100.0000 mg | ORAL_TABLET | Freq: Two times a day (BID) | ORAL | 0 refills | Status: DC

## 2018-01-28 NOTE — Telephone Encounter (Signed)
Called pt letting her know we were sending another script of doxy to her pharmacy.  Pt expressed understanding. Verified pt's pharmacy and Rx sent.  Nothing further needed at this current time.

## 2018-01-28 NOTE — Telephone Encounter (Signed)
Called pt who states she is becoming better but thinks she might need another round of abx.  Pt was recommended by Dr. Melvyn Novas on Friday, 01/25/18 to go to either urgent care or the ED but pt stated she did not go.  Pt is still coughing up green mucus and she states this has been going on since 01/17/18. Pt was on doxy previously but finished it 2-3 days ago.  Along with coughing up green mucus, pt also has complaints of sinus congestion.

## 2018-01-28 NOTE — Telephone Encounter (Signed)
Doxycycline 100 mg twice daily for 7 more days

## 2018-02-03 ENCOUNTER — Other Ambulatory Visit: Payer: Self-pay | Admitting: Pulmonary Disease

## 2018-02-04 ENCOUNTER — Telehealth: Payer: Self-pay | Admitting: Pulmonary Disease

## 2018-02-04 NOTE — Telephone Encounter (Signed)
Called pt based on the reason why RA had scheduled her for the CT scan and she stated her CT scan has been pushed out until 02/14/18 while insurance is being worked out to see if the cost pt is needing to have for the CT is just a copay instead of the $1000 originally stated.  Pt stated she believes they have gotten things worked out with the cost but due to them still trying to figure it out is why they have rescheduled pt's scan.  Nothing further needed at this current time.

## 2018-02-05 ENCOUNTER — Inpatient Hospital Stay: Admission: RE | Admit: 2018-02-05 | Payer: BLUE CROSS/BLUE SHIELD | Source: Ambulatory Visit

## 2018-02-08 ENCOUNTER — Telehealth: Payer: Self-pay | Admitting: Pulmonary Disease

## 2018-02-08 MED ORDER — DOXYCYCLINE HYCLATE 100 MG PO TABS: 100.0000 mg | ORAL_TABLET | Freq: Two times a day (BID) | ORAL | 0 refills | Status: DC

## 2018-02-08 NOTE — Telephone Encounter (Signed)
Doxy 100 bid x 7 more days Take probiotic OTC decongestant such as walphed CXR if no better by Monday

## 2018-02-08 NOTE — Telephone Encounter (Signed)
Pt is aware of below recommendations and voiced her understanding. Rx for Doxy has been sent to preferred pharmacy. Nothing further is needed.

## 2018-02-08 NOTE — Telephone Encounter (Signed)
Pt states she completed course of doxy on Sunday. Pt states symptoms did improve with doxy but did not completely subside. Pt reports of chest congestion, sinus pressure, prod cough with yellow to green mucus & lower right side back pain x5d. Pt denies fever, chills or sweats.   RA please advise. thanks

## 2018-02-14 ENCOUNTER — Ambulatory Visit (INDEPENDENT_AMBULATORY_CARE_PROVIDER_SITE_OTHER)
Admission: RE | Admit: 2018-02-14 | Discharge: 2018-02-14 | Disposition: A | Discharge status: Home / Self Care | Payer: 59 | Source: Ambulatory Visit | Attending: Pulmonary Disease | Admitting: Pulmonary Disease

## 2018-02-14 DIAGNOSIS — J471 Bronchiectasis with (acute) exacerbation: Secondary | ICD-10-CM

## 2018-02-14 DIAGNOSIS — J479 Bronchiectasis, uncomplicated: Secondary | ICD-10-CM | POA: Diagnosis not present

## 2018-02-18 ENCOUNTER — Encounter: Payer: Self-pay | Admitting: Pulmonary Disease

## 2018-02-18 ENCOUNTER — Ambulatory Visit: Payer: 59 | Admitting: Pulmonary Disease

## 2018-02-18 DIAGNOSIS — J849 Interstitial pulmonary disease, unspecified: Secondary | ICD-10-CM

## 2018-02-18 NOTE — Patient Instructions (Signed)
Bronchoscopy scheduled for 3/26 7:45 AM at [facility]. We discussed risks and benefits.  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

## 2018-02-18 NOTE — Progress Notes (Signed)
   Subjective:    Patient ID: Sarah Escobar, female    DOB: Apr 20, 1956, 62 y.o.   MRN: [id number]  HPI  62  yo ex-smoker with asthmatic bronchitis & bronchiectasis, hypersensitive to perfumes. She works in Geologist, engineering at [employer].  She smoked 1 PPD for about 30-pack-years before quitting in 2003.  She has repeated sinus infections, had multiple sinus surgeries -saw ENT She had  4 episodes of sinobronchitis in 2018 requiring antibiotics , each time seems to have responded to smaller duration of antibiotics. Breathing is at baseline.  In between episodes she has minimal clear sputum. She continues to use the Nettie pot every day and feels that sinuses are the main issue.  She also c/o  hair loss and has taken multiple medications for that.  She reports changes in her nails, yellowing and wonders if this may be related to finasteride or her lung problems   Interim-had chest congestion and wheezing and treated with doxycycline for total of 3 coursesx 7 days each.  And one round of prednisone      Significant tests/ events  PFT 04/2014 - no obstruction, preserved lung volumes, DLCO 76%  CXR on 3/5 and 02/12/14 showed bilateral interstitial infiltrates-right more than left, which were persistent. HRCT 02/2014- Nonspecific pattern of interstitial lung disease with bronchiectasis, subpleural reticulation and architectural distortion. There are minor areas of alveolitis in the left upper and lower lobes Serology neg  hypersens panel neg  02/2015 CT chest >> subpleural bronchiectasis, interstitial and patchy groundglass basilar predominance. No progression noted/stable changes Previous left upper and left lower groundglass has resolved. Lack of progression and lack of honeycombing and areas of central parenchymal involvement argue against usual interstitial pneumonitis.  07/2015 CT sinuses neg  10/2016Spirometry >> ratio 90, FEV 81%, FVC 71%  01/2018 spirometry showed  slight drop in FEV1 to 70 and ratio 88>> moderate restriction  Review of Systems neg for any significant sore throat, dysphagia, itching, sneezing, nasal congestion or excess/ purulent secretions, fever, chills, sweats, unintended wt loss, pleuritic or exertional cp, hempoptysis, orthopnea pnd or change in chronic leg swelling.   Also denies presyncope, palpitations, heartburn, abdominal pain, nausea, vomiting, diarrhea or change in bowel or urinary habits, dysuria,hematuria, rash, arthralgias, visual complaints, headache, numbness weakness or ataxia.     Objective:   Physical Exam  Gen. Pleasant, well-nourished, in no distress ENT - no thrush, no post nasal drip Neck: No JVD, no thyromegaly, no carotid bruits Lungs: no use of accessory muscles, no dullness to percussion, bibasal rales no rhonchi  Cardiovascular: Rhythm regular, heart sounds  normal, no murmurs or gallops, no peripheral edema Musculoskeletal: No deformities, no cyanosis or clubbing , yellow nails       Assessment & Plan:

## 2018-02-18 NOTE — H&P (View-Only) (Signed)
   Subjective:    Patient ID: Sarah Escobar, female    DOB: 10/04/1956, 62 y.o.   MRN: [id number]  HPI  62  yo ex-smoker with asthmatic bronchitis & bronchiectasis, hypersensitive to perfumes. She works in Geologist, engineering at [employer].  She smoked 1 PPD for about 30-pack-years before quitting in 2003.  She has repeated sinus infections, had multiple sinus surgeries -saw ENT She had  4 episodes of sinobronchitis in 2018 requiring antibiotics , each time seems to have responded to smaller duration of antibiotics. Breathing is at baseline.  In between episodes she has minimal clear sputum. She continues to use the Nettie pot every day and feels that sinuses are the main issue.  She also c/o  hair loss and has taken multiple medications for that.  She reports changes in her nails, yellowing and wonders if this may be related to finasteride or her lung problems   Interim-had chest congestion and wheezing and treated with doxycycline for total of 3 coursesx 7 days each.  And one round of prednisone      Significant tests/ events  PFT 04/2014 - no obstruction, preserved lung volumes, DLCO 76%  CXR on 3/5 and 02/12/14 showed bilateral interstitial infiltrates-right more than left, which were persistent. HRCT 02/2014- Nonspecific pattern of interstitial lung disease with bronchiectasis, subpleural reticulation and architectural distortion. There are minor areas of alveolitis in the left upper and lower lobes Serology neg  hypersens panel neg  02/2015 CT chest >> subpleural bronchiectasis, interstitial and patchy groundglass basilar predominance. No progression noted/stable changes Previous left upper and left lower groundglass has resolved. Lack of progression and lack of honeycombing and areas of central parenchymal involvement argue against usual interstitial pneumonitis.  07/2015 CT sinuses neg  10/2016Spirometry >> ratio 90, FEV 81%, FVC 71%  01/2018 spirometry showed  slight drop in FEV1 to 70 and ratio 88>> moderate restriction  Review of Systems neg for any significant sore throat, dysphagia, itching, sneezing, nasal congestion or excess/ purulent secretions, fever, chills, sweats, unintended wt loss, pleuritic or exertional cp, hempoptysis, orthopnea pnd or change in chronic leg swelling.   Also denies presyncope, palpitations, heartburn, abdominal pain, nausea, vomiting, diarrhea or change in bowel or urinary habits, dysuria,hematuria, rash, arthralgias, visual complaints, headache, numbness weakness or ataxia.     Objective:   Physical Exam  Gen. Pleasant, well-nourished, in no distress ENT - no thrush, no post nasal drip Neck: No JVD, no thyromegaly, no carotid bruits Lungs: no use of accessory muscles, no dullness to percussion, bibasal rales no rhonchi  Cardiovascular: Rhythm regular, heart sounds  normal, no murmurs or gallops, no peripheral edema Musculoskeletal: No deformities, no cyanosis or clubbing , yellow nails       Assessment & Plan:

## 2018-02-18 NOTE — Assessment & Plan Note (Signed)
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 We discussed risks and benefits.  Based on this test, we will decide on further need for antibiotics and steroids

## 2018-02-19 ENCOUNTER — Other Ambulatory Visit: Payer: Self-pay | Admitting: Pulmonary Disease

## 2018-02-19 ENCOUNTER — Telehealth: Payer: Self-pay | Admitting: Pulmonary Disease

## 2018-02-19 NOTE — Telephone Encounter (Signed)
Spoke with RA, he wanted to see if the bronch could be scheduled for this Thursday. Spoke with Yvone Neu can not be done this Thursday.  Pt has already been scheduled for next Tuesday. Will keep this appt.   Nothing else needed at time of call.

## 2018-02-19 NOTE — Telephone Encounter (Signed)
Spoke with pt, she stated she can have the bronchoscopy on Thursday if RA agrees. Please advise as soon as possible so her husband can take off. RA please advise.

## 2018-02-25 ENCOUNTER — Telehealth: Payer: Self-pay | Admitting: Pulmonary Disease

## 2018-02-25 NOTE — Telephone Encounter (Signed)
Patient calling back about bronch. See previous message closed.  States she was called and told she needs to reschedule this.  Do not see this, Tues appt showing cancelled.  She was told to call the office.  CB is [phone].

## 2018-02-25 NOTE — Telephone Encounter (Signed)
Spoke with patient. She was concerned because she was supposed to have a Visual merchandiser. Advised patient that RA had to cancel this due a scheduling conflict.   Spoke with RA via text. He would like for the patient to be scheduled for 03/04/18. Spoke with Baxter Flattery at [facility], appt has been made.   Spoke again with patient. She is aware of new appt. Advised her that someone from the hospital would call her either Thursday or Friday with more information.   Nothing else needed at time of call.

## 2018-02-26 ENCOUNTER — Inpatient Hospital Stay (HOSPITAL_COMMUNITY): Admission: RE | Admit: 2018-02-26 | Payer: 59 | Source: Ambulatory Visit

## 2018-02-26 ENCOUNTER — Encounter (HOSPITAL_COMMUNITY): Admission: RE | Payer: Self-pay | Source: Ambulatory Visit

## 2018-02-26 ENCOUNTER — Ambulatory Visit (HOSPITAL_COMMUNITY): Admission: RE | Admit: 2018-02-26 | Payer: 59 | Source: Ambulatory Visit | Admitting: Pulmonary Disease

## 2018-02-26 SURGERY — BRONCHOSCOPY, WITH FLUOROSCOPY
Anesthesia: Moderate Sedation | Laterality: Bilateral

## 2018-03-01 ENCOUNTER — Telehealth: Payer: Self-pay | Admitting: Internal Medicine

## 2018-03-01 ENCOUNTER — Telehealth: Payer: Self-pay | Admitting: Pulmonary Disease

## 2018-03-01 NOTE — Telephone Encounter (Signed)
Was transferred to patient via [place]. Patient called and was concerned about her bronch scheduled on Friday. She stated that no one has called to confirm her appt nor told her when to stop taking her medications. She also stated that she called central scheduling and they told her they do not have anything scheduled for her on Monday.   Left a message for Baxter Flattery to call back to see what is going on.

## 2018-03-01 NOTE — Telephone Encounter (Signed)
See other phone note from 3/29

## 2018-03-04 ENCOUNTER — Encounter (HOSPITAL_COMMUNITY): Payer: Self-pay | Admitting: Respiratory Therapy

## 2018-03-04 ENCOUNTER — Ambulatory Visit (HOSPITAL_COMMUNITY)
Admission: RE | Admit: 2018-03-04 | Discharge: 2018-03-04 | Disposition: A | Discharge status: Home / Self Care | Payer: 59 | Source: Ambulatory Visit | Attending: Pulmonary Disease | Admitting: Pulmonary Disease

## 2018-03-04 ENCOUNTER — Encounter (HOSPITAL_COMMUNITY)
Admission: RE | Disposition: A | Discharge status: Home / Self Care | Payer: Self-pay | Source: Ambulatory Visit | Attending: Pulmonary Disease

## 2018-03-04 ENCOUNTER — Ambulatory Visit (HOSPITAL_COMMUNITY): Payer: 59

## 2018-03-04 DIAGNOSIS — R918 Other nonspecific abnormal finding of lung field: Secondary | ICD-10-CM | POA: Diagnosis not present

## 2018-03-04 DIAGNOSIS — J849 Interstitial pulmonary disease, unspecified: Secondary | ICD-10-CM | POA: Diagnosis not present

## 2018-03-04 DIAGNOSIS — J470 Bronchiectasis with acute lower respiratory infection: Secondary | ICD-10-CM | POA: Insufficient documentation

## 2018-03-04 DIAGNOSIS — J45909 Unspecified asthma, uncomplicated: Secondary | ICD-10-CM | POA: Insufficient documentation

## 2018-03-04 DIAGNOSIS — R848 Other abnormal findings in specimens from respiratory organs and thorax: Secondary | ICD-10-CM | POA: Diagnosis not present

## 2018-03-04 DIAGNOSIS — J984 Other disorders of lung: Secondary | ICD-10-CM | POA: Diagnosis not present

## 2018-03-04 DIAGNOSIS — Z9889 Other specified postprocedural states: Secondary | ICD-10-CM

## 2018-03-04 DIAGNOSIS — Z87891 Personal history of nicotine dependence: Secondary | ICD-10-CM | POA: Insufficient documentation

## 2018-03-04 HISTORY — PX: VIDEO BRONCHOSCOPY: SHX5072

## 2018-03-04 LAB — BODY FLUID CELL COUNT WITH DIFFERENTIAL
LYMPHS FL: 2 %
MONOCYTE-MACROPHAGE-SEROUS FLUID: 4 % — AB (ref 50–90)
NEUTROPHIL FLUID: 94 % — AB (ref 0–25)
Total Nucleated Cell Count, Fluid: 630 cu mm (ref 0–1000)

## 2018-03-04 SURGERY — BRONCHOSCOPY, WITH FLUOROSCOPY
Anesthesia: Moderate Sedation | Laterality: Bilateral

## 2018-03-04 MED ORDER — MIDAZOLAM HCL 5 MG/ML IJ SOLN
INTRAMUSCULAR | Status: AC
  Filled 2018-03-04: qty 2

## 2018-03-04 MED ORDER — MIDAZOLAM HCL 10 MG/2ML IJ SOLN
INTRAMUSCULAR | Status: DC | PRN
  Administered 2018-03-04 (×3): 1 mg via INTRAVENOUS

## 2018-03-04 MED ORDER — FENTANYL CITRATE (PF) 100 MCG/2ML IJ SOLN
INTRAMUSCULAR | Status: DC | PRN
  Administered 2018-03-04 (×3): 25 ug via INTRAVENOUS

## 2018-03-04 MED ORDER — FENTANYL CITRATE (PF) 100 MCG/2ML IJ SOLN
INTRAMUSCULAR | Status: AC
  Filled 2018-03-04: qty 4

## 2018-03-04 MED ORDER — LIDOCAINE HCL 2 % EX GEL
CUTANEOUS | Status: DC | PRN
  Administered 2018-03-04: 1

## 2018-03-04 MED ORDER — SODIUM CHLORIDE 0.9 % IV SOLN
INTRAVENOUS | Status: DC
  Administered 2018-03-04: 08:00:00 via INTRAVENOUS

## 2018-03-04 MED ORDER — LIDOCAINE HCL 1 % IJ SOLN
INTRAMUSCULAR | Status: DC | PRN
  Administered 2018-03-04: 6 mL via RESPIRATORY_TRACT

## 2018-03-04 MED ORDER — PHENYLEPHRINE HCL 0.25 % NA SOLN
NASAL | Status: DC | PRN
  Administered 2018-03-04: 2 via NASAL

## 2018-03-04 MED ORDER — LIDOCAINE HCL 2 % EX GEL: 1.0000 "application " | Freq: Once | CUTANEOUS | Status: DC

## 2018-03-04 NOTE — Discharge Instructions (Signed)
Flexible Bronchoscopy, Care After These instructions give you information on caring for yourself after your procedure. Your doctor may also give you more specific instructions. Call your doctor if you have any problems or questions after your procedure. Follow these instructions at home:  Do not eat or drink anything for 2 hours after your procedure. If you try to eat or drink before the medicine wears off, food or drink could go into your lungs. You could also burn yourself.  After 2 hours have passed and when you can cough and gag normally, you may eat soft food and drink liquids slowly.  The day after the test, you may eat your normal diet.  You may do your normal activities.  Keep all doctor visits. Get help right away if:  You get more and more short of breath.  You get light-headed.  You feel like you are going to pass out (faint).  You have chest pain.  You have new problems that worry you.  You cough up more than a little blood.  You cough up more blood than before. This information is not intended to replace advice given to you by your health care provider. Make sure you discuss any questions you have with your health care provider. Document Released: 09/17/2009 Document Revised: 04/27/2016 Document Reviewed: 07/25/2013 Elsevier Interactive Patient Education  2017 [place].  Nothing to eat or drink until  10:30    am  today   03/04/2018 Any questions or concerns please call the office at [phone]

## 2018-03-04 NOTE — Interval H&P Note (Signed)
History and Physical Interval Note:  03/04/2018 8:12 AM  Sarah Escobar  has presented today for surgery, with the diagnosis of ILD  The various methods of treatment have been discussed with the patient and family. After consideration of risks, benefits and other options for treatment, the patient has consented to  Procedure(s): VIDEO BRONCHOSCOPY WITH FLUORO (Bilateral) as a surgical intervention .  The patient's history has been reviewed, patient examined, no change in status, stable for surgery.  I have reviewed the patient's chart and labs.  Questions were answered to the patient's satisfaction.     Leanna Sato [place]

## 2018-03-04 NOTE — Op Note (Signed)
Indication: Bilateral unexplained pulmonary infiltrates in the 62 -year-old never smoker, ILD , clinically s/o hypersensitivity pneumonitis  Written informed consent was obtained from the patient prior to the procedure. The risks of the procedure including coughing, bleeding and a small chance of lung cancer requiring a chest tube were discussed with the patient in great detail and evidenced understanding.  3 mg of Versed and  75 mcg of fentanyl were used in divided doses during the procedure. Bronchoscope was inserted from the right Nare. The upper airway appeared normal. Vocal cord showed normal appearance in motion. The trachea bronchial tree was then examined to the subsegmental level. Mild white secretions were noted. No endobronchial lesions were noted.  Attention was then turned to the right lower lobe. Bronchoalveolar lavage was obtained from the right lower lobe with good return. Transbronchial biopsies x 4 were obtained from the different subsegments of the right lower lobe. The patient tolerated procedure well with minimal bleeding.  A portable chest X. will be performed to rule out presence of pneumothorax. She was awake and alert in the end of the procedure.  Kara Mead MD. Shade Flood. [facility] Pager [phone] If no response call [phone]   03/04/2018

## 2018-03-04 NOTE — Progress Notes (Signed)
Video Bronchoscopy done  Intervention Bronchial washing done Intervention Bronchial Biopsy done  Scope in at [place] [facility] [place] medication 0811 last medication [phone]

## 2018-03-05 LAB — ACID FAST SMEAR (AFB)

## 2018-03-05 LAB — ACID FAST SMEAR (AFB, MYCOBACTERIA): Acid Fast Smear: NEGATIVE

## 2018-03-06 LAB — CULTURE, BAL-QUANTITATIVE W GRAM STAIN

## 2018-03-06 LAB — CULTURE, BAL-QUANTITATIVE: CULTURE: NO GROWTH

## 2018-03-07 ENCOUNTER — Telehealth: Payer: Self-pay | Admitting: Pulmonary Disease

## 2018-03-07 NOTE — Telephone Encounter (Signed)
Notes recorded by Rigoberto Noel, MD on 03/06/2018 at 5:17 PM EDT Cultures so far negative and biopsies showed inflammation-no specific cause identified Does not need more antibiotics at this time  Spoke with patient. She wanted to have a copy of results mailed to her. Verified address over the phone.   Nothing else needed at time of call.

## 2018-03-25 ENCOUNTER — Other Ambulatory Visit: Payer: Self-pay | Admitting: Internal Medicine

## 2018-03-26 ENCOUNTER — Telehealth: Payer: Self-pay | Admitting: Pulmonary Disease

## 2018-03-26 MED ORDER — FLUTICASONE FUROATE-VILANTEROL 100-25 MCG/INH IN AEPB: INHALATION_SPRAY | RESPIRATORY_TRACT | 3 refills | Status: AC

## 2018-03-26 NOTE — Telephone Encounter (Signed)
rx called in  to [facility].  Nothing further needed.

## 2018-04-02 LAB — FUNGUS CULTURE WITH STAIN

## 2018-04-02 LAB — FUNGUS CULTURE RESULT

## 2018-04-02 LAB — FUNGAL ORGANISM REFLEX

## 2018-04-11 ENCOUNTER — Ambulatory Visit: Payer: 59 | Admitting: Pulmonary Disease

## 2018-04-11 ENCOUNTER — Other Ambulatory Visit: Payer: 59

## 2018-04-11 ENCOUNTER — Encounter: Payer: Self-pay | Admitting: Pulmonary Disease

## 2018-04-11 VITALS — BP 118/72 | HR 78 | Ht 66.0 in | Wt 136.0 lb

## 2018-04-11 DIAGNOSIS — J45909 Unspecified asthma, uncomplicated: Secondary | ICD-10-CM | POA: Diagnosis not present

## 2018-04-11 DIAGNOSIS — J849 Interstitial pulmonary disease, unspecified: Secondary | ICD-10-CM

## 2018-04-11 MED ORDER — FLUTTER DEVI: 0 refills | Status: AC

## 2018-04-11 NOTE — Assessment & Plan Note (Signed)
Flutter valve - do not send Rx to [facility] Continue nettie pot  Ct breo

## 2018-04-11 NOTE — Assessment & Plan Note (Addendum)
Presumptive diagnosis is chronic hypersensitivity pneumonitis, combination of some yellowing of her nails raises the question of yellow nail syndrome with sinusitis & bronchiectasis Schedule PFTs next visit Blood work - ILD panel Await derm input on nails

## 2018-04-11 NOTE — Patient Instructions (Addendum)
Flutter valve - do not send Rx to [facility] Continue nettie pot Schedule PFTs next visit Blood work - ILD panel

## 2018-04-11 NOTE — Progress Notes (Signed)
   Subjective:    Patient ID: Sarah Escobar, female    DOB: 12-29-1955, 62 y.o.   MRN: [id number]  HPI 63 yo ex-smoker with asthmatic bronchitis & bronchiectasis, hypersensitive to perfumes. She works in Geologist, engineering at [employer].  She smoked 1 PPD for about 30-pack-years before quitting in 2003. She hasrepeated sinus infections, had multiple sinus surgeries -saw ENT She had  4 episodes of sinobronchitis in 2018 requiring antibiotics Other issues include hair loss and yellowing of her nails  We discussed bronchoscopy results, transbronchial biopsy showing chronic inflammation, AFB and fungal cultures negative.  She feels good on this visit, breathing is at her baseline, she continues to use Nettie pot for her sinuses    Significant tests/ events  PFT 04/2014 - no obstruction, preserved lung volumes, DLCO 76%  CXR on 3/5 and 02/12/14 showed bilateral interstitial infiltrates-right more than left, which were persistent. HRCT 02/2014- Nonspecific pattern of interstitial lung disease with bronchiectasis, subpleural reticulation and architectural distortion. There are minor areas of alveolitis in the left upper and lower lobes Serology neg  hypersens panel neg  02/2015 CT chest >>subpleural bronchiectasis, interstitial and patchy groundglass basilar predominance. No progression noted/stable changes Previous left upper and left lower groundglass has resolved. Lack of progression and lack of honeycombing and areas of central parenchymal involvement argue against usual interstitial pneumonitis.  07/2015 CT sinuses neg  10/2016Spirometry >> ratio 90, FEV 81%, FVC 71%  2/2019spirometry showed slight drop in FEV1 to 70 and ratio 88>>moderate restriction  02/2018 HRCT >> slight worse,favor chr hypersens pneumonitis, NOT UIP Bronchoscopy 03/2018 >> TBBC - chro inflamm, rest neg  Past Medical History:  Diagnosis Date  . Allergy   . Asthma    adult onset  . Depression     . Hyperlipidemia    LDL goal = < 100  . Hypertension   . Osteopenia    mild  . Personal history of adenomatous colonic polyps/FHx colon cancer sister and father    [name]  . Sciatica of left side    intermittent     Review of Systems neg for any significant sore throat, dysphagia, itching, sneezing, nasal congestion or excess/ purulent secretions, fever, chills, sweats, unintended wt loss, pleuritic or exertional cp, hempoptysis, orthopnea pnd or change in chronic leg swelling. Also denies presyncope, palpitations, heartburn, abdominal pain, nausea, vomiting, diarrhea or change in bowel or urinary habits, dysuria,hematuria, rash, arthralgias, visual complaints, headache, numbness weakness or ataxia.      Objective:   Physical Exam   Gen. Pleasant, well-nourished, in no distress ENT - no thrush, no post nasal drip Neck: No JVD, no thyromegaly, no carotid bruits Lungs: no use of accessory muscles, no dullness to percussion, fine bibasal  rales no rhonchi  Cardiovascular: Rhythm regular, heart sounds  normal, no murmurs or gallops, no peripheral edema Musculoskeletal: No deformities, no cyanosis or clubbing , yellow nails        Assessment & Plan:

## 2018-04-13 ENCOUNTER — Other Ambulatory Visit: Payer: Self-pay | Admitting: Pulmonary Disease

## 2018-04-15 LAB — HYPERSENSITIVITY PNEUMONITIS
A. PULLULANS ABS: NEGATIVE
A.Fumigatus #1 Abs: NEGATIVE
Micropolyspora faeni, IgG: NEGATIVE
PIGEON SERUM ABS: NEGATIVE
THERMOACT. SACCHARII: NEGATIVE
THERMOACTINOMYCES VULGARIS IGG: NEGATIVE

## 2018-04-15 LAB — CK TOTAL AND CKMB (NOT AT ARMC)
CK, MB: 6.9 ng/mL — ABNORMAL HIGH (ref 0–5.0)
Relative Index: 1.5 (ref 0–4.0)
Total CK: 448 U/L — ABNORMAL HIGH (ref 29–143)

## 2018-04-15 LAB — ALDOLASE: ALDOLASE: 7.3 U/L (ref <=8.1)

## 2018-04-15 LAB — ANTI-JO 1 ANTIBODY, IGG: Anti JO-1: <0.2 AI (ref 0.0–0.9)

## 2018-04-16 LAB — ACID FAST CULTURE WITH REFLEXED SENSITIVITIES: ACID FAST CULTURE - AFSCU3: NEGATIVE

## 2018-04-16 LAB — ACID FAST CULTURE WITH REFLEXED SENSITIVITIES (MYCOBACTERIA)

## 2018-04-18 DIAGNOSIS — L603 Nail dystrophy: Secondary | ICD-10-CM | POA: Diagnosis not present

## 2018-04-18 DIAGNOSIS — L821 Other seborrheic keratosis: Secondary | ICD-10-CM | POA: Diagnosis not present

## 2018-04-19 ENCOUNTER — Telehealth: Payer: Self-pay | Admitting: Pulmonary Disease

## 2018-04-19 NOTE — Telephone Encounter (Signed)
Called and spoke with patient. Advised her that we have not received the results yet but will send a message to the doctor and call her once we get them.    RA please advise on this, thank you.

## 2018-04-19 NOTE — Telephone Encounter (Signed)
Advised pt of results. Pt understood and nothing further is needed.   Results mailed to her address.

## 2018-04-19 NOTE — Telephone Encounter (Signed)
Slight high muscle enzymes.  Rest negative No clear significance

## 2018-04-25 DIAGNOSIS — L649 Androgenic alopecia, unspecified: Secondary | ICD-10-CM | POA: Diagnosis not present

## 2018-04-25 DIAGNOSIS — L089 Local infection of the skin and subcutaneous tissue, unspecified: Secondary | ICD-10-CM | POA: Diagnosis not present

## 2018-04-25 DIAGNOSIS — L650 Telogen effluvium: Secondary | ICD-10-CM | POA: Diagnosis not present

## 2018-05-14 ENCOUNTER — Telehealth: Payer: Self-pay | Admitting: Pulmonary Disease

## 2018-05-14 MED ORDER — CEFDINIR 300 MG PO CAPS: 300.0000 mg | ORAL_CAPSULE | Freq: Two times a day (BID) | ORAL | 0 refills | Status: DC

## 2018-05-14 NOTE — Telephone Encounter (Signed)
omnicef 300 bid  X 7 days

## 2018-05-14 NOTE — Telephone Encounter (Signed)
Spoke with the pt and notified of recs per RA  She verbalized understanding  Rx was sent to pharm

## 2018-05-14 NOTE — Telephone Encounter (Signed)
Spoke with the pt  She c/o sore throat, white patched on throat, wheezing and cough with green to yellow sputum x 6 days  She denies any f/c/s, CP, SOB  She was offered appt and declined  Please advise thanks!  Allergies  Allergen Reactions  . Flagyl [Metronidazole] Other (See Comments)    Nerve pain  . Levofloxacin Rash  . Loperamide Hcl Rash  . Neomycin-Bacitracin Zn-Polymyx Rash  . Ofloxacin Rash  . Sulfonamide Derivatives Hives  . Band-Aid Liquid Bandage [New Skin] Other (See Comments)    Regular band-aid cause itching and redness  . Collodion Dermatitis  . Gabapentin Other (See Comments)    Mental status changes  . Latex Other (See Comments) and Dermatitis    "redness"  . Sulfa Antibiotics Hives  . Oxycontin [Oxycodone Hcl] Rash

## 2018-05-21 ENCOUNTER — Other Ambulatory Visit: Payer: Self-pay | Admitting: Internal Medicine

## 2018-05-30 ENCOUNTER — Telehealth: Payer: Self-pay | Admitting: Pulmonary Disease

## 2018-05-30 MED ORDER — DOXYCYCLINE HYCLATE 100 MG PO TABS: 100.0000 mg | ORAL_TABLET | Freq: Two times a day (BID) | ORAL | 0 refills | Status: DC

## 2018-05-30 NOTE — Telephone Encounter (Signed)
Called and spoke with patient regarding not feeling well in last month.  Pt reports sore throat, white spots on throat, redness, postnasal drip, chills, hoarseness, congestion, sinus pressure, and productive cough-yellow/green, and wheezing that has increased in last 6 days. Pt last ov with RA 04/11/18; Pt ended last antibiotic on 05/20/2018. Offered pt a acute ov today and tomorrow, pt denies. Pt requesting something be called in if possible.  RA please advise.

## 2018-05-30 NOTE — Telephone Encounter (Signed)
RA, please advise.   The pt has some prednisone at home but it is out of date from oct 2018.  Do you want to send in a pred taper, and if so what dose?  The doxy has already been sent to the pharmacy.   Thanks

## 2018-05-30 NOTE — Telephone Encounter (Signed)
Prednisone 10 mg tabs  Take 2 tabs daily with food x 5ds, then 1 tab daily with food x 5ds then STOP  

## 2018-05-30 NOTE — Telephone Encounter (Signed)
Okay to take prednisone. Doxycycline 100 twice daily for 7 days Office visit if no better by next week

## 2018-05-30 NOTE — Telephone Encounter (Signed)
LMOM TCB x1 We do not have authorization to leave a detailed message on the number provided

## 2018-05-31 MED ORDER — PREDNISONE 10 MG PO TABS: ORAL_TABLET | ORAL | 0 refills | Status: DC

## 2018-05-31 NOTE — Telephone Encounter (Signed)
Patient returned call, CB is [phone]. She was advised detailed msg could not be left and please stay by the phone.

## 2018-05-31 NOTE — Telephone Encounter (Signed)
Called spoke with patient, discussed RA's recommendations as stated below Rx sent to verified pharmacy Pt aware to contact the office if symptome do not improve or they worsen  Nothing further needed; will sign off

## 2018-06-07 ENCOUNTER — Encounter: Payer: Self-pay | Admitting: Primary Care

## 2018-06-07 ENCOUNTER — Ambulatory Visit: Payer: 59 | Admitting: Primary Care

## 2018-06-07 DIAGNOSIS — J028 Acute pharyngitis due to other specified organisms: Secondary | ICD-10-CM

## 2018-06-07 DIAGNOSIS — J4530 Mild persistent asthma, uncomplicated: Secondary | ICD-10-CM

## 2018-06-07 DIAGNOSIS — J0101 Acute recurrent maxillary sinusitis: Secondary | ICD-10-CM | POA: Diagnosis not present

## 2018-06-07 DIAGNOSIS — J029 Acute pharyngitis, unspecified: Secondary | ICD-10-CM | POA: Insufficient documentation

## 2018-06-07 MED ORDER — AMOXICILLIN-POT CLAVULANATE 875-125 MG PO TABS: 1.0000 | ORAL_TABLET | Freq: Two times a day (BID) | ORAL | 0 refills | Status: AC

## 2018-06-07 NOTE — Assessment & Plan Note (Signed)
New; RX Augmentin sent. Continue flonase, allegra daily and netti pot. Return if no improvement in 10 days

## 2018-06-07 NOTE — Assessment & Plan Note (Signed)
Posterior pharynx with erythema, likely r/t post nasal drip. No tonsillar adenopathy or white spots on exam. Rec tylenol and salt water gargle.

## 2018-06-07 NOTE — Progress Notes (Signed)
@Patient  ID: Sarah Escobar, female    DOB: 02/02/1956, 62 y.o.   MRN: [id number]  Chief Complaint  Patient presents with  . Acute Visit    Sore thoart, sinus drainage     Referring provider: Plotnikov, Evie Lacks, MD  HPI: 62 year old female hx ILD, bronchiectasis, asthma. Follows with Dr. Elsworth Soho.   Patient called pulmonary office twice over the past 2-3 weeks with complaints of sore throat, sinus congestion, productive cough with green/yellow sputum and wheezes. She was initially started on Omnicef with no improvement in symptoms, she was then given a course of doxycycline and prednisone.   06/07/2018 Presents today with continued nasal congestion and sinus pressure/pain for 3-4 weeks. Associated sore throat, worse in the morning. She continues flonase and Human resources officer daily. She uses a netti pot twice daily, getting up thick yellow/green mucus. Denies cough, sob or wheeze.     Allergies  Allergen Reactions  . Flagyl [Metronidazole] Other (See Comments)    Nerve pain  . Levofloxacin Rash  . Loperamide Hcl Rash  . Neomycin-Bacitracin Zn-Polymyx Rash  . Ofloxacin Rash  . Sulfonamide Derivatives Hives  . Band-Aid Liquid Bandage [New Skin] Other (See Comments)    Regular band-aid cause itching and redness  . Collodion Dermatitis  . Gabapentin Other (See Comments)    Mental status changes  . Latex Other (See Comments) and Dermatitis    "redness"  . Sulfa Antibiotics Hives  . Oxycontin [Oxycodone Hcl] Rash         Immunization History  Administered Date(s) Administered  . Influenza Whole 09/09/2008, 09/10/2009, 09/03/2012  . Influenza,inj,Quad PF,6+ Mos 08/25/2014  . Influenza-Unspecified 09/03/2013, 09/09/2015, 09/06/2016  . Pneumococcal Conjugate-13 08/25/2014  . Pneumococcal Polysaccharide-23 01/14/2007, 10/31/2017  . Tdap 10/29/2013    Past Medical History:  Diagnosis Date  . Allergy   . Asthma    adult onset  . Depression   . Hyperlipidemia    LDL goal = < 100  .  Hypertension   . Osteopenia    mild  . Personal history of adenomatous colonic polyps/FHx colon cancer sister and father    [name]  . Sciatica of left side    intermittent    Tobacco History: Social History   Tobacco Use  Smoking Status Former Smoker  . Packs/day: 1.00  . Years: 30.00  . Pack years: 30.00  . Types: Cigarettes  . Last attempt to quit: 12/04/2001  . Years since quitting: 16.5  Smokeless Tobacco Never Used  Tobacco Comment    smoked 1973-2003, up to 1.5 ppd   Counseling given: Not Answered Comment:  smoked 1973-2003, up to 1.5 ppd   Outpatient Medications Prior to Visit  Medication Sig Dispense Refill  . albuterol (PROVENTIL) (2.5 MG/3ML) 0.083% nebulizer solution INHALE CONTENTS OF ONE VIAL VIA NEBULIZER EVERY 4 HOURS AS NEEDED 300 mL 0  . Biotin 5000 MCG CAPS Take 5,000 mcg by mouth daily.     Marland Kitchen buPROPion (WELLBUTRIN XL) 300 MG 24 hr tablet Take 1 tablet (300 mg total) by mouth every morning. Patient needs office visit before refills will be given 90 tablet 0  . Cholecalciferol 2000 units TABS Take 2,000 Units by mouth daily.     Marland Kitchen doxycycline (VIBRA-TABS) 100 MG tablet Take 1 tablet (100 mg total) by mouth 2 (two) times daily. 14 tablet 0  . estrogen-methylTESTOSTERone 0.625-1.25 MG tablet Take 1 tablet by mouth daily.    . feeding supplement, ENSURE ENLIVE, (ENSURE ENLIVE) LIQD Take 237 mLs  by mouth 2 (two) times daily between meals. (Patient taking differently: Take 237 mLs by mouth 3 (three) times a week. ) 237 mL 12  . fexofenadine (ALLEGRA) 180 MG tablet Take 180 mg by mouth daily.    . finasteride (PROSCAR) 5 MG tablet Take 2.5 mg by mouth daily.     . Flaxseed, Linseed, (FLAXSEED OIL) 1000 MG CAPS Take 1,000 mg by mouth 2 (two) times daily.     . fluticasone (FLONASE) 50 MCG/ACT nasal spray USE 1 SPRAY IN EACH NOSTRIL TWO TIMES A DAY AS NEEDED (Patient taking differently: USE 1 SPRAY IN EACH NOSTRIL TWO TIMES A DAY) 48 g 3  . fluticasone  furoate-vilanterol (BREO ELLIPTA) 100-25 MCG/INH AEPB USE 1 INHALATION DAILY 180 each 3  . guaiFENesin (MUCINEX) 600 MG 12 hr tablet Take 1 tablet (600 mg total) by mouth 2 (two) times daily. 60 tablet 0  . ibuprofen (ADVIL,MOTRIN) 200 MG tablet Take 400 mg by mouth every 6 (six) hours as needed for headache or moderate pain.    . Ipratropium-Albuterol (COMBIVENT RESPIMAT) 20-100 MCG/ACT AERS respimat Inhale 1 puff into the lungs 4 (four) times daily as needed for wheezing. 3 Inhaler 3  . ipratropium-albuterol (DUONEB) 0.5-2.5 (3) MG/3ML SOLN INHALE CONTENTS OF 1 VIAL INTO THE LUNGS VIA NEBULIZER EVERY 6 (SIX) HOURS AS NEEDED 360 mL 2  . LORazepam (ATIVAN) 0.5 MG tablet TAKE 1 TO 2 TABLETS BY MOUTH AT BEDTIME AS NEEDED FOR ANXIETY (Patient taking differently: TAKE 0.5-1 MG BY MOUTH AT BEDTIME AS NEEDED FOR ANXIETY) 60 tablet 2  . metoprolol tartrate (LOPRESSOR) 25 MG tablet Take 0.5 tablets (12.5 mg total) by mouth daily. (Patient taking differently: Take 12.5 mg by mouth 2 (two) times daily. ) 45 tablet 3  . minoxidil (ROGAINE) 2 % external solution Apply 1 application topically as needed (for hair loss).     . Multiple Vitamins-Minerals (ONE-A-DAY 50 PLUS PO) Take 1 tablet by mouth daily.     Marland Kitchen OVER THE COUNTER MEDICATION Take 1 tablet by mouth 2 (two) times daily. Nutrafol Supplement    . Respiratory Therapy Supplies (FLUTTER) DEVI Use a directed. 1 each 0  . simvastatin (ZOCOR) 20 MG tablet TAKE 1 TABLET BY MOUTH  DAILY AT 6 PM. (Patient taking differently: TAKE 20 MG BY MOUTH  DAILY AT BEDTIME) 90 tablet 3  . UNABLE TO FIND Take 1 scoop by mouth daily. Med Name: Collagen Peptides     . predniSONE (DELTASONE) 10 MG tablet Take 2 tabs daily with food x5 days, then 1 tab daily with food x5 days, then stop (Patient not taking: Reported on 06/07/2018) 15 tablet 0  . cefdinir (OMNICEF) 300 MG capsule Take 1 capsule (300 mg total) by mouth 2 (two) times daily. 14 capsule 0   No facility-administered  medications prior to visit.       Review of Systems  Review of Systems  HENT: Positive for congestion, sinus pressure, sinus pain and sore throat.   Respiratory: Negative for cough, shortness of breath and wheezing.     Physical Exam  BP 110/70 (BP Location: Left Arm, Cuff Size: Normal)   Pulse 71   Ht 5\' 6"  (1.676 m)   Wt 137 lb 9.6 oz (62.4 kg)   SpO2 96%   BMI 22.21 kg/m  Physical Exam  Constitutional: She is oriented to person, place, and time. She appears well-developed and well-nourished.  HENT:  Right Ear: Tympanic membrane normal.  Left Ear: Tympanic membrane normal.  Nose: Right sinus exhibits maxillary sinus tenderness. Left sinus exhibits maxillary sinus tenderness.  Mouth/Throat: Uvula is midline. Posterior oropharyngeal erythema present.  Eyes: Pupils are equal, round, and reactive to light. EOM are normal.  Neck: Normal range of motion. Neck supple.  Cardiovascular: Normal rate and regular rhythm.  Pulmonary/Chest: Effort normal. No accessory muscle usage or stridor. No tachypnea. No respiratory distress. She has no decreased breath sounds. She has rales in the right lower field and the left lower field.  Neurological: She is alert and oriented to person, place, and time.  Skin: Skin is warm, dry and intact.  Psychiatric: She has a normal mood and affect. Her speech is normal and behavior is normal.     Lab Results:  Imaging: No results found.   Assessment & Plan:   Sinusitis, acute maxillary New; RX Augmentin sent. Continue flonase, allegra daily and netti pot. Return if no improvement in 10 days  Asthmatic bronchitis Breathing appears stable today. No sob, wheeze or cough.   Continue Breo, duoneb prn  Acute pharyngitis Posterior pharynx with erythema, likely r/t post nasal drip. No tonsillar adenopathy or white spots on exam. Rec tylenol and salt water gargle.      Martyn Ehrich, NP 06/07/2018

## 2018-06-07 NOTE — Patient Instructions (Signed)
Sending Augmentin to pharmacy  Continues flonase, allegra, nasal saline rinse and mucinex   Warm compresses, warm showers, humidification, drink plenty of fluids, and good hand hygiene  Return if not better in 10 days, develop fever >101, worsening cough/shortness of breath

## 2018-06-07 NOTE — Assessment & Plan Note (Signed)
Breathing appears stable today. No sob, wheeze or cough.   Continue Breo, duoneb prn

## 2018-07-05 ENCOUNTER — Other Ambulatory Visit: Payer: Self-pay | Admitting: Internal Medicine

## 2018-07-22 ENCOUNTER — Other Ambulatory Visit: Payer: Self-pay | Admitting: Pulmonary Disease

## 2018-08-06 DIAGNOSIS — Z1231 Encounter for screening mammogram for malignant neoplasm of breast: Secondary | ICD-10-CM | POA: Diagnosis not present

## 2018-08-06 DIAGNOSIS — Z01419 Encounter for gynecological examination (general) (routine) without abnormal findings: Secondary | ICD-10-CM | POA: Diagnosis not present

## 2018-08-07 ENCOUNTER — Other Ambulatory Visit: Payer: Self-pay | Admitting: Obstetrics and Gynecology

## 2018-08-07 DIAGNOSIS — R928 Other abnormal and inconclusive findings on diagnostic imaging of breast: Secondary | ICD-10-CM

## 2018-08-08 ENCOUNTER — Telehealth: Payer: Self-pay | Admitting: Pulmonary Disease

## 2018-08-08 MED ORDER — DOXYCYCLINE HYCLATE 100 MG PO TABS: 100.0000 mg | ORAL_TABLET | Freq: Two times a day (BID) | ORAL | 0 refills | Status: AC

## 2018-08-08 NOTE — Telephone Encounter (Signed)
Please have her take doxycycline 100mg bid x 7 days 

## 2018-08-08 NOTE — Telephone Encounter (Signed)
Spoke with pt, she states the last few nights she has been coughing and it is keeping her up at night. She is using her netty pot which brings up yellow mucus from her sinus and chest. She is very fatigue and having chills. She took a Zpak in the middle of August and felt better but she is afraid she is developing another infection. She is going out of town tomorrow and would like to have something on hand just in case. Dr. Elsworth Soho is on vacation, DOD please advise.    [facility]   Current Outpatient Medications on File Prior to Visit  Medication Sig Dispense Refill  . albuterol (PROVENTIL) (2.5 MG/3ML) 0.083% nebulizer solution INHALE CONTENTS OF ONE VIAL VIA NEBULIZER EVERY 4 HOURS AS NEEDED 300 mL 0  . Biotin 5000 MCG CAPS Take 5,000 mcg by mouth daily.     Marland Kitchen buPROPion (WELLBUTRIN XL) 300 MG 24 hr tablet Take 1 tablet (300 mg total) by mouth every morning. Patient needs office visit before refills will be given 90 tablet 0  . Cholecalciferol 2000 units TABS Take 2,000 Units by mouth daily.     Marland Kitchen doxycycline (VIBRA-TABS) 100 MG tablet Take 1 tablet (100 mg total) by mouth 2 (two) times daily. 14 tablet 0  . estrogen-methylTESTOSTERone 0.625-1.25 MG tablet Take 1 tablet by mouth daily.    . feeding supplement, ENSURE ENLIVE, (ENSURE ENLIVE) LIQD Take 237 mLs by mouth 2 (two) times daily between meals. (Patient taking differently: Take 237 mLs by mouth 3 (three) times a week. ) 237 mL 12  . fexofenadine (ALLEGRA) 180 MG tablet Take 180 mg by mouth daily.    . finasteride (PROSCAR) 5 MG tablet Take 2.5 mg by mouth daily.     . Flaxseed, Linseed, (FLAXSEED OIL) 1000 MG CAPS Take 1,000 mg by mouth 2 (two) times daily.     . fluticasone (FLONASE) 50 MCG/ACT nasal spray USE 1 SPRAY IN EACH NOSTRIL TWO TIMES A DAY AS NEEDED (Patient taking differently: USE 1 SPRAY IN EACH NOSTRIL TWO TIMES A DAY) 48 g 3  . fluticasone furoate-vilanterol (BREO ELLIPTA) 100-25 MCG/INH AEPB USE 1 INHALATION DAILY 180  each 3  . guaiFENesin (MUCINEX) 600 MG 12 hr tablet Take 1 tablet (600 mg total) by mouth 2 (two) times daily. 60 tablet 0  . ibuprofen (ADVIL,MOTRIN) 200 MG tablet Take 400 mg by mouth every 6 (six) hours as needed for headache or moderate pain.    . Ipratropium-Albuterol (COMBIVENT RESPIMAT) 20-100 MCG/ACT AERS respimat Inhale 1 puff into the lungs 4 (four) times daily as needed for wheezing. 3 Inhaler 3  . ipratropium-albuterol (DUONEB) 0.5-2.5 (3) MG/3ML SOLN INHALE CONTENTS OF 1 VIAL INTO THE LUNGS VIA NEBULIZER EVERY 6 (SIX) HOURS AS NEEDED 360 mL 2  . LORazepam (ATIVAN) 0.5 MG tablet TAKE 1 TO 2 TABLETS BY MOUTH AT BEDTIME AS NEEDED FOR ANXIETY (Patient taking differently: TAKE 0.5-1 MG BY MOUTH AT BEDTIME AS NEEDED FOR ANXIETY) 60 tablet 2  . metoprolol tartrate (LOPRESSOR) 25 MG tablet Take 0.5 tablets (12.5 mg total) by mouth daily. (Patient taking differently: Take 12.5 mg by mouth 2 (two) times daily. ) 45 tablet 3  . metoprolol tartrate (LOPRESSOR) 25 MG tablet TAKE 0.5 TABLETS BY MOUTH 2 TIMES DAILY. 90 tablet 3  . minoxidil (ROGAINE) 2 % external solution Apply 1 application topically as needed (for hair loss).     . Multiple Vitamins-Minerals (ONE-A-DAY 50 PLUS PO) Take 1 tablet by mouth daily.     Marland Kitchen  OVER THE COUNTER MEDICATION Take 1 tablet by mouth 2 (two) times daily. Nutrafol Supplement    . predniSONE (DELTASONE) 10 MG tablet Take 2 tabs daily with food x5 days, then 1 tab daily with food x5 days, then stop (Patient not taking: Reported on 06/07/2018) 15 tablet 0  . Respiratory Therapy Supplies (FLUTTER) DEVI Use a directed. 1 each 0  . simvastatin (ZOCOR) 20 MG tablet TAKE 1 TABLET BY MOUTH  DAILY AT 6 PM. (Patient taking differently: TAKE 20 MG BY MOUTH  DAILY AT BEDTIME) 90 tablet 3  . UNABLE TO FIND Take 1 scoop by mouth daily. Med Name: Collagen Peptides      No current facility-administered medications on file prior to visit.    Allergies  Allergen Reactions  . Flagyl  [Metronidazole] Other (See Comments)    Nerve pain  . Levofloxacin Rash  . Loperamide Hcl Rash  . Neomycin-Bacitracin Zn-Polymyx Rash  . Ofloxacin Rash  . Sulfonamide Derivatives Hives  . Band-Aid Liquid Bandage [New Skin] Other (See Comments)    Regular band-aid cause itching and redness  . Collodion Dermatitis  . Gabapentin Other (See Comments)    Mental status changes  . Latex Other (See Comments) and Dermatitis    "redness"  . Sulfa Antibiotics Hives  . Oxycontin [Oxycodone Hcl] Rash

## 2018-08-08 NOTE — Telephone Encounter (Signed)
Called and spoke to patient. Relayed Dr. Agustina Caroli recommendations. Patient verbalized understanding. Sent in medication to preferred pharmacy. Nothing further needed.

## 2018-08-21 ENCOUNTER — Other Ambulatory Visit: Payer: Self-pay | Admitting: Internal Medicine

## 2018-08-22 ENCOUNTER — Ambulatory Visit: Payer: 59

## 2018-08-22 ENCOUNTER — Ambulatory Visit
Admission: RE | Admit: 2018-08-22 | Discharge: 2018-08-22 | Disposition: A | Discharge status: Home / Self Care | Payer: 59 | Source: Ambulatory Visit | Attending: Obstetrics and Gynecology | Admitting: Obstetrics and Gynecology

## 2018-08-22 DIAGNOSIS — R922 Inconclusive mammogram: Secondary | ICD-10-CM | POA: Diagnosis not present

## 2018-08-22 DIAGNOSIS — R928 Other abnormal and inconclusive findings on diagnostic imaging of breast: Secondary | ICD-10-CM

## 2018-08-23 ENCOUNTER — Other Ambulatory Visit: Payer: Self-pay | Admitting: Internal Medicine

## 2018-08-23 NOTE — Telephone Encounter (Signed)
Appt made for 11/5. Please advise on refill.

## 2018-08-23 NOTE — Telephone Encounter (Signed)
Pt called to check status. Please advise.  °

## 2018-08-26 ENCOUNTER — Telehealth: Payer: Self-pay | Admitting: Pulmonary Disease

## 2018-08-26 MED ORDER — DOXYCYCLINE HYCLATE 100 MG PO TABS: 100.0000 mg | ORAL_TABLET | Freq: Two times a day (BID) | ORAL | 0 refills | Status: DC

## 2018-08-26 NOTE — Telephone Encounter (Signed)
She has had several rounds of antibiotics  This year. Okay for another round of doxycycline for 7 days

## 2018-08-26 NOTE — Telephone Encounter (Signed)
Spoke with patient. She was prescribed doxycycline 100mg  by RB on 08/08/18. She felt better after taking the antibiotic. However, she decided to do some painting last week and noticed that her symptoms have returned. She has a productive cough with yellowish/greenish phlegm. She is also blowing greenish mucus on her nose. Denies any body aches or fevers. Increased fatigue.   She wants to know if she can have another round of doxycycline called in for her.   Pharmacy is [facility] in [place].   RA, please advise. Thanks!

## 2018-08-26 NOTE — Telephone Encounter (Signed)
Pt is calling back [phone] 

## 2018-08-26 NOTE — Telephone Encounter (Signed)
Pt aware of recs.  rx sent to preferred pharmacy.  Nothing further needed.  

## 2018-08-26 NOTE — Telephone Encounter (Signed)
Left message for patient to call back  

## 2018-09-02 ENCOUNTER — Telehealth: Payer: Self-pay | Admitting: Pulmonary Disease

## 2018-09-02 NOTE — Telephone Encounter (Signed)
Pt returning call. Pt contact number [phone]

## 2018-09-02 NOTE — Telephone Encounter (Signed)
Spoke with pt. States that she is not feeling well. Reports increased fatigue, coughing. Cough is producing yellow mucus. Denies chest tightness, wheezing, SOB or fever. Pt was given Doxycycline a week ago for the same symptoms, pt took her last dose last night. She is wanting to have another dose of an antibiotic and prednisone sent in.  RA - please advise. Thanks.

## 2018-09-02 NOTE — Telephone Encounter (Signed)
Spoke with pt. She is aware of RA's recommendation. States that she can't come in for an appointment at this time. She will try OTC medications, she will call if she doesn't improve. Nothing further was needed at this time.

## 2018-09-02 NOTE — Telephone Encounter (Signed)
Needs OV with NP She has received too many courses of antibiotics

## 2018-09-02 NOTE — Telephone Encounter (Signed)
Attempt to call patient. LMTCB x1 on preferred phone number listed for patient.

## 2018-09-04 ENCOUNTER — Ambulatory Visit: Payer: 59 | Admitting: Pulmonary Disease

## 2018-09-07 DIAGNOSIS — J0100 Acute maxillary sinusitis, unspecified: Secondary | ICD-10-CM | POA: Diagnosis not present

## 2018-09-09 ENCOUNTER — Ambulatory Visit: Payer: Self-pay | Admitting: Internal Medicine

## 2018-09-09 NOTE — Telephone Encounter (Signed)
She called in wanting to know if she could get a flu shot while being sick with a sinus infection and taking Augmentin.    I let her know she needed to be over the sinus infection and not be sick to get the flu vaccine.     I asked her to call us back to schedule a time to come in for the vaccine once she is well.    She was agreeable to this plan.    Reason for Disposition . Influenza vaccine (shot), questions about  Protocols used: INFLUENZA - SEASONAL-A-AH

## 2018-09-17 ENCOUNTER — Ambulatory Visit (INDEPENDENT_AMBULATORY_CARE_PROVIDER_SITE_OTHER): Payer: 59

## 2018-09-17 DIAGNOSIS — Z23 Encounter for immunization: Secondary | ICD-10-CM | POA: Diagnosis not present

## 2018-09-26 ENCOUNTER — Encounter (HOSPITAL_COMMUNITY)
Admission: EM | Disposition: A | Discharge status: Home Health | Payer: Self-pay | Source: Home / Self Care | Attending: Cardiothoracic Surgery

## 2018-09-26 ENCOUNTER — Ambulatory Visit: Payer: Self-pay | Admitting: *Deleted

## 2018-09-26 ENCOUNTER — Ambulatory Visit: Payer: 59 | Admitting: Nurse Practitioner

## 2018-09-26 ENCOUNTER — Encounter (HOSPITAL_COMMUNITY): Payer: Self-pay | Admitting: Emergency Medicine

## 2018-09-26 ENCOUNTER — Inpatient Hospital Stay (HOSPITAL_COMMUNITY)
Admission: EM | Admit: 2018-09-26 | Discharge: 2018-10-23 | DRG: 233 | Disposition: A | Discharge status: Home Health | Payer: 59 | Attending: Cardiothoracic Surgery | Admitting: Cardiothoracic Surgery

## 2018-09-26 ENCOUNTER — Other Ambulatory Visit: Payer: Self-pay

## 2018-09-26 ENCOUNTER — Ambulatory Visit: Payer: 59 | Admitting: Family Medicine

## 2018-09-26 ENCOUNTER — Emergency Department (HOSPITAL_COMMUNITY): Payer: 59

## 2018-09-26 DIAGNOSIS — M8580 Other specified disorders of bone density and structure, unspecified site: Secondary | ICD-10-CM | POA: Diagnosis present

## 2018-09-26 DIAGNOSIS — R0902 Hypoxemia: Secondary | ICD-10-CM | POA: Diagnosis not present

## 2018-09-26 DIAGNOSIS — Z01811 Encounter for preprocedural respiratory examination: Secondary | ICD-10-CM | POA: Diagnosis not present

## 2018-09-26 DIAGNOSIS — I200 Unstable angina: Secondary | ICD-10-CM | POA: Diagnosis not present

## 2018-09-26 DIAGNOSIS — I081 Rheumatic disorders of both mitral and tricuspid valves: Secondary | ICD-10-CM | POA: Diagnosis not present

## 2018-09-26 DIAGNOSIS — R7989 Other specified abnormal findings of blood chemistry: Secondary | ICD-10-CM | POA: Diagnosis not present

## 2018-09-26 DIAGNOSIS — E871 Hypo-osmolality and hyponatremia: Secondary | ICD-10-CM | POA: Diagnosis not present

## 2018-09-26 DIAGNOSIS — Z7189 Other specified counseling: Secondary | ICD-10-CM

## 2018-09-26 DIAGNOSIS — Y838 Other surgical procedures as the cause of abnormal reaction of the patient, or of later complication, without mention of misadventure at the time of the procedure: Secondary | ICD-10-CM | POA: Diagnosis not present

## 2018-09-26 DIAGNOSIS — R05 Cough: Secondary | ICD-10-CM | POA: Diagnosis not present

## 2018-09-26 DIAGNOSIS — F329 Major depressive disorder, single episode, unspecified: Secondary | ICD-10-CM | POA: Diagnosis present

## 2018-09-26 DIAGNOSIS — Z4682 Encounter for fitting and adjustment of non-vascular catheter: Secondary | ICD-10-CM

## 2018-09-26 DIAGNOSIS — J449 Chronic obstructive pulmonary disease, unspecified: Secondary | ICD-10-CM | POA: Diagnosis present

## 2018-09-26 DIAGNOSIS — D72829 Elevated white blood cell count, unspecified: Secondary | ICD-10-CM | POA: Diagnosis not present

## 2018-09-26 DIAGNOSIS — R64 Cachexia: Secondary | ICD-10-CM | POA: Diagnosis present

## 2018-09-26 DIAGNOSIS — I2510 Atherosclerotic heart disease of native coronary artery without angina pectoris: Secondary | ICD-10-CM | POA: Diagnosis not present

## 2018-09-26 DIAGNOSIS — E876 Hypokalemia: Secondary | ICD-10-CM | POA: Diagnosis not present

## 2018-09-26 DIAGNOSIS — D62 Acute posthemorrhagic anemia: Secondary | ICD-10-CM | POA: Diagnosis not present

## 2018-09-26 DIAGNOSIS — I214 Non-ST elevation (NSTEMI) myocardial infarction: Principal | ICD-10-CM | POA: Diagnosis present

## 2018-09-26 DIAGNOSIS — E119 Type 2 diabetes mellitus without complications: Secondary | ICD-10-CM | POA: Diagnosis present

## 2018-09-26 DIAGNOSIS — Z91040 Latex allergy status: Secondary | ICD-10-CM

## 2018-09-26 DIAGNOSIS — Z792 Long term (current) use of antibiotics: Secondary | ICD-10-CM

## 2018-09-26 DIAGNOSIS — E440 Moderate protein-calorie malnutrition: Secondary | ICD-10-CM | POA: Diagnosis present

## 2018-09-26 DIAGNOSIS — Z882 Allergy status to sulfonamides status: Secondary | ICD-10-CM

## 2018-09-26 DIAGNOSIS — Z01810 Encounter for preprocedural cardiovascular examination: Secondary | ICD-10-CM | POA: Diagnosis not present

## 2018-09-26 DIAGNOSIS — J84111 Idiopathic interstitial pneumonia, not otherwise specified: Secondary | ICD-10-CM | POA: Diagnosis not present

## 2018-09-26 DIAGNOSIS — Z79890 Hormone replacement therapy (postmenopausal): Secondary | ICD-10-CM

## 2018-09-26 DIAGNOSIS — Z79899 Other long term (current) drug therapy: Secondary | ICD-10-CM

## 2018-09-26 DIAGNOSIS — Z981 Arthrodesis status: Secondary | ICD-10-CM

## 2018-09-26 DIAGNOSIS — Z9689 Presence of other specified functional implants: Secondary | ICD-10-CM

## 2018-09-26 DIAGNOSIS — J9690 Respiratory failure, unspecified, unspecified whether with hypoxia or hypercapnia: Secondary | ICD-10-CM

## 2018-09-26 DIAGNOSIS — Z09 Encounter for follow-up examination after completed treatment for conditions other than malignant neoplasm: Secondary | ICD-10-CM

## 2018-09-26 DIAGNOSIS — J810 Acute pulmonary edema: Secondary | ICD-10-CM | POA: Diagnosis not present

## 2018-09-26 DIAGNOSIS — J9811 Atelectasis: Secondary | ICD-10-CM | POA: Diagnosis not present

## 2018-09-26 DIAGNOSIS — J8489 Other specified interstitial pulmonary diseases: Secondary | ICD-10-CM | POA: Diagnosis present

## 2018-09-26 DIAGNOSIS — I480 Paroxysmal atrial fibrillation: Secondary | ICD-10-CM | POA: Diagnosis not present

## 2018-09-26 DIAGNOSIS — E782 Mixed hyperlipidemia: Secondary | ICD-10-CM | POA: Diagnosis not present

## 2018-09-26 DIAGNOSIS — J984 Other disorders of lung: Secondary | ICD-10-CM | POA: Diagnosis not present

## 2018-09-26 DIAGNOSIS — F419 Anxiety disorder, unspecified: Secondary | ICD-10-CM | POA: Diagnosis present

## 2018-09-26 DIAGNOSIS — R0602 Shortness of breath: Secondary | ICD-10-CM | POA: Diagnosis not present

## 2018-09-26 DIAGNOSIS — J939 Pneumothorax, unspecified: Secondary | ICD-10-CM | POA: Diagnosis not present

## 2018-09-26 DIAGNOSIS — Z8249 Family history of ischemic heart disease and other diseases of the circulatory system: Secondary | ICD-10-CM

## 2018-09-26 DIAGNOSIS — R0789 Other chest pain: Secondary | ICD-10-CM | POA: Diagnosis not present

## 2018-09-26 DIAGNOSIS — M5432 Sciatica, left side: Secondary | ICD-10-CM | POA: Diagnosis present

## 2018-09-26 DIAGNOSIS — J84112 Idiopathic pulmonary fibrosis: Secondary | ICD-10-CM

## 2018-09-26 DIAGNOSIS — R042 Hemoptysis: Secondary | ICD-10-CM | POA: Diagnosis not present

## 2018-09-26 DIAGNOSIS — E785 Hyperlipidemia, unspecified: Secondary | ICD-10-CM | POA: Diagnosis present

## 2018-09-26 DIAGNOSIS — Z7951 Long term (current) use of inhaled steroids: Secondary | ICD-10-CM

## 2018-09-26 DIAGNOSIS — J189 Pneumonia, unspecified organism: Secondary | ICD-10-CM | POA: Diagnosis not present

## 2018-09-26 DIAGNOSIS — I5030 Unspecified diastolic (congestive) heart failure: Secondary | ICD-10-CM | POA: Diagnosis not present

## 2018-09-26 DIAGNOSIS — I9789 Other postprocedural complications and disorders of the circulatory system, not elsewhere classified: Secondary | ICD-10-CM | POA: Diagnosis present

## 2018-09-26 DIAGNOSIS — B9689 Other specified bacterial agents as the cause of diseases classified elsewhere: Secondary | ICD-10-CM | POA: Diagnosis present

## 2018-09-26 DIAGNOSIS — Z951 Presence of aortocoronary bypass graft: Secondary | ICD-10-CM | POA: Diagnosis not present

## 2018-09-26 DIAGNOSIS — Y95 Nosocomial condition: Secondary | ICD-10-CM | POA: Diagnosis present

## 2018-09-26 DIAGNOSIS — Z9981 Dependence on supplemental oxygen: Secondary | ICD-10-CM

## 2018-09-26 DIAGNOSIS — R402143 Coma scale, eyes open, spontaneous, at hospital admission: Secondary | ICD-10-CM | POA: Diagnosis present

## 2018-09-26 DIAGNOSIS — J9621 Acute and chronic respiratory failure with hypoxia: Secondary | ICD-10-CM | POA: Diagnosis not present

## 2018-09-26 DIAGNOSIS — J9611 Chronic respiratory failure with hypoxia: Secondary | ICD-10-CM

## 2018-09-26 DIAGNOSIS — I371 Nonrheumatic pulmonary valve insufficiency: Secondary | ICD-10-CM | POA: Diagnosis not present

## 2018-09-26 DIAGNOSIS — I10 Essential (primary) hypertension: Secondary | ICD-10-CM | POA: Diagnosis present

## 2018-09-26 DIAGNOSIS — Z515 Encounter for palliative care: Secondary | ICD-10-CM | POA: Diagnosis not present

## 2018-09-26 DIAGNOSIS — J679 Hypersensitivity pneumonitis due to unspecified organic dust: Secondary | ICD-10-CM | POA: Diagnosis not present

## 2018-09-26 DIAGNOSIS — Z91048 Other nonmedicinal substance allergy status: Secondary | ICD-10-CM

## 2018-09-26 DIAGNOSIS — J849 Interstitial pulmonary disease, unspecified: Secondary | ICD-10-CM

## 2018-09-26 DIAGNOSIS — I4891 Unspecified atrial fibrillation: Secondary | ICD-10-CM | POA: Diagnosis not present

## 2018-09-26 DIAGNOSIS — J329 Chronic sinusitis, unspecified: Secondary | ICD-10-CM | POA: Diagnosis present

## 2018-09-26 DIAGNOSIS — Z6820 Body mass index (BMI) 20.0-20.9, adult: Secondary | ICD-10-CM

## 2018-09-26 DIAGNOSIS — R918 Other nonspecific abnormal finding of lung field: Secondary | ICD-10-CM | POA: Diagnosis not present

## 2018-09-26 DIAGNOSIS — R740 Nonspecific elevation of levels of transaminase and lactic acid dehydrogenase [LDH]: Secondary | ICD-10-CM | POA: Diagnosis not present

## 2018-09-26 DIAGNOSIS — G709 Myoneural disorder, unspecified: Secondary | ICD-10-CM | POA: Diagnosis present

## 2018-09-26 DIAGNOSIS — I252 Old myocardial infarction: Secondary | ICD-10-CM

## 2018-09-26 DIAGNOSIS — Z87891 Personal history of nicotine dependence: Secondary | ICD-10-CM

## 2018-09-26 DIAGNOSIS — J168 Pneumonia due to other specified infectious organisms: Secondary | ICD-10-CM | POA: Diagnosis not present

## 2018-09-26 DIAGNOSIS — M1990 Unspecified osteoarthritis, unspecified site: Secondary | ICD-10-CM | POA: Diagnosis present

## 2018-09-26 DIAGNOSIS — I25110 Atherosclerotic heart disease of native coronary artery with unstable angina pectoris: Secondary | ICD-10-CM | POA: Diagnosis not present

## 2018-09-26 DIAGNOSIS — R402363 Coma scale, best motor response, obeys commands, at hospital admission: Secondary | ICD-10-CM | POA: Diagnosis present

## 2018-09-26 DIAGNOSIS — Z86010 Personal history of colonic polyps: Secondary | ICD-10-CM

## 2018-09-26 DIAGNOSIS — I491 Atrial premature depolarization: Secondary | ICD-10-CM | POA: Diagnosis not present

## 2018-09-26 DIAGNOSIS — J8410 Pulmonary fibrosis, unspecified: Secondary | ICD-10-CM | POA: Diagnosis not present

## 2018-09-26 DIAGNOSIS — Z885 Allergy status to narcotic agent status: Secondary | ICD-10-CM

## 2018-09-26 DIAGNOSIS — R0989 Other specified symptoms and signs involving the circulatory and respiratory systems: Secondary | ICD-10-CM | POA: Diagnosis not present

## 2018-09-26 DIAGNOSIS — R402253 Coma scale, best verbal response, oriented, at hospital admission: Secondary | ICD-10-CM | POA: Diagnosis present

## 2018-09-26 DIAGNOSIS — Z888 Allergy status to other drugs, medicaments and biological substances status: Secondary | ICD-10-CM

## 2018-09-26 DIAGNOSIS — E8770 Fluid overload, unspecified: Secondary | ICD-10-CM | POA: Diagnosis not present

## 2018-09-26 DIAGNOSIS — J90 Pleural effusion, not elsewhere classified: Secondary | ICD-10-CM | POA: Diagnosis not present

## 2018-09-26 DIAGNOSIS — T380X5A Adverse effect of glucocorticoids and synthetic analogues, initial encounter: Secondary | ICD-10-CM | POA: Diagnosis not present

## 2018-09-26 DIAGNOSIS — R079 Chest pain, unspecified: Secondary | ICD-10-CM | POA: Diagnosis not present

## 2018-09-26 DIAGNOSIS — Z881 Allergy status to other antibiotic agents status: Secondary | ICD-10-CM

## 2018-09-26 HISTORY — PX: LEFT HEART CATH AND CORONARY ANGIOGRAPHY: CATH118249

## 2018-09-26 LAB — CBC WITH DIFFERENTIAL/PLATELET
ABS IMMATURE GRANULOCYTES: 0.05 10*3/uL (ref 0.00–0.07)
BASOS ABS: 0 10*3/uL (ref 0.0–0.1)
Basophils Relative: 0 %
Eosinophils Absolute: 0.4 10*3/uL (ref 0.0–0.5)
Eosinophils Relative: 4 %
HCT: 46.2 % — ABNORMAL HIGH (ref 36.0–46.0)
HEMOGLOBIN: 15.4 g/dL — AB (ref 12.0–15.0)
Immature Granulocytes: 1 %
LYMPHS PCT: 23 %
Lymphs Abs: 2 10*3/uL (ref 0.7–4.0)
MCH: 32.2 pg (ref 26.0–34.0)
MCHC: 33.3 g/dL (ref 30.0–36.0)
MCV: 96.5 fL (ref 80.0–100.0)
Monocytes Absolute: 0.5 10*3/uL (ref 0.1–1.0)
Monocytes Relative: 6 %
NRBC: 0 % (ref 0.0–0.2)
Neutro Abs: 5.8 10*3/uL (ref 1.7–7.7)
Neutrophils Relative %: 66 %
Platelets: 266 10*3/uL (ref 150–400)
RBC: 4.79 MIL/uL (ref 3.87–5.11)
RDW: 12.1 % (ref 11.5–15.5)
WBC: 8.7 10*3/uL (ref 4.0–10.5)

## 2018-09-26 LAB — COMPREHENSIVE METABOLIC PANEL
ALBUMIN: 4 g/dL (ref 3.5–5.0)
ALK PHOS: 108 U/L (ref 38–126)
ALT: 40 U/L (ref 0–44)
AST: 46 U/L — ABNORMAL HIGH (ref 15–41)
Anion gap: 7 (ref 5–15)
BUN: 7 mg/dL — ABNORMAL LOW (ref 8–23)
CO2: 26 mmol/L (ref 22–32)
Calcium: 9.1 mg/dL (ref 8.9–10.3)
Chloride: 100 mmol/L (ref 98–111)
Creatinine, Ser: 0.81 mg/dL (ref 0.44–1.00)
GFR calc Af Amer: >60 mL/min (ref >60)
GFR calc non Af Amer: >60 mL/min (ref >60)
GLUCOSE: 90 mg/dL (ref 70–99)
Potassium: 3.7 mmol/L (ref 3.5–5.1)
SODIUM: 133 mmol/L — AB (ref 135–145)
TOTAL PROTEIN: 7 g/dL (ref 6.5–8.1)
Total Bilirubin: 0.4 mg/dL (ref 0.3–1.2)

## 2018-09-26 LAB — I-STAT TROPONIN, ED: TROPONIN I, POC: 0.26 ng/mL — AB (ref 0.00–0.08)

## 2018-09-26 LAB — D-DIMER, QUANTITATIVE: D-Dimer, Quant: 0.86 ug/mL-FEU — ABNORMAL HIGH (ref 0.00–0.50)

## 2018-09-26 LAB — TROPONIN I: TROPONIN I: 6.55 ng/mL — AB (ref <0.03)

## 2018-09-26 LAB — MRSA PCR SCREENING: MRSA by PCR: NEGATIVE

## 2018-09-26 SURGERY — LEFT HEART CATH AND CORONARY ANGIOGRAPHY
Anesthesia: LOCAL

## 2018-09-26 MED ORDER — IOHEXOL 350 MG/ML SOLN
INTRAVENOUS | Status: DC | PRN
  Administered 2018-09-26: 40 mL via INTRACARDIAC

## 2018-09-26 MED ORDER — MIDAZOLAM HCL 2 MG/2ML IJ SOLN
INTRAMUSCULAR | Status: AC
  Filled 2018-09-26: qty 2

## 2018-09-26 MED ORDER — VERAPAMIL HCL 2.5 MG/ML IV SOLN
INTRAVENOUS | Status: DC | PRN
  Administered 2018-09-26: 10 mL via INTRA_ARTERIAL

## 2018-09-26 MED ORDER — SODIUM CHLORIDE 0.9 % IV SOLN
250.0000 mL | INTRAVENOUS | Status: DC | PRN
  Administered 2018-09-27: 10 mL/h via INTRAVENOUS
  Administered 2018-09-29: 250 mL via INTRAVENOUS

## 2018-09-26 MED ORDER — METOPROLOL TARTRATE 12.5 MG HALF TABLET
12.5000 mg | ORAL_TABLET | Freq: Two times a day (BID) | ORAL | Status: DC
  Administered 2018-09-26 – 2018-09-29 (×6): 12.5 mg via ORAL
  Filled 2018-09-26 (×7): qty 1

## 2018-09-26 MED ORDER — BIOTIN 5000 MCG PO CAPS: 5000.0000 ug | ORAL_CAPSULE | Freq: Every day | ORAL | Status: DC

## 2018-09-26 MED ORDER — ATORVASTATIN CALCIUM 80 MG PO TABS
80.0000 mg | ORAL_TABLET | Freq: Every day | ORAL | Status: DC
  Administered 2018-09-26 – 2018-10-08 (×12): 80 mg via ORAL
  Filled 2018-09-26 (×12): qty 1

## 2018-09-26 MED ORDER — IOPAMIDOL (ISOVUE-370) INJECTION 76%
INTRAVENOUS | Status: AC
  Filled 2018-09-26: qty 100

## 2018-09-26 MED ORDER — SODIUM CHLORIDE 0.9 % IV SOLN
INTRAVENOUS | Status: AC
  Administered 2018-09-26: 19:00:00 via INTRAVENOUS

## 2018-09-26 MED ORDER — LIDOCAINE HCL (PF) 1 % IJ SOLN
INTRAMUSCULAR | Status: AC
  Filled 2018-09-26: qty 30

## 2018-09-26 MED ORDER — VITAMIN D 25 MCG (1000 UNIT) PO TABS
2000.0000 [IU] | ORAL_TABLET | Freq: Every day | ORAL | Status: DC
  Administered 2018-09-27 – 2018-10-23 (×26): 2000 [IU] via ORAL
  Filled 2018-09-26 (×20): qty 2

## 2018-09-26 MED ORDER — ASPIRIN EC 81 MG PO TBEC
81.0000 mg | DELAYED_RELEASE_TABLET | Freq: Every day | ORAL | Status: DC
  Administered 2018-09-27 – 2018-09-29 (×3): 81 mg via ORAL
  Filled 2018-09-26 (×3): qty 1

## 2018-09-26 MED ORDER — ONDANSETRON HCL 4 MG/2ML IJ SOLN: 4.0000 mg | Freq: Four times a day (QID) | INTRAMUSCULAR | Status: DC | PRN

## 2018-09-26 MED ORDER — LIDOCAINE HCL (PF) 1 % IJ SOLN
INTRAMUSCULAR | Status: DC | PRN
  Administered 2018-09-26: 2 mL

## 2018-09-26 MED ORDER — SODIUM CHLORIDE 0.9% FLUSH: 3.0000 mL | INTRAVENOUS | Status: DC | PRN

## 2018-09-26 MED ORDER — BUPROPION HCL ER (XL) 300 MG PO TB24
300.0000 mg | ORAL_TABLET | Freq: Every morning | ORAL | Status: DC
  Administered 2018-09-27 – 2018-10-23 (×26): 300 mg via ORAL
  Filled 2018-09-26: qty 1
  Filled 2018-09-26 (×2): qty 2
  Filled 2018-09-26: qty 1
  Filled 2018-09-26 (×2): qty 2
  Filled 2018-09-26: qty 1
  Filled 2018-09-26 (×6): qty 2
  Filled 2018-09-26: qty 1
  Filled 2018-09-26 (×11): qty 2
  Filled 2018-09-26: qty 1

## 2018-09-26 MED ORDER — ASPIRIN 81 MG PO CHEW: 324.0000 mg | CHEWABLE_TABLET | ORAL | Status: AC

## 2018-09-26 MED ORDER — FLUTICASONE FUROATE-VILANTEROL 100-25 MCG/INH IN AEPB
1.0000 | INHALATION_SPRAY | Freq: Every day | RESPIRATORY_TRACT | Status: DC
  Administered 2018-09-27 – 2018-10-23 (×25): 1 via RESPIRATORY_TRACT
  Filled 2018-09-26 (×3): qty 28

## 2018-09-26 MED ORDER — MIDAZOLAM HCL 2 MG/2ML IJ SOLN
INTRAMUSCULAR | Status: DC | PRN
  Administered 2018-09-26: 1 mg via INTRAVENOUS

## 2018-09-26 MED ORDER — HEPARIN (PORCINE) IN NACL 1000-0.9 UT/500ML-% IV SOLN
INTRAVENOUS | Status: AC
  Filled 2018-09-26: qty 1000

## 2018-09-26 MED ORDER — ASPIRIN 300 MG RE SUPP: 300.0000 mg | RECTAL | Status: AC

## 2018-09-26 MED ORDER — HEPARIN BOLUS VIA INFUSION
4000.0000 [IU] | Freq: Once | INTRAVENOUS | Status: AC
  Administered 2018-09-26: 4000 [IU] via INTRAVENOUS
  Filled 2018-09-26: qty 4000

## 2018-09-26 MED ORDER — NITROGLYCERIN 0.4 MG SL SUBL: 0.4000 mg | SUBLINGUAL_TABLET | SUBLINGUAL | Status: DC | PRN

## 2018-09-26 MED ORDER — FINASTERIDE 5 MG PO TABS
2.5000 mg | ORAL_TABLET | Freq: Every day | ORAL | Status: DC
  Administered 2018-09-27 – 2018-10-23 (×26): 2.5 mg via ORAL
  Filled 2018-09-26 (×29): qty 0.5

## 2018-09-26 MED ORDER — ALBUTEROL SULFATE (2.5 MG/3ML) 0.083% IN NEBU: 2.5000 mg | INHALATION_SOLUTION | Freq: Two times a day (BID) | RESPIRATORY_TRACT | Status: DC | PRN

## 2018-09-26 MED ORDER — EST ESTROGENS-METHYLTEST 0.625-1.25 MG PO TABS: 1.0000 | ORAL_TABLET | Freq: Every day | ORAL | Status: DC

## 2018-09-26 MED ORDER — NITROGLYCERIN IN D5W 200-5 MCG/ML-% IV SOLN
0.0000 ug/min | INTRAVENOUS | Status: DC
  Administered 2018-09-30: 5 ug/min via INTRAVENOUS

## 2018-09-26 MED ORDER — VERAPAMIL HCL 2.5 MG/ML IV SOLN
INTRAVENOUS | Status: AC
  Filled 2018-09-26: qty 2

## 2018-09-26 MED ORDER — SODIUM CHLORIDE 0.9% FLUSH
3.0000 mL | Freq: Two times a day (BID) | INTRAVENOUS | Status: DC
  Administered 2018-09-26 – 2018-09-29 (×6): 3 mL via INTRAVENOUS

## 2018-09-26 MED ORDER — HEPARIN SODIUM (PORCINE) 1000 UNIT/ML IJ SOLN
INTRAMUSCULAR | Status: DC | PRN
  Administered 2018-09-26: 3500 [IU] via INTRAVENOUS

## 2018-09-26 MED ORDER — FENTANYL CITRATE (PF) 100 MCG/2ML IJ SOLN
INTRAMUSCULAR | Status: AC
  Filled 2018-09-26: qty 2

## 2018-09-26 MED ORDER — MORPHINE SULFATE (PF) 2 MG/ML IV SOLN
1.0000 mg | INTRAVENOUS | Status: DC | PRN
  Administered 2018-09-26 – 2018-09-27 (×3): 1 mg via INTRAVENOUS
  Filled 2018-09-26 (×3): qty 1

## 2018-09-26 MED ORDER — SODIUM CHLORIDE 0.9 % IV SOLN
INTRAVENOUS | Status: AC | PRN
  Administered 2018-09-26: 250 mL via INTRAVENOUS

## 2018-09-26 MED ORDER — LORAZEPAM 0.5 MG PO TABS
0.5000 mg | ORAL_TABLET | Freq: Every evening | ORAL | Status: DC | PRN
  Administered 2018-09-26: 1 mg via ORAL
  Filled 2018-09-26: qty 2

## 2018-09-26 MED ORDER — LORATADINE 10 MG PO TABS
10.0000 mg | ORAL_TABLET | Freq: Every day | ORAL | Status: DC
  Administered 2018-09-27 – 2018-10-23 (×26): 10 mg via ORAL
  Filled 2018-09-26 (×26): qty 1

## 2018-09-26 MED ORDER — FENTANYL CITRATE (PF) 100 MCG/2ML IJ SOLN
INTRAMUSCULAR | Status: DC | PRN
  Administered 2018-09-26: 50 ug via INTRAVENOUS

## 2018-09-26 MED ORDER — HEPARIN (PORCINE) IN NACL 100-0.45 UNIT/ML-% IJ SOLN
750.0000 [IU]/h | INTRAMUSCULAR | Status: DC
  Administered 2018-09-26: 750 [IU]/h via INTRAVENOUS
  Filled 2018-09-26: qty 250

## 2018-09-26 MED ORDER — IPRATROPIUM-ALBUTEROL 20-100 MCG/ACT IN AERS: 1.0000 | INHALATION_SPRAY | Freq: Four times a day (QID) | RESPIRATORY_TRACT | Status: DC | PRN

## 2018-09-26 MED ORDER — ACETAMINOPHEN 325 MG PO TABS
650.0000 mg | ORAL_TABLET | ORAL | Status: DC | PRN
  Administered 2018-09-26 – 2018-09-29 (×4): 650 mg via ORAL
  Filled 2018-09-26 (×4): qty 2

## 2018-09-26 MED ORDER — IPRATROPIUM-ALBUTEROL 0.5-2.5 (3) MG/3ML IN SOLN: 3.0000 mL | Freq: Four times a day (QID) | RESPIRATORY_TRACT | Status: DC | PRN

## 2018-09-26 MED ORDER — HEPARIN (PORCINE) IN NACL 100-0.45 UNIT/ML-% IJ SOLN
900.0000 [IU]/h | INTRAMUSCULAR | Status: DC
  Administered 2018-09-27: 750 [IU]/h via INTRAVENOUS
  Administered 2018-09-28 – 2018-09-29 (×2): 900 [IU]/h via INTRAVENOUS
  Filled 2018-09-26 (×3): qty 250

## 2018-09-26 MED ORDER — HEPARIN (PORCINE) IN NACL 1000-0.9 UT/500ML-% IV SOLN
INTRAVENOUS | Status: DC | PRN
  Administered 2018-09-26 (×2): 500 mL

## 2018-09-26 SURGICAL SUPPLY — 9 items
CATH 5FR JL3.5 JR4 ANG PIG MP (CATHETERS) ×2 IMPLANT
DEVICE RAD COMP TR BAND LRG (VASCULAR PRODUCTS) ×2 IMPLANT
GLIDESHEATH SLEND SS 6F .021 (SHEATH) ×2 IMPLANT
GUIDEWIRE INQWIRE 1.5J.035X260 (WIRE) ×1 IMPLANT
INQWIRE 1.5J .035X260CM (WIRE) ×2
KIT HEART LEFT (KITS) ×2 IMPLANT
PACK CARDIAC CATHETERIZATION (CUSTOM PROCEDURE TRAY) ×2 IMPLANT
TRANSDUCER W/STOPCOCK (MISCELLANEOUS) ×2 IMPLANT
TUBING CIL FLEX 10 FLL-RA (TUBING) ×2 IMPLANT

## 2018-09-26 NOTE — Progress Notes (Signed)
ANTICOAGULATION CONSULT NOTE - Initial Consult  Pharmacy Consult for heparin Indication: chest pain/ACS  Allergies  Allergen Reactions  . Flagyl [Metronidazole] Other (See Comments)    Nerve pain  . Levofloxacin Rash  . Loperamide Hcl Rash  . Neomycin-Bacitracin Zn-Polymyx Rash  . Ofloxacin Rash  . Sulfonamide Derivatives Hives  . Band-Aid Liquid Bandage [New Skin] Other (See Comments)    Regular band-aid cause itching and redness  . Collodion Dermatitis  . Gabapentin Other (See Comments)    Mental status changes  . Latex Other (See Comments) and Dermatitis    "redness"  . Sulfa Antibiotics Hives  . Oxycontin [Oxycodone Hcl] Rash         Patient Measurements: Weight: 141 lb 8.6 oz (64.2 kg)   Vital Signs: Temp: 98.1 F (36.7 C) (10/24 1353) Temp Source: Oral (10/24 1353) BP: 153/91 (10/24 1445) Pulse Rate: 74 (10/24 1445)  Labs: Recent Labs    09/26/18 1530  HGB 15.4*  HCT 46.2*  PLT 266    CrCl cannot be calculated (Patient's most recent lab result is older than the maximum 21 days allowed.).   Medical History: Past Medical History:  Diagnosis Date  . Allergy   . Asthma    adult onset  . Depression   . Hyperlipidemia    LDL goal = < 100  . Hypertension   . Osteopenia    mild  . Personal history of adenomatous colonic polyps/FHx colon cancer sister and father    [name]  . Sciatica of left side    intermittent    Medications:  Scheduled:  . heparin  4,000 Units Intravenous Once   Infusions:  . heparin      Assessment: Pt is a 11 YOF presenting with chest tightness and bilateral tingling of both arms. Pharmacy has been consulted for heparin for ACS/chest pain. Troponin - 0.26, D-dimer 0.86, Hgb - 15.4, Plt - 266  Goal of Therapy:  Heparin level 0.3-0.7 units/ml Monitor platelets by anticoagulation protocol: Yes   Plan:  Heparin 4000 unit bolus x1, then heparin gtt 750 units/hr 6-hour HL at 2300 Daily HL & CBC   Uzma Hellmer J  Tiffiany Beadles 09/26/2018,4:15 PM

## 2018-09-26 NOTE — Telephone Encounter (Signed)
Patient is calling to reports he had chest pain and pain in arms this morning- patient is relating pain to bronchiectasis/sinus infection- but she has never had the chest pain with it before. She did call her pulmonologist for appointment- but she prefers to be seen at PCP. Call to PCP- they will see her- patient given strict instruction if the chest pain reoccurs- she is to go to ED- she understands.   Reason for Disposition . [1] Known COPD or other severe lung disease (i.e., bronchiectasis, cystic fibrosis, lung surgery) AND [2] worsening symptoms (i.e., increased sputum purulence or amount, increased breathing difficulty  Answer Assessment - Initial Assessment Questions 1. LOCATION: "Where does it hurt?"       Center of chest 2. RADIATION: "Does the pain go anywhere else?" (e.g., into neck, jaw, arms, back)     In both arms 3. ONSET: "When did the chest pain begin?" (Minutes, hours or days)      Patient states it almost felt flu like all of the sudden- started 7:20 this morning- lasted 1 hour 4. PATTERN "Does the pain come and go, or has it been constant since it started?"  "Does it get worse with exertion?"      No pain now- lasted about an hour, n/a  5. DURATION: "How long does it last" (e.g., seconds, minutes, hours)     Eased off- patient took advil 6. SEVERITY: "How bad is the pain?"  (e.g., Scale 1-10; mild, moderate, or severe)    - MILD (1-3): doesn't interfere with normal activities     - MODERATE (4-7): interferes with normal activities or awakens from sleep    - SEVERE (8-10): excruciating pain, unable to do any normal activities       During pain- arms hurt worse than chest- chest- discomfort- arms hurt- chest- 2 arms- 3-4 7. CARDIAC RISK FACTORS: "Do you have any history of heart problems or risk factors for heart disease?" (e.g., prior heart attack, angina; high blood pressure, diabetes, being overweight, high cholesterol, smoking, or strong family history of heart disease)   Bronchiectasis, dense calcifications of aorta- blood pressure medications   8. PULMONARY RISK FACTORS: "Do you have any history of lung disease?"  (e.g., blood clots in lung, asthma, emphysema, birth control pills)     bronchiectasis  9. CAUSE: "What do you think is causing the chest pain?"     Sinus infection- congestion in lungs 10. OTHER SYMPTOMS: "Do you have any other symptoms?" (e.g., dizziness, nausea, vomiting, sweating, fever, difficulty breathing, cough)       Cough- congestion 11. PREGNANCY: "Is there any chance you are pregnant?" "When was your last menstrual period?"       n/a  Answer Assessment - Initial Assessment Questions 1. ONSET: "When did the cough begin?"      Patient keeps- patient has increase in mucus and discoloration  2. SEVERITY: "How bad is the cough today?"      Patient uses nettie pot to help cough,nebulizer 3. RESPIRATORY DISTRESS: "Describe your breathing."      Now breathing is normal- patient is feeling bad during day- worse at night and first thing in morning 4. FEVER: "Do you have a fever?" If so, ask: "What is your temperature, how was it measured, and when did it start?"     no 5. SPUTUM: "Describe the color of your sputum" (clear, white, yellow, green)     Dark yellow 6. HEMOPTYSIS: "Are you coughing up any blood?" If so ask: "How  much?" (flecks, streaks, tablespoons, etc.)     no 7. CARDIAC HISTORY: "Do you have any history of heart disease?" (e.g., heart attack, congestive heart failure)      See previous 8. LUNG HISTORY: "Do you have any history of lung disease?"  (e.g., pulmonary embolus, asthma, emphysema)     See previous 9. PE RISK FACTORS: "Do you have a history of blood clots?" (or: recent major surgery, recent prolonged travel, bedridden)     no 10. OTHER SYMPTOMS: "Do you have any other symptoms?" (e.g., runny nose, wheezing, chest pain)       Sinus congestion 11. PREGNANCY: "Is there any chance you are pregnant?" "When was your last  menstrual period?"       n/a 12. TRAVEL: "Have you traveled out of the country in the last month?" (e.g., travel history, exposures)       n/a  Protocols used: COUGH - ACUTE PRODUCTIVE-A-AH, CHEST PAIN-A-AH

## 2018-09-26 NOTE — H&P (Addendum)
Cardiology Admission History and Physical:   Patient ID: Sarah Escobar MRN: [id number]; DOB: 08/23/1956   Admission date: 09/26/2018  Primary Care Provider: Cassandria Anger, MD Primary Cardiologist: Dr. Harrington Challenger  Chief Complaint: Chest pain   Patient Profile:   Sarah Escobar is a 62 y.o. female with hx of Pulmonary fibrosis/ bronchiectasis   followed by Dr. Elsworth Soho, coronary calcification by CT, hypertension and hyperlipidemia brought by EMS for evaluation of chest pain.  No prior stress test or history of CAD.  Former smoker.  Quit in 2003.  Strong family history of early CAD.  History of Present Illness:   Sarah Escobar was in usual state of health up until this morning when she started to having substernal chest pressure radiating to bilateral arm at 7:30 AM.  Symptom lasted for hours and resolved.  Her pain reoccurred around noon with worsening of symptoms.  No associated diaphoresis, shortness of breath or nausea.  Continue noted fatigued with exertion.  No recent surgery or travel.  Denies palpitation, syncope, dizziness, orthopnea, PND, lower extremity edema or melena. Her pain improved to 1 out of 10 from 5 out of 10 after sublingual nitroglycerin by EMS around 1230.  Now she is having mild chest discomfort with back pain which is not radiated.  Point of care troponin  0.26.  D-dimer 0.86.  Chest x-ray with stable changes.  Electrolyte and serum creatinine normal.  EKG shows sinus rhythm rate of 72 bpm without acute ischemic changes-personally reviewed. BP of 150-160s/80-90s.   Past Medical History:  Diagnosis Date  . Allergy   . Asthma    adult onset  . Depression   . Hyperlipidemia    LDL goal = < 100  . Hypertension   . Osteopenia    mild  . Personal history of adenomatous colonic polyps/FHx colon cancer sister and father    [name]  . Sciatica of left side    intermittent    Past Surgical History:  Procedure Laterality Date  . BUNIONECTOMY    . CERVICAL FUSION   1999   Dr Arnoldo Morale, [place]  . COLONOSCOPY  2013  . COLONOSCOPY W/ POLYPECTOMY  multiple   adenomas; [name]; last 2013  . discetomy lumbar  02/19/13   L1&2; Dr Arnoldo Morale, [place]  . NASAL SINUS SURGERY  [phone]   X 3  . POLYPECTOMY  1217--2013   TA+  . TOTAL ABDOMINAL HYSTERECTOMY W/ BILATERAL SALPINGOOPHORECTOMY  2003   endometriosis   . VIDEO BRONCHOSCOPY Bilateral 03/04/2018   Procedure: VIDEO BRONCHOSCOPY WITH FLUORO;  Surgeon: Rigoberto Noel, MD;  Location: [facility];  Service: Cardiopulmonary;  Laterality: Bilateral;     Medications Prior to Admission: Prior to Admission medications   Medication Sig Start Date End Date Taking? Authorizing Provider  albuterol (PROVENTIL) (2.5 MG/3ML) 0.083% nebulizer solution INHALE CONTENTS OF ONE VIAL VIA NEBULIZER EVERY 4 HOURS AS NEEDED 05/21/18   Plotnikov, Evie Lacks, MD  Biotin 5000 MCG CAPS Take 5,000 mcg by mouth daily.     [provider]  buPROPion (WELLBUTRIN XL) 300 MG 24 hr tablet Take 1 tablet (300 mg total) by mouth every morning. Must keep upcoming appt to get refills 08/23/18   Plotnikov, Evie Lacks, MD  Cholecalciferol 2000 units TABS Take 2,000 Units by mouth daily.     [provider]  doxycycline (VIBRA-TABS) 100 MG tablet Take 1 tablet (100 mg total) by mouth 2 (two) times daily. 08/26/18   Rigoberto Noel, MD  estrogen-methylTESTOSTERone  0.625-1.25 MG tablet Take 1 tablet by mouth daily.    [provider]  feeding supplement, ENSURE ENLIVE, (ENSURE ENLIVE) LIQD Take 237 mLs by mouth 2 (two) times daily between meals. Patient taking differently: Take 237 mLs by mouth 3 (three) times a week.  05/12/15   Hendricks Limes, MD  fexofenadine (ALLEGRA) 180 MG tablet Take 180 mg by mouth daily.    [provider]  finasteride (PROSCAR) 5 MG tablet Take 2.5 mg by mouth daily.     [provider]  Flaxseed, Linseed, (FLAXSEED OIL) 1000 MG CAPS Take 1,000 mg by mouth 2 (two) times daily.      [provider]  fluticasone (FLONASE) 50 MCG/ACT nasal spray USE 1 SPRAY IN EACH NOSTRIL TWO TIMES A DAY AS NEEDED Patient taking differently: USE 1 SPRAY IN EACH NOSTRIL TWO TIMES A DAY 12/07/17   Plotnikov, Evie Lacks, MD  fluticasone furoate-vilanterol (BREO ELLIPTA) 100-25 MCG/INH AEPB USE 1 INHALATION DAILY 03/26/18   Rigoberto Noel, MD  guaiFENesin (MUCINEX) 600 MG 12 hr tablet Take 1 tablet (600 mg total) by mouth 2 (two) times daily. 01/17/18   Rigoberto Noel, MD  ibuprofen (ADVIL,MOTRIN) 200 MG tablet Take 400 mg by mouth every 6 (six) hours as needed for headache or moderate pain.    [provider]  Ipratropium-Albuterol (COMBIVENT RESPIMAT) 20-100 MCG/ACT AERS respimat Inhale 1 puff into the lungs 4 (four) times daily as needed for wheezing. 04/27/17   Plotnikov, Evie Lacks, MD  ipratropium-albuterol (DUONEB) 0.5-2.5 (3) MG/3ML SOLN INHALE CONTENTS OF 1 VIAL INTO THE LUNGS VIA NEBULIZER EVERY 6 (SIX) HOURS AS NEEDED 07/22/18   Rigoberto Noel, MD  LORazepam (ATIVAN) 0.5 MG tablet Take 1-2 tablets (0.5-1 mg total) by mouth at bedtime as needed for anxiety. Patient needs office visit before refills will be given 08/26/18   Plotnikov, Evie Lacks, MD  metoprolol tartrate (LOPRESSOR) 25 MG tablet Take 0.5 tablets (12.5 mg total) by mouth daily. Patient taking differently: Take 12.5 mg by mouth 2 (two) times daily.  01/07/18   Fay Records, MD  metoprolol tartrate (LOPRESSOR) 25 MG tablet TAKE 0.5 TABLETS BY MOUTH 2 TIMES DAILY. 07/05/18   Plotnikov, Evie Lacks, MD  minoxidil (ROGAINE) 2 % external solution Apply 1 application topically as needed (for hair loss).     [provider]  Multiple Vitamins-Minerals (ONE-A-DAY 50 PLUS PO) Take 1 tablet by mouth daily.     [provider]  OVER THE COUNTER MEDICATION Take 1 tablet by mouth 2 (two) times daily. Nutrafol Supplement    [provider]  predniSONE (DELTASONE) 10 MG tablet Take 2 tabs daily with food x5 days,  then 1 tab daily with food x5 days, then stop Patient not taking: Reported on 06/07/2018 05/31/18   Rigoberto Noel, MD  Respiratory Therapy Supplies (FLUTTER) DEVI Use a directed. 04/11/18   Rigoberto Noel, MD  simvastatin (ZOCOR) 20 MG tablet TAKE 1 TABLET BY MOUTH  DAILY AT 6 PM. Patient taking differently: TAKE 20 MG BY MOUTH  DAILY AT BEDTIME 12/24/17   Fay Records, MD  UNABLE TO FIND Take 1 scoop by mouth daily. Med Name: Collagen Peptides     [provider]     Allergies:    Allergies  Allergen Reactions  . Flagyl [Metronidazole] Other (See Comments)    Nerve pain  . Levofloxacin Rash  . Loperamide Hcl Rash  . Neomycin-Bacitracin Zn-Polymyx Rash  . Ofloxacin Rash  .  Sulfonamide Derivatives Hives  . Band-Aid Liquid Bandage [New Skin] Other (See Comments)    Regular band-aid cause itching and redness  . Collodion Dermatitis  . Gabapentin Other (See Comments)    Mental status changes  . Latex Other (See Comments) and Dermatitis    "redness"  . Sulfa Antibiotics Hives  . Oxycontin [Oxycodone Hcl] Rash         Social History:   Social History   Socioeconomic History  . Marital status: Married    Spouse name: Not on file  . Number of children: 1  . Years of education: Not on file  . Highest education level: Not on file  Occupational History    Employer: [employer]  . Financial resource strain: Not on file  . Food insecurity:    Worry: Not on file    Inability: Not on file  . Transportation needs:    Medical: Not on file    Non-medical: Not on file  Tobacco Use  . Smoking status: Former Smoker    Packs/day: 1.00    Years: 30.00    Pack years: 30.00    Types: Cigarettes    Last attempt to quit: 12/04/2001    Years since quitting: 16.8  . Smokeless tobacco: Never Used  . Tobacco comment:  smoked 1973-2003, up to 1.5 ppd  Substance and Sexual Activity  . Alcohol use: Yes    Alcohol/week: 0.0 standard drinks    Comment: occasionally    . Drug use: No  . Sexual activity: Not on file  Lifestyle  . Physical activity:    Days per week: Not on file    Minutes per session: Not on file  . Stress: Not on file  Relationships  . Social connections:    Talks on phone: Not on file    Gets together: Not on file    Attends religious service: Not on file    Active member of club or organization: Not on file    Attends meetings of clubs or organizations: Not on file    Relationship status: Not on file  . Intimate partner violence:    Fear of current or ex partner: Not on file    Emotionally abused: Not on file    Physically abused: Not on file    Forced sexual activity: Not on file  Other Topics Concern  . Not on file  Social History Narrative  . Not on file    Family History:  The patient's family history includes Anemia in her brother; Asthma in her maternal aunt, maternal grandmother, mother, and sister; Cirrhosis in her brother and mother; Colitis in her mother; Colon cancer in her father and sister; Colon polyps in her brother; Diabetes in her father; Heart attack (age of onset: 77) in her sister; Heart attack (age of onset: 79) in her father; Kidney failure in her unknown relative; Prostate cancer in her brother; Stroke (age of onset: 20) in her father. There is no history of Breast cancer.    ROS:  Please see the history of present illness.  All other ROS reviewed and negative.     Physical Exam/Data:   Vitals:   09/26/18 1500 09/26/18 1515 09/26/18 1530 09/26/18 1600  BP: (!) 154/85 (!) 150/85 (!) 154/84   Pulse: 76 82 78   Resp: 13 17 14    Temp:      TempSrc:      SpO2: 100% 100% 100%   Weight:  64.2 kg   No intake or output data in the 24 hours ending 09/26/18 1635 Filed Weights   09/26/18 1600  Weight: 64.2 kg   Body mass index is 22.84 kg/m.  General:  Well nourished, well developed, in no acute distress HEENT: normal Lymph: no adenopathy Neck: no JVD Endocrine:  No thryomegaly Vascular: No  carotid bruits; FA pulses 2+ bilaterally without bruits  Cardiac:  normal S1, S2; RRR; no murmur  Lungs:  clear to auscultation bilaterally, no wheezing, rhonchi or rales  Abd: soft, nontender, no hepatomegaly  Ext: no edema Musculoskeletal:  No deformities, BUE and BLE strength normal and equal Skin: warm and dry  Neuro:  CNs 2-12 intact, no focal abnormalities noted Psych:  Normal affect   Relevant CV Studies: As above  Laboratory Data:  Chemistry Recent Labs  Lab 09/26/18 1530  NA 133*  K 3.7  CL 100  CO2 26  GLUCOSE 90  BUN 7*  CREATININE 0.81  CALCIUM 9.1  GFRNONAA >60  GFRAA >60  ANIONGAP 7    Recent Labs  Lab 09/26/18 1530  PROT 7.0  ALBUMIN 4.0  AST 46*  ALT 40  ALKPHOS 108  BILITOT 0.4   Hematology Recent Labs  Lab 09/26/18 1530  WBC 8.7  RBC 4.79  HGB 15.4*  HCT 46.2*  MCV 96.5  MCH 32.2  MCHC 33.3  RDW 12.1  PLT 266    Recent Labs  Lab 09/26/18 1539  TROPIPOC 0.26*     DDimer  Recent Labs  Lab 09/26/18 1530  DDIMER 0.86*    Radiology/Studies:  Dg Chest 2 View  Result Date: 09/26/2018 CLINICAL DATA:  Chest pain and cough EXAM: CHEST - 2 VIEW COMPARISON:  Chest CT February 14, 2018; chest radiograph March 04, 2018 FINDINGS: There is stable fibrotic change throughout the lungs bilaterally, most notably in the base regions. There is no frank edema or consolidation. Heart size and pulmonary vascularity are normal. No adenopathy. There is aortic atherosclerosis. No bone lesions. No pneumothorax. IMPRESSION: Stable fibrotic changes throughout the lungs, most notably in the lower lobe regions. No edema or consolidation. Stable cardiac silhouette. No evident adenopathy. Electronically Signed   By: Lowella Grip III M.D.   On: 09/26/2018 15:19    Assessment and Plan:   1. NSTEMI - POC 0.24.  EKG without acute ischemic changes.  D-dimer 0.86.  No recent surgery or travel.  She continues to have 1 out of 10 chest pressure, now starting to  having pain between shoulder blade which is new.  However, says different pain.  Denies radiation.  Start IV heparin and IV nitroglycerin.  Will review with MD for CTA for PE/dissection protocol versus cardiac cath.   2. HLD  - LDL was 60 on 12/2017. Recheck tomorrow. Continue home Zocor for now.   3. HTN - Elevated. Continue home metoprolol. Iv nitro as above.   4. Pulmonary fibrosis - No active issue. Followed by Dr. Elsworth Soho.  Severity of Illness: The appropriate patient status for this patient is INPATIENT. Inpatient status is judged to be reasonable and necessary in order to provide the required intensity of service to ensure the patient's safety. The patient's presenting symptoms, physical exam findings, and initial radiographic and laboratory data in the context of their chronic comorbidities is felt to place them at high risk for further clinical deterioration. Furthermore, it is not anticipated that the patient will be medically stable for discharge from the hospital within 2 midnights of admission. The  following factors support the patient status of inpatient.   " The patient's presenting symptoms include Chest pain radiating to bilateral arms. " The worrisome physical exam findings include None " The initial radiographic and laboratory data are worrisome because of  Troponin and d-dimer.  " The chronic co-morbidities include pulmonary fibrosis   * I certify that at the point of admission it is my clinical judgment that the patient will require inpatient hospital care spanning beyond 2 midnights from the point of admission due to high intensity of service, high risk for further deterioration and high frequency of surveillance required.*    For questions or updates, please contact Lynn Please consult [email or web address] for contact info under   Jarrett Soho, PA  09/26/2018 4:35 PM   The patient was seen, examined and discussed with Bhagat,Bhavinkumar PA-C and I  agree with the above.   62 y.o. female with hx of Pulmonary fibrosis/ bronchiectasis  followed by Dr. Elsworth Soho, coronary calcification by CT, hypertension and hyperlipidemia followed by Dr. Harrington Challenger, former smoker, quit in 2003.  She has a very strong family history of premature coronary artery disease, her father had multiple myocardial infarctions and bypass surgery, his first MI was at age 74. Her sister also had an MI at age of 32 and underwent bypass surgery. Patient was in her usual state of health until this morning when she woke up with retrosternal chest pressure radiating to her neck and back associated with diaphoresis, this has lasted for about 4 hours when it resolved with some pain medication, she later developed another episode of chest pain radiating to her back, that improved with sublingual nitroglycerin, she currently has pain 1 out of 10 but more significant back pain than before and just feels overall very uncomfortable. Her creatinine is normal, her first troponin is 0.23, her EKG shows normal sinus rhythm and is unchanged from prior. We will plan for a cardiac catheterization today.  We will start baby aspirin, continue metoprolol, start atorvastatin 80 mg daily, add lisinopril for blood pressure tomorrow.  Ena Dawley, MD 09/26/2018

## 2018-09-26 NOTE — Progress Notes (Signed)
Sarah Escobar for heparin Indication: chest pain/ACS  Allergies  Allergen Reactions  . Flagyl [Metronidazole] Other (See Comments)    Nerve pain  . Levofloxacin Rash  . Loperamide Hcl Rash  . Neomycin-Bacitracin Zn-Polymyx Rash  . Ofloxacin Rash  . Sulfonamide Derivatives Hives  . Band-Aid Liquid Bandage [New Skin] Other (See Comments)    Regular band-aid cause itching and redness  . Collodion Dermatitis  . Gabapentin Other (See Comments)    Mental status changes  . Latex Other (See Comments) and Dermatitis    "redness"  . Sulfa Antibiotics Hives  . Oxycontin [Oxycodone Hcl] Rash         Patient Measurements: Weight: 141 lb 8.6 oz (64.2 kg)   Vital Signs: Temp: 98.1 F (36.7 C) (10/24 1353) Temp Source: Oral (10/24 1353) BP: 137/88 (10/24 1805) Pulse Rate: 82 (10/24 1805)  Labs: Recent Labs    09/26/18 1530  HGB 15.4*  HCT 46.2*  PLT 266  CREATININE 0.81    Estimated Creatinine Clearance: 67.4 mL/min (by C-G formula based on SCr of 0.81 mg/dL).   Medical History: Past Medical History:  Diagnosis Date  . Allergy   . Asthma    adult onset  . Depression   . Hyperlipidemia    LDL goal = < 100  . Hypertension   . Osteopenia    mild  . Personal history of adenomatous colonic polyps/FHx colon cancer sister and father    [name]  . Sciatica of left side    intermittent    Medications:  Scheduled:  . aspirin  324 mg Oral NOW   Or  . aspirin  300 mg Rectal NOW  . [START ON 09/27/2018] aspirin EC  81 mg Oral Daily  . [START ON 09/27/2018] atorvastatin  80 mg Oral q1800  . Biotin  5,000 mcg Oral Daily  . [START ON 09/27/2018] buPROPion  300 mg Oral q morning - 10a  . Cholecalciferol  2,000 Units Oral Daily  . estrogen-methylTESTOSTERone  1 tablet Oral Daily  . finasteride  2.5 mg Oral Daily  . fluticasone furoate-vilanterol  1 puff Inhalation Daily  . iopamidol      . loratadine  10 mg Oral Daily  .  metoprolol tartrate  12.5 mg Oral BID  . sodium chloride flush  3 mL Intravenous Q12H   Infusions:  . sodium chloride    . sodium chloride    . nitroGLYCERIN      Assessment: Pt is a 70 YOF presenting with chest tightness and bilateral tingling of both arms. Pharmacy has been consulted for heparin for ACS/chest pain.  She is now s/p cath with plans for possible CABG. Pharmacy to restart heparin 2 hours after TR band removed. Heparin was previously infusing at 750 units/hr.  Goal of Therapy:  Heparin level 0.3-0.7 units/ml Monitor platelets by anticoagulation protocol: Yes   Plan:  -Restart heparin at 750 units/hr 2 hours post TR band removal -Heparin level in 6 hours and daily wth CBC daily  Sarah Escobar, PharmD Clinical Pharmacist Please check Amion for pharmacy contact number

## 2018-09-26 NOTE — Interval H&P Note (Signed)
History and Physical Interval Note:  09/26/2018 5:22 PM  Sarah Escobar  has presented today for cardiac catheterization, with the diagnosis of NSTEMI. The various methods of treatment have been discussed with the patient and family. After consideration of risks, benefits and other options for treatment, the patient has consented to  Procedure(s): LEFT HEART CATH AND CORONARY ANGIOGRAPHY (N/A) as a surgical intervention .  The patient's history has been reviewed, patient examined, no change in status, stable for surgery.  I have reviewed the patient's chart and labs.  Questions were answered to the patient's satisfaction.    Cath Lab Visit (complete for each Cath Lab visit)  Clinical Evaluation Leading to the Procedure:   ACS: Yes.    Non-ACS:  N/A  Saia Derossett

## 2018-09-26 NOTE — ED Triage Notes (Signed)
Patient arrived via EMS from home related to "chest tightness" and bilateral tingling of both arms. Pt received 324mg  ASA and 1 Nitro from EMS-> reports that Nitro did not help. On arrival patient reports chest pain of 1 on a 0-10scale

## 2018-09-26 NOTE — ED Provider Notes (Signed)
[place] EMERGENCY DEPARTMENT Provider Note   CSN: [id number] Arrival date & time: 09/26/18  1337     History   Chief Complaint No chief complaint on file.   HPI Sarah Escobar is a 62 y.o. female.  62 year old female, with PMH HTN, HLD, interstitial lung disease, presents with chest pain.  Patient states pain started this morning abruptly 7:30 AM when she was sitting in bed.  She notes pain has spontaneously resolved.  Pain started again at noon when she got up and walked around.  Patient describes pain as a tightness in the midsternal region radiating to bilateral arms.  She states she received 1 nitro and 324 ASA enroute by EMS and her pain has improved at this time.  Patient notes some associated shortness of breath.  She denies any nausea, vomiting, diaphoresis.  Patient also notes that she has had a cough which is been worsening over the last couple of days with associated sinus pressure.  Patient states that she gets sinus infections yearly and sinus pressure today feel the same as previous sinus infections.  Patient is a family history of a father who had an MI at age 1 and a sister who has had multiple MIs.  Patient is on estradiol.  Patient denies any recent travel/trauma/immobilization, peripheral edema.  She denies having ever had a stress test.  She denies history of PE/DVT, MI.  The history is provided by the patient.  Chest Pain   This is a new problem. The pain is present in the substernal region. The pain radiates to the right arm and left arm. Associated symptoms include cough and shortness of breath. Pertinent negatives include no abdominal pain, no fever, no nausea, no numbness, no syncope and no vomiting. Risk factors include hormone replacement therapy.  Her past medical history is significant for hyperlipidemia and hypertension.  Her family medical history is significant for CAD.    Past Medical History:  Diagnosis Date  . Allergy   . Asthma     adult onset  . Depression   . Hyperlipidemia    LDL goal = < 100  . Hypertension   . Osteopenia    mild  . Personal history of adenomatous colonic polyps/FHx colon cancer sister and father    [name]  . Sciatica of left side    intermittent    Patient Active Problem List   Diagnosis Date Noted  . Acute pharyngitis 06/07/2018  . Yellow nails 01/14/2018  . Anxiety 04/30/2017  . Hair loss 01/11/2017  . Well adult exam 01/11/2017  . Skin irritation 01/18/2016  . Elevated LFTs 01/10/2016  . Sleep disorder 06/01/2015  . Coronary artery calcification seen on CAT scan 03/19/2015  . Nonscarring hair loss 09/25/2014  . High serum thyroxine (T4) 09/25/2014  . Bronchiectasis without acute exacerbation ([place]) 04/07/2014  . ILD (interstitial lung disease) ([place]) 02/25/2014  . DDD (degenerative disc disease), lumbosacral 12/24/2012  . Environmental allergies 06/20/2011  . OSTEOPENIA 01/05/2010  . Depression 09/10/2009  . Essential hypertension 09/10/2009  . History of adenomatous polyp of colon 09/10/2009  . Sinusitis, acute maxillary 11/30/2008  . Asthmatic bronchitis 05/28/2008  . HYPERLIPIDEMIA 10/11/2007    Past Surgical History:  Procedure Laterality Date  . BUNIONECTOMY    . CERVICAL FUSION  1999   Dr Arnoldo Morale, [place]  . COLONOSCOPY  2013  . COLONOSCOPY W/ POLYPECTOMY  multiple   adenomas; [name]; last 2013  . discetomy lumbar  02/19/13   L1&2;  Dr Arnoldo Morale, [place]  . NASAL SINUS SURGERY  [phone]   X 3  . POLYPECTOMY  1217--2013   TA+  . TOTAL ABDOMINAL HYSTERECTOMY W/ BILATERAL SALPINGOOPHORECTOMY  2003   endometriosis   . VIDEO BRONCHOSCOPY Bilateral 03/04/2018   Procedure: VIDEO BRONCHOSCOPY WITH FLUORO;  Surgeon: Rigoberto Noel, MD;  Location: [facility];  Service: Cardiopulmonary;  Laterality: Bilateral;     OB History   None      Home Medications    Prior to Admission medications   Medication Sig Start Date End Date Taking? Authorizing Provider    albuterol (PROVENTIL) (2.5 MG/3ML) 0.083% nebulizer solution INHALE CONTENTS OF ONE VIAL VIA NEBULIZER EVERY 4 HOURS AS NEEDED 05/21/18   Plotnikov, Evie Lacks, MD  Biotin 5000 MCG CAPS Take 5,000 mcg by mouth daily.     [provider]  buPROPion (WELLBUTRIN XL) 300 MG 24 hr tablet Take 1 tablet (300 mg total) by mouth every morning. Must keep upcoming appt to get refills 08/23/18   Plotnikov, Evie Lacks, MD  Cholecalciferol 2000 units TABS Take 2,000 Units by mouth daily.     [provider]  doxycycline (VIBRA-TABS) 100 MG tablet Take 1 tablet (100 mg total) by mouth 2 (two) times daily. 08/26/18   Rigoberto Noel, MD  estrogen-methylTESTOSTERone 0.625-1.25 MG tablet Take 1 tablet by mouth daily.    [provider]  feeding supplement, ENSURE ENLIVE, (ENSURE ENLIVE) LIQD Take 237 mLs by mouth 2 (two) times daily between meals. Patient taking differently: Take 237 mLs by mouth 3 (three) times a week.  05/12/15   Hendricks Limes, MD  fexofenadine (ALLEGRA) 180 MG tablet Take 180 mg by mouth daily.    [provider]  finasteride (PROSCAR) 5 MG tablet Take 2.5 mg by mouth daily.     [provider]  Flaxseed, Linseed, (FLAXSEED OIL) 1000 MG CAPS Take 1,000 mg by mouth 2 (two) times daily.     [provider]  fluticasone (FLONASE) 50 MCG/ACT nasal spray USE 1 SPRAY IN EACH NOSTRIL TWO TIMES A DAY AS NEEDED Patient taking differently: USE 1 SPRAY IN EACH NOSTRIL TWO TIMES A DAY 12/07/17   Plotnikov, Evie Lacks, MD  fluticasone furoate-vilanterol (BREO ELLIPTA) 100-25 MCG/INH AEPB USE 1 INHALATION DAILY 03/26/18   Rigoberto Noel, MD  guaiFENesin (MUCINEX) 600 MG 12 hr tablet Take 1 tablet (600 mg total) by mouth 2 (two) times daily. 01/17/18   Rigoberto Noel, MD  ibuprofen (ADVIL,MOTRIN) 200 MG tablet Take 400 mg by mouth every 6 (six) hours as needed for headache or moderate pain.    [provider]  Ipratropium-Albuterol (COMBIVENT RESPIMAT)  20-100 MCG/ACT AERS respimat Inhale 1 puff into the lungs 4 (four) times daily as needed for wheezing. 04/27/17   Plotnikov, Evie Lacks, MD  ipratropium-albuterol (DUONEB) 0.5-2.5 (3) MG/3ML SOLN INHALE CONTENTS OF 1 VIAL INTO THE LUNGS VIA NEBULIZER EVERY 6 (SIX) HOURS AS NEEDED 07/22/18   Rigoberto Noel, MD  LORazepam (ATIVAN) 0.5 MG tablet Take 1-2 tablets (0.5-1 mg total) by mouth at bedtime as needed for anxiety. Patient needs office visit before refills will be given 08/26/18   Plotnikov, Evie Lacks, MD  metoprolol tartrate (LOPRESSOR) 25 MG tablet Take 0.5 tablets (12.5 mg total) by mouth daily. Patient taking differently: Take 12.5 mg by mouth 2 (two) times daily.  01/07/18   Fay Records, MD  metoprolol tartrate (LOPRESSOR) 25 MG tablet TAKE 0.5 TABLETS BY MOUTH 2 TIMES DAILY.  07/05/18   Plotnikov, Evie Lacks, MD  minoxidil (ROGAINE) 2 % external solution Apply 1 application topically as needed (for hair loss).     [provider]  Multiple Vitamins-Minerals (ONE-A-DAY 50 PLUS PO) Take 1 tablet by mouth daily.     [provider]  OVER THE COUNTER MEDICATION Take 1 tablet by mouth 2 (two) times daily. Nutrafol Supplement    [provider]  predniSONE (DELTASONE) 10 MG tablet Take 2 tabs daily with food x5 days, then 1 tab daily with food x5 days, then stop Patient not taking: Reported on 06/07/2018 05/31/18   Rigoberto Noel, MD  Respiratory Therapy Supplies (FLUTTER) DEVI Use a directed. 04/11/18   Rigoberto Noel, MD  simvastatin (ZOCOR) 20 MG tablet TAKE 1 TABLET BY MOUTH  DAILY AT 6 PM. Patient taking differently: TAKE 20 MG BY MOUTH  DAILY AT BEDTIME 12/24/17   Fay Records, MD  UNABLE TO FIND Take 1 scoop by mouth daily. Med Name: Collagen Peptides     [provider]    Family History Family History  Problem Relation Age of Onset  . Colon cancer Father   . Heart attack Father 64  . Diabetes Father   . Stroke Father 36  . Colon cancer Sister   . Asthma  Sister   . Asthma Mother   . Colitis Mother   . Cirrhosis Mother        non alcoholic, fatty liver  . Asthma Maternal Aunt   . Heart attack Sister 74  . Cirrhosis Brother        non alcoholic  . Colon polyps Brother   . Anemia Brother         Spherocytosis, hereditrary  . Asthma Maternal Grandmother   . Kidney failure Unknown        2 bro, 1 sister ? from HTN  . Prostate cancer Brother   . Breast cancer Neg Hx     Social History Social History   Tobacco Use  . Smoking status: Former Smoker    Packs/day: 1.00    Years: 30.00    Pack years: 30.00    Types: Cigarettes    Last attempt to quit: 12/04/2001    Years since quitting: 16.8  . Smokeless tobacco: Never Used  . Tobacco comment:  smoked 1973-2003, up to 1.5 ppd  Substance Use Topics  . Alcohol use: Yes    Alcohol/week: 0.0 standard drinks    Comment: occasionally  . Drug use: No     Allergies   Flagyl [metronidazole]; Levofloxacin; Loperamide hcl; Neomycin-bacitracin zn-polymyx; Ofloxacin; Sulfonamide derivatives; Band-aid liquid bandage [new skin]; Collodion; Gabapentin; Latex; Sulfa antibiotics; and Oxycontin [oxycodone hcl]   Review of Systems Review of Systems  Constitutional: Negative for chills and fever.  HENT: Positive for rhinorrhea and sinus pressure. Negative for sore throat.   Eyes: Negative for visual disturbance.  Respiratory: Positive for cough and shortness of breath.   Cardiovascular: Positive for chest pain. Negative for leg swelling and syncope.  Gastrointestinal: Negative for abdominal pain, diarrhea, nausea and vomiting.  Genitourinary: Negative for dysuria, frequency and urgency.  Musculoskeletal: Negative for joint swelling and neck pain.  Skin: Negative for rash and wound.  Neurological: Negative for syncope and numbness.  All other systems reviewed and are negative.    Physical Exam Updated Vital Signs BP (!) 153/91   Pulse 74   Temp 98.1 F (36.7 C) (Oral)   Resp 15   SpO2  100%  Physical Exam  Constitutional: She is oriented to person, place, and time. She appears well-developed and well-nourished.  HENT:  Head: Normocephalic and atraumatic.  Eyes: Conjunctivae and EOM are normal.  Neck: Neck supple.  Cardiovascular: Normal rate, regular rhythm and normal heart sounds.  No murmur heard. Pulmonary/Chest: Effort normal. No respiratory distress. She has no wheezes. She has rales in the right lower field and the left lower field.  Abdominal: Soft. Bowel sounds are normal. She exhibits no distension. There is no tenderness.  Musculoskeletal: Normal range of motion. She exhibits no tenderness or deformity.  Neurological: She is alert and oriented to person, place, and time.  Skin: Skin is warm and dry. No rash noted. No erythema.  Psychiatric: She has a normal mood and affect. Her behavior is normal.  Nursing note and vitals reviewed.    ED Treatments / Results  Labs (all labs ordered are listed, but only abnormal results are displayed) Labs Reviewed  CBC WITH DIFFERENTIAL/PLATELET  COMPREHENSIVE METABOLIC PANEL  D-DIMER, QUANTITATIVE (NOT AT [facility])    EKG EKG Interpretation  Date/Time:  Thursday September 26 2018 13:54:40 EDT Ventricular Rate:  72 PR Interval:    QRS Duration: 93 QT Interval:  369 QTC Calculation: 404 R Axis:   45 Text Interpretation:  Sinus rhythm Probable left atrial enlargement Anteroseptal infarct, age indeterminate No previous ECGs available Confirmed by Gareth Morgan [phone]) on 09/26/2018 2:02:18 PM   Radiology Dg Chest 2 View  Result Date: 09/26/2018 CLINICAL DATA:  Chest pain and cough EXAM: CHEST - 2 VIEW COMPARISON:  Chest CT February 14, 2018; chest radiograph March 04, 2018 FINDINGS: There is stable fibrotic change throughout the lungs bilaterally, most notably in the base regions. There is no frank edema or consolidation. Heart size and pulmonary vascularity are normal. No adenopathy. There is aortic atherosclerosis.  No bone lesions. No pneumothorax. IMPRESSION: Stable fibrotic changes throughout the lungs, most notably in the lower lobe regions. No edema or consolidation. Stable cardiac silhouette. No evident adenopathy. Electronically Signed   By: Lowella Grip III M.D.   On: 09/26/2018 15:19    Procedures .Critical Care Performed by: Etter Sjogren, PA-C Authorized by: Etter Sjogren, PA-C   Critical care provider statement:    Critical care time (minutes):  45   Critical care time was exclusive of:  Separately billable procedures and treating other patients   Critical care was necessary to treat or prevent imminent or life-threatening deterioration of the following conditions:  Cardiac failure and circulatory failure   Critical care was time spent personally by me on the following activities:  Discussions with consultants, evaluation of patient's response to treatment, examination of patient, ordering and performing treatments and interventions, ordering and review of laboratory studies, ordering and review of radiographic studies, pulse oximetry, re-evaluation of patient's condition, obtaining history from patient or surrogate and review of old charts   I assumed direction of critical care for this patient from another provider in my specialty: no     (including critical care time)  Medications Ordered in ED Medications  nitroGLYCERIN (NITROSTAT) SL tablet 0.4 mg (has no administration in time range)     Initial Impression / Assessment and Plan / ED Course  I have reviewed the triage vital signs and the nursing notes.  Pertinent labs & imaging results that were available during my care of the patient were reviewed by me and considered in my medical decision making (see chart for details).     3:57 PM  Pt's troponin is elevated. Consult placed to cardiology. Heparin drip ordered for ACS protocol. 4:16 PM Cardiology consulted and here to see the pt. DDI also elevated. Will add  imaging for CT angio chest to rule out PE. Critical care was preformed on this patient due to the risk of life threatening injury from an NSTEMI and the need to start heparin constant infusion.   Final Clinical Impressions(s) / ED Diagnoses   Final diagnoses:  None    ED Discharge Orders    None       Etter Sjogren, PA-C 09/27/18 0156    Isla Pence, MD 09/28/18 1050

## 2018-09-27 ENCOUNTER — Encounter (HOSPITAL_COMMUNITY): Payer: Self-pay | Admitting: Internal Medicine

## 2018-09-27 ENCOUNTER — Inpatient Hospital Stay (HOSPITAL_COMMUNITY): Payer: 59

## 2018-09-27 ENCOUNTER — Other Ambulatory Visit: Payer: Self-pay | Admitting: *Deleted

## 2018-09-27 DIAGNOSIS — J984 Other disorders of lung: Secondary | ICD-10-CM

## 2018-09-27 DIAGNOSIS — I2510 Atherosclerotic heart disease of native coronary artery without angina pectoris: Secondary | ICD-10-CM

## 2018-09-27 DIAGNOSIS — I5030 Unspecified diastolic (congestive) heart failure: Secondary | ICD-10-CM

## 2018-09-27 DIAGNOSIS — I10 Essential (primary) hypertension: Secondary | ICD-10-CM

## 2018-09-27 DIAGNOSIS — I25110 Atherosclerotic heart disease of native coronary artery with unstable angina pectoris: Secondary | ICD-10-CM

## 2018-09-27 LAB — PULMONARY FUNCTION TEST
FEF 25-75 POST: 3.43 L/s
FEF 25-75 Pre: 3.92 L/sec
FEF2575-%Change-Post: -12 %
FEF2575-%PRED-POST: 144 %
FEF2575-%PRED-PRE: 164 %
FEV1-%Change-Post: -6 %
FEV1-%PRED-PRE: 71 %
FEV1-%Pred-Post: 66 %
FEV1-POST: 1.8 L
FEV1-Pre: 1.92 L
FEV1FVC-%Change-Post: 0 %
FEV1FVC-%Pred-Pre: 117 %
FEV6-%CHANGE-POST: -6 %
FEV6-%PRED-POST: 58 %
FEV6-%Pred-Pre: 62 %
FEV6-POST: 1.98 L
FEV6-Pre: 2.1 L
FEV6FVC-%PRED-POST: 103 %
FEV6FVC-%Pred-Pre: 103 %
FVC-%Change-Post: -6 %
FVC-%Pred-Post: 56 %
FVC-%Pred-Pre: 60 %
FVC-Post: 1.98 L
FVC-Pre: 2.1 L
POST FEV6/FVC RATIO: 100 %
PRE FEV1/FVC RATIO: 91 %
Post FEV1/FVC ratio: 91 %
Pre FEV6/FVC Ratio: 100 %

## 2018-09-27 LAB — BASIC METABOLIC PANEL
Anion gap: 12 (ref 5–15)
BUN: 6 mg/dL — ABNORMAL LOW (ref 8–23)
CO2: 19 mmol/L — ABNORMAL LOW (ref 22–32)
CREATININE: 0.74 mg/dL (ref 0.44–1.00)
Calcium: 8.8 mg/dL — ABNORMAL LOW (ref 8.9–10.3)
Chloride: 102 mmol/L (ref 98–111)
GFR calc Af Amer: >60 mL/min (ref >60)
Glucose, Bld: 81 mg/dL (ref 70–99)
POTASSIUM: 3.9 mmol/L (ref 3.5–5.1)
SODIUM: 133 mmol/L — AB (ref 135–145)

## 2018-09-27 LAB — LIPID PANEL
Cholesterol: 120 mg/dL (ref 0–200)
HDL: 36 mg/dL — ABNORMAL LOW (ref >40)
LDL CALC: 70 mg/dL (ref 0–99)
Total CHOL/HDL Ratio: 3.3 RATIO
Triglycerides: 68 mg/dL (ref <150)
VLDL: 14 mg/dL (ref 0–40)

## 2018-09-27 LAB — CBC
HCT: 42.1 % (ref 36.0–46.0)
Hemoglobin: 13.8 g/dL (ref 12.0–15.0)
MCH: 31.7 pg (ref 26.0–34.0)
MCHC: 32.8 g/dL (ref 30.0–36.0)
MCV: 96.6 fL (ref 80.0–100.0)
Platelets: 214 10*3/uL (ref 150–400)
RBC: 4.36 MIL/uL (ref 3.87–5.11)
RDW: 12.2 % (ref 11.5–15.5)
WBC: 9.6 10*3/uL (ref 4.0–10.5)
nRBC: 0 % (ref 0.0–0.2)

## 2018-09-27 LAB — HEPARIN LEVEL (UNFRACTIONATED)
Heparin Unfractionated: 0.16 IU/mL — ABNORMAL LOW (ref 0.30–0.70)
Heparin Unfractionated: 0.3 IU/mL (ref 0.30–0.70)

## 2018-09-27 LAB — ECHOCARDIOGRAM COMPLETE: WEIGHTICAEL: 2264.57 [oz_av]

## 2018-09-27 LAB — HIV ANTIBODY (ROUTINE TESTING W REFLEX): HIV SCREEN 4TH GENERATION: NONREACTIVE

## 2018-09-27 LAB — TROPONIN I
TROPONIN I: 9.8 ng/mL — AB (ref <0.03)
Troponin I: 4.25 ng/mL (ref <0.03)

## 2018-09-27 MED ORDER — SODIUM CHLORIDE 0.9% FLUSH: 3.0000 mL | INTRAVENOUS | Status: DC | PRN

## 2018-09-27 MED ORDER — ALBUTEROL SULFATE (2.5 MG/3ML) 0.083% IN NEBU
2.5000 mg | INHALATION_SOLUTION | Freq: Once | RESPIRATORY_TRACT | Status: AC
  Administered 2018-09-27: 2.5 mg via RESPIRATORY_TRACT

## 2018-09-27 MED ORDER — SODIUM CHLORIDE 0.9 % IV SOLN: 250.0000 mL | INTRAVENOUS | Status: DC | PRN

## 2018-09-27 MED ORDER — LORAZEPAM 0.5 MG PO TABS
0.5000 mg | ORAL_TABLET | Freq: Four times a day (QID) | ORAL | Status: DC | PRN
  Administered 2018-09-27 – 2018-10-22 (×34): 0.5 mg via ORAL
  Filled 2018-09-27 (×36): qty 1

## 2018-09-27 MED ORDER — LOSARTAN POTASSIUM 25 MG PO TABS
25.0000 mg | ORAL_TABLET | Freq: Every day | ORAL | Status: DC
  Administered 2018-09-27 – 2018-09-28 (×2): 25 mg via ORAL
  Filled 2018-09-27 (×2): qty 1

## 2018-09-27 MED ORDER — MORPHINE SULFATE (PF) 2 MG/ML IV SOLN
2.0000 mg | INTRAVENOUS | Status: DC | PRN
  Administered 2018-09-27 – 2018-09-28 (×2): 2 mg via INTRAVENOUS
  Filled 2018-09-27 (×2): qty 1

## 2018-09-27 MED ORDER — ASPIRIN 81 MG PO CHEW
81.0000 mg | CHEWABLE_TABLET | ORAL | Status: AC
  Administered 2018-09-28: 81 mg via ORAL
  Filled 2018-09-27: qty 1

## 2018-09-27 MED ORDER — SODIUM CHLORIDE 0.9 % WEIGHT BASED INFUSION: 1.0000 mL/kg/h | INTRAVENOUS | Status: DC

## 2018-09-27 MED ORDER — SODIUM CHLORIDE 0.9 % WEIGHT BASED INFUSION: 3.0000 mL/kg/h | INTRAVENOUS | Status: AC

## 2018-09-27 MED ORDER — SODIUM CHLORIDE 0.9% FLUSH
3.0000 mL | Freq: Two times a day (BID) | INTRAVENOUS | Status: DC
  Administered 2018-09-27: 3 mL via INTRAVENOUS

## 2018-09-27 NOTE — Progress Notes (Signed)
[facility] for heparin Indication: chest pain/ACS  Allergies  Allergen Reactions  . Flagyl [Metronidazole] Other (See Comments)    Nerve pain  . Levofloxacin Rash  . Loperamide Hcl Rash  . Neomycin-Bacitracin Zn-Polymyx Rash  . Ofloxacin Rash  . Sulfonamide Derivatives Hives  . Band-Aid Liquid Bandage [New Skin] Other (See Comments)    Regular band-aid cause itching and redness  . Collodion Dermatitis  . Gabapentin Other (See Comments)    Mental status changes  . Latex Other (See Comments) and Dermatitis    "redness"  . Sulfa Antibiotics Hives  . Oxycontin [Oxycodone Hcl] Rash         Patient Measurements: Weight: 141 lb 8.6 oz (64.2 kg)   Vital Signs: Temp: 97.6 F (36.4 C) (10/25 1147) Temp Source: Oral (10/25 1147) BP: 114/74 (10/25 1600) Pulse Rate: 78 (10/25 1600)  Labs: Recent Labs    09/26/18 1530 09/26/18 1939 09/27/18 0058 09/27/18 0638 09/27/18 1550  HGB 15.4*  --   --  13.8  --   HCT 46.2*  --   --  42.1  --   PLT 266  --   --  214  --   HEPARINUNFRC  --   --   --  0.16* 0.30  CREATININE 0.81  --   --  0.74  --   TROPONINI  --  6.55* 9.80* 4.25*  --     Estimated Creatinine Clearance: 68.3 mL/min (by C-G formula based on SCr of 0.74 mg/dL).   Medical History: Past Medical History:  Diagnosis Date  . Allergy   . Asthma    adult onset  . Depression   . Hyperlipidemia    LDL goal = < 100  . Hypertension   . Osteopenia    mild  . Personal history of adenomatous colonic polyps/FHx colon cancer sister and father    [name]  . Sciatica of left side    intermittent    Medications:  Scheduled:  . aspirin  324 mg Oral NOW   Or  . aspirin  300 mg Rectal NOW  . [START ON 09/28/2018] aspirin  81 mg Oral Pre-Cath  . aspirin EC  81 mg Oral Daily  . atorvastatin  80 mg Oral q1800  . buPROPion  300 mg Oral q morning - 10a  . cholecalciferol  2,000 Units Oral Daily  . finasteride  2.5 mg Oral Daily   . fluticasone furoate-vilanterol  1 puff Inhalation Daily  . loratadine  10 mg Oral Daily  . losartan  25 mg Oral Daily  . metoprolol tartrate  12.5 mg Oral BID  . sodium chloride flush  3 mL Intravenous Q12H  . sodium chloride flush  3 mL Intravenous Q12H   Infusions:  . sodium chloride    . sodium chloride 10 mL/hr at 09/27/18 1600  . [START ON 09/28/2018] sodium chloride     Followed by  . [START ON 09/28/2018] sodium chloride    . heparin 900 Units/hr (09/27/18 1600)  . nitroGLYCERIN Stopped (09/26/18 2048)    Assessment: Pt is a 34 YOF presenting with chest tightness and bilateral tingling of both arms. Pharmacy has been consulted for heparin for ACS/chest pain.  She is now s/p cath with plans for possible CABG. Pharmacy has restarted heparin. Heparin level now in therapeutic range. No bleeding or IV issues noted.   Goal of Therapy:  Heparin level 0.3-0.7 units/ml Monitor platelets by anticoagulation protocol: Yes   Plan:  -  Continue IV heparin at 900 units/hr -Daily heparin level and CBC.  Marguerite Olea, [facility] Clinical Pharmacist Phone [phone]  09/27/2018 5:34 PM

## 2018-09-27 NOTE — Progress Notes (Signed)
  Echocardiogram 2D Echocardiogram has been performed.  Sarah Escobar 09/27/2018, 4:37 PM

## 2018-09-27 NOTE — Progress Notes (Addendum)
[place] for heparin Indication: chest pain/ACS  Allergies  Allergen Reactions  . Flagyl [Metronidazole] Other (See Comments)    Nerve pain  . Levofloxacin Rash  . Loperamide Hcl Rash  . Neomycin-Bacitracin Zn-Polymyx Rash  . Ofloxacin Rash  . Sulfonamide Derivatives Hives  . Band-Aid Liquid Bandage [New Skin] Other (See Comments)    Regular band-aid cause itching and redness  . Collodion Dermatitis  . Gabapentin Other (See Comments)    Mental status changes  . Latex Other (See Comments) and Dermatitis    "redness"  . Sulfa Antibiotics Hives  . Oxycontin [Oxycodone Hcl] Rash         Patient Measurements: Weight: 141 lb 8.6 oz (64.2 kg)   Vital Signs: Temp: 97.4 F (36.3 C) (10/25 0819) Temp Source: Oral (10/25 0819) BP: 136/91 (10/25 0800) Pulse Rate: 84 (10/25 0800)  Labs: Recent Labs    09/26/18 1530 09/26/18 1939 09/27/18 0058 09/27/18 0638  HGB 15.4*  --   --  13.8  HCT 46.2*  --   --  42.1  PLT 266  --   --  214  HEPARINUNFRC  --   --   --  0.16*  CREATININE 0.81  --   --   --   TROPONINI  --  6.55* 9.80*  --     Estimated Creatinine Clearance: 67.4 mL/min (by C-G formula based on SCr of 0.81 mg/dL).   Medical History: Past Medical History:  Diagnosis Date  . Allergy   . Asthma    adult onset  . Depression   . Hyperlipidemia    LDL goal = < 100  . Hypertension   . Osteopenia    mild  . Personal history of adenomatous colonic polyps/FHx colon cancer sister and father    [name]  . Sciatica of left side    intermittent    Medications:  Scheduled:  . aspirin  324 mg Oral NOW   Or  . aspirin  300 mg Rectal NOW  . aspirin EC  81 mg Oral Daily  . atorvastatin  80 mg Oral q1800  . buPROPion  300 mg Oral q morning - 10a  . cholecalciferol  2,000 Units Oral Daily  . estrogen-methylTESTOSTERone  1 tablet Oral Daily  . finasteride  2.5 mg Oral Daily  . fluticasone furoate-vilanterol  1 puff  Inhalation Daily  . loratadine  10 mg Oral Daily  . metoprolol tartrate  12.5 mg Oral BID  . sodium chloride flush  3 mL Intravenous Q12H   Infusions:  . sodium chloride 10 mL/hr at 09/27/18 0700  . heparin 750 Units/hr (09/27/18 0700)  . nitroGLYCERIN Stopped (09/26/18 2048)    Assessment: Pt is a 34 YOF presenting with chest tightness and bilateral tingling of both arms. Pharmacy has been consulted for heparin for ACS/chest pain.  She is now s/p cath with plans for possible CABG. Pharmacy has restarted heparin. Heparin level low this morning, will adjust. No bleeding or IV issues noted.   Goal of Therapy:  Heparin level 0.3-0.7 units/ml Monitor platelets by anticoagulation protocol: Yes   Plan:  -Increase heparin to 900 units/hr -Heparin level in 6 hours and daily wth CBC daily  Erin Hearing PharmD., BCPS Clinical Pharmacist 09/27/2018 8:35 AM

## 2018-09-27 NOTE — Consult Note (Signed)
[address]       [place] [address]             [phone]      Cardiothoracic Surgery Consultation  Reason for Consult: Severe 2-vessel coronary artery disease Referring Physician: Dr. Harrell Gave End  Chelbie Jarnagin Zambrana is an 62 y.o. female.  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 Elsworth Soho who was in her usual state of health until yesterday morning when she woke up with substernal chest pressure radiating into her neck and back as well as down both arms.  This was associated with diaphoresis and lasted for about 4 hours.  She took some pain medication and it resolved.  She developed another episode around noon with worsening symptoms and came to the emergency room.  Her initial troponin was 0.26 and the second was 6.55 with a peak of 9.8.  Letter cardiogram showed no acute ischemic changes.  Cardiac catheterization was performed late yesterday afternoon by Dr. Saunders Revel and showed an 80% ostial and proximal LAD stenosis and a heavily calcified segment.  There is also 70% mid right coronary stenosis and a large dominant vessel.  Left ventricular systolic function was normal with an end-diastolic pressure of 6 mmHg.  She has had no further symptoms since catheterization.  She lives in [place] with her husband.  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  She reports that she has noted a decreased energy level for some time.  She has had occasional twinges of chest discomfort but nothing lasting more than a few seconds.  She has some chronic shortness of breath with exertion related to her lung disease with mucus production and coughing.  She has no history of pneumonia.  Her father had multiple myocardial infarctions and bypass  surgery with his first myocardial infarction at age 67.  She has a sister who had myocardial infarction age 58 and underwent bypass surgery.  Past Medical History:  Diagnosis Date  . Allergy   . Asthma    adult onset  . Depression   . Hyperlipidemia    LDL goal = < 100  . Hypertension   . Osteopenia    mild  . Personal history of adenomatous colonic polyps/FHx colon cancer sister and father    [name]  . Sciatica of left side    intermittent    Past Surgical History:  Procedure Laterality Date  . BUNIONECTOMY    . CERVICAL FUSION  1999   Dr Arnoldo Morale, [place]  . COLONOSCOPY  2013  . COLONOSCOPY W/ POLYPECTOMY  multiple   adenomas; [name]; last 2013  . discetomy lumbar  02/19/13   L1&2; Dr Arnoldo Morale, [place]  . LEFT HEART CATH AND CORONARY ANGIOGRAPHY N/A 09/26/2018   Procedure: LEFT HEART CATH AND CORONARY ANGIOGRAPHY;  Surgeon: Nelva Bush, MD;  Location: [facility];  Service: Cardiovascular;  Laterality: N/A;  . NASAL SINUS SURGERY  [phone]   X 3  . POLYPECTOMY  1217--2013   TA+  . TOTAL ABDOMINAL HYSTERECTOMY W/ BILATERAL SALPINGOOPHORECTOMY  2003   endometriosis   . VIDEO BRONCHOSCOPY Bilateral 03/04/2018   Procedure: VIDEO BRONCHOSCOPY WITH FLUORO;  Surgeon: Rigoberto Noel, MD;  Location: [facility];  Service: Cardiopulmonary;  Laterality: Bilateral;  Family History  Problem Relation Age of Onset  . Colon cancer Father   . Heart attack Father 62  . Diabetes Father   . Stroke Father 27  . Colon cancer Sister   . Asthma Sister   . Asthma Mother   . Colitis Mother   . Cirrhosis Mother        non alcoholic, fatty liver  . Asthma Maternal Aunt   . Heart attack Sister 109  . Cirrhosis Brother        non alcoholic  . Colon polyps Brother   . Anemia Brother         Spherocytosis, hereditrary  . Asthma Maternal Grandmother   . Kidney failure Unknown        2 bro, 1 sister ? from HTN  . Prostate cancer Brother   . Breast cancer Neg Hx      Social History:  reports that she quit smoking about 16 years ago. Her smoking use included cigarettes. She has a 30.00 pack-year smoking history. She has never used smokeless tobacco. She reports that she drinks alcohol. She reports that she does not use drugs.  Allergies:  Allergies  Allergen Reactions  . Flagyl [Metronidazole] Other (See Comments)    Nerve pain  . Levofloxacin Rash  . Loperamide Hcl Rash  . Neomycin-Bacitracin Zn-Polymyx Rash  . Ofloxacin Rash  . Sulfonamide Derivatives Hives  . Band-Aid Liquid Bandage [New Skin] Other (See Comments)    Regular band-aid cause itching and redness  . Collodion Dermatitis  . Gabapentin Other (See Comments)    Mental status changes  . Latex Other (See Comments) and Dermatitis    "redness"  . Sulfa Antibiotics Hives  . Oxycontin [Oxycodone Hcl] Rash         Medications:  I have reviewed the patient's current medications. Prior to Admission:  Medications Prior to Admission  Medication Sig Dispense Refill Last Dose  . albuterol (PROVENTIL) (2.5 MG/3ML) 0.083% nebulizer solution INHALE CONTENTS OF ONE VIAL VIA NEBULIZER EVERY 4 HOURS AS NEEDED (Patient taking differently: Take 2.5 mg by nebulization 2 (two) times daily as needed for wheezing. ) 300 mL 0 unk at prn  . Biotin 5000 MCG CAPS Take 5,000 mcg by mouth daily.    09/26/2018 at Unknown time  . buPROPion (WELLBUTRIN XL) 300 MG 24 hr tablet Take 1 tablet (300 mg total) by mouth every morning. Must keep upcoming appt to get refills 90 tablet 0 09/26/2018 at Unknown time  . Cholecalciferol 2000 units TABS Take 2,000 Units by mouth daily.    09/26/2018 at Unknown time  . estrogen-methylTESTOSTERone 0.625-1.25 MG tablet Take 1 tablet by mouth daily.   09/26/2018 at Unknown time  . feeding supplement, ENSURE ENLIVE, (ENSURE ENLIVE) LIQD Take 237 mLs by mouth 2 (two) times daily between meals. (Patient taking differently: Take 237 mLs by mouth 3 (three) times a week. ) 237 mL 12  unk  . fexofenadine (ALLEGRA) 180 MG tablet Take 180 mg by mouth daily.   09/26/2018 at Unknown time  . finasteride (PROSCAR) 5 MG tablet Take 2.5 mg by mouth daily.    09/26/2018 at Unknown time  . Flaxseed, Linseed, (FLAXSEED OIL) 1000 MG CAPS Take 1,000 mg by mouth 2 (two) times daily.    09/26/2018 at Unknown time  . fluticasone (FLONASE) 50 MCG/ACT nasal spray USE 1 SPRAY IN EACH NOSTRIL TWO TIMES A DAY AS NEEDED (Patient taking differently: Place 1 spray into both nostrils 2 (two) times daily as  needed for allergies. ) 48 g 3 09/26/2018 at Unknown time  . fluticasone furoate-vilanterol (BREO ELLIPTA) 100-25 MCG/INH AEPB USE 1 INHALATION DAILY (Patient taking differently: Inhale 1 puff into the lungs daily. ) 180 each 3 09/26/2018 at Unknown time  . guaiFENesin (MUCINEX) 600 MG 12 hr tablet Take 1 tablet (600 mg total) by mouth 2 (two) times daily. 60 tablet 0 09/26/2018 at Unknown time  . ibuprofen (ADVIL,MOTRIN) 200 MG tablet Take 400 mg by mouth every 6 (six) hours as needed for headache or moderate pain.   Past Week at Unknown time  . Ipratropium-Albuterol (COMBIVENT RESPIMAT) 20-100 MCG/ACT AERS respimat Inhale 1 puff into the lungs 4 (four) times daily as needed for wheezing. 3 Inhaler 3 unk  . ipratropium-albuterol (DUONEB) 0.5-2.5 (3) MG/3ML SOLN INHALE CONTENTS OF 1 VIAL INTO THE LUNGS VIA NEBULIZER EVERY 6 (SIX) HOURS AS NEEDED (Patient taking differently: Take 3 mLs by nebulization every 6 (six) hours as needed (wheezing). ) 360 mL 2 09/26/2018 at Unknown time  . LORazepam (ATIVAN) 0.5 MG tablet Take 1-2 tablets (0.5-1 mg total) by mouth at bedtime as needed for anxiety. Patient needs office visit before refills will be given (Patient taking differently: Take 0.5 mg by mouth at bedtime as needed for anxiety. Patient needs office visit before refills will be given) 60 tablet 2 Past Week at Unknown time  . metoprolol tartrate (LOPRESSOR) 25 MG tablet Take 0.5 tablets (12.5 mg total) by mouth  daily. (Patient taking differently: Take 12.5 mg by mouth 2 (two) times daily. ) 45 tablet 3 09/26/2018 at 0715  . minoxidil (ROGAINE) 2 % external solution Apply 1 application topically as needed (for hair loss).    Past Week at Unknown time  . Multiple Vitamins-Minerals (ONE-A-DAY 50 PLUS PO) Take 1 tablet by mouth daily.    09/26/2018 at Unknown time  . Respiratory Therapy Supplies (FLUTTER) DEVI Use a directed. 1 each 0 unk  . simvastatin (ZOCOR) 20 MG tablet TAKE 1 TABLET BY MOUTH  DAILY AT 6 PM. (Patient taking differently: Take 20 mg by mouth at bedtime. ) 90 tablet 3 09/25/2018 at Unknown time   Scheduled: . aspirin  324 mg Oral NOW   Or  . aspirin  300 mg Rectal NOW  . [START ON 09/28/2018] aspirin  81 mg Oral Pre-Cath  . aspirin EC  81 mg Oral Daily  . atorvastatin  80 mg Oral q1800  . buPROPion  300 mg Oral q morning - 10a  . cholecalciferol  2,000 Units Oral Daily  . estrogen-methylTESTOSTERone  1 tablet Oral Daily  . finasteride  2.5 mg Oral Daily  . fluticasone furoate-vilanterol  1 puff Inhalation Daily  . loratadine  10 mg Oral Daily  . losartan  25 mg Oral Daily  . metoprolol tartrate  12.5 mg Oral BID  . sodium chloride flush  3 mL Intravenous Q12H  . sodium chloride flush  3 mL Intravenous Q12H   Continuous: . sodium chloride    . sodium chloride 10 mL/hr at 09/27/18 0700  . [START ON 09/28/2018] sodium chloride     Followed by  . [START ON 09/28/2018] sodium chloride    . heparin 900 Units/hr (09/27/18 0840)  . nitroGLYCERIN Stopped (09/26/18 2048)   DVV:OHYWVP chloride, sodium chloride, acetaminophen, ipratropium-albuterol, LORazepam, morphine injection, nitroGLYCERIN, ondansetron (ZOFRAN) IV, sodium chloride flush, sodium chloride flush Anti-infectives (From admission, onward)   None      Results for orders placed or performed during the hospital encounter  of 09/26/18 (from the past 48 hour(s))  CBC with Differential     Status: Abnormal   Collection  Time: 09/26/18  3:30 PM  Result Value Ref Range   WBC 8.7 4.0 - 10.5 K/uL   RBC 4.79 3.87 - 5.11 MIL/uL   Hemoglobin 15.4 (H) 12.0 - 15.0 g/dL   HCT 46.2 (H) 36.0 - 46.0 %   MCV 96.5 80.0 - 100.0 fL   MCH 32.2 26.0 - 34.0 pg   MCHC 33.3 30.0 - 36.0 g/dL   RDW 12.1 11.5 - 15.5 %   Platelets 266 150 - 400 K/uL   nRBC 0.0 0.0 - 0.2 %   Neutrophils Relative % 66 %   Neutro Abs 5.8 1.7 - 7.7 K/uL   Lymphocytes Relative 23 %   Lymphs Abs 2.0 0.7 - 4.0 K/uL   Monocytes Relative 6 %   Monocytes Absolute 0.5 0.1 - 1.0 K/uL   Eosinophils Relative 4 %   Eosinophils Absolute 0.4 0.0 - 0.5 K/uL   Basophils Relative 0 %   Basophils Absolute 0.0 0.0 - 0.1 K/uL   Immature Granulocytes 1 %   Abs Immature Granulocytes 0.05 0.00 - 0.07 K/uL    Comment: Performed at [facility], [address], [place], [place] [address]  Comprehensive metabolic panel     Status: Abnormal   Collection Time: 09/26/18  3:30 PM  Result Value Ref Range   Sodium 133 (L) 135 - 145 mmol/L   Potassium 3.7 3.5 - 5.1 mmol/L   Chloride 100 98 - 111 mmol/L   CO2 26 22 - 32 mmol/L   Glucose, Bld 90 70 - 99 mg/dL   BUN 7 (L) 8 - 23 mg/dL   Creatinine, Ser 0.81 0.44 - 1.00 mg/dL   Calcium 9.1 8.9 - 10.3 mg/dL   Total Protein 7.0 6.5 - 8.1 g/dL   Albumin 4.0 3.5 - 5.0 g/dL   AST 46 (H) 15 - 41 U/L   ALT 40 0 - 44 U/L   Alkaline Phosphatase 108 38 - 126 U/L   Total Bilirubin 0.4 0.3 - 1.2 mg/dL   GFR calc non Af Amer >60 >60 mL/min   GFR calc Af Amer >60 >60 mL/min    Comment: (NOTE) The eGFR has been calculated using the CKD EPI equation. This calculation has not been validated in all clinical situations. eGFR's persistently <60 mL/min signify possible Chronic Kidney Disease.    Anion gap 7 5 - 15    Comment: Performed at [facility] [address] [place], [place] [address]  D-dimer, quantitative (not at [facility])     Status: Abnormal   Collection Time: 09/26/18  3:30 PM  Result Value Ref Range    D-Dimer, Quant 0.86 (H) 0.00 - 0.50 ug/mL-FEU    Comment: (NOTE) At the manufacturer cut-off of 0.50 ug/mL FEU, this assay has been documented to exclude PE with a sensitivity and negative predictive value of 97 to 99%.  At this time, this assay has not been approved by the FDA to exclude DVT/VTE. Results should be correlated with clinical presentation. Performed at [facility], [place] [address], [place], [place] [address]   I-stat troponin, ED     Status: Abnormal   Collection Time: 09/26/18  3:39 PM  Result Value Ref Range   Troponin i, poc 0.26 (HH) 0.00 - 0.08 ng/mL   Comment NOTIFIED PHYSICIAN    Comment 3  Comment: Due to the release kinetics of cTnI, a negative result within the first hours of the onset of symptoms does not rule out myocardial infarction with certainty. If myocardial infarction is still suspected, repeat the test at appropriate intervals.   MRSA PCR Screening     Status: None   Collection Time: 09/26/18  6:37 PM  Result Value Ref Range   MRSA by PCR NEGATIVE NEGATIVE    Comment:        The GeneXpert MRSA Assay (FDA approved for NASAL specimens only), is one component of a comprehensive MRSA colonization surveillance program. It is not intended to diagnose MRSA infection nor to guide or monitor treatment for MRSA infections. Performed at [facility], [place] [address], [place], [place] [address]   HIV antibody (Routine Testing)     Status: None   Collection Time: 09/26/18  7:39 PM  Result Value Ref Range   HIV Screen 4th Generation wRfx Non Reactive Non Reactive    Comment: (NOTE) Performed At: [facility] [place], [place] [id number] Rush Farmer MD [id number]   Troponin I     Status: Abnormal   Collection Time: 09/26/18  7:39 PM  Result Value Ref Range   Troponin I 6.55 (HH) <0.03 ng/mL    Comment: CRITICAL RESULT CALLED TO, READ BACK BY AND VERIFIED WITH: Nicole Kindred [facility] 09/26/18 2129  WAYK Performed at [facility], [address] [place], [place] [address]   Troponin I     Status: Abnormal   Collection Time: 09/27/18 12:58 AM  Result Value Ref Range   Troponin I 9.80 (HH) <0.03 ng/mL    Comment: CRITICAL VALUE NOTED.  VALUE IS CONSISTENT WITH PREVIOUSLY REPORTED AND CALLED VALUE. Performed at [facility], [place] [address] [place], [place] [address]   Lipid panel     Status: Abnormal   Collection Time: 09/27/18 12:58 AM  Result Value Ref Range   Cholesterol 120 0 - 200 mg/dL   Triglycerides 68 <150 mg/dL   HDL 36 (L) >40 mg/dL   Total CHOL/HDL Ratio 3.3 RATIO   VLDL 14 0 - 40 mg/dL   LDL Cholesterol 70 0 - 99 mg/dL    Comment:        Total Cholesterol/HDL:CHD Risk Coronary Heart Disease Risk Table                     Men   Women  1/2 Average Risk   3.4   3.3  Average Risk       5.0   4.4  2 X Average Risk   9.6   7.1  3 X Average Risk  23.4   11.0        Use the calculated Patient Ratio above and the CHD Risk Table to determine the patient's CHD Risk.        ATP III CLASSIFICATION (LDL):  <100     mg/dL   Optimal  100-129  mg/dL   Near or Above                    Optimal  130-159  mg/dL   Borderline  160-189  mg/dL   High  >190     mg/dL   Very High Performed at [facility] [address], [place], [place] [address]   CBC     Status: None   Collection Time: 09/27/18  6:38 AM  Result Value Ref Range  WBC 9.6 4.0 - 10.5 K/uL   RBC 4.36 3.87 - 5.11 MIL/uL   Hemoglobin 13.8 12.0 - 15.0 g/dL   HCT 42.1 36.0 - 46.0 %   MCV 96.6 80.0 - 100.0 fL   MCH 31.7 26.0 - 34.0 pg   MCHC 32.8 30.0 - 36.0 g/dL   RDW 12.2 11.5 - 15.5 %   Platelets 214 150 - 400 K/uL   nRBC 0.0 0.0 - 0.2 %    Comment: Performed at [facility], [place] [address], [place], [place] [address]  Troponin I     Status: Abnormal   Collection Time: 09/27/18  6:38 AM  Result Value Ref Range   Troponin I 4.25 (HH) <0.03 ng/mL    Comment: CRITICAL VALUE NOTED.   VALUE IS CONSISTENT WITH PREVIOUSLY REPORTED AND CALLED VALUE. Performed at [facility], [place] [address] [place], [place] [address]   Basic metabolic panel     Status: Abnormal   Collection Time: 09/27/18  6:38 AM  Result Value Ref Range   Sodium 133 (L) 135 - 145 mmol/L   Potassium 3.9 3.5 - 5.1 mmol/L   Chloride 102 98 - 111 mmol/L   CO2 19 (L) 22 - 32 mmol/L   Glucose, Bld 81 70 - 99 mg/dL   BUN 6 (L) 8 - 23 mg/dL   Creatinine, Ser 0.74 0.44 - 1.00 mg/dL   Calcium 8.8 (L) 8.9 - 10.3 mg/dL   GFR calc non Af Amer >60 >60 mL/min   GFR calc Af Amer >60 >60 mL/min    Comment: (NOTE) The eGFR has been calculated using the CKD EPI equation. This calculation has not been validated in all clinical situations. eGFR's persistently <60 mL/min signify possible Chronic Kidney Disease.    Anion gap 12 5 - 15    Comment: Performed at [facility] [address], [place], [place] [address]  Heparin level (unfractionated)     Status: Abnormal   Collection Time: 09/27/18  6:38 AM  Result Value Ref Range   Heparin Unfractionated 0.16 (L) 0.30 - 0.70 IU/mL    Comment: (NOTE) If heparin results are below expected values, and patient dosage has  been confirmed, suggest follow up testing of antithrombin III levels. Performed at [facility], [place] [address], [place], [place] [address]     Dg Chest 2 View  Result Date: 09/26/2018 CLINICAL DATA:  Chest pain and cough EXAM: CHEST - 2 VIEW COMPARISON:  Chest CT February 14, 2018; chest radiograph March 04, 2018 FINDINGS: There is stable fibrotic change throughout the lungs bilaterally, most notably in the base regions. There is no frank edema or consolidation. Heart size and pulmonary vascularity are normal. No adenopathy. There is aortic atherosclerosis. No bone lesions. No pneumothorax. IMPRESSION: Stable fibrotic changes throughout the lungs, most notably in the lower lobe regions. No edema or consolidation. Stable cardiac silhouette.  No evident adenopathy. Electronically Signed   By: Lowella Grip III M.D.   On: 09/26/2018 15:19    Review of Systems  Constitutional: Positive for diaphoresis and malaise/fatigue. Negative for chills, fever and weight loss.  HENT: Negative.   Eyes: Negative.   Respiratory: Positive for cough, sputum production, shortness of breath and wheezing. Negative for hemoptysis.   Cardiovascular: Positive for chest pain. Negative for orthopnea, leg swelling and PND.  Gastrointestinal: Negative.   Genitourinary: Negative.   Musculoskeletal: Negative.   Skin: Negative.   Neurological: Positive for headaches.  Endo/Heme/Allergies: Negative.  Psychiatric/Behavioral: Negative.    Blood pressure 121/70, pulse 84, temperature 97.6 F (36.4 C), temperature source Oral, resp. rate (!) 28, weight 64.2 kg, SpO2 98 %. Physical Exam  Constitutional: She is oriented to person, place, and time. She appears well-developed and well-nourished. No distress.  HENT:  Head: Normocephalic and atraumatic.  Mouth/Throat: Oropharynx is clear and moist.  Eyes: Pupils are equal, round, and reactive to light. EOM are normal.  Neck: Normal range of motion. Neck supple. No JVD present. No thyromegaly present.  Cardiovascular: Normal rate, regular rhythm, normal heart sounds and intact distal pulses.  No murmur heard. Respiratory: Effort normal. No respiratory distress.  Dry rales in bases  GI: Soft. Bowel sounds are normal. She exhibits no distension and no mass. There is no tenderness.  Musculoskeletal: Normal range of motion. She exhibits no edema.  Lymphadenopathy:    She has no cervical adenopathy.  Neurological: She is alert and oriented to person, place, and time.  Skin: Skin is warm and dry.  Psychiatric: She has a normal mood and affect.   Physicians   Panel Physicians Referring Physician Case Authorizing Physician  End, Harrell Gave, MD (Primary)    Procedures   LEFT HEART CATH AND CORONARY  ANGIOGRAPHY  Conclusion   Conclusions: 1. Diffuse, heavy calcification of the coronary arteries with significant 2-vessel CAD.  There is an 80% ostial/proximal LAD stenosis as well as an eccentric 70% mid RCA lesion. 2. Normal left ventricular systolic function and filling pressure.  Recommendations: 1. Images reviewed with Dr. Ellyn Hack.  We both agree that given heavy calcification and ostial disease of the LAD would be best suited for bypass.  PCI would be high risk, requiring atherectomy and stenting back into the distal LMCA.  Cardiac surgery has been contacted for consultation. 2. Restart heparin infusion 2 hours after TR band removal. 3. Metoprolol and IV nitroglycerin for antianginal therapy. 4. Aggressive secondary prevention. 5. Obtain transthoracic echocardiogram.  Recommend uninterrupted dual antiplatelet therapy with Aspirin 66m daily and Clopidogrel 739mdaily for a minimum of 12 months (ACS - Class I recommendation).  Addition of clopidogrel has been deferred pending cardiac surgery consultation.  ChNelva BushMD [facility] Pager: [phone] Indications   Non-ST elevation (NSTEMI) myocardial infarction (HCGackle[I21.4 (ICD-10-CM)]  Procedural Details/Technique   Technical Details Indication: [age].o. year-old woman with history of hypertension, hyperlipidemia, pulmonary fibrosis, and strong family history of premature coronary artery disease, admitted with intermittent chest pain that began at rest this morning. Initial troponin in the ED was mildly positive. Given continued chest pain, she has been referred for urgent cardiac catheterization and possible PCI.  GFR: >60 ml/min  Procedure: The risks, benefits, complications, treatment options, and expected outcomes were discussed with the patient. The patient and/or family concurred with the proposed plan, giving informed consent. The patient was brought to the cath lab after IV hydration was begun and oral  premedication was given. The patient was further sedated with Versed and Fentanyl. The right wrist was assessed with a modified Allens test which was normal. The right wrist was prepped and draped in a sterile fashion. 1% lidocaine was used for local anesthesia. Using the modified Seldinger access technique, a 50F slender Glidesheath was placed in the right radial artery. 3 mg Verapamil was given through the sheath. Heparin 3,500 units were administered.  Selective coronary angiography was performed using 85F JL3.5 and JR4 catheters to engage the left and right coronary arteries, respectively. Left heart catheterization was performed using  a 51F JR4 catheter. Left ventriculogram was performed with a hand injection of contrast.  At the end of the procedure, the radial artery sheath was removed and a TR band applied to achieve patent hemostasis. There were no immediate complications. The patient was taken to the recovery area in stable condition.  Contrast used: 40 mL Isovue Fluoroscopy time: 2 min Radiation dose: 570 mGy   Estimated blood loss <50 mL.  During this procedure the patient was administered the following to achieve and maintain moderate conscious sedation: Versed 1 mg, Fentanyl 50 mcg, while the patient's heart rate, blood pressure, and oxygen saturation were continuously monitored. The period of conscious sedation was 26 minutes, of which I was present face-to-face 100% of this time.  Complications   Complications documented before study signed (09/26/2018 6:42 PM EDT)    No complications were associated with this study.  Documented by Nelva Bush, MD - 09/26/2018 6:36 PM EDT    Coronary Findings   Diagnostic  Dominance: Right  Left Main  Vessel is large. Vessel is angiographically normal.  Dist LM lesion 30% stenosed  Dist LM lesion is 30% stenosed. The lesion is severely calcified.  Left Anterior Descending  Ost LAD to Prox LAD lesion 80% stenosed  Ost LAD to Prox LAD  lesion is 80% stenosed. The lesion is severely calcified.  Mid LAD lesion 60% stenosed  Mid LAD lesion is 60% stenosed.  First Diagonal Branch  Vessel is small in size.  Left Circumflex  Vessel is large. There is mild diffuse disease throughout the vessel. The vessel is severely calcified.  First Obtuse Marginal Branch  Vessel is large in size.  Second Obtuse Marginal Branch  Vessel is small in size.  Right Coronary Artery  Vessel is large. There is moderate diffuse disease throughout the vessel. The vessel is severely calcified.  Mid RCA lesion 70% stenosed  Mid RCA lesion is 70% stenosed. The lesion is eccentric. The lesion is calcified.  Right Posterior Descending Artery  Vessel is moderate in size.  Right Posterior Atrioventricular Branch  Vessel is moderate in size.  Intervention   No interventions have been documented.  Wall Motion   Resting       All segments of the heart are normal.          Left Heart   Left Ventricle The left ventricular size is normal. The left ventricular systolic function is normal. LV end diastolic pressure is normal. The left ventricular ejection fraction is 55-65% by visual estimate. No regional wall motion abnormalities.  Aortic Valve There is no aortic valve stenosis.  Coronary Diagrams   Diagnostic Diagram       Implants    No implant documentation for this case.  MERGE Images   Show images for CARDIAC CATHETERIZATION   Link to Procedure Log   Procedure Log    Hemo Data    Most Recent Value  AO Systolic Pressure 294 mmHg  AO Diastolic Pressure 66 mmHg  AO Mean 96 mmHg  LV Systolic Pressure 765 mmHg  LV Diastolic Pressure 1 mmHg  LV EDP 6 mmHg  AOp Systolic Pressure 465 mmHg  AOp Diastolic Pressure 67 mmHg  AOp Mean Pressure 92 mmHg  LVp Systolic Pressure 035 mmHg  LVp Diastolic Pressure 4 mmHg  LVp EDP Pressure 13 mmHg    Ref Range & Units 10:23  FVC-Pre L 2.10 P  FVC-%Pred-Pre % 60 P  FVC-Post L 1.98 P   FVC-%Pred-Post % 56 P  FVC-%Change-Post % -  6 P  FEV1-Pre L 1.92 P  FEV1-%Pred-Pre % 71 P  FEV1-Post L 1.80 P  FEV1-%Pred-Post % 66 P  FEV1-%Change-Post % -6 P  FEV6-Pre L 2.10 P  FEV6-%Pred-Pre % 62 P  FEV6-Post L 1.98 P  FEV6-%Pred-Post % 58 P  FEV6-%Change-Post % -6 P  Pre FEV1/FVC ratio % 91 P  FEV1FVC-%Pred-Pre % 117 P  Post FEV1/FVC ratio % 91 P  FEV1FVC-%Change-Post % 0 P  Pre FEV6/FVC Ratio % 100 P  FEV6FVC-%Pred-Pre % 103 P  Post FEV6/FVC ratio % 100 P  FEV6FVC-%Pred-Post % 103 P  FEF 25-75 Pre L/sec 3.92 P  FEF2575-%Pred-Pre % 164 P  FEF 25-75 Post L/sec 3.43 P  FEF2575-%Pred-Post % 144 P  FEF2575-%Change-Post % -12 P  Resulting Agency  [facility]      Specimen Collected: 09/27/18 10:23 Last Resulted: 09/27/18 11:35      Assessment/Plan:   This 62 year old woman has severe two-vessel coronary disease presented with acute non-ST segment elevation MI.  Her ostial/proximal LAD stenosis is heavily calcified and is not felt to be suitable for PCI.  I agree that her best long-term treatment is going to be coronary bypass graft surgery to the LAD and right coronary artery using a left internal mammary graft to the LAD. I discussed the operative procedure with the patient and her husband including alternatives, benefits and risks; including but not limited to bleeding, blood transfusion, infection, stroke, myocardial infarction, graft failure, heart block requiring a permanent pacemaker, organ dysfunction, and death.  Lessie Dings Willison understands and agrees to proceed.  I told them that I would not be able to do surgery but Dr. Servando Snare can do it Monday. She is in agreement. We will schedule surgery for Monday morning with Dr. Servando Snare.   I spent 45 minutes performing this consultation and > 50% of this time was spent face to face counseling and coordinating the care of this patient's severe 2-vessel coronary artery disease.  Gaye Pollack 09/27/2018, 2:45 PM

## 2018-09-27 NOTE — Progress Notes (Addendum)
Progress Note  Patient Name: Sarah Escobar Date of Encounter: 09/27/2018  Primary Cardiologist: new patient/ Dr Saunders Revel Dr Meda Coffee  Subjective   No chest pain overnight.  Inpatient Medications    Scheduled Meds: . aspirin  324 mg Oral NOW   Or  . aspirin  300 mg Rectal NOW  . aspirin EC  81 mg Oral Daily  . atorvastatin  80 mg Oral q1800  . buPROPion  300 mg Oral q morning - 10a  . cholecalciferol  2,000 Units Oral Daily  . estrogen-methylTESTOSTERone  1 tablet Oral Daily  . finasteride  2.5 mg Oral Daily  . fluticasone furoate-vilanterol  1 puff Inhalation Daily  . loratadine  10 mg Oral Daily  . metoprolol tartrate  12.5 mg Oral BID  . sodium chloride flush  3 mL Intravenous Q12H   Continuous Infusions: . sodium chloride 10 mL/hr at 09/27/18 0700  . heparin 900 Units/hr (09/27/18 0840)  . nitroGLYCERIN Stopped (09/26/18 2048)   PRN Meds: sodium chloride, acetaminophen, ipratropium-albuterol, LORazepam, morphine injection, nitroGLYCERIN, ondansetron (ZOFRAN) IV, sodium chloride flush   Vital Signs    Vitals:   09/27/18 0700 09/27/18 0800 09/27/18 0819 09/27/18 0830  BP: 117/68 (!) 136/91    Pulse: 71 84    Resp: 13 14    Temp:   (!) 97.4 F (36.3 C)   TempSrc:   Oral   SpO2: 94% 99%  96%  Weight:        Intake/Output Summary (Last 24 hours) at 09/27/2018 4128 Last data filed at 09/27/2018 0700 Gross per 24 hour  Intake 472.08 ml  Output 350 ml  Net 122.08 ml   Filed Weights   09/26/18 1600  Weight: 64.2 kg   Telemetry    SR, 1 run of nsVT - 6 beats - Personally Reviewed  ECG    Pending today - Personally Reviewed  Physical Exam   GEN: No acute distress.   Neck: No JVD Cardiac: RRR, no murmurs, rubs, or gallops.  Respiratory: Clear to auscultation bilaterally. GI: Soft, nontender, non-distended  MS: No edema; No deformity. Neuro:  Nonfocal  Psych: Normal affect   Labs    Chemistry Recent Labs  Lab 09/26/18 1530 09/27/18 0638  NA  133* 133*  K 3.7 3.9  CL 100 102  CO2 26 19*  GLUCOSE 90 81  BUN 7* 6*  CREATININE 0.81 0.74  CALCIUM 9.1 8.8*  PROT 7.0  --   ALBUMIN 4.0  --   AST 46*  --   ALT 40  --   ALKPHOS 108  --   BILITOT 0.4  --   GFRNONAA >60 >60  GFRAA >60 >60  ANIONGAP 7 12    Hematology Recent Labs  Lab 09/26/18 1530 09/27/18 0638  WBC 8.7 9.6  RBC 4.79 4.36  HGB 15.4* 13.8  HCT 46.2* 42.1  MCV 96.5 96.6  MCH 32.2 31.7  MCHC 33.3 32.8  RDW 12.1 12.2  PLT 266 214   Cardiac Enzymes Recent Labs  Lab 09/26/18 1939 09/27/18 0058 09/27/18 0638  TROPONINI 6.55* 9.80* 4.25*    Recent Labs  Lab 09/26/18 1539  TROPIPOC 0.26*    BNPNo results for input(s): BNP, PROBNP in the last 168 hours.   DDimer  Recent Labs  Lab 09/26/18 1530  DDIMER 0.86*    Radiology    Dg Chest 2 View  Result Date: 09/26/2018 CLINICAL DATA:  Chest pain and cough EXAM: CHEST - 2 VIEW COMPARISON:  Chest  CT February 14, 2018; chest radiograph March 04, 2018 FINDINGS: There is stable fibrotic change throughout the lungs bilaterally, most notably in the base regions. There is no frank edema or consolidation. Heart size and pulmonary vascularity are normal. No adenopathy. There is aortic atherosclerosis. No bone lesions. No pneumothorax. IMPRESSION: Stable fibrotic changes throughout the lungs, most notably in the lower lobe regions. No edema or consolidation. Stable cardiac silhouette. No evident adenopathy. Electronically Signed   By: Lowella Grip III M.D.   On: 09/26/2018 15:19   Cardiac Studies   LHC: 09/26/2018 1. Diffuse, heavy calcification of the coronary arteries with significant 2-vessel CAD.  There is an 80% ostial/proximal LAD stenosis as well as an eccentric 70% mid RCA lesion. 2. Normal left ventricular systolic function and filling pressure.    Patient Profile     62 y.o. female  Sarah Escobar    1. NSTEMI - 0.26 -> 6.5->9.8->4.2 - cath showed severe 2 VD including 80% ostial LAD  and 70% mid RCA stenosis, CT surgery was consulted for CABG - Dr Cyndia Bent to see, anticipated surgery date - Monday 10/28,  - I will continue heparin for now  2. HLD  - LDL was 60 on 12/2017. Recheck tomorrow. Started on high dose atorvastatin.    3. HTN - Elevated. Add losartan 25 mg po daily.   4. Pulmonary fibrosis - No active issue. Followed by Dr. Elsworth Soho.  For questions or updates, please contact [place] Please consult [email or web address] for contact info under     Signed, Ena Dawley, MD  09/27/2018, 9:03 AM

## 2018-09-28 ENCOUNTER — Other Ambulatory Visit (HOSPITAL_COMMUNITY): Payer: 59

## 2018-09-28 ENCOUNTER — Inpatient Hospital Stay (HOSPITAL_COMMUNITY): Payer: 59

## 2018-09-28 DIAGNOSIS — Z01811 Encounter for preprocedural respiratory examination: Secondary | ICD-10-CM

## 2018-09-28 LAB — URINALYSIS, ROUTINE W REFLEX MICROSCOPIC
Bilirubin Urine: NEGATIVE
Glucose, UA: NEGATIVE mg/dL
Hgb urine dipstick: NEGATIVE
Ketones, ur: NEGATIVE mg/dL
Leukocytes, UA: NEGATIVE
Nitrite: NEGATIVE
Protein, ur: NEGATIVE mg/dL
Specific Gravity, Urine: 1.004 — ABNORMAL LOW (ref 1.005–1.030)
pH: 7 (ref 5.0–8.0)

## 2018-09-28 LAB — CBC
HEMATOCRIT: 42 % (ref 36.0–46.0)
HEMOGLOBIN: 13.7 g/dL (ref 12.0–15.0)
MCH: 31.2 pg (ref 26.0–34.0)
MCHC: 32.6 g/dL (ref 30.0–36.0)
MCV: 95.7 fL (ref 80.0–100.0)
Platelets: 234 10*3/uL (ref 150–400)
RBC: 4.39 MIL/uL (ref 3.87–5.11)
RDW: 12.3 % (ref 11.5–15.5)
WBC: 8.8 10*3/uL (ref 4.0–10.5)
nRBC: 0 % (ref 0.0–0.2)

## 2018-09-28 LAB — GLUCOSE, CAPILLARY
Glucose-Capillary: 63 mg/dL — ABNORMAL LOW (ref 70–99)
Glucose-Capillary: 105 mg/dL — ABNORMAL HIGH (ref 70–99)

## 2018-09-28 LAB — HEPARIN LEVEL (UNFRACTIONATED): Heparin Unfractionated: 0.47 IU/mL (ref 0.30–0.70)

## 2018-09-28 LAB — SURGICAL PCR SCREEN
MRSA, PCR: NEGATIVE
Staphylococcus aureus: NEGATIVE

## 2018-09-28 LAB — SEDIMENTATION RATE: Sed Rate: 15 mm/hr (ref 0–22)

## 2018-09-28 MED ORDER — FLUTICASONE PROPIONATE 50 MCG/ACT NA SUSP
2.0000 | Freq: Two times a day (BID) | NASAL | Status: DC
  Administered 2018-09-28 – 2018-10-23 (×48): 2 via NASAL
  Filled 2018-09-28 (×2): qty 16

## 2018-09-28 MED ORDER — SALINE SPRAY 0.65 % NA SOLN
2.0000 | Freq: Two times a day (BID) | NASAL | Status: DC
  Administered 2018-09-28 – 2018-10-16 (×34): 2 via NASAL
  Filled 2018-09-28: qty 44

## 2018-09-28 NOTE — Consult Note (Signed)
NAME:  Sarah Escobar, MRN:  [id number], DOB:  09-13-1956, LOS: 2 ADMISSION DATE:  09/26/2018, CONSULTATION DATE:  09/28/18 REFERRING MD:  Dr Nils Pyle, CHIEF COMPLAINT:  Pre CABG pulm  Evaluation f   HPI   Sarah Escobar -is being seen for preoperative pulmonary evaluation prior to bypass surgery.  She is known to have interstitial lung disease followed by Dr. Elsworth Soho.  She has a known UIP pattern.  She now presented with chest pain and has been set up for a bypass on Monday, September 22, 2018.  Dr. Darcey Nora has requested preoperative pulmonary clearance.  In terms of interstitial lung disease: She reports that she has been following in the pulmonary clinic for a few years.  In 2015 she had hypersensitivity pneumonitis panel and it was normal.  She had ANA and rheumatoid factor antibodies in 2015 and these were normal.  She reports that up until 6 months ago she was exercising on her stationary bike for 5 miles without any problems.  But then she stopped exercising because she was developing tiredness.he had a high-resolution CT scan of the chest March 2019 that I personally visualized.  The official report is that she has air-trapping slight upper lobe predominant reticulation.  Significant progression compared to March 2016,  3 years earlier.  There is some honeycombing in the left upper lobe.  Features were thought to be consistent with hypersensitivity pneumonitis chronic.  Then on March 04, 2018 she underwent bronchoscopy with lavage that showed polymorphs of 94% and a transbronchial biopsy that showed nonspecific inflammation but tissue was felt to be insufficient..    She has been on observation therapy.  She is not on chronic prednisone.  She has not been on any oxygen.  Never had any chest pain until this admission.Marland Kitchen  However she stopped exercising because she was having this tiredness 6 months ago and. did not notice any shortness of breath with activities of daily living except 3 weeks ago when she  walked 1 mile in [place] and she had to stop 3 times which is more than her husband.  She had never had this problem a year earlier.  Pulmonary function test done yesterday September 27, 2018 shows a 17% decline in [facility] compared to 4-1/2 years earlier which is compatible with a CT chest showing progression.  She is aware of a progressive interstitial lung disease.  Interstitial lung disease related questions: This was elicited without a formal questionnaire.  She denies any obvious mold or mildew exposure in the house.  She does not use any water fountains, CPAP, humidifier.  Denies any mildew exposure in the house.  Denies working in a [employer].  She works out of home.  Does not do any gardening.  No exposure to rotten wood or mulch.  No usage of feather pillows or blankets.  No pet hamsters or gerbils in the house.  No birds in the house.  She has never been exposed to amiodarone or chemotherapy or radiation therapy.  5 or 6 years ago she did take nitrofurantoin for a few weeks only.  Her grandmother died of some lung disease not otherwise specified.  She is a previous smoker 30 pack.  Quit 16 years ago.  In terms of connective tissue disease and collagen vascular disease: She denies this.  But on closer questioning she says that in the winter season her fingers might turn blue.  In terms of etiologic work-up May 2015 ANA and rheumatoid factor negative.  February 2017 ANA negative.  Anti-Jo 1 in May 2019- negative.  Blood allergy panel in August 2016- including IgE.  Other issue: She has chronic sinusitis.  2 weeks ago she did get antibiotics for an acute flareup.  She also took a few days of prednisone.   Hospital course: Since admission has not had any chest pain.  She is worried about pulmonary complications following bypass on account of interstitial lung disease.  Results for Sarah Escobar (MRN [id number]) as of 09/28/2018 12:18  Ref. Range 04/07/2014 12:57 09/27/2018 10:23  FVC-Pre Latest  Units: L 2.54 2.10 (17% decline in 4.5 years)  FVC-%Pred-Pre Latest Units: % 75 60  FEV1-Pre Latest Units: L 2.27 1.92  FEV1-%Pred-Pre Latest Units: % 86 71  Pre FEV1/FVC ratio Latest Units: % 89 91  Results for Sarah Escobar (MRN [id number]) as of 09/28/2018 12:18  Ref. Range 04/07/2014 12:57  DLCO unc Latest Units: ml/min/mmHg 18.56  DLCO unc % pred Latest Units: % 76   Past Medical History     has a past medical history of Allergy, Asthma, Depression, Hyperlipidemia, Hypertension, Osteopenia, Personal history of adenomatous colonic polyps/FHx colon cancer sister and father, and Sciatica of left side.   reports that she quit smoking about 16 years ago. Her smoking use included cigarettes. She has a 30.00 pack-year smoking history. She has never used smokeless tobacco.  Past Surgical History:  Procedure Laterality Date  . BUNIONECTOMY    . CERVICAL FUSION  1999   Dr Arnoldo Morale, [place]  . COLONOSCOPY  2013  . COLONOSCOPY W/ POLYPECTOMY  multiple   adenomas; [name]; last 2013  . discetomy lumbar  02/19/13   L1&2; Dr Arnoldo Morale, [place]  . LEFT HEART CATH AND CORONARY ANGIOGRAPHY N/A 09/26/2018   Procedure: LEFT HEART CATH AND CORONARY ANGIOGRAPHY;  Surgeon: Nelva Bush, MD;  Location: [facility];  Service: Cardiovascular;  Laterality: N/A;  . NASAL SINUS SURGERY  [phone]   X 3  . POLYPECTOMY  1217--2013   TA+  . TOTAL ABDOMINAL HYSTERECTOMY W/ BILATERAL SALPINGOOPHORECTOMY  2003   endometriosis   . VIDEO BRONCHOSCOPY Bilateral 03/04/2018   Procedure: VIDEO BRONCHOSCOPY WITH FLUORO;  Surgeon: Rigoberto Noel, MD;  Location: [facility];  Service: Cardiopulmonary;  Laterality: Bilateral;    Allergies  Allergen Reactions  . Flagyl [Metronidazole] Other (See Comments)    Nerve pain  . Levofloxacin Rash  . Loperamide Hcl Rash  . Neomycin-Bacitracin Zn-Polymyx Rash  . Ofloxacin Rash  . Sulfonamide Derivatives Hives  . Band-Aid Liquid Bandage [New Skin] Other (See  Comments)    Regular band-aid cause itching and redness  . Collodion Dermatitis  . Gabapentin Other (See Comments)    Mental status changes  . Latex Other (See Comments) and Dermatitis    "redness"  . Sulfa Antibiotics Hives  . Oxycontin [Oxycodone Hcl] Rash         Immunization History  Administered Date(s) Administered  . Influenza Whole 09/09/2008, 09/10/2009, 09/03/2012  . Influenza,inj,Quad PF,6+ Mos 08/25/2014, 09/03/2017, 09/17/2018  . Influenza-Unspecified 09/03/2013, 09/09/2015, 09/06/2016  . Pneumococcal Conjugate-13 08/25/2014  . Pneumococcal Polysaccharide-23 01/14/2007, 10/31/2017  . Tdap 10/29/2013    Family History  Problem Relation Age of Onset  . Colon cancer Father   . Heart attack Father 32  . Diabetes Father   . Stroke Father 72  . Colon cancer Sister   . Asthma Sister   . Asthma Mother   . Colitis Mother   . Cirrhosis Mother  non alcoholic, fatty liver  . Asthma Maternal Aunt   . Heart attack Sister 87  . Cirrhosis Brother        non alcoholic  . Colon polyps Brother   . Anemia Brother         Spherocytosis, hereditrary  . Asthma Maternal Grandmother   . Kidney failure Unknown        2 bro, 1 sister ? from HTN  . Prostate cancer Brother   . Breast cancer Neg Hx      Current Facility-Administered Medications:  .  0.9 %  sodium chloride infusion, 250 mL, Intravenous, PRN, Bhagat, Bhavinkumar, PA .  0.9 %  sodium chloride infusion, 250 mL, Intravenous, PRN, End, Christopher, MD, Last Rate: 10 mL/hr at 09/28/18 0700 .  [EXPIRED] 0.9% sodium chloride infusion, 3 mL/kg/hr, Intravenous, Continuous **FOLLOWED BY** 0.9% sodium chloride infusion, 1 mL/kg/hr, Intravenous, Continuous, Bhagat, Bhavinkumar, PA .  acetaminophen (TYLENOL) tablet 650 mg, 650 mg, Oral, Q4H PRN, Bhagat, Bhavinkumar, PA, 650 mg at 09/27/18 1448 .  aspirin EC tablet 81 mg, 81 mg, Oral, Daily, Bhagat, Bhavinkumar, PA, 81 mg at 09/28/18 1037 .  atorvastatin (LIPITOR) tablet  80 mg, 80 mg, Oral, q1800, End, Christopher, MD, 80 mg at 09/27/18 2012 .  buPROPion (WELLBUTRIN XL) 24 hr tablet 300 mg, 300 mg, Oral, q morning - 10a, Bhagat, Bhavinkumar, PA, 300 mg at 09/27/18 0905 .  cholecalciferol (VITAMIN D) tablet 2,000 Units, 2,000 Units, Oral, Daily, Bhagat, Bhavinkumar, PA, 2,000 Units at 09/28/18 1036 .  finasteride (PROSCAR) tablet 2.5 mg, 2.5 mg, Oral, Daily, Bhagat, Bhavinkumar, PA, 2.5 mg at 09/28/18 1036 .  fluticasone furoate-vilanterol (BREO ELLIPTA) 100-25 MCG/INH 1 puff, 1 puff, Inhalation, Daily, Bhagat, Bhavinkumar, PA, 1 puff at 09/28/18 0844 .  heparin ADULT infusion 100 units/mL (25000 units/266m sodium chloride 0.45%), 900 Units/hr, Intravenous, Continuous, WLyndee Leo RPH, Last Rate: 9 mL/hr at 09/28/18 0700, 900 Units/hr at 09/28/18 0700 .  ipratropium-albuterol (DUONEB) 0.5-2.5 (3) MG/3ML nebulizer solution 3 mL, 3 mL, Nebulization, Q6H PRN, Bhagat, Bhavinkumar, PA .  loratadine (CLARITIN) tablet 10 mg, 10 mg, Oral, Daily, Bhagat, Bhavinkumar, PA, 10 mg at 09/28/18 1037 .  LORazepam (ATIVAN) tablet 0.5 mg, 0.5 mg, Oral, Q6H PRN, BGaye Pollack MD, 0.5 mg at 09/27/18 2011 .  metoprolol tartrate (LOPRESSOR) tablet 12.5 mg, 12.5 mg, Oral, BID, Bhagat, Bhavinkumar, PA, 12.5 mg at 09/28/18 1036 .  morphine 2 MG/ML injection 2 mg, 2 mg, Intravenous, Q4H PRN, BGaye Pollack MD, 2 mg at 09/27/18 1533 .  nitroGLYCERIN (NITROSTAT) SL tablet 0.4 mg, 0.4 mg, Sublingual, Q5 min PRN, Bhagat, Bhavinkumar, PA .  nitroGLYCERIN 50 mg in dextrose 5 % 250 mL (0.2 mg/mL) infusion, 0-200 mcg/min, Intravenous, Titrated, Bhagat, Bhavinkumar, PA, Stopped at 09/26/18 2048 .  ondansetron (ZOFRAN) injection 4 mg, 4 mg, Intravenous, Q6H PRN, Bhagat, Bhavinkumar, PA .  sodium chloride flush (NS) 0.9 % injection 3 mL, 3 mL, Intravenous, Q12H, Bhagat, Bhavinkumar, PA, 3 mL at 09/27/18 1536 .  sodium chloride flush (NS) 0.9 % injection 3 mL, 3 mL, Intravenous, PRN, Bhagat,  Bhavinkumar, PA .  sodium chloride flush (NS) 0.9 % injection 3 mL, 3 mL, Intravenous, Q12H, End, Christopher, MD, 3 mL at 09/28/18 1037 .  sodium chloride flush (NS) 0.9 % injection 3 mL, 3 mL, Intravenous, PRN, End, CHarrell Gave MD  Significant Hospital Events   09/26/2018 0 admit 09/28/18 - CCM consult pre CABG  Consults: date of consult/date signed off & final recs:  09/28/18 - ccm consult  Procedures (surgical and bedside):  x  Significant Diagnostic Tests:  x  Micro Data:  x  Antimicrobials:  x    SUBJECTIVE/OVERNIGHT/INTERVAL HX    x   Objective   Blood pressure 102/63, pulse 97, temperature 97.9 F (36.6 C), temperature source Oral, resp. rate 18, weight 64.2 kg, SpO2 99 %.        Intake/Output Summary (Last 24 hours) at 09/28/2018 1215 Last data filed at 09/28/2018 0700 Gross per 24 hour  Intake 356.02 ml  Output -  Net 356.02 ml   Filed Weights   09/26/18 1600  Weight: 64.2 kg    Examination: General Appearance:  Looks stable.  Sitting in ICU bed and talking and having lunch. Head:  Normocephalic, without obvious abnormality, atraumatic Eyes:  PERRL -yes, conjunctiva/corneas -clear     Ears:  Normal external ear canals, both ears Nose:  G tube -no Throat:  ETT TUBE -no, OG tube -no Neck:  Supple,  No enlargement/tenderness/nodules Lungs: Bilateral bibasal crackles and to me that seems to be a craniocaudal gradient ventilator   Synchrony - no Heart:  S1 and S2 normal, no murmur, CVP - no.  Pressors - no Abdomen:  Soft, no masses, no organomegaly Genitalia / Rectal:  Not done Extremities:  Extremities- intct Skin:  ntact in exposed areas . Sacral area - no Neurologic:  Sedation - none -> RASS - +1 . Moves all 4s - yes. CAM-ICU - neg . Orientation - x3+  [place] Postperative Pulmonary Risk Score - for mech ventilation dependence >48h days (year [place] set non-cardiac surgery) Comment Score  Type of surgery - abd ao aneurysm (27),  thoracic (21), neurosurgery / upper abdominal / vascular (21), neck (11) Thoracic, cabg 21  Emergency Surgery - (11) No  0  ALbumin < 3 or poor nutritional state - (9) 4.0 0  BUN > 30 -  (8) 7 0  Partial or completely dependent functional status - (7) Fully functiona 0  COPD -  (6) ILD progresssive 6  Age - 15 to 49 (4), > 70  (6) Age 66 4  TOTAL  31  Risk Stratifcation scores  - < 10, 11-19, 20-27, 28-40, >40  Moderate to moderate  High -10% chance    OTHER SCORES Score source Risk  Guptal post op prolonged mech ventilation > 48h or reintubation < 30 days - ACS 2007-2008 dataset http://lewis-perez.info/ 3.1%  [place] Safe Post Op Pnemounia risk  [employer]za 2.8%    CANET/ARISCAT Postperative Pulmonary Risk Score -for any pulm complicationomposite including respiratory failure, respiratory infection, pleural effusion, atelectasis, pneumothorax, bronchospasm, aspiration pneumonitis) Comment Score  Age - <50 (0), 50-80 (3), >80 (16) Age 5 3  Preoperative pulse ox - >96 (0), 91-95 (8), <90 (24) 98% 0  Respiratory infection in last month - Yes (17) Yes -sinus infection 17  Preoperative anemia - < 10gm% - Yes (11) 13.7gm% 0  Surgical incision - Upper abdominal (15), Thoracic (24) thoracic 24  Duration of surgery - <2h (0), 2-3h (16), >3h (23) > 3h 23  Emergency Surgery - Yes (8) no 0  TOTAL  67  Risk Stratification - Low (<26), Intermediate (26-44), High (>45)  High -42%       Resolved Hospital Problem list   x  Assessment & Plan:   #Preoperative pulmonary evaluation: Her FEV1 is less than 75% on account of the interstitial lung disease but she saturating 96% on room air and is quite functional and  has intact nutritional status and renal function.  Therefore on the objective testing models above her risk for prolonged mechanical ventilation or reintubation following bypass surgery is less than 10% [in  an 62 year old model set that did not include cardiac surgery].  In the more recent analysis the real risk is less than 3% [Gupta].  Similar risk profile for postoperative pneumonia as well.  However when he look at a composite of risk factors that include pneumonia and respiratory failure and atelectasis and effusion and oxygen dependence.  The risk for this is fairly high especially she got antibiotics and prednisone 2 weeks ago.  Nevertheless this is not prohibitive risk especially given the fact bypass is definitely indicated  Recommend 1. Recovery in ICU with Pulmonary consultation 2. DVT prophylaxis 3.  Standard - aggressive pulmonary toilet with o2, bronchodilatation, and incentive spirometry and early ambulation 4.  And other standard protocol driven measures by cardiothoracic surgery  #Interstitial lung disease -the basis for this is unknown.  It is also progressive. Athough radiologically the radiologist feels this fits in with chronic hypersensitivity pneumonitis,  extensive directed history she is denying any mold exposure or any organic antigen exposure.  Moreover the BAL neutrophilia in April 2019 does not fit in with hypersensitivity pneumonitis.  Previous autoimmune profile is negative.  Therefore surgical lung biopsy is indicated given the unclear reason for interstitial lung disease.  Plan -Repeat autoimmune and vasculitis panel -Get high-resolution CT chest repeat -Get surgical lung biopsy on the left side at 2 different locations at least if not 3 at the time of bypass surgery on Monday, September 30, 2018 (discussed with Dr. Darcey Nora who does not feel this poses additional risk to the patient].    #Chronic sinusitis -She feels this is chronic postnasal drip.  She does not feel this is an exacerbation.  Will initiate nasal steroid and nasal saline spray as prophylaxis.  Disposition / Summary of Today's Plan 09/28/18   ccu     Diet: per cads Pain/Anxiety/Delirium protocol  (if indicated): x VAP protocol (if indicated): x DVT prophylaxis: per cards GI prophylaxis: cards Hyperglycemia protocol: cards Mobility: cards Code Status: full Family Communication: patient and hsband in extensive detail     SIGNATURE    Dr. Brand Males, M.D., F.C.C.P,  Pulmonary and Critical Care Medicine Staff Physician, [place] Director - Interstitial Lung Disease  Program  Pulmonary [facility] [place], [place], [address]  Pager: [phone], If no answer or between  15:00h - 7:00h: call [phone] Telephone: [phone]  2:12 PM 09/28/2018

## 2018-09-28 NOTE — Progress Notes (Signed)
CARDIAC REHAB PHASE I   PRE:  Rate/Rhythm: 84 SR  BP:  Sitting: 94/66     SaO2: 95% RA  MODE:  Ambulation: 370 ft   POST:  Rate/Rhythm: 76  BP:  Sitting: 115/71     SaO2: 95% RA  1100-1215 Pt ambulated 370 ft in hallway.  No complaints.  Preop education completed with pt and family.  IS use education done with teach back method.  Pt obtained 1500 x4.  All questions answered.  Pt and family verbalized understanding. Pt declined watching preop video.   Noel Christmas, RN 09/28/2018 12:11 PM

## 2018-09-28 NOTE — Progress Notes (Signed)
Progress Note  Patient Name: Sarah Escobar Date of Encounter: 09/28/2018  Primary Cardiologist: new patient/ Dr Saunders Revel Dr Meda Coffee  Subjective   Denies CP or SOB. Pending CABG on Monday  Inpatient Medications    Scheduled Meds: . aspirin EC  81 mg Oral Daily  . atorvastatin  80 mg Oral q1800  . buPROPion  300 mg Oral q morning - 10a  . cholecalciferol  2,000 Units Oral Daily  . finasteride  2.5 mg Oral Daily  . fluticasone furoate-vilanterol  1 puff Inhalation Daily  . loratadine  10 mg Oral Daily  . losartan  25 mg Oral Daily  . metoprolol tartrate  12.5 mg Oral BID  . sodium chloride flush  3 mL Intravenous Q12H  . sodium chloride flush  3 mL Intravenous Q12H   Continuous Infusions: . sodium chloride    . sodium chloride 10 mL/hr at 09/28/18 0700  . sodium chloride    . heparin 900 Units/hr (09/28/18 0700)  . nitroGLYCERIN Stopped (09/26/18 2048)   PRN Meds: sodium chloride, sodium chloride, acetaminophen, ipratropium-albuterol, LORazepam, morphine injection, nitroGLYCERIN, ondansetron (ZOFRAN) IV, sodium chloride flush, sodium chloride flush   Vital Signs    Vitals:   09/28/18 0700 09/28/18 0844 09/28/18 1000 09/28/18 1036  BP: 99/61  110/68 102/63  Pulse: 78  91 97  Resp: 15  19 18   Temp: 97.9 F (36.6 C)     TempSrc: Oral     SpO2: 95% 92% 96% 99%  Weight:        Intake/Output Summary (Last 24 hours) at 09/28/2018 1104 Last data filed at 09/28/2018 0700 Gross per 24 hour  Intake 446.28 ml  Output -  Net 446.28 ml   Filed Weights   09/26/18 1600  Weight: 64.2 kg   Telemetry    NSR 70-90s Personally reviewed   ECG    No new  Physical Exam   General:  Well appearing. No resp difficulty HEENT: normal Neck: supple. no JVD. Carotids 2+ bilat; no bruits. No lymphadenopathy or thryomegaly appreciated. Cor: PMI nondisplaced. Regular rate & rhythm. No rubs, gallops or murmurs. Lungs: clear minimal crackles in bases Abdomen: soft, nontender,  nondistended. No hepatosplenomegaly. No bruits or masses. Good bowel sounds. Extremities: no cyanosis, clubbing, rash, edema Neuro: alert & orientedx3, cranial nerves grossly intact. moves all 4 extremities w/o difficulty. Affect pleasant   Labs    Chemistry Recent Labs  Lab 09/26/18 1530 09/27/18 0638  NA 133* 133*  K 3.7 3.9  CL 100 102  CO2 26 19*  GLUCOSE 90 81  BUN 7* 6*  CREATININE 0.81 0.74  CALCIUM 9.1 8.8*  PROT 7.0  --   ALBUMIN 4.0  --   AST 46*  --   ALT 40  --   ALKPHOS 108  --   BILITOT 0.4  --   GFRNONAA >60 >60  GFRAA >60 >60  ANIONGAP 7 12    Hematology Recent Labs  Lab 09/26/18 1530 09/27/18 0638 09/28/18 0341  WBC 8.7 9.6 8.8  RBC 4.79 4.36 4.39  HGB 15.4* 13.8 13.7  HCT 46.2* 42.1 42.0  MCV 96.5 96.6 95.7  MCH 32.2 31.7 31.2  MCHC 33.3 32.8 32.6  RDW 12.1 12.2 12.3  PLT 266 214 234   Cardiac Enzymes Recent Labs  Lab 09/26/18 1939 09/27/18 0058 09/27/18 0638  TROPONINI 6.55* 9.80* 4.25*    Recent Labs  Lab 09/26/18 1539  TROPIPOC 0.26*    BNPNo results for input(s): BNP, PROBNP  in the last 168 hours.   DDimer  Recent Labs  Lab 09/26/18 1530  DDIMER 0.86*    Radiology    Dg Chest 2 View  Result Date: 09/26/2018 CLINICAL DATA:  Chest pain and cough EXAM: CHEST - 2 VIEW COMPARISON:  Chest CT February 14, 2018; chest radiograph March 04, 2018 FINDINGS: There is stable fibrotic change throughout the lungs bilaterally, most notably in the base regions. There is no frank edema or consolidation. Heart size and pulmonary vascularity are normal. No adenopathy. There is aortic atherosclerosis. No bone lesions. No pneumothorax. IMPRESSION: Stable fibrotic changes throughout the lungs, most notably in the lower lobe regions. No edema or consolidation. Stable cardiac silhouette. No evident adenopathy. Electronically Signed   By: Lowella Grip III M.D.   On: 09/26/2018 15:19   Cardiac Studies   LHC: 09/26/2018 1. Diffuse, heavy  calcification of the coronary arteries with significant 2-vessel CAD.  There is an 80% ostial/proximal LAD stenosis as well as an eccentric 70% mid RCA lesion. 2. Normal left ventricular systolic function and filling pressure.    Patient Profile     62 y.o. female with ILD admitted with NSTEMI. Found to have severe 2v CAD on cath  Assessment & Plan    1. NSTEMI - 0.26 -> 6.5->9.8->4.2 - cath 09/26/18 showed severe 2 VD including 80% ostial LAD and 70% mid RCA stenosis, CT surgery was consulted for CABG anticipated surgery date - Monday 10/28, - EF 55-60% on echo 09/27/18 - Remains CP free  - Continue heparin, ASA , statin and b-blocker - Would hold off on her estrogen replacement for now in setting of NSTEMI - Pulmonary to see for pre-op evaluation with ILD  2. HLD  - LDL 70  Started on high dose atorvastatin.    3. HTN - Improved control. BP now on low end. Will stop losartan.  4. Pulmonary fibrosis - No active issue. Followed by Dr. Elsworth Soho. - Pulmonary to see for pre-op evaluation with ILD  For questions or updates, please contact [place] Please consult [email or web address] for contact info under     Signed, Glori Bickers, MD  09/28/2018, 11:04 AM

## 2018-09-28 NOTE — Progress Notes (Signed)
[place] for heparin Indication: chest pain/ACS  Allergies  Allergen Reactions  . Flagyl [Metronidazole] Other (See Comments)    Nerve pain  . Levofloxacin Rash  . Loperamide Hcl Rash  . Neomycin-Bacitracin Zn-Polymyx Rash  . Ofloxacin Rash  . Sulfonamide Derivatives Hives  . Band-Aid Liquid Bandage [New Skin] Other (See Comments)    Regular band-aid cause itching and redness  . Collodion Dermatitis  . Gabapentin Other (See Comments)    Mental status changes  . Latex Other (See Comments) and Dermatitis    "redness"  . Sulfa Antibiotics Hives  . Oxycontin [Oxycodone Hcl] Rash         Patient Measurements: Weight: 141 lb 8.6 oz (64.2 kg)   Vital Signs: Temp: 97.9 F (36.6 C) (10/26 0700) Temp Source: Oral (10/26 0700) BP: 102/63 (10/26 1036) Pulse Rate: 97 (10/26 1036)  Labs: Recent Labs    09/26/18 1530 09/26/18 1939 09/27/18 0058 09/27/18 0638 09/27/18 1550 09/28/18 0341  HGB 15.4*  --   --  13.8  --  13.7  HCT 46.2*  --   --  42.1  --  42.0  PLT 266  --   --  214  --  234  HEPARINUNFRC  --   --   --  0.16* 0.30 0.47  CREATININE 0.81  --   --  0.74  --   --   TROPONINI  --  6.55* 9.80* 4.25*  --   --     Estimated Creatinine Clearance: 68.3 mL/min (by C-G formula based on SCr of 0.74 mg/dL).   Medical History: Past Medical History:  Diagnosis Date  . Allergy   . Asthma    adult onset  . Depression   . Hyperlipidemia    LDL goal = < 100  . Hypertension   . Osteopenia    mild  . Personal history of adenomatous colonic polyps/FHx colon cancer sister and father    [name]  . Sciatica of left side    intermittent   Assessment: Pt is a 52 YOF presenting with chest tightness and bilateral tingling of both arms. Pharmacy has been consulted for heparin for ACS/chest pain.  She is now s/p cath with plans for CABG monday. Pharmacy has restarted heparin. Heparin level continues to be in therapeutic range. No  bleeding or IV issues noted.   Goal of Therapy:  Heparin level 0.3-0.7 units/ml Monitor platelets by anticoagulation protocol: Yes   Plan:  -Continue IV heparin at 900 units/hr -Daily heparin level and CBC.  Erin Hearing PharmD., BCPS Clinical Pharmacist 09/28/2018 11:22 AM

## 2018-09-28 NOTE — Progress Notes (Signed)
2 Days Post-Op Procedure(s) (LRB): LEFT HEART CATH AND CORONARY ANGIOGRAPHY (N/A) Subjective: nonstemi stable on iv heparin Echo shows good LV, no valve dz She has bronchiectasis, pulm fibrosis followd Dr Elsworth Soho Will need pulmonary consult preop  -= CABG sched Monday  Objective: Vital signs in last 24 hours: Temp:  [97.6 F (36.4 C)-98.4 F (36.9 C)] 98.4 F (36.9 C) (10/26 0400) Pulse Rate:  [74-89] 78 (10/26 0700) Cardiac Rhythm: Normal sinus rhythm (10/26 0400) Resp:  [14-32] 15 (10/26 0700) BP: (69-135)/(46-92) 99/61 (10/26 0700) SpO2:  [92 %-99 %] 92 % (10/26 0844)  Hemodynamic parameters for last 24 hours:    Intake/Output from previous day: 10/25 0701 - 10/26 0700 In: 446.3 [I.V.:446.3] Out: -  Intake/Output this shift: No intake/output data recorded.  nsr  scattered rhonchi No hematoma from cath  Lab Results: Recent Labs    09/27/18 0638 09/28/18 0341  WBC 9.6 8.8  HGB 13.8 13.7  HCT 42.1 42.0  PLT 214 234   BMET:  Recent Labs    09/26/18 1530 09/27/18 0638  NA 133* 133*  K 3.7 3.9  CL 100 102  CO2 26 19*  GLUCOSE 90 81  BUN 7* 6*  CREATININE 0.81 0.74  CALCIUM 9.1 8.8*    PT/INR: No results for input(s): LABPROT, INR in the last 72 hours. ABG No results found for: PHART, HCO3, TCO2, ACIDBASEDEF, O2SAT CBG (last 3)  No results for input(s): GLUCAP in the last 72 hours.  Assessment/Plan: S/P Procedure(s) (LRB): LEFT HEART CATH AND CORONARY ANGIOGRAPHY (N/A) pulmonary tuneup fir CABG Monday   LOS: 2 days    Tharon Aquas Trigt III 09/28/2018

## 2018-09-28 NOTE — Plan of Care (Signed)
  Problem: Education: Goal: Knowledge of General Education information will improve Description Including pain rating scale, medication(s)/side effects and non-pharmacologic comfort measures Outcome: Progressing   Problem: Clinical Measurements: Goal: Respiratory complications will improve Outcome: Progressing Goal: Cardiovascular complication will be avoided Outcome: Progressing   Problem: Nutrition: Goal: Adequate nutrition will be maintained Outcome: Progressing   Problem: Coping: Goal: Level of anxiety will decrease Outcome: Progressing   Problem: Elimination: Goal: Will not experience complications related to urinary retention Outcome: Progressing   Problem: Pain Managment: Goal: General experience of comfort will improve Outcome: Progressing   Problem: Safety: Goal: Ability to remain free from injury will improve Outcome: Progressing

## 2018-09-29 ENCOUNTER — Encounter (HOSPITAL_COMMUNITY): Payer: Self-pay | Admitting: *Deleted

## 2018-09-29 ENCOUNTER — Inpatient Hospital Stay (HOSPITAL_COMMUNITY): Payer: 59

## 2018-09-29 DIAGNOSIS — Z01810 Encounter for preprocedural cardiovascular examination: Secondary | ICD-10-CM

## 2018-09-29 LAB — POCT I-STAT 3, ART BLOOD GAS (G3+)
Acid-base deficit: 3 mmol/L — ABNORMAL HIGH (ref 0.0–2.0)
Bicarbonate: 21.6 mmol/L (ref 20.0–28.0)
O2 Saturation: 94 %
Patient temperature: 97.7
TCO2: 23 mmol/L (ref 22–32)
pCO2 arterial: 34.6 mmHg (ref 32.0–48.0)
pH, Arterial: 7.402 (ref 7.350–7.450)
pO2, Arterial: 67 mmHg — ABNORMAL LOW (ref 83.0–108.0)

## 2018-09-29 LAB — PROTIME-INR
INR: 1.05
Prothrombin Time: 13.6 s (ref 11.4–15.2)

## 2018-09-29 LAB — CBC
HCT: 38.9 % (ref 36.0–46.0)
Hemoglobin: 13.3 g/dL (ref 12.0–15.0)
MCH: 32.8 pg (ref 26.0–34.0)
MCHC: 34.2 g/dL (ref 30.0–36.0)
MCV: 95.8 fL (ref 80.0–100.0)
NRBC: 0 % (ref 0.0–0.2)
PLATELETS: 221 10*3/uL (ref 150–400)
RBC: 4.06 MIL/uL (ref 3.87–5.11)
RDW: 12.4 % (ref 11.5–15.5)
WBC: 8.6 10*3/uL (ref 4.0–10.5)

## 2018-09-29 LAB — GLUCOSE, CAPILLARY: Glucose-Capillary: 97 mg/dL (ref 70–99)

## 2018-09-29 LAB — ALDOLASE: Aldolase: 7.1 U/L (ref 3.3–10.3)

## 2018-09-29 LAB — PREPARE RBC (CROSSMATCH)

## 2018-09-29 LAB — TSH: TSH: 3.002 u[IU]/mL (ref 0.350–4.500)

## 2018-09-29 LAB — RHEUMATOID FACTOR: RHEUMATOID FACTOR: 10.9 [IU]/mL (ref 0.0–13.9)

## 2018-09-29 LAB — HEPARIN LEVEL (UNFRACTIONATED): Heparin Unfractionated: 0.42 [IU]/mL (ref 0.30–0.70)

## 2018-09-29 LAB — ABO/RH: ABO/RH(D): O POS

## 2018-09-29 LAB — ANGIOTENSIN CONVERTING ENZYME: Angiotensin-Converting Enzyme: 46 U/L (ref 14–82)

## 2018-09-29 MED ORDER — POTASSIUM CHLORIDE 2 MEQ/ML IV SOLN
80.0000 meq | INTRAVENOUS | Status: DC
  Filled 2018-09-29: qty 40

## 2018-09-29 MED ORDER — DOPAMINE-DEXTROSE 3.2-5 MG/ML-% IV SOLN
0.0000 ug/kg/min | INTRAVENOUS | Status: DC
  Filled 2018-09-29 (×2): qty 250

## 2018-09-29 MED ORDER — INSULIN REGULAR(HUMAN) IN NACL 100-0.9 UT/100ML-% IV SOLN
INTRAVENOUS | Status: AC
  Administered 2018-09-30: 1 [IU]/h via INTRAVENOUS
  Filled 2018-09-29: qty 100

## 2018-09-29 MED ORDER — BISACODYL 5 MG PO TBEC
5.0000 mg | DELAYED_RELEASE_TABLET | Freq: Once | ORAL | Status: DC
  Filled 2018-09-29: qty 1

## 2018-09-29 MED ORDER — PLASMA-LYTE 148 IV SOLN
INTRAVENOUS | Status: DC
  Filled 2018-09-29: qty 2.5

## 2018-09-29 MED ORDER — CHLORHEXIDINE GLUCONATE 4 % EX LIQD
60.0000 mL | Freq: Once | CUTANEOUS | Status: AC
  Administered 2018-09-29: 4 via TOPICAL
  Filled 2018-09-29: qty 60

## 2018-09-29 MED ORDER — TRANEXAMIC ACID 1000 MG/10ML IV SOLN
1.5000 mg/kg/h | INTRAVENOUS | Status: AC
  Administered 2018-09-30: 1.5 mg/kg/h via INTRAVENOUS
  Filled 2018-09-29: qty 25

## 2018-09-29 MED ORDER — METOPROLOL TARTRATE 12.5 MG HALF TABLET
12.5000 mg | ORAL_TABLET | Freq: Once | ORAL | Status: AC
  Administered 2018-09-30: 12.5 mg via ORAL
  Filled 2018-09-29: qty 1

## 2018-09-29 MED ORDER — CHLORHEXIDINE GLUCONATE 0.12 % MT SOLN
15.0000 mL | Freq: Once | OROMUCOSAL | Status: AC
  Administered 2018-09-30: 15 mL via OROMUCOSAL
  Filled 2018-09-29: qty 15

## 2018-09-29 MED ORDER — SODIUM CHLORIDE 0.9 % IV SOLN
750.0000 mg | INTRAVENOUS | Status: DC
  Administered 2018-09-30: 750 mg via INTRAVENOUS
  Filled 2018-09-29: qty 750

## 2018-09-29 MED ORDER — MILRINONE LACTATE IN DEXTROSE 20-5 MG/100ML-% IV SOLN
0.3000 ug/kg/min | INTRAVENOUS | Status: AC
  Administered 2018-09-30: .3 ug/kg/min via INTRAVENOUS
  Filled 2018-09-29 (×2): qty 100

## 2018-09-29 MED ORDER — VANCOMYCIN HCL 10 G IV SOLR
1250.0000 mg | INTRAVENOUS | Status: AC
  Administered 2018-09-30: 1250 mg via INTRAVENOUS
  Filled 2018-09-29: qty 1250

## 2018-09-29 MED ORDER — SODIUM CHLORIDE 0.9 % IV SOLN
1.5000 g | INTRAVENOUS | Status: AC
  Administered 2018-09-30: 1.5 g via INTRAVENOUS
  Filled 2018-09-29: qty 1.5

## 2018-09-29 MED ORDER — SODIUM CHLORIDE 0.9 % IV SOLN
INTRAVENOUS | Status: DC
  Filled 2018-09-29: qty 30

## 2018-09-29 MED ORDER — DEXMEDETOMIDINE HCL IN NACL 400 MCG/100ML IV SOLN
0.1000 ug/kg/h | INTRAVENOUS | Status: AC
  Administered 2018-09-30: .2 ug/kg/h via INTRAVENOUS
  Filled 2018-09-29 (×2): qty 100

## 2018-09-29 MED ORDER — PHENYLEPHRINE HCL-NACL 20-0.9 MG/250ML-% IV SOLN
30.0000 ug/min | INTRAVENOUS | Status: DC
  Filled 2018-09-29: qty 250

## 2018-09-29 MED ORDER — NITROGLYCERIN IN D5W 200-5 MCG/ML-% IV SOLN
2.0000 ug/min | INTRAVENOUS | Status: DC
  Filled 2018-09-29 (×2): qty 250

## 2018-09-29 MED ORDER — CHLORHEXIDINE GLUCONATE 4 % EX LIQD
60.0000 mL | Freq: Once | CUTANEOUS | Status: AC
  Administered 2018-09-30: 4 via TOPICAL
  Filled 2018-09-29: qty 60

## 2018-09-29 MED ORDER — TRANEXAMIC ACID (OHS) PUMP PRIME SOLUTION
2.0000 mg/kg | INTRAVENOUS | Status: DC
  Filled 2018-09-29: qty 1.28

## 2018-09-29 MED ORDER — TRANEXAMIC ACID (OHS) BOLUS VIA INFUSION
15.0000 mg/kg | INTRAVENOUS | Status: AC
  Administered 2018-09-30: 963 mg via INTRAVENOUS
  Filled 2018-09-29: qty 963

## 2018-09-29 MED ORDER — MAGNESIUM SULFATE 50 % IJ SOLN
40.0000 meq | INTRAMUSCULAR | Status: DC
  Filled 2018-09-29: qty 9.85

## 2018-09-29 MED ORDER — EPINEPHRINE PF 1 MG/ML IJ SOLN
0.0000 ug/min | INTRAVENOUS | Status: DC
  Filled 2018-09-29: qty 4

## 2018-09-29 MED ORDER — TEMAZEPAM 15 MG PO CAPS
15.0000 mg | ORAL_CAPSULE | Freq: Once | ORAL | Status: AC | PRN
  Administered 2018-09-29: 15 mg via ORAL
  Filled 2018-09-29: qty 1

## 2018-09-29 NOTE — Progress Notes (Signed)
Pre-op Cardiac Surgery  Carotid Findings:  Right 1% to 39%. Left - 40% to 59%, Bilateral vertebral artery flow is antegrade.  Upper Extremity Right Left  Brachial Pressures 100 Triphasic 97 Triphasic  Radial Waveforms Triphasic Triphasic  Ulnar Waveforms Triphasic Triphsic  Palmar Arch (Allen's Test) Normal Normal   Findings:  Palmar arch evaluation - Doppler waveforms remained normal bilaterally with both radial and ulnar compression.    Lower  Extremity Right Left  Dorsalis Pedis 111 Triphasic 100 Triphasic  Posterior Tibial 93 Triphasic 120 Triphasic  Ankle/Brachial Indices 1.11 1.20    Findings:  ABIs and Doppler waveforms are normal bilaterally at rest.  [name],RVS 09/29/2018,10:58 AM

## 2018-09-29 NOTE — Progress Notes (Signed)
[address] Station[address]       [place] [address]             [phone]                 3 Days Post-Op Procedure(s) (LRB): LEFT HEART CATH AND CORONARY ANGIOGRAPHY (N/A)  LOS: 3 days   Subjective: Patient seen by Dr. Mohammed Kindle and by Dr. Darcey Nora for consideration of coronary artery bypass grafting.  Patient has known underlying pulmonary disease with a diagnosis of bronchiectasis, and being evaluated for interstitial lung disease.  A month ago she noted some increasing shortness of breath when walking with her daughter after walking about a quarter of a mile, she was able to finish a mile walk.  FVC 2.10 60% predicted FEV1 1.92 71% predicted OYD:XAJOINOM the FEV1 and FVC are reduced, the FEV1/FVC ratio is increased. Patient effort was erratic which makes it impossible to adequately evaluate the flow volume loops. The FVC is reduced relative to the SVC indicating air trapping. Following administration of bronchodilators, there is no significant response  Patient was admitted with non-STEMI myocardial infarction after she developed episode of substernal chest pain radiating to both arms while at home on Friday. She underwent underwent cardiac catheterization on Friday demonstrating complex severe LAD disease and proximal right disease with preserved LV function.  Objective: Vital signs in last 24 hours: Patient Vitals for the past 24 hrs:  BP Temp Temp src Pulse Resp SpO2  09/29/18 1549 - 97.8 F (36.6 C) Oral - - -  09/29/18 1400 106/76 - - 74 17 96 %  09/29/18 1300 (!) 100/32 - - - - -  09/29/18 1200 114/72 - - 69 15 100 %  09/29/18 1100 112/69 - - 72 20 99 %  09/29/18 1000 108/65 - - 82 19 98 %  09/29/18 0900 (!) 102/55 97.7 F (36.5 C) Oral 79 13 98 %  09/29/18 0802 - - - - - 95 %  09/29/18 0800 105/67 - - 71 19 93 %  09/29/18 0700 (!) 92/54 - - 65 19 93 %  09/29/18 0600 102/61 - - 62 15 96 %  09/29/18 0500 100/65 - - 64 16 95 %  09/29/18 0452 108/61 - - 69 14 98  %  09/29/18 0400 (!) 78/44 - - 65 (!) 21 93 %  09/29/18 0300 103/61 97.7 F (36.5 C) Oral 62 13 96 %  09/29/18 0200 (!) 80/55 - - 62 13 93 %  09/29/18 0100 (!) 90/57 - - 63 13 95 %  09/29/18 0000 (!) 84/53 - - 63 13 94 %  09/28/18 2300 (!) 90/55 - - 66 14 95 %  09/28/18 2200 95/63 - - 70 14 96 %  09/28/18 2100 (!) 84/55 - - 73 15 93 %  09/28/18 2000 (!) 93/59 - - 78 17 97 %  09/28/18 1900 94/62 - - 80 18 96 %    Filed Weights   09/26/18 1600  Weight: 64.2 kg    Hemodynamic parameters for last 24 hours:    Intake/Output from previous day: 10/26 0701 - 10/27 0700 In: 453.9 [I.V.:453.9] Out: -  Intake/Output this shift: Total I/O In: 1232 [P.O.:1080; I.V.:152] Out: 700 [Urine:700]  Scheduled Meds: . aspirin EC  81 mg Oral Daily  . atorvastatin  80 mg Oral q1800  . bisacodyl  5 mg Oral Once  . buPROPion  300 mg Oral q morning - 10a  . chlorhexidine  60  mL Topical Once   And  . [START ON 09/30/2018] chlorhexidine  60 mL Topical Once  . [START ON 09/30/2018] chlorhexidine  15 mL Mouth/Throat Once  . cholecalciferol  2,000 Units Oral Daily  . finasteride  2.5 mg Oral Daily  . fluticasone  2 spray Each Nare BID  . fluticasone furoate-vilanterol  1 puff Inhalation Daily  . [START ON 09/30/2018] heparin-papaverine-plasmalyte irrigation   Irrigation To OR  . [START ON 09/30/2018] insulin   Intravenous To OR  . loratadine  10 mg Oral Daily  . [START ON 09/30/2018] magnesium sulfate  40 mEq Other To OR  . metoprolol tartrate  12.5 mg Oral BID  . [START ON 09/30/2018] metoprolol tartrate  12.5 mg Oral Once  . [START ON 09/30/2018] phenylephrine  30-200 mcg/min Intravenous To OR  . [START ON 09/30/2018] potassium chloride  80 mEq Other To OR  . sodium chloride  2 spray Each Nare BID  . sodium chloride flush  3 mL Intravenous Q12H  . sodium chloride flush  3 mL Intravenous Q12H  . [START ON 09/30/2018] tranexamic acid  15 mg/kg Intravenous To OR  . [START ON 09/30/2018]  tranexamic acid  2 mg/kg Intracatheter To OR   Continuous Infusions: . sodium chloride    . sodium chloride 250 mL (09/29/18 0451)  . sodium chloride    . [START ON 09/30/2018] cefUROXime (ZINACEF)  IV    . [START ON 09/30/2018] cefUROXime (ZINACEF)  IV    . [START ON 09/30/2018] dexmedetomidine    . [START ON 09/30/2018] DOPamine    . [START ON 09/30/2018] epinephrine    . [START ON 09/30/2018] heparin 30,000 units/NS 1000 mL solution for CELLSAVER    . heparin 900 Units/hr (09/29/18 0926)  . [START ON 09/30/2018] milrinone    . nitroGLYCERIN Stopped (09/26/18 2048)  . [START ON 09/30/2018] nitroGLYCERIN    . [START ON 09/30/2018] tranexamic acid (CYKLOKAPRON) infusion ([facility])    . [START ON 09/30/2018] vancomycin     PRN Meds:.sodium chloride, sodium chloride, acetaminophen, ipratropium-albuterol, LORazepam, morphine injection, nitroGLYCERIN, ondansetron (ZOFRAN) IV, sodium chloride flush, sodium chloride flush, temazepam  General appearance: alert, cooperative, cachectic and no distress Neurologic: intact Heart: regular rate and rhythm, S1, S2 normal, no murmur, click, rub or gallop Lungs: diminished breath sounds bibasilar and bilaterally Abdomen: soft, non-tender; bowel sounds normal; no masses,  no organomegaly Extremities: extremities normal, atraumatic, no cyanosis or edema and Homans sign is negative, no sign of DVT Wound: Right radial cath site intact  Lab Results: CBC: Recent Labs    09/28/18 0341 09/29/18 0255  WBC 8.8 8.6  HGB 13.7 13.3  HCT 42.0 38.9  PLT 234 221   BMET:  Recent Labs    09/27/18 0638  NA 133*  K 3.9  CL 102  CO2 19*  GLUCOSE 81  BUN 6*  CREATININE 0.74  CALCIUM 8.8*    PT/INR:  Recent Labs    09/29/18 0255  LABPROT 13.6  INR 1.05     Radiology Dg Chest 2 View  Result Date: 09/26/2018 CLINICAL DATA:  Chest pain and cough EXAM: CHEST - 2 VIEW COMPARISON:  Chest CT February 14, 2018; chest radiograph March 04, 2018 FINDINGS:  There is stable fibrotic change throughout the lungs bilaterally, most notably in the base regions. There is no frank edema or consolidation. Heart size and pulmonary vascularity are normal. No adenopathy. There is aortic atherosclerosis. No bone lesions. No pneumothorax. IMPRESSION: Stable fibrotic changes throughout the lungs, most  notably in the lower lobe regions. No edema or consolidation. Stable cardiac silhouette. No evident adenopathy. Electronically Signed   By: Lowella Grip III M.D.   On: 09/26/2018 15:19    Assessment/Plan: S/P Procedure(s) (LRB): LEFT HEART CATH AND CORONARY ANGIOGRAPHY (N/A)  I have reviewed the cardiac cath films and discussed with the patient and her family.  With the complex LAD disease, symptoms, technical inability for angioplasty to address the LAD disease, coronary artery bypass grafting offers her the best resolution of symptoms and decrease risk of anterior myocardial infarction.  The patient's greatest risk involving surgery primarily involves her significant restrictive lung disease, although she is not on oxygen and remains moderately active at home without pulmonary limitations.  She does have frequent bouts of pulmonary infection.  I have discussed with her obtaining a limited lung biopsy for diagnostic purposes while we are taking down the left internal mammary if it does not appear to add significant risk to the operation.  The goals risks and alternatives of the planned surgical procedure   have been discussed with the patient in detail. The risks of the procedure including death, infection, stroke, myocardial infarction, bleeding, blood transfusion, respiratory failure requiring prolonged ventilation have all been discussed specifically.  I have quoted Sarah Escobar a 4 % of perioperative mortality and a complication rate as high as 50  %. The patient's questions have been answered.Dejanee Thibeaux Lewman is willing  to proceed with the planned  procedure.  [name] MD 09/29/2018 6:31 PM     Patient ID: Sarah Escobar, female   DOB: 05-Jul-1956, 62 y.o.   MRN: [id number]

## 2018-09-29 NOTE — Progress Notes (Signed)
[place] for heparin Indication: chest pain/ACS  Allergies  Allergen Reactions  . Flagyl [Metronidazole] Other (See Comments)    Nerve pain  . Levofloxacin Rash  . Loperamide Hcl Rash  . Neomycin-Bacitracin Zn-Polymyx Rash  . Ofloxacin Rash  . Sulfonamide Derivatives Hives  . Band-Aid Liquid Bandage [New Skin] Other (See Comments)    Regular band-aid cause itching and redness  . Collodion Dermatitis  . Gabapentin Other (See Comments)    Mental status changes  . Latex Other (See Comments) and Dermatitis    "redness"  . Sulfa Antibiotics Hives  . Oxycontin [Oxycodone Hcl] Rash         Patient Measurements: Weight: 141 lb 8.6 oz (64.2 kg)   Vital Signs: Temp: 97.7 F (36.5 C) (10/27 0900) Temp Source: Oral (10/27 0900) BP: 112/69 (10/27 1100) Pulse Rate: 72 (10/27 1100)  Labs: Recent Labs    09/26/18 1530 09/26/18 1939 09/27/18 0058  09/27/18 0349 09/27/18 1550 09/28/18 0341 09/29/18 0255  HGB 15.4*  --   --   --  13.8  --  13.7 13.3  HCT 46.2*  --   --   --  42.1  --  42.0 38.9  PLT 266  --   --   --  214  --  234 221  LABPROT  --   --   --   --   --   --   --  13.6  INR  --   --   --   --   --   --   --  1.05  HEPARINUNFRC  --   --   --    < > 0.16* 0.30 0.47 0.42  CREATININE 0.81  --   --   --  0.74  --   --   --   TROPONINI  --  6.55* 9.80*  --  4.25*  --   --   --    < > = values in this interval not displayed.    Estimated Creatinine Clearance: 68.3 mL/min (by C-G formula based on SCr of 0.74 mg/dL).   Medical History: Past Medical History:  Diagnosis Date  . Allergy   . Asthma    adult onset  . Depression   . Hyperlipidemia    LDL goal = < 100  . Hypertension   . Osteopenia    mild  . Personal history of adenomatous colonic polyps/FHx colon cancer sister and father    [name]  . Sciatica of left side    intermittent   Assessment: Pt is a 63 YOF presenting with chest tightness and bilateral  tingling of both arms. Pharmacy has been consulted for heparin for ACS/chest pain.  She is now s/p cath with plans for CABG tomorrow. Pharmacy has restarted heparin. Heparin level continues to be at goal. No bleeding or IV issues noted.   Goal of Therapy:  Heparin level 0.3-0.7 units/ml Monitor platelets by anticoagulation protocol: Yes   Plan:  -Continue IV heparin at 900 units/hr -Daily heparin level and CBC.  Erin Hearing PharmD., BCPS Clinical Pharmacist 09/29/2018 11:18 AM

## 2018-09-29 NOTE — Progress Notes (Signed)
Brief progess note   S: There are no interim complaints.  No chest pain.  Bypass is scheduled for tomorrow.  I did have her do the interstitial lung disease questionnaire.  Although yesterday she denied mold exposure today it appears that there has been some amount of mold exposure in the remote past.  However the low-dose this does not seem significant.  Overall her dyspnea is only mild and she notices it at walking up stairs or walking up a hill.  She does have a cough  -Past history relevant to ILD: She has hiatal hernia diagnosed when she was around 63 years old although this is not reported in her CT chest.  Denies any autoimmune or vasculitis  Review of systems relevant to ILD: Very rarely she has had dysphagia in the past.  And also on and off for the last several years she has had a chest rash and neck rash  Family history of lung disease: Replied positive for COPD and asthma and family member  Exposure history: Smokes cigarettes between 1975 and 2003 1-1/2 packs..  Denies any vaping or marijuana use other than marijuana [place] and hobby details: Lives in a single-family home for the last 16 years.  The home was for 62 years old.  Once when she lived in a rental home 20 years ago there was a little mold in the shower curtain.  In her current home occasionally mildew will develop in the shower curtain but she will change it promptly.  She does use a steam iron but this does not have mold or mildew in it.  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  During this time she was exposed to mold plastics and she could smell plastic molding material and also 15 years ago worked in a company that was very old and there was a Arts administrator toxicity  history: She is been on nitrofurantoin briefly 20 years ago   O: Examination not done but she is alert and talking and walking in the room  Labs: High-resolution CT chest done this admission.  Results are pending.  Craniocaudal gradient ILD changes but not consistent with UIP.   A  -idiopathic ILD.  Possible mold exposure  P  -If possible surgical lung biopsy tomorrow during bypass (discussed with Dr. Darcey Nora    (> 50% of this 15 min visit spent in face to face counseling or/and coordination of care by this undersigned MD - Dr Brand Males. This includes one or more of the following documented above: discussion of test results, diagnostic or treatment recommendations, prognosis, risks and benefits of management options, instructions, education, compliance or risk-factor reduction)   SIGNATURE    Dr. Brand Males, M.D., F.C.C.P,  Pulmonary and Critical Care Medicine Staff Physician, [place] Director - Interstitial Lung Disease  Program  [facility] [place], [place], [address]  Pager: [phone], If no answer or between  15:00h - 7:00h: call [phone] Telephone: [phone]  12:41 PM 09/29/2018

## 2018-09-29 NOTE — Anesthesia Preprocedure Evaluation (Addendum)
Anesthesia Evaluation  Patient identified by MRN, date of birth, ID band Patient awake    Reviewed: Allergy & Precautions, NPO status , Patient's Chart, lab work & pertinent test results  History of Anesthesia Complications Negative for: history of anesthetic complications  Airway Mallampati: I  TM Distance: >3 FB Neck ROM: Full    Dental  (+) Upper Dentures, Lower Dentures   Pulmonary COPD,  COPD inhaler, former smoker (quit 2003),  Interstitial lung disease   breath sounds clear to auscultation       Cardiovascular hypertension, Pt. on home beta blockers and Pt. on medications + CAD (2-vessel ASCADz (LAD, RCA)) and + Past MI (NSTEMI)   Rhythm:Regular  10/19 ECHO: EF 55-60%, mild septal hypokinesis, mild aortic valve sclerosis without stenosis   Neuro/Psych PSYCHIATRIC DISORDERS Anxiety Depression sciatica  Neuromuscular disease    GI/Hepatic negative GI ROS, Neg liver ROS,   Endo/Other  negative endocrine ROS  Renal/GU negative Renal ROS     Musculoskeletal  (+) Arthritis ,   Abdominal   Peds  Hematology negative hematology ROS (+)   Anesthesia Other Findings   Reproductive/Obstetrics                            Anesthesia Physical Anesthesia Plan  ASA: IV  Anesthesia Plan: General   Post-op Pain Management:    Induction: Intravenous  PONV Risk Score and Plan: 3 and Treatment may vary due to age or medical condition  Airway Management Planned: Oral ETT  Additional Equipment: Arterial line, CVP, PA Cath, TEE and Ultrasound Guidance Line Placement  Intra-op Plan:   Post-operative Plan: Post-operative intubation/ventilation  Informed Consent: I have reviewed the patients History and Physical, chart, labs and discussed the procedure including the risks, benefits and alternatives for the proposed anesthesia with the patient or authorized representative who has indicated his/her  understanding and acceptance.   Dental advisory given  Plan Discussed with: CRNA and Surgeon  Anesthesia Plan Comments:         Anesthesia Quick Evaluation

## 2018-09-29 NOTE — Progress Notes (Signed)
Patient ID: KARON COTTERILL, female   DOB: Mar 15, 1956, 62 y.o.   MRN: [id number]   Progress Note  Patient Name: ALIJAH HYDE Date of Encounter: 09/29/2018  Primary Cardiologist: new patient/ Dr Saunders Revel Dr Meda Coffee  Subjective   No chest pain or dyspnea.  Plan for CABG with lung biopsy tomorrow.   Inpatient Medications    Scheduled Meds: . aspirin EC  81 mg Oral Daily  . atorvastatin  80 mg Oral q1800  . buPROPion  300 mg Oral q morning - 10a  . cholecalciferol  2,000 Units Oral Daily  . finasteride  2.5 mg Oral Daily  . fluticasone  2 spray Each Nare BID  . fluticasone furoate-vilanterol  1 puff Inhalation Daily  . loratadine  10 mg Oral Daily  . metoprolol tartrate  12.5 mg Oral BID  . sodium chloride  2 spray Each Nare BID  . sodium chloride flush  3 mL Intravenous Q12H  . sodium chloride flush  3 mL Intravenous Q12H   Continuous Infusions: . sodium chloride    . sodium chloride 250 mL (09/29/18 0451)  . sodium chloride    . heparin 900 Units/hr (09/28/18 0700)  . nitroGLYCERIN Stopped (09/26/18 2048)   PRN Meds: sodium chloride, sodium chloride, acetaminophen, ipratropium-albuterol, LORazepam, morphine injection, nitroGLYCERIN, ondansetron (ZOFRAN) IV, sodium chloride flush, sodium chloride flush   Vital Signs    Vitals:   09/29/18 0600 09/29/18 0700 09/29/18 0800 09/29/18 0802  BP: 102/61 (!) 92/54 105/67   Pulse: 62 65 71   Resp: 15 19 19    Temp:      TempSrc:      SpO2: 96% 93% 93% 95%  Weight:        Intake/Output Summary (Last 24 hours) at 09/29/2018 0901 Last data filed at 09/29/2018 0700 Gross per 24 hour  Intake 453.89 ml  Output -  Net 453.89 ml   Filed Weights   09/26/18 1600  Weight: 64.2 kg   Telemetry    NSR 80s Personally reviewed   ECG    No new  Physical Exam   General: NAD Neck: No JVD, no thyromegaly or thyroid nodule.  Lungs: Clear to auscultation bilaterally with normal respiratory effort. CV: Nondisplaced PMI.  Heart  regular S1/S2, no S3/S4, no murmur.  No peripheral edema.   Abdomen: Soft, nontender, no hepatosplenomegaly, no distention.  Skin: Intact without lesions or rashes.  Neurologic: Alert and oriented x 3.  Psych: Normal affect. Extremities: No clubbing or cyanosis.  HEENT: Normal.    Labs    Chemistry Recent Labs  Lab 09/26/18 1530 09/27/18 0638  NA 133* 133*  K 3.7 3.9  CL 100 102  CO2 26 19*  GLUCOSE 90 81  BUN 7* 6*  CREATININE 0.81 0.74  CALCIUM 9.1 8.8*  PROT 7.0  --   ALBUMIN 4.0  --   AST 46*  --   ALT 40  --   ALKPHOS 108  --   BILITOT 0.4  --   GFRNONAA >60 >60  GFRAA >60 >60  ANIONGAP 7 12    Hematology Recent Labs  Lab 09/27/18 0638 09/28/18 0341 09/29/18 0255  WBC 9.6 8.8 8.6  RBC 4.36 4.39 4.06  HGB 13.8 13.7 13.3  HCT 42.1 42.0 38.9  MCV 96.6 95.7 95.8  MCH 31.7 31.2 32.8  MCHC 32.8 32.6 34.2  RDW 12.2 12.3 12.4  PLT 214 234 221   Cardiac Enzymes Recent Labs  Lab 09/26/18 1939 09/27/18 0058 09/27/18 7341  TROPONINI 6.55* 9.80* 4.25*    Recent Labs  Lab 09/26/18 1539  TROPIPOC 0.26*    BNPNo results for input(s): BNP, PROBNP in the last 168 hours.   DDimer  Recent Labs  Lab 09/26/18 1530  DDIMER 0.86*    Radiology    No results found. Cardiac Studies   LHC: 09/26/2018 1. Diffuse, heavy calcification of the coronary arteries with significant 2-vessel CAD.  There is an 80% ostial/proximal LAD stenosis as well as an eccentric 70% mid RCA lesion. 2. Normal left ventricular systolic function and filling pressure.    Patient Profile     62 y.o. female with ILD admitted with NSTEMI. Found to have severe 2v CAD on cath  Assessment & Plan    1. NSTEMI: Troponin 0.26 -> 6.5 -> 9.8 -> 4.2. Cath 09/26/18 showed severe 2 VD including 80% ostial LAD and 70% mid RCA stenosis, CT surgery was consulted for CABG anticipated surgery date - Monday 10/28.  EF 55-60% on echo 09/27/18.  Remains CP free.  - Continue heparin, ASA , statin and  b-blocker - Would hold off on her estrogen replacement for now in setting of NSTEMI 2. HLD: LDL 70. Started on high dose atorvastatin.   3. HTN: Improved control. BP now on low end. Off losartan for now.  4. Pulmonary fibrosis:  Cause uncertain.  Followed by Dr. Elsworth Soho.  Seen by Dr. Chase Caller here, thinks stable for CABG.  - Will get lung biopsy with CABG.  - High resolution chest CT done, pending read.   For questions or updates, please contact [place] Please consult [email or web address] for contact info under     Signed, Loralie Champagne, MD  09/29/2018, 9:01 AM

## 2018-09-29 NOTE — Progress Notes (Signed)
3 Days Post-Op Procedure(s) (LRB): LEFT HEART CATH AND CORONARY ANGIOGRAPHY (N/A) Subjective: Patient remained stable after non-STEMI with coronary angiogram demonstrating proximal high-grade LAD ostial stenosis and moderate proximal RCA stenosis.  Normal LV function by echo.  Troponin levels are on the decrease  Patient has been carefully evaluated by pulmonary for her preop history of pulmonary fibrosis.  Repeat high-resolution CT scan shows mild diffuse fibrotic changes.  Diffusion capacity remains 78% of predicted and she is not oxygen dependent.  FVC and FEV1 are both approximately 2.0 Pulmonary medicine has requested lung biopsy at the time of CABG which will be assessed at the time of surgery.  Pre-CABG Doppler show 40-50% mild disease bilaterally Patient will be prepared for surgery by Dr. Servando Snare tomorrow.  She understands and agrees to proceed with surgery. Objective: Vital signs in last 24 hours: Temp:  [97.5 F (36.4 C)-97.7 F (36.5 C)] 97.7 F (36.5 C) (10/27 0900) Pulse Rate:  [62-96] 72 (10/27 1100) Cardiac Rhythm: Normal sinus rhythm (10/27 1000) Resp:  [13-21] 20 (10/27 1100) BP: (78-119)/(44-76) 112/69 (10/27 1100) SpO2:  [90 %-99 %] 99 % (10/27 1100)  Hemodynamic parameters for last 24 hours:    Intake/Output from previous day: 10/26 0701 - 10/27 0700 In: 453.9 [I.V.:453.9] Out: -  Intake/Output this shift: Total I/O In: 556 [P.O.:480; I.V.:76] Out: -   Lungs clear Normal sinus rhythm No hematoma at cardiac cath site  Lab Results: Recent Labs    09/28/18 0341 09/29/18 0255  WBC 8.8 8.6  HGB 13.7 13.3  HCT 42.0 38.9  PLT 234 221   BMET:  Recent Labs    09/26/18 1530 09/27/18 0638  NA 133* 133*  K 3.7 3.9  CL 100 102  CO2 26 19*  GLUCOSE 90 81  BUN 7* 6*  CREATININE 0.81 0.74  CALCIUM 9.1 8.8*    PT/INR:  Recent Labs    09/29/18 0255  LABPROT 13.6  INR 1.05   ABG No results found for: PHART, HCO3, TCO2, ACIDBASEDEF, O2SAT CBG  (last 3)  Recent Labs    09/28/18 1230 09/28/18 1610  GLUCAP 63* 105*    Assessment/Plan: S/P Procedure(s) (LRB): LEFT HEART CATH AND CORONARY ANGIOGRAPHY (N/A) Patient scheduled for multivessel CABG with possible open lung biopsy tomorrow by Dr. Servando Snare.  Orders in place.   LOS: 3 days    Tharon Aquas Trigt III 09/29/2018

## 2018-09-29 NOTE — Progress Notes (Signed)
Called received from Blood bank- there are two units of blood ready and available for patient for tomorrows surgery.

## 2018-09-30 ENCOUNTER — Inpatient Hospital Stay (HOSPITAL_COMMUNITY): Payer: 59 | Admitting: Certified Registered Nurse Anesthetist

## 2018-09-30 ENCOUNTER — Encounter (HOSPITAL_COMMUNITY): Payer: Self-pay | Admitting: Anesthesiology

## 2018-09-30 ENCOUNTER — Inpatient Hospital Stay (HOSPITAL_COMMUNITY): Payer: 59

## 2018-09-30 ENCOUNTER — Inpatient Hospital Stay (HOSPITAL_COMMUNITY)
Admission: EM | Disposition: A | Discharge status: Home Health | Payer: Self-pay | Source: Home / Self Care | Attending: Cardiothoracic Surgery

## 2018-09-30 DIAGNOSIS — I214 Non-ST elevation (NSTEMI) myocardial infarction: Principal | ICD-10-CM

## 2018-09-30 DIAGNOSIS — I25110 Atherosclerotic heart disease of native coronary artery with unstable angina pectoris: Secondary | ICD-10-CM

## 2018-09-30 DIAGNOSIS — J984 Other disorders of lung: Secondary | ICD-10-CM

## 2018-09-30 DIAGNOSIS — Z951 Presence of aortocoronary bypass graft: Secondary | ICD-10-CM

## 2018-09-30 HISTORY — PX: WEDGE RESECTION: SHX5070

## 2018-09-30 HISTORY — PX: CORONARY ARTERY BYPASS GRAFT: SHX141

## 2018-09-30 HISTORY — PX: TEE WITHOUT CARDIOVERSION: SHX5443

## 2018-09-30 LAB — CBC
HCT: 42 % (ref 36.0–46.0)
HCT: 26.7 % — ABNORMAL LOW (ref 36.0–46.0)
HEMATOCRIT: 29.5 % — AB (ref 36.0–46.0)
HEMOGLOBIN: 10 g/dL — AB (ref 12.0–15.0)
Hemoglobin: 14.4 g/dL (ref 12.0–15.0)
Hemoglobin: 8.5 g/dL — ABNORMAL LOW (ref 12.0–15.0)
MCH: 32.5 pg (ref 26.0–34.0)
MCH: 32.8 pg (ref 26.0–34.0)
MCH: 31.5 pg (ref 26.0–34.0)
MCHC: 34.3 g/dL (ref 30.0–36.0)
MCHC: 33.9 g/dL (ref 30.0–36.0)
MCHC: 31.8 g/dL (ref 30.0–36.0)
MCV: 94.8 fL (ref 80.0–100.0)
MCV: 96.7 fL (ref 80.0–100.0)
MCV: 98.9 fL (ref 80.0–100.0)
NRBC: 0 % (ref 0.0–0.2)
NRBC: 0 % (ref 0.0–0.2)
PLATELETS: 234 10*3/uL (ref 150–400)
Platelets: 118 10*3/uL — ABNORMAL LOW (ref 150–400)
Platelets: 105 10*3/uL — ABNORMAL LOW (ref 150–400)
RBC: 4.43 MIL/uL (ref 3.87–5.11)
RBC: 3.05 MIL/uL — AB (ref 3.87–5.11)
RBC: 2.7 MIL/uL — ABNORMAL LOW (ref 3.87–5.11)
RDW: 12.2 % (ref 11.5–15.5)
RDW: 12.3 % (ref 11.5–15.5)
RDW: 12.4 % (ref 11.5–15.5)
WBC: 9.7 10*3/uL (ref 4.0–10.5)
WBC: 13.8 10*3/uL — AB (ref 4.0–10.5)
WBC: 11.9 10*3/uL — ABNORMAL HIGH (ref 4.0–10.5)
nRBC: 0 % (ref 0.0–0.2)

## 2018-09-30 LAB — PROTIME-INR
INR: 1.43
PROTHROMBIN TIME: 17.3 s — AB (ref 11.4–15.2)

## 2018-09-30 LAB — HEMOGLOBIN A1C
Hgb A1c MFr Bld: 5.3 % (ref 4.8–5.6)
Mean Plasma Glucose: 105.41 mg/dL

## 2018-09-30 LAB — POCT I-STAT, CHEM 8
BUN: 5 mg/dL — ABNORMAL LOW (ref 8–23)
BUN: 4 mg/dL — ABNORMAL LOW (ref 8–23)
BUN: 5 mg/dL — ABNORMAL LOW (ref 8–23)
BUN: <3 mg/dL — ABNORMAL LOW (ref 8–23)
BUN: 4 mg/dL — ABNORMAL LOW (ref 8–23)
BUN: 4 mg/dL — ABNORMAL LOW (ref 8–23)
BUN: 5 mg/dL — ABNORMAL LOW (ref 8–23)
Calcium, Ion: 1.27 mmol/L (ref 1.15–1.40)
Calcium, Ion: 0.78 mmol/L — CL (ref 1.15–1.40)
Calcium, Ion: 1.09 mmol/L — ABNORMAL LOW (ref 1.15–1.40)
Calcium, Ion: 1.24 mmol/L (ref 1.15–1.40)
Calcium, Ion: 1.07 mmol/L — ABNORMAL LOW (ref 1.15–1.40)
Calcium, Ion: 1.08 mmol/L — ABNORMAL LOW (ref 1.15–1.40)
Calcium, Ion: 1.11 mmol/L — ABNORMAL LOW (ref 1.15–1.40)
Chloride: 102 mmol/L (ref 98–111)
Chloride: 103 mmol/L (ref 98–111)
Chloride: 105 mmol/L (ref 98–111)
Chloride: 99 mmol/L (ref 98–111)
Chloride: 100 mmol/L (ref 98–111)
Chloride: 103 mmol/L (ref 98–111)
Chloride: 105 mmol/L (ref 98–111)
Creatinine, Ser: 0.5 mg/dL (ref 0.44–1.00)
Creatinine, Ser: 0.3 mg/dL — ABNORMAL LOW (ref 0.44–1.00)
Creatinine, Ser: 0.5 mg/dL (ref 0.44–1.00)
Creatinine, Ser: 0.6 mg/dL (ref 0.44–1.00)
Creatinine, Ser: 0.5 mg/dL (ref 0.44–1.00)
Creatinine, Ser: 0.5 mg/dL (ref 0.44–1.00)
Creatinine, Ser: 0.6 mg/dL (ref 0.44–1.00)
Glucose, Bld: 91 mg/dL (ref 70–99)
Glucose, Bld: 65 mg/dL — ABNORMAL LOW (ref 70–99)
Glucose, Bld: 85 mg/dL (ref 70–99)
Glucose, Bld: 87 mg/dL (ref 70–99)
Glucose, Bld: 113 mg/dL — ABNORMAL HIGH (ref 70–99)
Glucose, Bld: 114 mg/dL — ABNORMAL HIGH (ref 70–99)
Glucose, Bld: 136 mg/dL — ABNORMAL HIGH (ref 70–99)
HCT: 36 % (ref 36.0–46.0)
HCT: 23 % — ABNORMAL LOW (ref 36.0–46.0)
HCT: 26 % — ABNORMAL LOW (ref 36.0–46.0)
HCT: 31 % — ABNORMAL LOW (ref 36.0–46.0)
HCT: 27 % — ABNORMAL LOW (ref 36.0–46.0)
HCT: 27 % — ABNORMAL LOW (ref 36.0–46.0)
HCT: 25 % — ABNORMAL LOW (ref 36.0–46.0)
Hemoglobin: 12.2 g/dL (ref 12.0–15.0)
Hemoglobin: 10.5 g/dL — ABNORMAL LOW (ref 12.0–15.0)
Hemoglobin: 7.8 g/dL — ABNORMAL LOW (ref 12.0–15.0)
Hemoglobin: 8.8 g/dL — ABNORMAL LOW (ref 12.0–15.0)
Hemoglobin: 9.2 g/dL — ABNORMAL LOW (ref 12.0–15.0)
Hemoglobin: 9.2 g/dL — ABNORMAL LOW (ref 12.0–15.0)
Hemoglobin: 8.5 g/dL — ABNORMAL LOW (ref 12.0–15.0)
Potassium: 3.8 mmol/L (ref 3.5–5.1)
Potassium: 3.9 mmol/L (ref 3.5–5.1)
Potassium: 4.2 mmol/L (ref 3.5–5.1)
Potassium: 4.7 mmol/L (ref 3.5–5.1)
Potassium: 4 mmol/L (ref 3.5–5.1)
Potassium: 4.7 mmol/L (ref 3.5–5.1)
Potassium: 4.4 mmol/L (ref 3.5–5.1)
Sodium: 137 mmol/L (ref 135–145)
Sodium: 139 mmol/L (ref 135–145)
Sodium: 139 mmol/L (ref 135–145)
Sodium: 141 mmol/L (ref 135–145)
Sodium: 135 mmol/L (ref 135–145)
Sodium: 139 mmol/L (ref 135–145)
Sodium: 138 mmol/L (ref 135–145)
TCO2: 26 mmol/L (ref 22–32)
TCO2: 26 mmol/L (ref 22–32)
TCO2: 26 mmol/L (ref 22–32)
TCO2: 29 mmol/L (ref 22–32)
TCO2: 26 mmol/L (ref 22–32)
TCO2: 26 mmol/L (ref 22–32)
TCO2: 22 mmol/L (ref 22–32)

## 2018-09-30 LAB — POCT I-STAT 3, ART BLOOD GAS (G3+)
Acid-Base Excess: 1 mmol/L (ref 0.0–2.0)
Acid-base deficit: 3 mmol/L — ABNORMAL HIGH (ref 0.0–2.0)
Acid-base deficit: 6 mmol/L — ABNORMAL HIGH (ref 0.0–2.0)
Acid-base deficit: 3 mmol/L — ABNORMAL HIGH (ref 0.0–2.0)
Bicarbonate: 25 mmol/L (ref 20.0–28.0)
Bicarbonate: 26 mmol/L (ref 20.0–28.0)
Bicarbonate: 22.7 mmol/L (ref 20.0–28.0)
Bicarbonate: 19.6 mmol/L — ABNORMAL LOW (ref 20.0–28.0)
Bicarbonate: 22.1 mmol/L (ref 20.0–28.0)
O2 Saturation: 100 %
O2 Saturation: 100 %
O2 Saturation: 96 %
O2 Saturation: 95 %
O2 Saturation: 95 %
Patient temperature: 34.9
Patient temperature: 36.5
Patient temperature: 37
TCO2: 26 mmol/L (ref 22–32)
TCO2: 27 mmol/L (ref 22–32)
TCO2: 24 mmol/L (ref 22–32)
TCO2: 21 mmol/L — ABNORMAL LOW (ref 22–32)
TCO2: 23 mmol/L (ref 22–32)
pCO2 arterial: 32.7 mmHg (ref 32.0–48.0)
pCO2 arterial: 45.3 mmHg (ref 32.0–48.0)
pCO2 arterial: 37.2 mmHg (ref 32.0–48.0)
pCO2 arterial: 37.5 mmHg (ref 32.0–48.0)
pCO2 arterial: 40.1 mmHg (ref 32.0–48.0)
pH, Arterial: 7.491 — ABNORMAL HIGH (ref 7.350–7.450)
pH, Arterial: 7.366 (ref 7.350–7.450)
pH, Arterial: 7.384 (ref 7.350–7.450)
pH, Arterial: 7.325 — ABNORMAL LOW (ref 7.350–7.450)
pH, Arterial: 7.349 — ABNORMAL LOW (ref 7.350–7.450)
pO2, Arterial: 447 mmHg — ABNORMAL HIGH (ref 83.0–108.0)
pO2, Arterial: 482 mmHg — ABNORMAL HIGH (ref 83.0–108.0)
pO2, Arterial: 76 mmHg — ABNORMAL LOW (ref 83.0–108.0)
pO2, Arterial: 81 mmHg — ABNORMAL LOW (ref 83.0–108.0)
pO2, Arterial: 79 mmHg — ABNORMAL LOW (ref 83.0–108.0)

## 2018-09-30 LAB — CREATININE, SERUM
Creatinine, Ser: 0.75 mg/dL (ref 0.44–1.00)
GFR calc Af Amer: >60 mL/min (ref >60)
GFR calc non Af Amer: >60 mL/min (ref >60)

## 2018-09-30 LAB — GLUCOSE, CAPILLARY
Glucose-Capillary: 102 mg/dL — ABNORMAL HIGH (ref 70–99)
Glucose-Capillary: 106 mg/dL — ABNORMAL HIGH (ref 70–99)
Glucose-Capillary: 112 mg/dL — ABNORMAL HIGH (ref 70–99)
Glucose-Capillary: 113 mg/dL — ABNORMAL HIGH (ref 70–99)
Glucose-Capillary: 121 mg/dL — ABNORMAL HIGH (ref 70–99)
Glucose-Capillary: 121 mg/dL — ABNORMAL HIGH (ref 70–99)
Glucose-Capillary: 122 mg/dL — ABNORMAL HIGH (ref 70–99)
Glucose-Capillary: 131 mg/dL — ABNORMAL HIGH (ref 70–99)
Glucose-Capillary: 79 mg/dL (ref 70–99)
Glucose-Capillary: 90 mg/dL (ref 70–99)

## 2018-09-30 LAB — ANTI-DNA ANTIBODY, DOUBLE-STRANDED

## 2018-09-30 LAB — SJOGRENS SYNDROME-B EXTRACTABLE NUCLEAR ANTIBODY

## 2018-09-30 LAB — APTT: APTT: 32 s (ref 24–36)

## 2018-09-30 LAB — BASIC METABOLIC PANEL
ANION GAP: 7 (ref 5–15)
BUN: 7 mg/dL — ABNORMAL LOW (ref 8–23)
CALCIUM: 9.2 mg/dL (ref 8.9–10.3)
CO2: 22 mmol/L (ref 22–32)
Chloride: 105 mmol/L (ref 98–111)
Creatinine, Ser: 0.9 mg/dL (ref 0.44–1.00)
GFR calc Af Amer: >60 mL/min (ref >60)
Glucose, Bld: 87 mg/dL (ref 70–99)
Potassium: 3.7 mmol/L (ref 3.5–5.1)
SODIUM: 134 mmol/L — AB (ref 135–145)

## 2018-09-30 LAB — HEPARIN LEVEL (UNFRACTIONATED): Heparin Unfractionated: 0.57 IU/mL (ref 0.30–0.70)

## 2018-09-30 LAB — MAGNESIUM
Magnesium: 1.7 mg/dL (ref 1.7–2.4)
Magnesium: 3.1 mg/dL — ABNORMAL HIGH (ref 1.7–2.4)

## 2018-09-30 LAB — HEMOGLOBIN AND HEMATOCRIT, BLOOD
HCT: 25.6 % — ABNORMAL LOW (ref 36.0–46.0)
Hemoglobin: 8.6 g/dL — ABNORMAL LOW (ref 12.0–15.0)

## 2018-09-30 LAB — TSH: TSH: 1.676 u[IU]/mL (ref 0.350–4.500)

## 2018-09-30 LAB — POCT I-STAT 4, (NA,K, GLUC, HGB,HCT)
Glucose, Bld: 137 mg/dL — ABNORMAL HIGH (ref 70–99)
HCT: 28 % — ABNORMAL LOW (ref 36.0–46.0)
Hemoglobin: 9.5 g/dL — ABNORMAL LOW (ref 12.0–15.0)
Potassium: 3.6 mmol/L (ref 3.5–5.1)
Sodium: 138 mmol/L (ref 135–145)

## 2018-09-30 LAB — CYCLIC CITRUL PEPTIDE ANTIBODY, IGG/IGA: CCP ANTIBODIES IGG/IGA: 6 U (ref 0–19)

## 2018-09-30 LAB — SJOGRENS SYNDROME-A EXTRACTABLE NUCLEAR ANTIBODY: SSA (Ro) (ENA) Antibody, IgG: <0.2 AI (ref 0.0–0.9)

## 2018-09-30 LAB — GLOMERULAR BASEMENT MEMBRANE ANTIBODIES: GBM AB: 3 U (ref 0–20)

## 2018-09-30 LAB — PREPARE RBC (CROSSMATCH)

## 2018-09-30 LAB — PLATELET COUNT: Platelets: 128 10*3/uL — ABNORMAL LOW (ref 150–400)

## 2018-09-30 SURGERY — CORONARY ARTERY BYPASS GRAFTING (CABG)
Anesthesia: General | Site: Chest

## 2018-09-30 MED ORDER — HEMOSTATIC AGENTS (NO CHARGE) OPTIME
TOPICAL | Status: DC | PRN
  Administered 2018-09-30: 3 via TOPICAL

## 2018-09-30 MED ORDER — HEMOSTATIC AGENTS (NO CHARGE) OPTIME
TOPICAL | Status: DC | PRN
  Administered 2018-09-30: 1 via TOPICAL

## 2018-09-30 MED ORDER — ALBUTEROL SULFATE HFA 108 (90 BASE) MCG/ACT IN AERS
INHALATION_SPRAY | RESPIRATORY_TRACT | Status: DC | PRN
  Administered 2018-09-30: 3 via RESPIRATORY_TRACT

## 2018-09-30 MED ORDER — VECURONIUM BROMIDE 10 MG IV SOLR
INTRAVENOUS | Status: DC | PRN
  Administered 2018-09-30 (×2): 5 mg via INTRAVENOUS

## 2018-09-30 MED ORDER — NOREPINEPHRINE 4 MG/250ML-% IV SOLN
0.0000 ug/min | INTRAVENOUS | Status: DC
  Filled 2018-09-30: qty 250

## 2018-09-30 MED ORDER — SODIUM CHLORIDE 0.9 % IV SOLN
INTRAVENOUS | Status: DC | PRN
  Administered 2018-09-30: 25 ug/min via INTRAVENOUS

## 2018-09-30 MED ORDER — HEPARIN SODIUM (PORCINE) 1000 UNIT/ML IJ SOLN
INTRAMUSCULAR | Status: AC
  Filled 2018-09-30: qty 2

## 2018-09-30 MED ORDER — SODIUM CHLORIDE 0.9% FLUSH
3.0000 mL | Freq: Two times a day (BID) | INTRAVENOUS | Status: DC
  Administered 2018-10-01 – 2018-10-06 (×10): 3 mL via INTRAVENOUS

## 2018-09-30 MED ORDER — SODIUM CHLORIDE 0.9 % IV SOLN
INTRAVENOUS | Status: DC | PRN
  Administered 2018-09-30: 15 ug/min via INTRAVENOUS

## 2018-09-30 MED ORDER — SODIUM CHLORIDE 0.9 % IV SOLN
INTRAVENOUS | Status: DC | PRN
  Administered 2018-09-30: 13:00:00 via INTRAVENOUS

## 2018-09-30 MED ORDER — PHENYLEPHRINE HCL 10 MG/ML IJ SOLN
INTRAMUSCULAR | Status: DC | PRN
  Administered 2018-09-30 (×3): 40 ug via INTRAVENOUS

## 2018-09-30 MED ORDER — FENTANYL CITRATE (PF) 250 MCG/5ML IJ SOLN
INTRAMUSCULAR | Status: DC | PRN
  Administered 2018-09-30: 250 ug via INTRAVENOUS
  Administered 2018-09-30: 150 ug via INTRAVENOUS
  Administered 2018-09-30: 100 ug via INTRAVENOUS
  Administered 2018-09-30: 150 ug via INTRAVENOUS
  Administered 2018-09-30: 100 ug via INTRAVENOUS
  Administered 2018-09-30: 150 ug via INTRAVENOUS

## 2018-09-30 MED ORDER — IPRATROPIUM-ALBUTEROL 0.5-2.5 (3) MG/3ML IN SOLN
3.0000 mL | Freq: Four times a day (QID) | RESPIRATORY_TRACT | Status: DC
  Administered 2018-09-30 – 2018-10-04 (×14): 3 mL via RESPIRATORY_TRACT
  Filled 2018-09-30 (×15): qty 3

## 2018-09-30 MED ORDER — MILRINONE LACTATE IN DEXTROSE 20-5 MG/100ML-% IV SOLN
0.3000 ug/kg/min | INTRAVENOUS | Status: DC
  Administered 2018-09-30 (×2): 0.3 ug/kg/min via INTRAVENOUS
  Filled 2018-09-30: qty 100

## 2018-09-30 MED ORDER — PLASMA-LYTE 148 IV SOLN
INTRAVENOUS | Status: DC | PRN
  Administered 2018-09-30: 500 mL via INTRAVASCULAR

## 2018-09-30 MED ORDER — PHENYLEPHRINE HCL-NACL 20-0.9 MG/250ML-% IV SOLN
0.0000 ug/min | INTRAVENOUS | Status: DC
  Administered 2018-09-30: 20 ug/min via INTRAVENOUS
  Filled 2018-09-30: qty 250

## 2018-09-30 MED ORDER — PROPOFOL 10 MG/ML IV BOLUS
INTRAVENOUS | Status: AC
  Filled 2018-09-30: qty 20

## 2018-09-30 MED ORDER — AMIODARONE HCL IN DEXTROSE 360-4.14 MG/200ML-% IV SOLN
60.0000 mg/h | INTRAVENOUS | Status: DC
  Administered 2018-09-30: 60 mg/h via INTRAVENOUS
  Filled 2018-09-30: qty 200

## 2018-09-30 MED ORDER — PANTOPRAZOLE SODIUM 40 MG PO TBEC
40.0000 mg | DELAYED_RELEASE_TABLET | Freq: Every day | ORAL | Status: DC
  Administered 2018-10-02 – 2018-10-16 (×15): 40 mg via ORAL
  Filled 2018-09-30 (×16): qty 1

## 2018-09-30 MED ORDER — ACETAMINOPHEN 160 MG/5ML PO SOLN: 1000.0000 mg | Freq: Four times a day (QID) | ORAL | Status: DC

## 2018-09-30 MED ORDER — ROCURONIUM BROMIDE 50 MG/5ML IV SOSY
PREFILLED_SYRINGE | INTRAVENOUS | Status: AC
  Filled 2018-09-30: qty 5

## 2018-09-30 MED ORDER — TRAMADOL HCL 50 MG PO TABS
50.0000 mg | ORAL_TABLET | ORAL | Status: DC | PRN
  Administered 2018-10-01 – 2018-10-04 (×6): 100 mg via ORAL
  Administered 2018-10-04: 50 mg via ORAL
  Administered 2018-10-05: 100 mg via ORAL
  Administered 2018-10-06: 50 mg via ORAL
  Administered 2018-10-06 (×2): 100 mg via ORAL
  Administered 2018-10-07: 50 mg via ORAL
  Administered 2018-10-07: 100 mg via ORAL
  Administered 2018-10-09 – 2018-10-11 (×6): 50 mg via ORAL
  Administered 2018-10-12: 100 mg via ORAL
  Administered 2018-10-12 – 2018-10-13 (×2): 50 mg via ORAL
  Administered 2018-10-13 – 2018-10-16 (×4): 100 mg via ORAL
  Filled 2018-09-30: qty 2
  Filled 2018-09-30: qty 1
  Filled 2018-09-30 (×3): qty 2
  Filled 2018-09-30 (×2): qty 1
  Filled 2018-09-30 (×5): qty 2
  Filled 2018-09-30: qty 1
  Filled 2018-09-30 (×4): qty 2
  Filled 2018-09-30: qty 1
  Filled 2018-09-30: qty 2
  Filled 2018-09-30 (×3): qty 1
  Filled 2018-09-30: qty 2
  Filled 2018-09-30 (×4): qty 1
  Filled 2018-09-30: qty 2

## 2018-09-30 MED ORDER — SODIUM CHLORIDE 0.9% FLUSH
10.0000 mL | Freq: Two times a day (BID) | INTRAVENOUS | Status: DC
  Administered 2018-09-30 – 2018-10-09 (×11): 10 mL

## 2018-09-30 MED ORDER — ACETAMINOPHEN 650 MG RE SUPP
650.0000 mg | Freq: Once | RECTAL | Status: AC
  Administered 2018-09-30: 650 mg via RECTAL

## 2018-09-30 MED ORDER — AMIODARONE HCL IN DEXTROSE 360-4.14 MG/200ML-% IV SOLN
30.0000 mg/h | INTRAVENOUS | Status: DC
  Administered 2018-09-30: 30 mg/h via INTRAVENOUS
  Filled 2018-09-30: qty 200

## 2018-09-30 MED ORDER — CHLORHEXIDINE GLUCONATE CLOTH 2 % EX PADS
6.0000 | MEDICATED_PAD | Freq: Every day | CUTANEOUS | Status: DC
  Administered 2018-09-30 – 2018-10-23 (×17): 6 via TOPICAL

## 2018-09-30 MED ORDER — LACTATED RINGERS IV SOLN
INTRAVENOUS | Status: DC
  Administered 2018-09-30: 16:00:00 via INTRAVENOUS

## 2018-09-30 MED ORDER — ORAL CARE MOUTH RINSE
15.0000 mL | OROMUCOSAL | Status: DC
  Administered 2018-09-30 (×2): 15 mL via OROMUCOSAL

## 2018-09-30 MED ORDER — ACETAMINOPHEN 160 MG/5ML PO SOLN: 650.0000 mg | Freq: Once | ORAL | Status: AC

## 2018-09-30 MED ORDER — BISACODYL 5 MG PO TBEC
10.0000 mg | DELAYED_RELEASE_TABLET | Freq: Every day | ORAL | Status: DC
  Administered 2018-10-01 – 2018-10-04 (×4): 10 mg via ORAL
  Filled 2018-09-30 (×5): qty 2

## 2018-09-30 MED ORDER — SODIUM CHLORIDE 0.9 % IV SOLN
INTRAVENOUS | Status: DC
  Administered 2018-09-30: 15:00:00 via INTRAVENOUS

## 2018-09-30 MED ORDER — PROTAMINE SULFATE 10 MG/ML IV SOLN
INTRAVENOUS | Status: DC | PRN
  Administered 2018-09-30: 230 mg via INTRAVENOUS

## 2018-09-30 MED ORDER — INSULIN REGULAR(HUMAN) IN NACL 100-0.9 UT/100ML-% IV SOLN
INTRAVENOUS | Status: DC
  Filled 2018-09-30: qty 100

## 2018-09-30 MED ORDER — 0.9 % SODIUM CHLORIDE (POUR BTL) OPTIME
TOPICAL | Status: DC | PRN
  Administered 2018-09-30: 5000 mL

## 2018-09-30 MED ORDER — SODIUM CHLORIDE 0.45 % IV SOLN
INTRAVENOUS | Status: DC | PRN
  Administered 2018-09-30: 19:00:00 via INTRAVENOUS

## 2018-09-30 MED ORDER — LACTATED RINGERS IV SOLN: 500.0000 mL | Freq: Once | INTRAVENOUS | Status: DC | PRN

## 2018-09-30 MED ORDER — SODIUM CHLORIDE 0.9 % IV SOLN
250.0000 mL | INTRAVENOUS | Status: DC
  Administered 2018-10-06 – 2018-10-13 (×2): 250 mL via INTRAVENOUS

## 2018-09-30 MED ORDER — METOPROLOL TARTRATE 25 MG/10 ML ORAL SUSPENSION: 12.5000 mg | Freq: Two times a day (BID) | ORAL | Status: DC

## 2018-09-30 MED ORDER — CHLORHEXIDINE GLUCONATE 0.12% ORAL RINSE (MEDLINE KIT)
15.0000 mL | Freq: Two times a day (BID) | OROMUCOSAL | Status: DC
  Administered 2018-09-30: 15 mL via OROMUCOSAL

## 2018-09-30 MED ORDER — DEXMEDETOMIDINE HCL IN NACL 200 MCG/50ML IV SOLN
0.0000 ug/kg/h | INTRAVENOUS | Status: DC
  Administered 2018-09-30: 0.5 ug/kg/h via INTRAVENOUS
  Filled 2018-09-30: qty 50

## 2018-09-30 MED ORDER — ALBUMIN HUMAN 5 % IV SOLN
INTRAVENOUS | Status: DC | PRN
  Administered 2018-09-30 (×2): via INTRAVENOUS

## 2018-09-30 MED ORDER — BISACODYL 10 MG RE SUPP: 10.0000 mg | Freq: Every day | RECTAL | Status: DC

## 2018-09-30 MED ORDER — ASPIRIN 81 MG PO CHEW
324.0000 mg | CHEWABLE_TABLET | Freq: Every day | ORAL | Status: DC
  Administered 2018-10-12: 324 mg
  Filled 2018-09-30 (×2): qty 4

## 2018-09-30 MED ORDER — SODIUM CHLORIDE 0.9 % IV SOLN
1.5000 g | Freq: Two times a day (BID) | INTRAVENOUS | Status: AC
  Administered 2018-09-30 – 2018-10-02 (×4): 1.5 g via INTRAVENOUS
  Filled 2018-09-30 (×4): qty 1.5

## 2018-09-30 MED ORDER — DOCUSATE SODIUM 100 MG PO CAPS
200.0000 mg | ORAL_CAPSULE | Freq: Every day | ORAL | Status: DC
  Administered 2018-10-01 – 2018-10-04 (×4): 200 mg via ORAL
  Filled 2018-09-30 (×5): qty 2

## 2018-09-30 MED ORDER — ORAL CARE MOUTH RINSE
15.0000 mL | OROMUCOSAL | Status: DC
  Administered 2018-10-01 (×4): 15 mL via OROMUCOSAL

## 2018-09-30 MED ORDER — FENTANYL CITRATE (PF) 250 MCG/5ML IJ SOLN
INTRAMUSCULAR | Status: AC
  Filled 2018-09-30: qty 25

## 2018-09-30 MED ORDER — CHLORHEXIDINE GLUCONATE 0.12 % MT SOLN
15.0000 mL | OROMUCOSAL | Status: AC
  Administered 2018-09-30: 15 mL via OROMUCOSAL

## 2018-09-30 MED ORDER — METOPROLOL TARTRATE 12.5 MG HALF TABLET
12.5000 mg | ORAL_TABLET | Freq: Two times a day (BID) | ORAL | Status: DC
  Administered 2018-10-01 – 2018-10-06 (×10): 12.5 mg via ORAL
  Filled 2018-09-30 (×12): qty 1

## 2018-09-30 MED ORDER — MIDAZOLAM HCL 2 MG/2ML IJ SOLN: 2.0000 mg | INTRAMUSCULAR | Status: DC | PRN

## 2018-09-30 MED ORDER — ASPIRIN EC 325 MG PO TBEC
325.0000 mg | DELAYED_RELEASE_TABLET | Freq: Every day | ORAL | Status: DC
  Administered 2018-10-01 – 2018-10-15 (×14): 325 mg via ORAL
  Filled 2018-09-30 (×14): qty 1

## 2018-09-30 MED ORDER — ACETAMINOPHEN 500 MG PO TABS
1000.0000 mg | ORAL_TABLET | Freq: Four times a day (QID) | ORAL | Status: DC
  Administered 2018-10-01 – 2018-10-05 (×19): 1000 mg via ORAL
  Filled 2018-09-30 (×17): qty 2

## 2018-09-30 MED ORDER — ALBUMIN HUMAN 5 % IV SOLN
250.0000 mL | INTRAVENOUS | Status: DC | PRN
  Administered 2018-09-30 (×3): 12.5 g via INTRAVENOUS
  Filled 2018-09-30: qty 250

## 2018-09-30 MED ORDER — ROCURONIUM BROMIDE 10 MG/ML (PF) SYRINGE
PREFILLED_SYRINGE | INTRAVENOUS | Status: DC | PRN
  Administered 2018-09-30 (×2): 50 mg via INTRAVENOUS

## 2018-09-30 MED ORDER — PROPOFOL 10 MG/ML IV BOLUS
INTRAVENOUS | Status: DC | PRN
  Administered 2018-09-30: 30 mg via INTRAVENOUS
  Administered 2018-09-30 (×2): 20 mg via INTRAVENOUS

## 2018-09-30 MED ORDER — LACTATED RINGERS IV SOLN
INTRAVENOUS | Status: DC
  Administered 2018-09-30: 16:00:00 via INTRAVENOUS
  Administered 2018-10-02: 10 mL/h via INTRAVENOUS

## 2018-09-30 MED ORDER — HEPARIN SODIUM (PORCINE) 1000 UNIT/ML IJ SOLN
INTRAMUSCULAR | Status: DC | PRN
  Administered 2018-09-30: 23000 [IU] via INTRAVENOUS

## 2018-09-30 MED ORDER — VECURONIUM BROMIDE 10 MG IV SOLR
INTRAVENOUS | Status: AC
  Filled 2018-09-30: qty 10

## 2018-09-30 MED ORDER — ESMOLOL HCL 100 MG/10ML IV SOLN
INTRAVENOUS | Status: AC
  Filled 2018-09-30: qty 10

## 2018-09-30 MED ORDER — FENTANYL CITRATE (PF) 100 MCG/2ML IJ SOLN
50.0000 ug | INTRAMUSCULAR | Status: DC | PRN
  Administered 2018-09-30 – 2018-10-01 (×4): 50 ug via INTRAVENOUS
  Filled 2018-09-30 (×4): qty 2

## 2018-09-30 MED ORDER — SODIUM CHLORIDE 0.9% FLUSH: 10.0000 mL | INTRAVENOUS | Status: DC | PRN

## 2018-09-30 MED ORDER — SODIUM CHLORIDE 0.9 % IJ SOLN
INTRAMUSCULAR | Status: AC
  Filled 2018-09-30: qty 40

## 2018-09-30 MED ORDER — MAGNESIUM SULFATE 4 GM/100ML IV SOLN
4.0000 g | Freq: Once | INTRAVENOUS | Status: AC
  Administered 2018-09-30: 4 g via INTRAVENOUS
  Filled 2018-09-30: qty 100

## 2018-09-30 MED ORDER — SODIUM CHLORIDE 0.9% FLUSH: 3.0000 mL | INTRAVENOUS | Status: DC | PRN

## 2018-09-30 MED ORDER — INSULIN REGULAR BOLUS VIA INFUSION
0.0000 [IU] | Freq: Three times a day (TID) | INTRAVENOUS | Status: DC
  Filled 2018-09-30: qty 10

## 2018-09-30 MED ORDER — PROTAMINE SULFATE 10 MG/ML IV SOLN
INTRAVENOUS | Status: AC
  Filled 2018-09-30: qty 25

## 2018-09-30 MED ORDER — AMIODARONE IV BOLUS ONLY 150 MG/100ML
INTRAVENOUS | Status: DC | PRN
  Administered 2018-09-30: 150 mg via INTRAVENOUS

## 2018-09-30 MED ORDER — FAMOTIDINE IN NACL 20-0.9 MG/50ML-% IV SOLN
20.0000 mg | Freq: Two times a day (BID) | INTRAVENOUS | Status: DC
  Administered 2018-09-30: 20 mg via INTRAVENOUS

## 2018-09-30 MED ORDER — ALBUTEROL SULFATE HFA 108 (90 BASE) MCG/ACT IN AERS
INHALATION_SPRAY | RESPIRATORY_TRACT | Status: AC
  Filled 2018-09-30: qty 6.7

## 2018-09-30 MED ORDER — VANCOMYCIN HCL IN DEXTROSE 1-5 GM/200ML-% IV SOLN
1000.0000 mg | Freq: Once | INTRAVENOUS | Status: AC
  Administered 2018-10-01: 1000 mg via INTRAVENOUS
  Filled 2018-09-30: qty 200

## 2018-09-30 MED ORDER — POTASSIUM CHLORIDE 10 MEQ/50ML IV SOLN
10.0000 meq | INTRAVENOUS | Status: AC
  Administered 2018-09-30 (×3): 10 meq via INTRAVENOUS

## 2018-09-30 MED ORDER — MIDAZOLAM HCL 10 MG/2ML IJ SOLN
INTRAMUSCULAR | Status: AC
  Filled 2018-09-30: qty 2

## 2018-09-30 MED ORDER — NITROGLYCERIN IN D5W 200-5 MCG/ML-% IV SOLN: 0.0000 ug/min | INTRAVENOUS | Status: DC

## 2018-09-30 MED ORDER — LACTATED RINGERS IV SOLN
INTRAVENOUS | Status: DC | PRN
  Administered 2018-09-30: 07:00:00 via INTRAVENOUS

## 2018-09-30 MED ORDER — METOPROLOL TARTRATE 5 MG/5ML IV SOLN
2.5000 mg | INTRAVENOUS | Status: DC | PRN
  Administered 2018-10-02: 5 mg via INTRAVENOUS
  Administered 2018-10-06: 2.5 mg via INTRAVENOUS
  Administered 2018-10-06: 5 mg via INTRAVENOUS
  Filled 2018-09-30 (×4): qty 5

## 2018-09-30 MED ORDER — ROCURONIUM BROMIDE 100 MG/10ML IV SOLN: INTRAVENOUS | Status: DC | PRN

## 2018-09-30 MED ORDER — PHENYLEPHRINE 40 MCG/ML (10ML) SYRINGE FOR IV PUSH (FOR BLOOD PRESSURE SUPPORT)
PREFILLED_SYRINGE | INTRAVENOUS | Status: AC
  Filled 2018-09-30: qty 10

## 2018-09-30 MED ORDER — ONDANSETRON HCL 4 MG/2ML IJ SOLN: 4.0000 mg | Freq: Four times a day (QID) | INTRAMUSCULAR | Status: DC | PRN

## 2018-09-30 MED ORDER — MIDAZOLAM HCL 5 MG/5ML IJ SOLN
INTRAMUSCULAR | Status: DC | PRN
  Administered 2018-09-30: 1 mg via INTRAVENOUS
  Administered 2018-09-30: 3 mg via INTRAVENOUS
  Administered 2018-09-30 (×3): 2 mg via INTRAVENOUS

## 2018-09-30 MED ORDER — METOCLOPRAMIDE HCL 5 MG/ML IJ SOLN
10.0000 mg | Freq: Four times a day (QID) | INTRAMUSCULAR | Status: AC
  Administered 2018-09-30 – 2018-10-01 (×3): 10 mg via INTRAVENOUS
  Filled 2018-09-30 (×2): qty 2

## 2018-09-30 SURGICAL SUPPLY — 84 items
ADH SKN CLS APL DERMABOND .7 (GAUZE/BANDAGES/DRESSINGS) ×2
BAG DECANTER FOR FLEXI CONT (MISCELLANEOUS) ×4 IMPLANT
BANDAGE ACE 4X5 VEL STRL LF (GAUZE/BANDAGES/DRESSINGS) ×4 IMPLANT
BANDAGE ACE 6X5 VEL STRL LF (GAUZE/BANDAGES/DRESSINGS) ×4 IMPLANT
BANDAGE ELASTIC 4 VELCRO ST LF (GAUZE/BANDAGES/DRESSINGS) ×8 IMPLANT
BANDAGE ELASTIC 6 VELCRO ST LF (GAUZE/BANDAGES/DRESSINGS) ×8 IMPLANT
BLADE STERNUM SYSTEM 6 (BLADE) ×4 IMPLANT
BLADE SURG 11 STRL SS (BLADE) ×4 IMPLANT
BNDG GAUZE ELAST 4 BULKY (GAUZE/BANDAGES/DRESSINGS) ×8 IMPLANT
CANISTER SUCT 3000ML PPV (MISCELLANEOUS) ×4 IMPLANT
CANNULA VEN 2 STAGE (MISCELLANEOUS) ×4 IMPLANT
CATH CPB KIT GERHARDT (MISCELLANEOUS) ×4 IMPLANT
CATH THORACIC 28FR (CATHETERS) ×4 IMPLANT
CLIP VESOCCLUDE SM WIDE 24/CT (CLIP) ×4 IMPLANT
CONT SPECI 4OZ STER CLIK (MISCELLANEOUS) ×12 IMPLANT
COVER WAND RF STERILE (DRAPES) ×4 IMPLANT
CRADLE DONUT ADULT HEAD (MISCELLANEOUS) ×4 IMPLANT
DERMABOND ADVANCED (GAUZE/BANDAGES/DRESSINGS) ×2
DERMABOND ADVANCED .7 DNX12 (GAUZE/BANDAGES/DRESSINGS) ×6 IMPLANT
DRAIN CHANNEL 28F RND 3/8 FF (WOUND CARE) ×4 IMPLANT
DRAPE CARDIOVASCULAR INCISE (DRAPES) ×1
DRAPE SLUSH/WARMER DISC (DRAPES) ×4 IMPLANT
DRAPE SRG 135X102X78XABS (DRAPES) ×3 IMPLANT
DRSG AQUACEL AG ADV 3.5X14 (GAUZE/BANDAGES/DRESSINGS) ×4 IMPLANT
ELECT BLADE 4.0 EZ CLEAN MEGAD (MISCELLANEOUS) ×4
ELECT REM PT RETURN 9FT ADLT (ELECTROSURGICAL) ×8
ELECTRODE BLDE 4.0 EZ CLN MEGD (MISCELLANEOUS) ×3 IMPLANT
ELECTRODE REM PT RTRN 9FT ADLT (ELECTROSURGICAL) ×6 IMPLANT
FELT TEFLON 1X6 (MISCELLANEOUS) ×8 IMPLANT
GAUZE SPONGE 4X4 12PLY STRL (GAUZE/BANDAGES/DRESSINGS) ×12 IMPLANT
GLOVE BIO SURGEON STRL SZ 6.5 (GLOVE) ×12 IMPLANT
GLOVE BIOGEL M 6.5 STRL (GLOVE) ×12 IMPLANT
GLOVE BIOGEL M 7.0 STRL (GLOVE) ×8 IMPLANT
GOWN STRL REUS W/ TWL LRG LVL3 (GOWN DISPOSABLE) ×12 IMPLANT
GOWN STRL REUS W/TWL LRG LVL3 (GOWN DISPOSABLE) ×4
HANDLE STAPLE ENDO GIA SHORT (STAPLE) ×1
HEMOSTAT POWDER KIT SURGIFOAM (HEMOSTASIS) ×12 IMPLANT
HEMOSTAT POWDER SURGIFOAM 1G (HEMOSTASIS) ×12 IMPLANT
HEMOSTAT SURGICEL 2X14 (HEMOSTASIS) ×4 IMPLANT
KIT BASIN OR (CUSTOM PROCEDURE TRAY) ×4 IMPLANT
KIT CATH SUCT 8FR (CATHETERS) ×4 IMPLANT
KIT SUCTION CATH 14FR (SUCTIONS) ×8 IMPLANT
KIT TURNOVER KIT B (KITS) ×4 IMPLANT
KIT VASOVIEW HEMOPRO 2 VH 4000 (KITS) ×4 IMPLANT
LEAD PACING MYOCARDI (MISCELLANEOUS) ×4 IMPLANT
MARKER GRAFT CORONARY BYPASS (MISCELLANEOUS) ×12 IMPLANT
NS IRRIG 1000ML POUR BTL (IV SOLUTION) ×20 IMPLANT
PACK E OPEN HEART (SUTURE) ×4 IMPLANT
PACK OPEN HEART (CUSTOM PROCEDURE TRAY) ×4 IMPLANT
PAD ARMBOARD 7.5X6 YLW CONV (MISCELLANEOUS) ×8 IMPLANT
PAD ELECT DEFIB RADIOL ZOLL (MISCELLANEOUS) ×4 IMPLANT
PENCIL BUTTON HOLSTER BLD 10FT (ELECTRODE) ×4 IMPLANT
PUNCH AORTIC ROT 4.0MM RCL 40 (MISCELLANEOUS) ×4 IMPLANT
RELOAD EGIA 45 TAN VASC (STAPLE) ×8 IMPLANT
SET CARDIOPLEGIA MPS 5001102 (MISCELLANEOUS) ×4 IMPLANT
SOLUTION ANTI FOG 6CC (MISCELLANEOUS) ×4 IMPLANT
SPONGE LAP 18X18 RF (DISPOSABLE) ×8 IMPLANT
SPONGE LAP 18X18 X RAY DECT (DISPOSABLE) ×4 IMPLANT
STAPLER ENDO GIA 12MM SHORT (STAPLE) ×3 IMPLANT
STAPLER VISISTAT 35W (STAPLE) ×8 IMPLANT
SUT BONE WAX W31G (SUTURE) ×4 IMPLANT
SUT MNCRL AB 3-0 PS2 18 (SUTURE) ×4 IMPLANT
SUT MNCRL AB 4-0 PS2 18 (SUTURE) ×4 IMPLANT
SUT PROLENE 3 0 SH1 36 (SUTURE) ×4 IMPLANT
SUT PROLENE 4 0 TF (SUTURE) ×8 IMPLANT
SUT PROLENE 6 0 C 1 30 (SUTURE) ×8 IMPLANT
SUT PROLENE 6 0 CC (SUTURE) ×8 IMPLANT
SUT PROLENE 7 0 BV1 MDA (SUTURE) ×8 IMPLANT
SUT PROLENE 7.0 RB 3 (SUTURE) ×8 IMPLANT
SUT PROLENE 8 0 BV175 6 (SUTURE) ×8 IMPLANT
SUT STEEL 6MS V (SUTURE) ×4 IMPLANT
SUT STEEL SZ 6 DBL 3X14 BALL (SUTURE) ×4 IMPLANT
SUT VIC AB 1 CTX 18 (SUTURE) ×8 IMPLANT
SUT VIC AB 2-0 CT1 27 (SUTURE) ×3
SUT VIC AB 2-0 CT1 TAPERPNT 27 (SUTURE) ×9 IMPLANT
SYSTEM SAHARA CHEST DRAIN ATS (WOUND CARE) ×4 IMPLANT
TAPE CLOTH SURG 4X10 WHT LF (GAUZE/BANDAGES/DRESSINGS) ×4 IMPLANT
TAPE PAPER 3X10 WHT MICROPORE (GAUZE/BANDAGES/DRESSINGS) ×4 IMPLANT
TOWEL GREEN STERILE (TOWEL DISPOSABLE) ×4 IMPLANT
TOWEL GREEN STERILE FF (TOWEL DISPOSABLE) ×4 IMPLANT
TRAY FOLEY SLVR 16FR TEMP STAT (SET/KITS/TRAYS/PACK) ×4 IMPLANT
TUBING INSUFFLATION (TUBING) ×4 IMPLANT
UNDERPAD 30X30 (UNDERPADS AND DIAPERS) ×4 IMPLANT
WATER STERILE IRR 1000ML POUR (IV SOLUTION) ×8 IMPLANT

## 2018-09-30 NOTE — Anesthesia Procedure Notes (Signed)
Arterial Line Insertion Start/End10/28/2019 6:50 AM, 09/30/2018 7:00 AM Performed by: Kyung Rudd, CRNA, CRNA  Patient location: Pre-op. Preanesthetic checklist: patient identified, IV checked, site marked, risks and benefits discussed, surgical consent, monitors and equipment checked, pre-op evaluation and timeout performed Lidocaine 1% used for infiltration Left, radial was placed Catheter size: 20 G Hand hygiene performed , maximum sterile barriers used  and Seldinger technique used Allen's test indicative of satisfactory collateral circulation Attempts: 1 Procedure performed without using ultrasound guided technique. Following insertion, Biopatch and dressing applied. Post procedure assessment: normal  Patient tolerated the procedure well with no immediate complications.

## 2018-09-30 NOTE — Progress Notes (Signed)
Pt transported to Pre-op w/o incident

## 2018-09-30 NOTE — Transfer of Care (Signed)
Immediate Anesthesia Transfer of Care Note  Patient: [place]  Procedure(s) Performed: CORONARY ARTERY BYPASS GRAFTING (CABG) X THREE, LIMA TO LAD, SVG TO DIAGONAL, SVG TO PDA  USING LEFT INTERNAL MAMMERY ARTERY AND RIGHT AND LEFT GREATER SAPHENOUS VEIN (N/A Chest) TRANSESOPHAGEAL ECHOCARDIOGRAM (TEE) (N/A ) LEFT UPPER LOBE WEDGE RESECTION (Left )  Patient Location: SICU  Anesthesia Type:General  Level of Consciousness: sedated and Patient remains intubated per anesthesia plan  Airway & Oxygen Therapy: Patient remains intubated per anesthesia plan and Patient placed on Ventilator (see vital sign flow sheet for setting)  Post-op Assessment: Report given to RN and Post -op Vital signs reviewed and stable  Post vital signs: Reviewed and stable  Last Vitals:  Vitals Value Taken Time  BP 107/77 09/30/2018  2:32 PM  Temp    Pulse 89 09/30/2018  2:39 PM  Resp 14 09/30/2018  2:39 PM  SpO2 96 % 09/30/2018  2:39 PM  Vitals shown include unvalidated device data.  Last Pain:  Vitals:   09/30/18 0400  TempSrc: Oral  PainSc: 0-No pain      Patients Stated Pain Goal: 0 (29/02/11 1552)  Complications: No apparent anesthesia complications

## 2018-09-30 NOTE — Plan of Care (Signed)
  Problem: Activity: Goal: Risk for activity intolerance will decrease Outcome: Progressing   Problem: Nutrition: Goal: Adequate nutrition will be maintained Outcome: Progressing   Problem: Elimination: Goal: Will not experience complications related to urinary retention Outcome: Progressing   Problem: Pain Managment: Goal: General experience of comfort will improve Outcome: Progressing   Problem: Safety: Goal: Ability to remain free from injury will improve Outcome: Progressing   Problem: Skin Integrity: Goal: Risk for impaired skin integrity will decrease Outcome: Progressing   Problem: Cardiovascular: Goal: Ability to achieve and maintain adequate cardiovascular perfusion will improve Outcome: Progressing Goal: Vascular access site(s) Level 0-1 will be maintained Outcome: Progressing   Problem: Coping: Goal: Level of anxiety will decrease Outcome: Not Progressing

## 2018-09-30 NOTE — Progress Notes (Signed)
Rapid weaning protocol 

## 2018-09-30 NOTE — Progress Notes (Addendum)
EVENING ROUNDS NOTE :     [address]       [place] [address]             [phone]                 Day of Surgery Procedure(s) (LRB): CORONARY ARTERY BYPASS GRAFTING (CABG) X THREE, LIMA TO LAD, SVG TO DIAGONAL, SVG TO PDA  USING LEFT INTERNAL MAMMERY ARTERY AND RIGHT AND LEFT GREATER SAPHENOUS VEIN (N/A) TRANSESOPHAGEAL ECHOCARDIOGRAM (TEE) (N/A) LEFT UPPER LOBE WEDGE RESECTION (Left)  Total Length of Stay:  LOS: 4 days  BP (!) 83/52   Pulse 90   Temp (!) 97.2 F (36.2 C)   Resp 17   Ht 5\' 6"  (1.676 m)   Wt 64.2 kg   SpO2 97%   BMI 22.84 kg/m   .Intake/Output      10/27 0701 - 10/28 0700 10/28 0701 - 10/29 0700   P.O. 1440    I.V. (mL/kg) 425.8 (6.6) 2745 (42.8)   Blood  300   IV Piggyback  1581.9   Total Intake(mL/kg) 1865.8 (29.1) 4626.9 (72.1)   Urine (mL/kg/hr) 2150 (1.4) 2205 (3)   Stool 0    Blood  500   Chest Tube  216   Total Output 2150 2921   Net -284.2 +1705.9        Stool Occurrence 1 x      . sodium chloride    . [START ON 10/01/2018] sodium chloride    . sodium chloride 10 mL/hr at 09/30/18 1527  . albumin human 12.5 g (09/30/18 1750)  . amiodarone 60 mg/hr (09/30/18 1800)   Followed by  . amiodarone    . cefUROXime (ZINACEF)  IV Stopped (09/30/18 1600)  . dexmedetomidine (PRECEDEX) IV infusion Stopped (09/30/18 1758)  . famotidine (PEPCID) IV 20 mg (09/30/18 1445)  . insulin    . lactated ringers    . lactated ringers 20 mL/hr at 09/30/18 1800  . lactated ringers 10 mL/hr at 09/30/18 1535  . magnesium sulfate 20 mL/hr at 09/30/18 1800  . milrinone 0.3 mcg/kg/min (09/30/18 1800)  . nitroGLYCERIN    . phenylephrine (NEO-SYNEPHRINE) Adult infusion 25 mcg/min (09/30/18 1800)  . potassium chloride 50 mL/hr at 09/30/18 1800  . vancomycin       Lab Results  Component Value Date   WBC 13.8 (H) 09/30/2018   HGB 9.5 (L) 09/30/2018   HCT 28.0 (L) 09/30/2018   PLT 118 (L) 09/30/2018   GLUCOSE 137 (H) 09/30/2018   CHOL  120 09/27/2018   TRIG 68 09/27/2018   HDL 36 (L) 09/27/2018   LDLCALC 70 09/27/2018   ALT 40 09/26/2018   AST 46 (H) 09/26/2018   NA 138 09/30/2018   K 3.6 09/30/2018   CL 103 09/30/2018   CREATININE 0.50 09/30/2018   BUN 4 (L) 09/30/2018   CO2 22 09/30/2018   TSH 3.002 09/29/2018   INR 1.43 09/30/2018   HGBA1C 5.3 09/30/2018  Paced. On both Amiodarone, Milrinone, Insulin, and Neo Synephrine drips. urine output. Chest tube output 216 since surgery. Being weaned for extubation. Supplement potassium   Sarah Pinks PA-C 09/30/2018 6:26 PM   I have seen and examined the patient and agree with the assessment and plan as outlined.  Stable early postop.  Rexene Alberts, MD 09/30/2018 9:02 PM

## 2018-09-30 NOTE — Anesthesia Procedure Notes (Signed)
Procedure Name: Intubation Date/Time: 09/30/2018 7:49 AM Performed by: Kyung Rudd, CRNA Pre-anesthesia Checklist: Patient identified, Emergency Drugs available, Suction available and Patient being monitored Patient Re-evaluated:Patient Re-evaluated prior to induction Oxygen Delivery Method: Circle system utilized Preoxygenation: Pre-oxygenation with 100% oxygen Induction Type: IV induction Ventilation: Mask ventilation without difficulty Laryngoscope Size: Mac and 3 Grade View: Grade I Tube type: Oral Tube size: 7.5 mm Number of attempts: 1 Airway Equipment and Method: Stylet Placement Confirmation: ETT inserted through vocal cords under direct vision,  positive ETCO2 and breath sounds checked- equal and bilateral Secured at: 20 cm Tube secured with: Tape Dental Injury: Teeth and Oropharynx as per pre-operative assessment

## 2018-09-30 NOTE — Procedures (Signed)
Extubation Procedure Note  Patient Details:   Name: Sarah Escobar DOB: [birth year] MRN: [id number]   Airway Documentation:    Vent end date: 09/30/18 Vent end time: 1838   Evaluation  O2 sats: stable throughout Complications: No apparent complications Patient did tolerate procedure well. Bilateral Breath Sounds: Clear, Diminished   Yes   Patient extubated per protocol to 4L  with no apparent complications. Patient achieved NIF of -35 and VC of .85L. Positive cuff leak was noted prior to extubation. Patient is alert and oriented and is able to speak. Vitals are stable. RT will continue to monitor.   Malaquias Lenker Clyda Greener 09/30/2018, 6:52 PM

## 2018-09-30 NOTE — Anesthesia Procedure Notes (Signed)
Central Venous Catheter Insertion Performed by: Oleta Mouse, MD, anesthesiologist Start/End10/28/2019 7:08 AM, 09/30/2018 7:19 AM Patient location: Pre-op. Preanesthetic checklist: patient identified, IV checked, site marked, risks and benefits discussed, surgical consent, monitors and equipment checked, pre-op evaluation, timeout performed and anesthesia consent Hand hygiene performed  and maximum sterile barriers used  PA cath was placed.Swan type:thermodilution Procedure performed without using ultrasound guided technique. Attempts: 1 Patient tolerated the procedure well with no immediate complications.

## 2018-09-30 NOTE — Anesthesia Procedure Notes (Signed)
Central Venous Catheter Insertion Performed by: Oleta Mouse, MD, anesthesiologist Start/End10/28/2019 7:08 AM, 09/30/2018 7:19 AM Patient location: Pre-op. Preanesthetic checklist: patient identified, IV checked, site marked, risks and benefits discussed, surgical consent, monitors and equipment checked, pre-op evaluation, timeout performed and anesthesia consent Position: supine Lidocaine 1% used for infiltration and patient sedated Hand hygiene performed  and maximum sterile barriers used  Catheter size: 8.5 Fr Total catheter length 10. Sheath introducer Procedure performed using ultrasound guided technique. Ultrasound Notes:anatomy identified, needle tip was noted to be adjacent to the nerve/plexus identified, no ultrasound evidence of intravascular and/or intraneural injection and image(s) printed for medical record Attempts: 1 Following insertion, line sutured, dressing applied and Biopatch. Post procedure assessment: blood return through all ports, free fluid flow and no air  Patient tolerated the procedure well with no immediate complications.

## 2018-09-30 NOTE — Progress Notes (Signed)
      [address]       [place] [address]             [phone]    Pre Procedure note for inpatients:   Sarah Escobar has been scheduled for Procedure(s): CORONARY ARTERY BYPASS GRAFTING (CABG), POSSIBLE LEFT LUNG BIOPSY (N/A) TRANSESOPHAGEAL ECHOCARDIOGRAM (TEE) (N/A) today. The various methods of treatment have been discussed with the patient. After consideration of the risks, benefits and treatment options the patient has consented to the planned procedure.   The patient has been seen and labs reviewed. There are no changes in the patient's condition to prevent proceeding with the planned procedure today.  Recent labs:  Lab Results  Component Value Date   WBC 9.7 09/30/2018   HGB 14.4 09/30/2018   HCT 42.0 09/30/2018   PLT 234 09/30/2018   GLUCOSE 87 09/30/2018   CHOL 120 09/27/2018   TRIG 68 09/27/2018   HDL 36 (L) 09/27/2018   LDLCALC 70 09/27/2018   ALT 40 09/26/2018   AST 46 (H) 09/26/2018   NA 134 (L) 09/30/2018   K 3.7 09/30/2018   CL 105 09/30/2018   CREATININE 0.90 09/30/2018   BUN 7 (L) 09/30/2018   CO2 22 09/30/2018   TSH 3.002 09/29/2018   INR 1.05 09/29/2018     [name] MD      [address] [place], [address] Office [phone]   [place]

## 2018-09-30 NOTE — Brief Op Note (Signed)
      [address]       [place] [address]             [phone]      09/30/2018  2:24 PM  PATIENT:  Sarah Escobar  62 y.o. female  PRE-OPERATIVE DIAGNOSIS:  CAD with recent nonstimi, work up for ild POST-OPERATIVE DIAGNOSIS:  Same   PROCEDURE:  Procedure(s):  CORONARY ARTERY BYPASS GRAFTING x 3 -LIMA to LAD -SVG to DIAGONAL -SVG to PDA  LUNG BIOPSY -Left Upper Lobe  OPEN [name] GREATER SAPHENOUS VEIN Right and Left Lower leg  TRANSESOPHAGEAL ECHOCARDIOGRAM (TEE) (N/A)  SURGEON:  Surgeon(s) and Role:    * [name], MD - Primary  PHYSICIAN ASSISTANT: Ellwood Handler PA-C  ANESTHESIA:   general  EBL:  500 mL   BLOOD ADMINISTERED: CELLSAVER  DRAINS: Left Pleural Chest Tubes, Mediastinal Chest Drains   LOCAL MEDICATIONS USED:  NONE  SPECIMEN:  Source of Specimen:  Lung Biopsy  DISPOSITION OF SPECIMEN:  PATHOLOGY  COUNTS:  YES    DICTATION: .Dragon Dictation  PLAN OF CARE: Admit to inpatient   PATIENT DISPOSITION:  ICU - intubated and hemodynamically stable.   Delay start of Pharmacological VTE agent (>24hrs) due to surgical blood loss or risk of bleeding: yes

## 2018-09-30 NOTE — Progress Notes (Signed)
  Amiodarone Drug - Drug Interaction Consult Note  Recommendations: Monitor vital signs. Monitor for further interactions. No changes based on current interactions.  Amiodarone is metabolized by the cytochrome P450 system and therefore has the potential to cause many drug interactions. Amiodarone has an average plasma half-life of 50 days (range 20 to 100 days).   There is potential for drug interactions to occur several weeks or months after stopping treatment and the onset of drug interactions may be slow after initiating amiodarone.   [x]  Statins: Increased risk of myopathy. Simvastatin- restrict dose to 20mg  daily. Other statins: counsel patients to report any muscle pain or weakness immediately.  []  Anticoagulants: Amiodarone can increase anticoagulant effect. Consider warfarin dose reduction. Patients should be monitored closely and the dose of anticoagulant altered accordingly, remembering that amiodarone levels take several weeks to stabilize.  []  Antiepileptics: Amiodarone can increase plasma concentration of phenytoin, the dose should be reduced. Note that small changes in phenytoin dose can result in large changes in levels. Monitor patient and counsel on signs of toxicity.  [x]  Beta blockers: increased risk of bradycardia, AV block and myocardial depression. Sotalol - avoid concomitant use.  []   Calcium channel blockers (diltiazem and verapamil): increased risk of bradycardia, AV block and myocardial depression.  []   Cyclosporine: Amiodarone increases levels of cyclosporine. Reduced dose of cyclosporine is recommended.  []  Digoxin dose should be halved when amiodarone is started.  []  Diuretics: increased risk of cardiotoxicity if hypokalemia occurs.  []  Oral hypoglycemic agents (glyburide, glipizide, glimepiride): increased risk of hypoglycemia. Patient's glucose levels should be monitored closely when initiating amiodarone therapy.   []  Drugs that prolong the QT interval:   Torsades de pointes risk may be increased with concurrent use - avoid if possible.  Monitor QTc, also keep magnesium/potassium WNL if concurrent therapy can't be avoided. Marland Kitchen Antibiotics: e.g. fluoroquinolones, erythromycin. . Antiarrhythmics: e.g. quinidine, procainamide, disopyramide, sotalol. . Antipsychotics: e.g. phenothiazines, haloperidol.  . Lithium, tricyclic antidepressants, and methadone.  Thank You,  Doylene Canard, PharmD Clinical Pharmacist  Pager: [phone] Phone: [phone] 09/30/2018 2:25 PM

## 2018-10-01 ENCOUNTER — Encounter (HOSPITAL_COMMUNITY): Payer: Self-pay | Admitting: Cardiothoracic Surgery

## 2018-10-01 ENCOUNTER — Inpatient Hospital Stay (HOSPITAL_COMMUNITY): Payer: 59

## 2018-10-01 DIAGNOSIS — Z951 Presence of aortocoronary bypass graft: Secondary | ICD-10-CM

## 2018-10-01 LAB — CREATININE, SERUM
CREATININE: 0.91 mg/dL (ref 0.44–1.00)
GFR calc Af Amer: >60 mL/min (ref >60)
GFR calc non Af Amer: >60 mL/min (ref >60)

## 2018-10-01 LAB — CBC
HCT: 28.8 % — ABNORMAL LOW (ref 36.0–46.0)
HCT: 30 % — ABNORMAL LOW (ref 36.0–46.0)
Hemoglobin: 9.4 g/dL — ABNORMAL LOW (ref 12.0–15.0)
Hemoglobin: 9.4 g/dL — ABNORMAL LOW (ref 12.0–15.0)
MCH: 32.1 pg (ref 26.0–34.0)
MCH: 31.4 pg (ref 26.0–34.0)
MCHC: 32.6 g/dL (ref 30.0–36.0)
MCHC: 31.3 g/dL (ref 30.0–36.0)
MCV: 98.3 fL (ref 80.0–100.0)
MCV: 100.3 fL — AB (ref 80.0–100.0)
NRBC: 0 % (ref 0.0–0.2)
PLATELETS: 121 10*3/uL — AB (ref 150–400)
PLATELETS: 122 10*3/uL — AB (ref 150–400)
RBC: 2.93 MIL/uL — AB (ref 3.87–5.11)
RBC: 2.99 MIL/uL — AB (ref 3.87–5.11)
RDW: 12.7 % (ref 11.5–15.5)
RDW: 12.8 % (ref 11.5–15.5)
WBC: 13.7 10*3/uL — AB (ref 4.0–10.5)
WBC: 14.1 10*3/uL — ABNORMAL HIGH (ref 4.0–10.5)
nRBC: 0 % (ref 0.0–0.2)

## 2018-10-01 LAB — ACID FAST SMEAR (AFB, MYCOBACTERIA): Acid Fast Smear: NEGATIVE

## 2018-10-01 LAB — POCT I-STAT, CHEM 8
BUN: 12 mg/dL (ref 8–23)
Calcium, Ion: 1.21 mmol/L (ref 1.15–1.40)
Chloride: 101 mmol/L (ref 98–111)
Creatinine, Ser: 0.9 mg/dL (ref 0.44–1.00)
Glucose, Bld: 112 mg/dL — ABNORMAL HIGH (ref 70–99)
HCT: 29 % — ABNORMAL LOW (ref 36.0–46.0)
Hemoglobin: 9.9 g/dL — ABNORMAL LOW (ref 12.0–15.0)
Potassium: 3.8 mmol/L (ref 3.5–5.1)
Sodium: 133 mmol/L — ABNORMAL LOW (ref 135–145)
TCO2: 23 mmol/L (ref 22–32)

## 2018-10-01 LAB — LUPUS ANTICOAGULANT PANEL
DRVVT: 34.2 s (ref 0.0–47.0)
PTT LA: 42.4 s (ref 0.0–51.9)

## 2018-10-01 LAB — BASIC METABOLIC PANEL
ANION GAP: 5 (ref 5–15)
BUN: 7 mg/dL — ABNORMAL LOW (ref 8–23)
CALCIUM: 7.6 mg/dL — AB (ref 8.9–10.3)
CO2: 21 mmol/L — ABNORMAL LOW (ref 22–32)
Chloride: 108 mmol/L (ref 98–111)
Creatinine, Ser: 0.78 mg/dL (ref 0.44–1.00)
GLUCOSE: 115 mg/dL — AB (ref 70–99)
POTASSIUM: 4 mmol/L (ref 3.5–5.1)
Sodium: 134 mmol/L — ABNORMAL LOW (ref 135–145)

## 2018-10-01 LAB — GLUCOSE, CAPILLARY
Glucose-Capillary: 104 mg/dL — ABNORMAL HIGH (ref 70–99)
Glucose-Capillary: 113 mg/dL — ABNORMAL HIGH (ref 70–99)
Glucose-Capillary: 114 mg/dL — ABNORMAL HIGH (ref 70–99)
Glucose-Capillary: 115 mg/dL — ABNORMAL HIGH (ref 70–99)
Glucose-Capillary: 118 mg/dL — ABNORMAL HIGH (ref 70–99)
Glucose-Capillary: 123 mg/dL — ABNORMAL HIGH (ref 70–99)
Glucose-Capillary: 133 mg/dL — ABNORMAL HIGH (ref 70–99)
Glucose-Capillary: 96 mg/dL (ref 70–99)

## 2018-10-01 LAB — MPO/PR-3 (ANCA) ANTIBODIES
ANCA Proteinase 3: <3.5 U/mL (ref 0.0–3.5)
Myeloperoxidase Abs: <9 U/mL (ref 0.0–9.0)

## 2018-10-01 LAB — MAGNESIUM
MAGNESIUM: 2.6 mg/dL — AB (ref 1.7–2.4)
MAGNESIUM: 2.7 mg/dL — AB (ref 1.7–2.4)

## 2018-10-01 LAB — ANTINUCLEAR ANTIBODIES, IFA: ANTINUCLEAR ANTIBODIES, IFA: NEGATIVE

## 2018-10-01 MED ORDER — OXYCODONE HCL 5 MG PO TABS
5.0000 mg | ORAL_TABLET | ORAL | Status: DC | PRN
  Administered 2018-10-01 – 2018-10-04 (×8): 5 mg via ORAL
  Filled 2018-10-01 (×7): qty 1

## 2018-10-01 MED ORDER — INSULIN ASPART 100 UNIT/ML ~~LOC~~ SOLN
0.0000 [IU] | SUBCUTANEOUS | Status: DC
  Administered 2018-10-04: 4 [IU] via SUBCUTANEOUS

## 2018-10-01 MED ORDER — ENOXAPARIN SODIUM 30 MG/0.3ML ~~LOC~~ SOLN
30.0000 mg | Freq: Every day | SUBCUTANEOUS | Status: DC
  Administered 2018-10-01 – 2018-10-22 (×22): 30 mg via SUBCUTANEOUS
  Filled 2018-10-01 (×23): qty 0.3

## 2018-10-01 MED ORDER — INSULIN ASPART 100 UNIT/ML ~~LOC~~ SOLN
0.0000 [IU] | SUBCUTANEOUS | Status: DC
  Administered 2018-10-01: 2 [IU] via SUBCUTANEOUS

## 2018-10-01 MED ORDER — FUROSEMIDE 10 MG/ML IJ SOLN
40.0000 mg | Freq: Once | INTRAMUSCULAR | Status: AC
  Administered 2018-10-01: 40 mg via INTRAVENOUS
  Filled 2018-10-01: qty 4

## 2018-10-01 MED ORDER — KETOROLAC TROMETHAMINE 15 MG/ML IJ SOLN
15.0000 mg | Freq: Four times a day (QID) | INTRAMUSCULAR | Status: AC | PRN
  Administered 2018-10-01 – 2018-10-03 (×7): 15 mg via INTRAVENOUS
  Filled 2018-10-01 (×7): qty 1

## 2018-10-01 MED ORDER — ORAL CARE MOUTH RINSE
15.0000 mL | Freq: Two times a day (BID) | OROMUCOSAL | Status: DC
  Administered 2018-10-01 – 2018-10-23 (×22): 15 mL via OROMUCOSAL

## 2018-10-01 MED ORDER — POTASSIUM CHLORIDE CRYS ER 20 MEQ PO TBCR
20.0000 meq | EXTENDED_RELEASE_TABLET | Freq: Once | ORAL | Status: AC
  Administered 2018-10-01: 20 meq via ORAL
  Filled 2018-10-01: qty 1

## 2018-10-01 MED FILL — Electrolyte-R (PH 7.4) Solution: INTRAVENOUS | Qty: 3000 | Status: AC

## 2018-10-01 MED FILL — Potassium Chloride Inj 2 mEq/ML: INTRAVENOUS | Qty: 40 | Status: AC

## 2018-10-01 MED FILL — Mannitol IV Soln 20%: INTRAVENOUS | Qty: 500 | Status: AC

## 2018-10-01 MED FILL — Lidocaine HCl(Cardiac) IV PF Soln Pref Syr 100 MG/5ML (2%): INTRAVENOUS | Qty: 5 | Status: AC

## 2018-10-01 MED FILL — Magnesium Sulfate Inj 50%: INTRAMUSCULAR | Qty: 10 | Status: AC

## 2018-10-01 MED FILL — Sodium Bicarbonate IV Soln 8.4%: INTRAVENOUS | Qty: 50 | Status: AC

## 2018-10-01 MED FILL — Sodium Chloride IV Soln 0.9%: INTRAVENOUS | Qty: 2000 | Status: AC

## 2018-10-01 MED FILL — Heparin Sodium (Porcine) Inj 1000 Unit/ML: INTRAMUSCULAR | Qty: 30 | Status: AC

## 2018-10-01 NOTE — Progress Notes (Addendum)
Patient ID: Sarah Escobar, female   DOB: [birth year], 62 y.o.   MRN: [id number] TCTS DAILY ICU PROGRESS NOTE                   [address]            [place] [address]          [phone]   1 Day Post-Op Procedure(s) (LRB): CORONARY ARTERY BYPASS GRAFTING (CABG) X THREE, LIMA TO LAD, SVG TO DIAGONAL, SVG TO PDA  USING LEFT INTERNAL MAMMERY ARTERY AND RIGHT AND LEFT GREATER SAPHENOUS VEIN (N/A) TRANSESOPHAGEAL ECHOCARDIOGRAM (TEE) (N/A) LEFT UPPER LOBE WEDGE RESECTION (Left)  Total Length of Stay:  LOS: 5 days   Subjective: States her pain is not well controlled  Objective: Vital signs in last 24 hours: Temp:  [94.6 F (34.8 C)-99.1 F (37.3 C)] 98.2 F (36.8 C) (10/29 0700) Pulse Rate:  [82-107] 95 (10/29 0700) Cardiac Rhythm: Normal sinus rhythm (10/29 0000) Resp:  [12-28] 15 (10/29 0700) BP: (83-128)/(52-85) 96/57 (10/29 0700) SpO2:  [91 %-100 %] 94 % (10/29 0700) Arterial Line BP: (88-140)/(48-81) 100/48 (10/29 0700) FiO2 (%):  [36 %-55 %] 55 % (10/29 0200) Weight:  [65.4 kg] 65.4 kg (10/29 0600)  Filed Weights   09/26/18 1600 10/01/18 0600  Weight: 64.2 kg 65.4 kg    Weight change:    Hemodynamic parameters for last 24 hours: PAP: (19-45)/(9-28) 26/15 CO:  [3.8 L/min-5.1 L/min] 4.4 L/min CI:  [2.2 L/min/m2-3 L/min/m2] 2.5 L/min/m2  Intake/Output from previous day: 10/28 0701 - 10/29 0700 In: 5707.8 [I.V.:3545.6; Blood:300; IV Piggyback:1862.3] Out: 4316 [Urine:2990; Blood:500; Chest Tube:826]  Intake/Output this shift: No intake/output data recorded.  Current Meds: Scheduled Meds: . acetaminophen  1,000 mg Oral Q6H   Or  . acetaminophen (TYLENOL) oral liquid 160 mg/5 mL  1,000 mg Per Tube Q6H  . aspirin EC  325 mg Oral Daily   Or  . aspirin  324 mg Per Tube Daily  . atorvastatin  80 mg Oral q1800  . bisacodyl  10 mg Oral Daily   Or  . bisacodyl  10 mg Rectal Daily  . buPROPion  300 mg Oral q morning - 10a  . Chlorhexidine  Gluconate Cloth  6 each Topical Daily  . cholecalciferol  2,000 Units Oral Daily  . docusate sodium  200 mg Oral Daily  . finasteride  2.5 mg Oral Daily  . fluticasone  2 spray Each Nare BID  . fluticasone furoate-vilanterol  1 puff Inhalation Daily  . insulin aspart  0-24 Units Subcutaneous Q4H  . ipratropium-albuterol  3 mL Nebulization Q6H  . loratadine  10 mg Oral Daily  . mouth rinse  15 mL Mouth Rinse Q4H  . metoprolol tartrate  12.5 mg Oral BID   Or  . metoprolol tartrate  12.5 mg Per Tube BID  . [START ON 10/02/2018] pantoprazole  40 mg Oral Daily  . sodium chloride  2 spray Each Nare BID  . sodium chloride flush  10-40 mL Intracatheter Q12H  . sodium chloride flush  3 mL Intravenous Q12H   Continuous Infusions: . sodium chloride 10 mL/hr at 10/01/18 0400  . sodium chloride    . sodium chloride 10 mL/hr at 09/30/18 1527  . albumin human 12.5 g (09/30/18 1750)  . amiodarone 30 mg/hr (10/01/18 0400)  . cefUROXime (ZINACEF)  IV 1.5 g (10/01/18 0437)  . dexmedetomidine (PRECEDEX) IV infusion 0.1 mcg/kg/hr (10/01/18 0400)  . lactated ringers    .  lactated ringers 10 mL/hr at 10/01/18 0400  . lactated ringers 10 mL/hr at 09/30/18 1535  . milrinone 0.3 mcg/kg/min (10/01/18 0400)  . nitroGLYCERIN    . phenylephrine (NEO-SYNEPHRINE) Adult infusion Stopped (10/01/18 0133)   PRN Meds:.sodium chloride, albumin human, fentaNYL (SUBLIMAZE) injection, ipratropium-albuterol, lactated ringers, LORazepam, metoprolol tartrate, ondansetron (ZOFRAN) IV, sodium chloride flush, sodium chloride flush, traMADol  General appearance: alert, cooperative and no distress Heart: regular rate and rhythm, S1, S2 normal, no murmur, click, rub or gallop Lungs: diffuse rhonchi, diminished in bilateral lower lobes Abdomen: soft, non-tender; bowel sounds normal; no masses,  no organomegaly Extremities: extremities normal, atraumatic, no cyanosis or edema Wound: clean and dry covered with a sterile  dressing  Lab Results: CBC: Recent Labs    09/30/18 2007 10/01/18 0432  WBC 11.9* 13.7*  HGB 8.5*  8.5* 9.4*  HCT 26.7*  25.0* 28.8*  PLT 105* 121*   BMET:  Recent Labs    09/30/18 0500  09/30/18 2007 10/01/18 0432  NA 134*   < > 138 134*  K 3.7   < > 4.4 4.0  CL 105   < > 105 108  CO2 22  --   --  21*  GLUCOSE 87   < > 136* 115*  BUN 7*   < > 5* 7*  CREATININE 0.90   < > 0.75  0.60 0.78  CALCIUM 9.2  --   --  7.6*   < > = values in this interval not displayed.    CMET: Lab Results  Component Value Date   WBC 13.7 (H) 10/01/2018   HGB 9.4 (L) 10/01/2018   HCT 28.8 (L) 10/01/2018   PLT 121 (L) 10/01/2018   GLUCOSE 115 (H) 10/01/2018   CHOL 120 09/27/2018   TRIG 68 09/27/2018   HDL 36 (L) 09/27/2018   LDLCALC 70 09/27/2018   ALT 40 09/26/2018   AST 46 (H) 09/26/2018   NA 134 (L) 10/01/2018   K 4.0 10/01/2018   CL 108 10/01/2018   CREATININE 0.78 10/01/2018   BUN 7 (L) 10/01/2018   CO2 21 (L) 10/01/2018   TSH 1.676 09/30/2018   INR 1.43 09/30/2018   HGBA1C 5.3 09/30/2018      PT/INR:  Recent Labs    09/30/18 1437  LABPROT 17.3*  INR 1.43   Radiology: Dg Chest Port 1 View  Result Date: 09/30/2018 CLINICAL DATA:  Status post coronary bypass graft. EXAM: PORTABLE CHEST 1 VIEW COMPARISON:  Radiographs of September 26, 2018. FINDINGS: Endotracheal and nasogastric tubes appear to be in grossly good position. Right internal jugular Swan-Ganz catheter is noted with tip directed into right pulmonary artery. Left-sided chest tube is noted without pneumothorax. Stable fibrotic changes are noted in both lung bases. No pneumothorax or pleural effusion is noted. Bony thorax is unremarkable. IMPRESSION: Endotracheal and nasogastric tubes are in grossly good position. Left-sided chest tube is noted without pneumothorax. Stable fibrotic changes noted in both lower lobes. Electronically Signed   By: Marijo Conception, M.D.   On: 09/30/2018 14:56      Assessment/Plan: S/P Procedure(s) (LRB): CORONARY ARTERY BYPASS GRAFTING (CABG) X THREE, LIMA TO LAD, SVG TO DIAGONAL, SVG TO PDA  USING LEFT INTERNAL MAMMERY ARTERY AND RIGHT AND LEFT GREATER SAPHENOUS VEIN (N/A) TRANSESOPHAGEAL ECHOCARDIOGRAM (TEE) (N/A) LEFT UPPER LOBE WEDGE RESECTION (Left)  1. CV-NSR in the 90s, BP well controlled. Discontinue Amio and wean Milrinone.  2. Pulm-Venturi mask, Continue to encourage flutter valve and incentive spirometer. Extubated around  6pm last night.  3. Renal-creatinine 0.78, electrolytes okay. Mag 2.6, Potassium 4.0 4. H and H 9.4/28.8, platelets 121k 5. Endo-blood glucose is well controlled  6. Continue lovenox daily for DVT prophylaxis 7. In a lot of pain-taking tramadol and added toradol.   Plan: discontinue swan ganz catheter, Weaning milrinone, discontinue Amio, keep foley cath until more mobile. OOB to chair. Work on pulmonary toilet.    Nicholes Rough, PA-C  [name] 10/01/2018 7:21 AM

## 2018-10-01 NOTE — Care Management Obs Status (Deleted)
[facility] NOTIFICATION   Patient Details  Name: Sarah Escobar MRN: [id number] Date of Birth: 08-Jun-1956   Medicare Observation Status Notification Given:  Yes  Patient is pleasantly confused and unable to sign. CM attempted to reach family with no response. MOON completed and placed in patient's room.   Midge Minium RN, BSN, NCM-BC, ACM-RN [phone] 10/01/2018, 4:28 PM

## 2018-10-01 NOTE — Discharge Summary (Signed)
Physician Discharge Summary  Patient ID: Sarah Escobar MRN: [id number] DOB/AGE: Aug 13, 1956 62 y.o.  Admit date: 09/26/2018 Discharge date: 10/23/2018  Admission Diagnoses: Patient Active Problem List   Diagnosis Date Noted  . NSTEMI (non-ST elevated myocardial infarction) ([facility]) 09/26/2018  . Non-ST elevation (NSTEMI) myocardial infarction ([facility]) 09/26/2018  . Acute pharyngitis 06/07/2018  . Yellow nails 01/14/2018  . Anxiety 04/30/2017  . Hair loss 01/11/2017  . Well adult exam 01/11/2017  . Skin irritation 01/18/2016  . Elevated LFTs 01/10/2016  . Sleep disorder 06/01/2015  . Coronary artery calcification seen on CAT scan 03/19/2015  . Nonscarring hair loss 09/25/2014  . High serum thyroxine (T4) 09/25/2014  . Bronchiectasis without acute exacerbation ([facility]) 04/07/2014  . ILD (interstitial lung disease) ([facility]) 02/25/2014  . DDD (degenerative disc disease), lumbosacral 12/24/2012  . Environmental allergies 06/20/2011  . OSTEOPENIA 01/05/2010  . Depression 09/10/2009  . Essential hypertension 09/10/2009  . History of adenomatous polyp of colon 09/10/2009  . Sinusitis, acute maxillary 11/30/2008  . Asthmatic bronchitis 05/28/2008  . HYPERLIPIDEMIA 10/11/2007    Discharge Diagnoses:  Active Problems:   NSTEMI (non-ST elevated myocardial infarction) ([facility])   Non-ST elevation (NSTEMI) myocardial infarction (H[facility])   S/P CABG x 3   Goals of care, counseling/discussion   Palliative care encounter   IPF (idiopathic pulmonary fibrosis) (H[facility])   Chronic respiratory failure with hypoxia (HCC)   Acute pulmonary edema (HCC)   Hypokalemia   Acute on chronic respiratory failure with hypoxia (HCC)   HCAP (healthcare-associated pneumonia)   Malnutrition of moderate degree   Discharged Condition: good  HPI:  The patient is a 62 year old woman with history of hypertension, hyperlipidemia, a strong family history of premature heart disease, remote smoking, and 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 Elsworth Soho who was in her usual state of health until yesterday morning when she woke up with substernal chest pressure radiating into her neck and back as well as down both arms.  This was associated with diaphoresis and lasted for about 4 hours.  She took some pain medication and it resolved.  She developed another episode around noon with worsening symptoms and came to the emergency room.  Her initial troponin was 0.26 and the second was 6.55 with a peak of 9.8.  Letter cardiogram showed no acute ischemic changes.  Cardiac catheterization was performed late yesterday afternoon by Dr. Saunders Revel and showed an 80% ostial and proximal LAD stenosis and a heavily calcified segment.  There is also 70% mid right coronary stenosis and a large dominant vessel.  Left ventricular systolic function was normal with an end-diastolic pressure of 6 mmHg.  She has had no further symptoms since catheterization.  She was admitted for further care.  Hospital Course:   The patient was chest pain free during admission.  It was felt coronary bypass grafting would be indicated and TCTS consult was requested.  The patient was evaluated by Dr. Cyndia Bent who was in agreement coronary bypass grafting would be indicated.  The risks and benefits of the procedure were explained to the patient and she was agreeable to proceed.  Due to scheduling Dr. Servando Snare would be performing her surgery and she was agreeable to this.  The patient was taken to the operating room on 09/30/2018.  She underwent CABG x 3 utilizing LIMA to LAD, SVG to DIAGONAL, and SVG to PDA.  She also underwent lung biopsy of her left upper lobe.  She also underwent open harvest of  greater saphenous vein from the right and left lower leg.  The patient was extubated the evening of surgery.  During her stay in the SICU the patient was weaned off Milrinone and Amiodarone as tolerated.  Pulmonary consult was obtained due to her  underlying pulmonary disease.  Her chest tubes were removed on 10/02/2018.  She initially maintained NSR.  However, she converted back into Atrial Fibrillation.  She was re-bolused with Amiodarone, but ultimately did not convert.  She continued to experience PAF.  Cardiology consult was obtained and they recommended cessation of Amiodarone due to underlying lung disease, Amiodarone would not be a good drug for her to be taking.  She was started on Digoxin therapy.  However, she did not achieve control with this and was ultimately transitioned back to Amiodarone for rate control.  She was not felt to be a candidate for anticoagulation.  She was again taken back off Amiodarone due to lung  The patient had persistent respiratory issues throughout her stay.  CCM followed the patient closely and her pulmonary medications were optimized as tolerated.  She was treated with Rocephin for Serratia Marcescens in her sputum.  She has required high flow oxygen during her stay which has been very difficult to wean.  She was hyponatremic and started on Tolvaptan with improvement of her sodium level.  She was transfused packed cells for expected post operative blood loss anemia.  She has converted into NSR.  Her volume status has significantly improved with diuresis.  She has been treated with steroids per CCM.  She has been weaned down to 3L oxygen at rest but continues to require 6L via [place] with activity as she continues to desaturate.  She is medically stable for transfer to the telemetry unit on 10/18/2018.  She has continued to make progress.  She remains stable on 3L of oxygen at rest.  She does continue to require higher levels of oxygen with activity.  She remains in NSR and her pacing wires were removed without difficulty prior to discharge.  Her staples have all been removed from her bilateral lower extremity EVH sites.  These wounds remain mildly dehisced at the skin edges with some minor oozing, attributed to swelling.   She remains on steroids and will do so for the foreseeable future which will likely delay healing in her leg wounds.  Her sternotomy is well healed.  She has been optimized on her respiratory medications.  She is ambulating with minimal difficulty.  She is medically stable for discharge home today.  Consults: None  Significant Diagnostic Studies:   CLINICAL DATA:  Status post coronary bypass graft.  EXAM: PORTABLE CHEST 1 VIEW  COMPARISON:  Radiographs of September 26, 2018.  FINDINGS: Endotracheal and nasogastric tubes appear to be in grossly good position. Right internal jugular Swan-Ganz catheter is noted with tip directed into right pulmonary artery. Left-sided chest tube is noted without pneumothorax. Stable fibrotic changes are noted in both lung bases. No pneumothorax or pleural effusion is noted. Bony thorax is unremarkable.  IMPRESSION: Endotracheal and nasogastric tubes are in grossly good position. Left-sided chest tube is noted without pneumothorax. Stable fibrotic changes noted in both lower lobes.   Electronically Signed   By: Marijo Conception, M.D.   On: 09/30/2018 14:56  Treatments:   Coronary artery bypass grafting x3 with the left internal mammary to the left anterior descending coronary artery, reverse saphenous vein graft to the diagonal coronary artery, reverse saphenous vein graft to the distal right  coronary artery with bilateral lower leg open left lung biopsy, wedge resection.  Discharge Exam: Blood pressure 95/60, pulse 83, temperature 98.1 F (36.7 C), temperature source Oral, resp. rate 18, height 5\' 6"  (1.676 m), weight 57.1 kg, SpO2 98 %.   General appearance: alert, cooperative and no distress Heart: regular rate and rhythm Lungs: clear to auscultation bilaterally Abdomen: soft, non-tender; bowel sounds normal; no masses,  no organomegaly Extremities: edema trace Wound: clean and dry, mild skin dehiscence, mild oozing from [facility] sites, due  to swelling  Discharge disposition: 01-Home or Self Care  Discharge Medications:  The patient has been discharged on:   1.Beta Blocker:  Yes [ x  ]                              No   [   ]                              If No, reason:  2.Ace Inhibitor/ARB: Yes [   ]                                     No  [  X  ]                                     If No, reason: labile BP  3.Statin:   Yes [ x  ]                  No  [   ]                  If No, reason:  4.Ecasa:  Yes  [x   ]                  No   [   ]                  If No, reason:   Discharge Instructions    Amb Referral to Cardiac Rehabilitation   Complete by:  As directed    Diagnosis:  CABG   CABG X ___:  3     Allergies as of 10/23/2018      Reactions   Flagyl [metronidazole] Other (See Comments)   Nerve pain   Levofloxacin Rash   Loperamide Hcl Rash   Neomycin-bacitracin Zn-polymyx Rash   Ofloxacin Rash   Sulfonamide Derivatives Hives   Band-aid Liquid Bandage [new Skin] Other (See Comments)   Regular band-aid cause itching and redness   Collodion Dermatitis   Gabapentin Other (See Comments)   Mental status changes   Latex Other (See Comments), Dermatitis   "redness"   Sulfa Antibiotics Hives   Oxycontin [oxycodone Hcl] Rash         Medication List    STOP taking these medications   albuterol (2.5 MG/3ML) 0.083% nebulizer solution Commonly known as:  PROVENTIL   ibuprofen 200 MG tablet Commonly known as:  ADVIL,MOTRIN     TAKE these medications   Biotin 5000 MCG Caps Take 5,000 mcg by mouth daily.   buPROPion 300 MG 24 hr tablet Commonly known as:  WELLBUTRIN XL Take 1 tablet (300 mg total) by mouth every  morning. Must keep upcoming appt to get refills   Cholecalciferol 50 MCG (2000 UT) Tabs Take 2,000 Units by mouth daily.   digoxin 0.125 MG tablet Commonly known as:  LANOXIN Take 1 tablet (0.125 mg total) by mouth daily. Start taking on:  10/24/2018   estrogen-methylTESTOSTERone  0.625-1.25 MG tablet Take 1 tablet by mouth daily.   feeding supplement (ENSURE ENLIVE) Liqd Take 237 mLs by mouth 2 (two) times daily between meals. What changed:  when to take this   fexofenadine 180 MG tablet Commonly known as:  ALLEGRA Take 180 mg by mouth daily.   finasteride 5 MG tablet Commonly known as:  PROSCAR Take 2.5 mg by mouth daily.   Flaxseed Oil 1000 MG Caps Take 1,000 mg by mouth 2 (two) times daily.   fluticasone 50 MCG/ACT nasal spray Commonly known as:  FLONASE USE 1 SPRAY IN EACH NOSTRIL TWO TIMES A DAY AS NEEDED What changed:  See the new instructions.   fluticasone furoate-vilanterol 100-25 MCG/INH Aepb Commonly known as:  BREO ELLIPTA USE 1 INHALATION DAILY What changed:    how much to take  how to take this  when to take this  additional instructions   FLUTTER Devi Use a directed.   furosemide 20 MG tablet Commonly known as:  LASIX Take 1 tablet (20 mg total) by mouth daily. Start taking on:  10/24/2018   guaiFENesin 600 MG 12 hr tablet Commonly known as:  MUCINEX Take 2 tablets (1,200 mg total) by mouth 2 (two) times daily. With full glass of water What changed:    how much to take  additional instructions   Ipratropium-Albuterol 20-100 MCG/ACT Aers respimat Commonly known as:  COMBIVENT Inhale 1 puff into the lungs 4 (four) times daily as needed for wheezing. What changed:  Another medication with the same name was changed. Make sure you understand how and when to take each.   ipratropium-albuterol 0.5-2.5 (3) MG/3ML Soln Commonly known as:  DUONEB Take 3 mLs by nebulization every 8 (eight) hours. What changed:  See the new instructions.   levalbuterol 1.25 MG/0.5ML nebulizer solution Commonly known as:  XOPENEX Take 1.25 mg by nebulization every 6 (six) hours as needed for wheezing or shortness of breath.   LORazepam 0.5 MG tablet Commonly known as:  ATIVAN Take 1-2 tablets (0.5-1 mg total) by mouth at bedtime as needed  for anxiety. Patient needs office visit before refills will be given What changed:  how much to take   metoprolol tartrate 25 MG tablet Commonly known as:  LOPRESSOR Take 1 tablet (25 mg total) by mouth 2 (two) times daily. What changed:    how much to take  when to take this   minoxidil 2 % external solution Commonly known as:  ROGAINE Apply 1 application topically as needed (for hair loss).   ONE-A-DAY 50 PLUS PO Take 1 tablet by mouth daily.   potassium chloride 10 MEQ tablet Commonly known as:  K-DUR,KLOR-CON Take 1 tablet (10 mEq total) by mouth daily. Start taking on:  10/24/2018   predniSONE 10 MG tablet Commonly known as:  DELTASONE Take 30 mg (3 tab) daily for 3 days Starting 11/24 take 20 mg (2 tab) daily for 4 days Starting 11/28 take 10 mg daily Start taking on:  10/24/2018   simvastatin 20 MG tablet Commonly known as:  ZOCOR TAKE 1 TABLET BY MOUTH  DAILY AT 6 PM. What changed:  See the new instructions.   traMADol 50 MG tablet Commonly known  as:  ULTRAM Take 1-2 tablets (50-100 mg total) by mouth every 4 (four) hours as needed for moderate pain.            Durable Medical Equipment  (From admission, onward)         Start     Ordered   10/23/18 0852  For home use only DME oxygen  Once    Question Answer Comment  Mode or (Route) Nasal cannula   Liters per Minute 6   Frequency Continuous (stationary and portable oxygen unit needed)   Oxygen conserving device No   Oxygen delivery system Gas      10/23/18 0851   10/22/18 0904  For home use only DME 3 n 1  Once     10/22/18 0865         Follow-up Information    [name], MD Follow up on 11/14/2018.   Specialty:  Cardiothoracic Surgery Why:  Appointment is at 3:00, Please get CXR at 2:30 at [facility] located on first floor of our office building Contact information: [address] [place] [address] [place]  Follow up.   Why:  home oxygen Contact information: [address] [place] [address] [phone]        Rigoberto Noel, MD Follow up on 10/28/2018.   Specialty:  Pulmonary Disease Why:  Appointment is at 1:30, please be there at 1:15 Contact information: [place] [address] [phone]        Erlene Quan, PA-C Follow up on 10/29/2018.   Specialties:  Cardiology, Radiology Why:  Appoointment is at 11:00 Contact information: [address] [place] [address] [phone]          Signed:  Original Note by Nicholes Rough PA-C  Addendum by:  Ellwood Handler PA-C 10/23/2018, 9:15 AM

## 2018-10-01 NOTE — Progress Notes (Addendum)
NAME:  Sarah Escobar, MRN:  [id number], DOB:  05/02/1956, LOS: 5 ADMISSION DATE:  09/26/2018, CONSULTATION DATE:  09/28/18 REFERRING MD:  Dr Nils Pyle, CHIEF COMPLAINT:  Pre CABG pulm  Evaluation f   HPI   Sarah Escobar -is being seen for preoperative pulmonary evaluation prior to bypass surgery.  She is known to have interstitial lung disease followed by Dr. Elsworth Soho.  She has a known UIP pattern.  She now presented with chest pain and has been set up for a bypass on Monday, September 22, 2018.  Dr. Darcey Nora has requested preoperative pulmonary clearance.  In terms of interstitial lung disease: She reports that she has been following in the pulmonary clinic for a few years.  In 2015 she had hypersensitivity pneumonitis panel and it was normal.  She had ANA and rheumatoid factor antibodies in 2015 and these were normal.  She reports that up until 6 months ago she was exercising on her stationary bike for 5 miles without any problems.  But then she stopped exercising because she was developing tiredness.he had a high-resolution CT scan of the chest March 2019 that I personally visualized.  The official report is that she has air-trapping slight upper lobe predominant reticulation.  Significant progression compared to March 2016,  3 years earlier.  There is some honeycombing in the left upper lobe.  Features were thought to be consistent with hypersensitivity pneumonitis chronic.  Then on March 04, 2018 she underwent bronchoscopy with lavage that showed polymorphs of 94% and a transbronchial biopsy that showed nonspecific inflammation but tissue was felt to be insufficient..    She has been on observation therapy.  She is not on chronic prednisone.  She has not been on any oxygen.  Never had any chest pain until this admission.Marland Kitchen  However she stopped exercising because she was having this tiredness 6 months ago and. did not notice any shortness of breath with activities of daily living except 3 weeks ago when she  walked 1 mile in [place] and she had to stop 3 times which is more than her husband.  She had never had this problem a year earlier.  Pulmonary function test done yesterday September 27, 2018 shows a 17% decline in [facility] compared to 4-1/2 years earlier which is compatible with a CT chest showing progression.  She is aware of a progressive interstitial lung disease.  Interstitial lung disease related questions: This was elicited without a formal questionnaire.  She denies any obvious mold or mildew exposure in the house.  She does not use any water fountains, CPAP, humidifier.  Denies any mildew exposure in the house.  Denies working in a [employer].  She works out of home.  Does not do any gardening.  No exposure to rotten wood or mulch.  No usage of feather pillows or blankets.  No pet hamsters or gerbils in the house.  No birds in the house.  She has never been exposed to amiodarone or chemotherapy or radiation therapy.  5 or 6 years ago she did take nitrofurantoin for a few weeks only.  Her grandmother died of some lung disease not otherwise specified.  She is a previous smoker 30 pack.  Quit 16 years ago.  In terms of connective tissue disease and collagen vascular disease: She denies this.  But on closer questioning she says that in the winter season her fingers might turn blue.  In terms of etiologic work-up May 2015 ANA and rheumatoid factor negative.  February 2017 ANA negative.  Anti-Jo 1 in May 2019- negative.  Blood allergy panel in August 2016- including IgE.  Other issue: She has chronic sinusitis.  2 weeks ago she did get antibiotics for an acute flareup.  She also took a few days of prednisone.   Hospital course: Since admission has not had any chest pain.  She is worried about pulmonary complications following bypass on account of interstitial lung disease.  Results for Sarah Escobar, Sarah Escobar (MRN [id number]) as of 09/28/2018 12:18  Ref. Range 04/07/2014 12:57 09/27/2018 10:23  FVC-Pre Latest  Units: L 2.54 2.10 (17% decline in 4.5 years)  FVC-%Pred-Pre Latest Units: % 75 60  FEV1-Pre Latest Units: L 2.27 1.92  FEV1-%Pred-Pre Latest Units: % 86 71  Pre FEV1/FVC ratio Latest Units: % 89 91  Results for Sarah Escobar, Sarah Escobar (MRN [id number]) as of 09/28/2018 12:18  Ref. Range 04/07/2014 12:57  DLCO unc Latest Units: ml/min/mmHg 18.56  DLCO unc % pred Latest Units: % 76   Past Medical History     has a past medical history of Allergy, Asthma, Depression, Hyperlipidemia, Hypertension, Osteopenia, Personal history of adenomatous colonic polyps/FHx colon cancer sister and father, and Sciatica of left side.   reports that she quit smoking about 16 years ago. Her smoking use included cigarettes. She has a 30.00 pack-year smoking history. She has never used smokeless tobacco.  Past Surgical History:  Procedure Laterality Date  . BUNIONECTOMY    . CERVICAL FUSION  1999   Dr Arnoldo Morale, [place]  . COLONOSCOPY  2013  . COLONOSCOPY W/ POLYPECTOMY  multiple   adenomas; [name]; last 2013  . discetomy lumbar  02/19/13   L1&2; Dr Arnoldo Morale, [place]  . LEFT HEART CATH AND CORONARY ANGIOGRAPHY N/A 09/26/2018   Procedure: LEFT HEART CATH AND CORONARY ANGIOGRAPHY;  Surgeon: Nelva Bush, MD;  Location: [facility];  Service: Cardiovascular;  Laterality: N/A;  . NASAL SINUS SURGERY  [phone]   X 3  . POLYPECTOMY  1217--2013   TA+  . TOTAL ABDOMINAL HYSTERECTOMY W/ BILATERAL SALPINGOOPHORECTOMY  2003   endometriosis   . VIDEO BRONCHOSCOPY Bilateral 03/04/2018   Procedure: VIDEO BRONCHOSCOPY WITH FLUORO;  Surgeon: Rigoberto Noel, MD;  Location: [facility];  Service: Cardiopulmonary;  Laterality: Bilateral;    Allergies  Allergen Reactions  . Flagyl [Metronidazole] Other (See Comments)    Nerve pain  . Levofloxacin Rash  . Loperamide Hcl Rash  . Neomycin-Bacitracin Zn-Polymyx Rash  . Ofloxacin Rash  . Sulfonamide Derivatives Hives  . Band-Aid Liquid Bandage [New Skin] Other (See  Comments)    Regular band-aid cause itching and redness  . Collodion Dermatitis  . Gabapentin Other (See Comments)    Mental status changes  . Latex Other (See Comments) and Dermatitis    "redness"  . Sulfa Antibiotics Hives  . Oxycontin [Oxycodone Hcl] Rash         Immunization History  Administered Date(s) Administered  . Influenza Whole 09/09/2008, 09/10/2009, 09/03/2012  . Influenza,inj,Quad PF,6+ Mos 08/25/2014, 09/03/2017, 09/17/2018  . Influenza-Unspecified 09/03/2013, 09/09/2015, 09/06/2016  . Pneumococcal Conjugate-13 08/25/2014  . Pneumococcal Polysaccharide-23 01/14/2007, 10/31/2017  . Tdap 10/29/2013    Family History  Problem Relation Age of Onset  . Colon cancer Father   . Heart attack Father 42  . Diabetes Father   . Stroke Father 78  . Colon cancer Sister   . Asthma Sister   . Asthma Mother   . Colitis Mother   . Cirrhosis Mother  non alcoholic, fatty liver  . Asthma Maternal Aunt   . Heart attack Sister 72  . Cirrhosis Brother        non alcoholic  . Colon polyps Brother   . Anemia Brother         Spherocytosis, hereditrary  . Asthma Maternal Grandmother   . Kidney failure Unknown        2 bro, 1 sister ? from HTN  . Prostate cancer Brother   . Breast cancer Neg Hx      Current Facility-Administered Medications:  .  0.45 % sodium chloride infusion, , Intravenous, Continuous PRN, Barrett, Erin R, PA-C, Last Rate: 10 mL/hr at 10/01/18 0400 .  0.9 %  sodium chloride infusion, 250 mL, Intravenous, Continuous, Barrett, Erin R, PA-C .  0.9 %  sodium chloride infusion, , Intravenous, Continuous, Barrett, Erin R, PA-C, Last Rate: 10 mL/hr at 09/30/18 1527 .  acetaminophen (TYLENOL) tablet 1,000 mg, 1,000 mg, Oral, Q6H, 1,000 mg at 10/01/18 1113 **OR** acetaminophen (TYLENOL) solution 1,000 mg, 1,000 mg, Per Tube, Q6H, Barrett, Erin R, PA-C .  albumin human 5 % solution 12.5 g, 250 mL, Intravenous, Q15 min PRN, Barrett, Erin R, PA-C, Last Rate: 250  mL/hr at 09/30/18 1750, 12.5 g at 09/30/18 1750 .  aspirin EC tablet 325 mg, 325 mg, Oral, Daily, 325 mg at 10/01/18 1010 **OR** aspirin chewable tablet 324 mg, 324 mg, Per Tube, Daily, Barrett, Erin R, PA-C .  atorvastatin (LIPITOR) tablet 80 mg, 80 mg, Oral, q1800, Barrett, Erin R, PA-C, 80 mg at 09/29/18 1741 .  bisacodyl (DULCOLAX) EC tablet 10 mg, 10 mg, Oral, Daily, 10 mg at 10/01/18 1011 **OR** bisacodyl (DULCOLAX) suppository 10 mg, 10 mg, Rectal, Daily, Barrett, Erin R, PA-C .  buPROPion (WELLBUTRIN XL) 24 hr tablet 300 mg, 300 mg, Oral, q morning - 10a, Barrett, Erin R, PA-C, 300 mg at 10/01/18 1010 .  cefUROXime (ZINACEF) 1.5 g in sodium chloride 0.9 % 100 mL IVPB, 1.5 g, Intravenous, Q12H, Barrett, Erin R, PA-C, Last Rate: 200 mL/hr at 10/01/18 0437, 1.5 g at 10/01/18 0437 .  Chlorhexidine Gluconate Cloth 2 % PADS 6 each, 6 each, Topical, Daily, [name], MD, 6 each at 09/30/18 1708 .  cholecalciferol (VITAMIN D) tablet 2,000 Units, 2,000 Units, Oral, Daily, Barrett, Erin R, PA-C, 2,000 Units at 10/01/18 1012 .  dexmedetomidine (PRECEDEX) 200 MCG/50ML (4 mcg/mL) infusion, 0-0.7 mcg/kg/hr, Intravenous, Continuous, Barrett, Erin R, PA-C, Last Rate: 1.6 mL/hr at 10/01/18 0400, 0.1 mcg/kg/hr at 10/01/18 0400 .  docusate sodium (COLACE) capsule 200 mg, 200 mg, Oral, Daily, Barrett, Erin R, PA-C, 200 mg at 10/01/18 1011 .  enoxaparin (LOVENOX) injection 30 mg, 30 mg, Subcutaneous, QHS, [name], MD .  finasteride (PROSCAR) tablet 2.5 mg, 2.5 mg, Oral, Daily, Barrett, Erin R, PA-C, 2.5 mg at 10/01/18 1012 .  fluticasone (FLONASE) 50 MCG/ACT nasal spray 2 spray, 2 spray, Each Nare, BID, Barrett, Erin R, PA-C, 2 spray at 10/01/18 1024 .  fluticasone furoate-vilanterol (BREO ELLIPTA) 100-25 MCG/INH 1 puff, 1 puff, Inhalation, Daily, Barrett, Erin R, PA-C, 1 puff at 10/01/18 0915 .  CBG monitoring, , , Q4H **AND** insulin aspart (novoLOG) injection 0-24 Units, 0-24 Units,  Subcutaneous, Q4H, Gerhardt, Edward B, MD .  ipratropium-albuterol (DUONEB) 0.5-2.5 (3) MG/3ML nebulizer solution 3 mL, 3 mL, Nebulization, Q6H PRN, Barrett, Erin R, PA-C .  ipratropium-albuterol (DUONEB) 0.5-2.5 (3) MG/3ML nebulizer solution 3 mL, 3 mL, Nebulization, Q6H, [name], MD, 3 mL  at 10/01/18 0915 .  ketorolac (TORADOL) 15 MG/ML injection 15 mg, 15 mg, Intravenous, Q6H PRN, [name], MD, 15 mg at 10/01/18 0923 .  lactated ringers infusion 500 mL, 500 mL, Intravenous, Once PRN, Barrett, Erin R, PA-C .  lactated ringers infusion, , Intravenous, Continuous, Barrett, Erin R, PA-C, Last Rate: 10 mL/hr at 10/01/18 0400 .  lactated ringers infusion, , Intravenous, Continuous, Barrett, Erin R, PA-C, Last Rate: 10 mL/hr at 09/30/18 1535 .  loratadine (CLARITIN) tablet 10 mg, 10 mg, Oral, Daily, Barrett, Erin R, PA-C, 10 mg at 10/01/18 1010 .  LORazepam (ATIVAN) tablet 0.5 mg, 0.5 mg, Oral, Q6H PRN, Barrett, Erin R, PA-C, 0.5 mg at 09/29/18 1944 .  MEDLINE mouth rinse, 15 mL, Mouth Rinse, Q4H, [name], MD, 15 mL at 10/01/18 1022 .  metoprolol tartrate (LOPRESSOR) tablet 12.5 mg, 12.5 mg, Oral, BID, 12.5 mg at 10/01/18 1010 **OR** metoprolol tartrate (LOPRESSOR) 25 mg/10 mL oral suspension 12.5 mg, 12.5 mg, Per Tube, BID, Barrett, Erin R, PA-C .  metoprolol tartrate (LOPRESSOR) injection 2.5-5 mg, 2.5-5 mg, Intravenous, Q2H PRN, Barrett, Erin R, PA-C .  milrinone (PRIMACOR) 20 MG/100 ML (0.2 mg/mL) infusion, 0.3 mcg/kg/min, Intravenous, Continuous, Lanelle Bal B, MD, Last Rate: 5.78 mL/hr at 10/01/18 0400, 0.3 mcg/kg/min at 10/01/18 0400 .  nitroGLYCERIN 50 mg in dextrose 5 % 250 mL (0.2 mg/mL) infusion, 0-100 mcg/min, Intravenous, Titrated, Barrett, Erin R, PA-C .  ondansetron (ZOFRAN) injection 4 mg, 4 mg, Intravenous, Q6H PRN, Barrett, Erin R, PA-C .  oxyCODONE (Oxy IR/ROXICODONE) immediate release tablet 5 mg, 5 mg, Oral, Q4H PRN, [name], MD, 5 mg at  10/01/18 1022 .  [START ON 10/02/2018] pantoprazole (PROTONIX) EC tablet 40 mg, 40 mg, Oral, Daily, Barrett, Erin R, PA-C .  phenylephrine (NEOSYNEPHRINE) 20-0.9 MG/250ML-% infusion, 0-100 mcg/min, Intravenous, Titrated, Barrett, Erin R, PA-C, Stopped at 10/01/18 0133 .  sodium chloride (OCEAN) 0.65 % nasal spray 2 spray, 2 spray, Each Nare, BID, Barrett, Erin R, PA-C, 2 spray at 10/01/18 1028 .  sodium chloride flush (NS) 0.9 % injection 10-40 mL, 10-40 mL, Intracatheter, Q12H, [name], MD, 10 mL at 10/01/18 1025 .  sodium chloride flush (NS) 0.9 % injection 10-40 mL, 10-40 mL, Intracatheter, PRN, [name], MD .  sodium chloride flush (NS) 0.9 % injection 3 mL, 3 mL, Intravenous, Q12H, Barrett, Erin R, PA-C, 3 mL at 10/01/18 1023 .  sodium chloride flush (NS) 0.9 % injection 3 mL, 3 mL, Intravenous, PRN, Barrett, Erin R, PA-C .  traMADol (ULTRAM) tablet 50-100 mg, 50-100 mg, Oral, Q4H PRN, Barrett, Erin R, PA-C, 100 mg at 10/01/18 [facility] Events   09/26/2018 0 admit 09/28/18 - CCM consult pre CABG  Consults: date of consult/date signed off & final recs:  09/28/18 - ccm consult  Procedures (surgical and bedside):  10/28:CABG x 3 10/28: Wedge resection of LUL with biopsy and cuture  Significant Diagnostic Tests:  HRCT Chest Spectrum of findings compatible with fibrotic interstitial lung disease with moderate air trapping. Chronic hypersensitivity pneumonitis is favored. Minimal progression of bronchiectasis since 02/14/2018 high-resolution chest CT study. Findings are otherwise stable. Findings are suggestive of an alternative diagnosis   Micro Data:  10/28: Sputum for AFB 10/28: Sputum for Culture ( Anaerobic and Aerobic) 10/28: Sputum for Culture  Antimicrobials:  Zinacef: 09/30/2018>> 09/30/2018: Vanc x 1 dose   SUBJECTIVE/OVERNIGHT/INTERVAL HX  10/29:CXR ( Viewed personally by me) shows basilar atelectasis. Was on VM this  am, but OOB  and aggressive pulmonary toilet nursing has weaned patient to 5 L States she is feeling better    Objective   Blood pressure (!) 83/58, pulse 81, temperature 98.1 F (36.7 C), temperature source Core, resp. rate 14, height _0  (1.676 m), weight 65.4 kg, SpO2 96 %. PAP: (19-45)/(9-28) 24/11 CO:  [3.8 L/min-5.1 L/min] 4.4 L/min CI:  [2.2 L/min/m2-3 L/min/m2] 2.5 L/min/m2  Vent Mode: PSV;CPAP FiO2 (%):  [36 %-55 %] 40 % Set Rate:  [4 bmp-12 bmp] 4 bmp Vt Set:  [470 mL] 470 mL PEEP:  [5 cmH20] 5 cmH20 Pressure Support:  [10 cmH20] 10 cmH20 Plateau Pressure:  [15 cmH20-21 cmH20] 15 cmH20   Intake/Output Summary (Last 24 hours) at 10/01/2018 1125 Last data filed at 10/01/2018 0600 Gross per 24 hour  Intake 4507.81 ml  Output 3341 ml  Net 1166.81 ml   Filed Weights   09/26/18 1600 10/01/18 0600  Weight: 64.2 kg 65.4 kg    Examination: General Appearance: OOB in chair wearing 5 L oxygen , sat is 96% HEENT: NCAT, No JVD, [place] Lungs: Bilateral bibasal crackles in  a craniocaudal gradient , Bilateral chest excursion, wearing 5 L [place] [employer] Heart:  S1 and S2 normal, no murmur, CVP - no.  Pressors - no Abdomen:  Soft, no masses, no organomegaly, NT, ND, BS quiet Genitalia / Rectal:  Not done Extremities:  Warm and dry, brisk refill, no obvious deformities Skin:  intact in exposed areas, warm and dry, no rash or lesions noted . Sacral area  No breakdown noted Neurologic:  Awake and alert and oriented x 3, MAE x 4, Appropriate   Comment Score   Thoracic, cabg 21   No  0   4.0 0   7 0   Fully functiona 0   ILD progresssive 6   Age 23 4    31    Moderate to moderate  High -10% chance      Resolved Hospital Problem list   10/28>> Extubation  Assessment & Plan:   #Preoperative pulmonary evaluation: Her FEV1 is less than 75% on account of the interstitial lung disease but she saturating 96% on room air and is quite functional and has intact nutritional status and renal function.   Therefore on the objective testing models above her risk for prolonged mechanical ventilation or reintubation following bypass surgery is less than 38% [in an 62 year old model set that did not include cardiac surgery].  In the more recent analysis the real risk is less than 3% [Gupta].  Similar risk profile for postoperative pneumonia as well.  However when he look at a composite of risk factors that include pneumonia and respiratory failure and atelectasis and effusion and oxygen dependence.  The risk for this is fairly high especially she got antibiotics and prednisone 2 weeks ago.  Nevertheless this is not prohibitive risk especially given the fact bypass is definitely indicated.  Intubation for CABG in previous smoker with ILD per HRCT Increased risk of post operative pulmonary complications History of dyspnea prior to admission Extubation post op without difficulty CXR with Atelectasis 10/29 Plan: Aggressive Pulmonary Toilet Flutter valve 4 time and hour while awake IS Q 1 while awake with increasing TV expectations OOB to chair PT/OT per TCTS BD  As ordered Wean oxygen for sats> 94% Follow Micro obtained 10/28  #Interstitial lung disease - unknown etiology.  Progressive on imaging. ?  chronic hypersensitivity pneumonitis,  But extensive directed history she is denying any mold  exposure or any organic antigen exposure.  Moreover the BAL neutrophilia in April 2019 does not fit in with hypersensitivity pneumonitis.  Previous autoimmune profile is negative.  Therefore surgical lung biopsy is indicated given the unclear reason for interstitial lung disease.  Plan -Repeat autoimmune and vasculitis panel -Obtained  surgical lung biopsy on the left side at 2 different locations at least if not 3 at the time of bypass surgery on Monday, September 30, 2018 (discussed with Dr. Darcey Nora who does not feel this poses additional risk to the patient].  Follow pathology for diagnosis confirmation Follow  up culture, AFB, Fungal cultures obtained 10/28 Follow up with Pulmonary as OP for treatment options with anti-fibrotic medications  Leukocytosis T max 99.1 Plan: Continue ABX as ordered Trend WBC and fever curve PCT if needed to guide antimicrobial therapy  Hyponatremia Plan: Minimize free water  #Chronic sinusitis -She feels this is chronic postnasal drip.  She does not feel this is an exacerbation. Plan: Continue nasal steroid and nasal saline spray as prophylaxis.  Disposition / Summary of Today's Plan 10/01/18   ccu     Diet: per cads Pain/Anxiety/Delirium protocol (if indicated): x VAP protocol (if indicated): x DVT prophylaxis: per cards GI prophylaxis: cards Hyperglycemia protocol: cards Mobility: cards Code Status: full Family Communication: Patient updated. No other family at bedside     [place], AGACNP-BC [place] Pager # [phone] After 3 pm [phone]  11:25 AM 10/01/2018

## 2018-10-01 NOTE — Op Note (Signed)
NAME: Sarah Escobar, Sarah Escobar DX:[id number] ACCOUNT [id number] DATE OF BIRTH:[birth year] FACILITY: [facility] LOCATION: [facility] PHYSICIAN:Macaiah Mangal Maryruth Bun, MD  OPERATIVE REPORT  DATE OF PROCEDURE:  09/30/2018  PREOPERATIVE DIAGNOSES:   1.  Coronary occlusive disease with recent non-STEMI myocardial infarction.   2.  Chronic lung disease.  POSTOPERATIVE DIAGNOSES:   1.  Coronary occlusive disease with recent non-STEMI myocardial infarction.   2.  Chronic lung disease.  SURGICAL PROCEDURE:  Coronary artery bypass grafting x3 with the left internal mammary to the left anterior descending coronary artery, reverse saphenous vein graft to the diagonal coronary artery, reverse saphenous vein graft to the distal right  coronary artery with bilateral lower leg open left lung biopsy, wedge resection.  SURGEON:  Lanelle Bal, MD  FIRST ASSISTANT:  Ellwood Handler, PA-C  BRIEF HISTORY:  The patient is a 62 year old female followed by Dr. Elsworth Soho for chronic lung disease, question bronchiectasis versus ILD, who presents with new onset of substernal chest pain radiating to both arms on 09/27/2018.  She was working from home  when this occurred initially at rest, and it resolved, but returned and she presented to the [facility].  Troponins were elevated.  The patient was diagnosed with non-STEMI myocardial infarction.  She underwent cardiac catheterization, which  revealed a 70% proximal right coronary artery stenosis with a large right dominant system and complex LAD diagonal disease.  The patient was initially seen by Dr. Caffie Pinto in consultation with  Dr. _____ and Dr. Ellyn Hack.  Coronary artery bypass grafting  was felt the most appropriate treatment due to the complexity of her LAD disease.  Overall, ejection fraction was approximately 45% with anterior hypokinesis.    In addition, the patient has been followed for chronic lung disease with CT findings consistent with bilateral lower lobe  bronchiectasis.  Repeat CT scan was done preoperatively.  The patient is not on home oxygen, but does have some limitations with  shortness of breath with exertion.  Pulmonary consult was requested prior to bypass surgery.  Risks and options of surgery were discussed with the patient and her husband in detail.  In addition, it was suggested to her that we would obtain a lung biopsy  as requested by the pulmonary service to rule out ILD.  The patient agreed and signed informed consent.  DESCRIPTION OF PROCEDURE:  With Swan-Ganz and arterial line monitors in place, the patient underwent general endotracheal anesthesia without incident.  Skin the chest and legs was prepped with Betadine, draped in the usual sterile manner.  Appropriate  timeout was performed.  We started with endoscopic vein harvesting.  A small incision was made at the right knee and the only vein that was evident at this site was extremely small.  We then went to the left leg and a similar incision was made and again  the vein appeared very small.  We then went at the ankle bilaterally.  There appeared to be adequate vein for bypass.  We then proceeded with an open technique of harvesting the greater saphenous vein from both lower extremities.  The vein in this  fashion was slightly small, but of good quality and was felt suitable for bypass.  Median sternotomy was performed.  Left internal mammary artery was dissected down as a pedicle graft.  The distal artery was divided and had good free flow.  Pericardium  was opened.  The patient did have some anterior hypokinesis.  TEE  intraoperatively showed 40% ejection fraction with anterior hypokinesis.  The patient was systemically heparinized.  The ascending aorta was cannulated.  The right atrium was cannulated.   An aortic root vent cardioplegia needle was introduced into the ascending aorta.  The patient was placed on cardiopulmonary bypass 2.4 liters per minute per meter square.  Sites and  anastomoses were inspected and dissected out of the epicardium.  The  distal right coronary artery was of good size and quality with some posterior wall plaque.  The LAD and second diagonal were both extremely small vessels 1 mm in size.  The patient's body temperature was cooled to 32 degrees.  Aortic crossclamp was  applied; 500 mL cold blood potassium cardioplegia was administered with diastolic arrest of the heart.  Myocardial septal temperatures were monitored throughout the crossclamp.  Attention was turned first to the distal right coronary artery, which was  opened; 1.5 mm probe passed distally without difficulty Using a running 7-0 Prolene, distal anastomosis was performed with the distal right coronary artery right at the takeoff of the posterior descending.  Additional cold blood cardioplegia was  administered down the vein graft.  The heart was then elevated and the second diagonal vessel, which was slightly larger than the first was opened, admitted 1 mm probe proximally and distally.  Using a running 8-0 Prolene a segment of reverse saphenous  vein graft was anastomosed to the diagonal coronary artery.  Additional cold blood cardioplegia was administered down the vein graft.  We then turned our attention to the distal LAD, which was also opened, admitted a 1 mm probe distally and proximally.   Using a running 8-0 Prolene, the left internal mammary artery was anastomosed to left anterior descending coronary artery.  With cross clamp still in place, 2 punch aortotomies were performed and each of the 2 vein grafts were anastomosed to the  ascending aorta.  With bulldog on the mammary artery was removed with prompt rise in myocardial septal temperature.  The heart was allowed to passively fill and deair and the proximal anastomoses were completed.  Aortic crossclamp was removed with total  crossclamp time of 84 minutes.  Sites of anastomoses were inspected and free of bleeding.  The patient was  then ventilated and rewarmed to 37 degrees.  Atrial and ventricular pacing wires were applied.  The patient was then weaned from cardiopulmonary  bypass without difficulty.  She remained hemodynamically stable.  She was decannulated in the usual fashion.  Protamine sulfate was administered.  With the operative field hemostatic,  atrial graft markers were applied.  A left pleural tube and a Blake  mediastinal drain were left in place.  The pericardium was loosely reapproximated.  Sternum was closed with #6 stainless steel wire.  Fascia closed with interrupted 0 Vicryl, running 3-0 Vicryl in subcutaneous tissue, 4-0 subcuticular stitch in skin  edges.  Dry dressings were applied.  Total pump time was 114 minutes.  Lung wedge biopsy portion of the, preoperatively, the left side had been marked with the Rultract still in place and after taking down the left internal mammary artery, a portion of the left upper lobe which was easily accessible which had normal  appearing lung and a portion of the lung that was atelectatic was selected and using a 40 mm gold-load stapler, a wedge resection of this portion of the lung was done.  A small portion was removed and submitted for cultures to microbiology.  The  remaining lung was sent to pathology.  At the completion of this, there was no air  leak.  At the completion of the procedure, the sponge and needle count was reported as correct.  Confirmation scan was confirmed for the tag sponges.  AN/NUANCE  D:10/01/2018 T:10/01/2018 JOB:[id number]

## 2018-10-01 NOTE — Progress Notes (Signed)
Patient ID: Sarah Escobar, female   DOB: 1956-06-30, 62 y.o.   MRN: [id number] TCTS Evening Rounds  Hemodynamically stable Milrinone off sats 97% on 3L HFNC Ambulating.

## 2018-10-02 ENCOUNTER — Inpatient Hospital Stay (HOSPITAL_COMMUNITY): Payer: 59

## 2018-10-02 LAB — GLUCOSE, CAPILLARY
Glucose-Capillary: 101 mg/dL — ABNORMAL HIGH (ref 70–99)
Glucose-Capillary: 106 mg/dL — ABNORMAL HIGH (ref 70–99)
Glucose-Capillary: 109 mg/dL — ABNORMAL HIGH (ref 70–99)
Glucose-Capillary: 111 mg/dL — ABNORMAL HIGH (ref 70–99)
Glucose-Capillary: 91 mg/dL (ref 70–99)
Glucose-Capillary: 93 mg/dL (ref 70–99)

## 2018-10-02 LAB — CBC
HCT: 27.2 % — ABNORMAL LOW (ref 36.0–46.0)
Hemoglobin: 9 g/dL — ABNORMAL LOW (ref 12.0–15.0)
MCH: 32.8 pg (ref 26.0–34.0)
MCHC: 33.1 g/dL (ref 30.0–36.0)
MCV: 99.3 fL (ref 80.0–100.0)
Platelets: 110 10*3/uL — ABNORMAL LOW (ref 150–400)
RBC: 2.74 MIL/uL — ABNORMAL LOW (ref 3.87–5.11)
RDW: 12.7 % (ref 11.5–15.5)
WBC: 12.5 10*3/uL — ABNORMAL HIGH (ref 4.0–10.5)
nRBC: 0 % (ref 0.0–0.2)

## 2018-10-02 LAB — BASIC METABOLIC PANEL
Anion gap: 5 (ref 5–15)
BUN: 14 mg/dL (ref 8–23)
CO2: 24 mmol/L (ref 22–32)
Calcium: 7.9 mg/dL — ABNORMAL LOW (ref 8.9–10.3)
Chloride: 104 mmol/L (ref 98–111)
Creatinine, Ser: 0.99 mg/dL (ref 0.44–1.00)
GFR calc Af Amer: >60 mL/min (ref >60)
GFR calc non Af Amer: 60 mL/min — ABNORMAL LOW (ref >60)
Glucose, Bld: 96 mg/dL (ref 70–99)
Potassium: 4 mmol/L (ref 3.5–5.1)
Sodium: 133 mmol/L — ABNORMAL LOW (ref 135–145)

## 2018-10-02 MED ORDER — AMIODARONE HCL IN DEXTROSE 360-4.14 MG/200ML-% IV SOLN
30.0000 mg/h | INTRAVENOUS | Status: DC
  Administered 2018-10-02 – 2018-10-03 (×3): 30 mg/h via INTRAVENOUS
  Filled 2018-10-02 (×3): qty 200

## 2018-10-02 MED ORDER — AMIODARONE LOAD VIA INFUSION
150.0000 mg | Freq: Once | INTRAVENOUS | Status: AC
  Administered 2018-10-02: 150 mg via INTRAVENOUS
  Filled 2018-10-02: qty 83.34

## 2018-10-02 MED ORDER — AMIODARONE HCL IN DEXTROSE 360-4.14 MG/200ML-% IV SOLN
INTRAVENOUS | Status: AC
  Administered 2018-10-02: 12:00:00
  Filled 2018-10-02: qty 200

## 2018-10-02 MED ORDER — FUROSEMIDE 10 MG/ML IJ SOLN
40.0000 mg | Freq: Once | INTRAMUSCULAR | Status: AC
  Administered 2018-10-02: 40 mg via INTRAVENOUS
  Filled 2018-10-02: qty 4

## 2018-10-02 MED ORDER — AMIODARONE IV BOLUS ONLY 150 MG/100ML: 150.0000 mg | Freq: Once | INTRAVENOUS | Status: DC

## 2018-10-02 MED ORDER — AMIODARONE HCL IN DEXTROSE 360-4.14 MG/200ML-% IV SOLN
60.0000 mg/h | INTRAVENOUS | Status: AC
  Administered 2018-10-02 (×2): 60 mg/h via INTRAVENOUS

## 2018-10-02 NOTE — Progress Notes (Signed)
  Amiodarone Drug - Drug Interaction Consult Note  Recommendations: Monitor vital signs - BP on soft side at times. No changes per current interactions - will continue to monitor for future interactions (of note, on simvastatin at home, currently on atorvastatin in-hospital).    Amiodarone is metabolized by the cytochrome P450 system and therefore has the potential to cause many drug interactions. Amiodarone has an average plasma half-life of 50 days (range 20 to 100 days).   There is potential for drug interactions to occur several weeks or months after stopping treatment and the onset of drug interactions may be slow after initiating amiodarone.   [x]  Statins: Increased risk of myopathy. Simvastatin- restrict dose to 20mg  daily. Other statins: counsel patients to report any muscle pain or weakness immediately.  []  Anticoagulants: Amiodarone can increase anticoagulant effect. Consider warfarin dose reduction. Patients should be monitored closely and the dose of anticoagulant altered accordingly, remembering that amiodarone levels take several weeks to stabilize.  []  Antiepileptics: Amiodarone can increase plasma concentration of phenytoin, the dose should be reduced. Note that small changes in phenytoin dose can result in large changes in levels. Monitor patient and counsel on signs of toxicity.  [x]  Beta blockers: increased risk of bradycardia, AV block and myocardial depression. Sotalol - avoid concomitant use.  []   Calcium channel blockers (diltiazem and verapamil): increased risk of bradycardia, AV block and myocardial depression.  []   Cyclosporine: Amiodarone increases levels of cyclosporine. Reduced dose of cyclosporine is recommended.  []  Digoxin dose should be halved when amiodarone is started.  []  Diuretics: increased risk of cardiotoxicity if hypokalemia occurs.  []  Oral hypoglycemic agents (glyburide, glipizide, glimepiride): increased risk of hypoglycemia. Patient's glucose  levels should be monitored closely when initiating amiodarone therapy.   []  Drugs that prolong the QT interval:  Torsades de pointes risk may be increased with concurrent use - avoid if possible.  Monitor QTc, also keep magnesium/potassium WNL if concurrent therapy can't be avoided. Marland Kitchen Antibiotics: e.g. fluoroquinolones, erythromycin. . Antiarrhythmics: e.g. quinidine, procainamide, disopyramide, sotalol. . Antipsychotics: e.g. phenothiazines, haloperidol.  . Lithium, tricyclic antidepressants, and methadone.  Thank You,  Betsey Holiday  10/02/2018 12:09 PM

## 2018-10-02 NOTE — Plan of Care (Signed)
Patient alert and oriented, assisted back to bed with minimal assistance.  With activity, patient had increased heart rate, and mild shortness of breath.  O2 sats dropped to 88 on 3 liters, increased to 4 liters and encouraged to cough and deep breathe.  Oxygen sats increased to 95%.  Patient's rhythm via monitor fluctuating between AFIB and sinus tach.  Remains on amiodarone gtt as charted.  Husband at bedside, supportive.  Will monitor as per protocol.

## 2018-10-02 NOTE — Anesthesia Postprocedure Evaluation (Signed)
Anesthesia Post Note  Patient: [place]  Procedure(s) Performed: CORONARY ARTERY BYPASS GRAFTING (CABG) X THREE, LIMA TO LAD, SVG TO DIAGONAL, SVG TO PDA  USING LEFT INTERNAL MAMMERY ARTERY AND RIGHT AND LEFT GREATER SAPHENOUS VEIN (N/A Chest) TRANSESOPHAGEAL ECHOCARDIOGRAM (TEE) (N/A ) LEFT UPPER LOBE WEDGE RESECTION (Left )     Patient location during evaluation: SICU Anesthesia Type: General Level of consciousness: awake and alert, oriented and patient cooperative Pain management: pain level controlled Vital Signs Assessment: post-procedure vital signs reviewed and stable Respiratory status: spontaneous breathing, nonlabored ventilation, respiratory function stable and patient connected to nasal cannula oxygen Cardiovascular status: blood pressure returned to baseline and stable (off of pressors) Postop Assessment: no apparent nausea or vomiting, adequate PO intake and able to ambulate Anesthetic complications: no    Last Vitals:  Vitals:   10/02/18 0600 10/02/18 0700  BP: (!) 90/55 98/70  Pulse: 89 94  Resp: 14 (!) 23  Temp:    SpO2: (!) 88% 92%    Last Pain:  Vitals:   10/02/18 0744  TempSrc:   PainSc: 3                  Kaeya Schiffer,E. Maegan Buller

## 2018-10-02 NOTE — Progress Notes (Signed)
Patient ID: Sarah Escobar, female   DOB: 16-Nov-1956, 62 y.o.   MRN: [id number] EVENING ROUNDS NOTE :     [address]       [place] [address]             [phone]                 2 Days Post-Op Procedure(s) (LRB): CORONARY ARTERY BYPASS GRAFTING (CABG) X THREE, LIMA TO LAD, SVG TO DIAGONAL, SVG TO PDA  USING LEFT INTERNAL MAMMERY ARTERY AND RIGHT AND LEFT GREATER SAPHENOUS VEIN (N/A) TRANSESOPHAGEAL ECHOCARDIOGRAM (TEE) (N/A) LEFT UPPER LOBE WEDGE RESECTION (Left)  Total Length of Stay:  LOS: 6 days  BP 100/73   Pulse 99   Temp 99.3 F (37.4 C) (Oral)   Resp (!) 37   Ht 5\' 6"  (1.676 m)   Wt 66.4 kg   SpO2 98%   BMI 23.63 kg/m   .Intake/Output      10/29 0701 - 10/30 0700 10/30 0701 - 10/31 0700   I.V. (mL/kg) 264 (4) 320.9 (4.8)   Blood     IV Piggyback 200    Total Intake(mL/kg) 464 (7) 320.9 (4.8)   Urine (mL/kg/hr) 1055 (0.7) 560 (0.7)   Blood     Chest Tube 240 0   Total Output 1295 560   Net -831 -239.1          . sodium chloride Stopped (10/01/18 0834)  . sodium chloride    . sodium chloride 10 mL/hr at 09/30/18 1527  . amiodarone 30 mg/hr (10/02/18 1747)  . amiodarone    . dexmedetomidine (PRECEDEX) IV infusion Stopped (10/01/18 0524)  . lactated ringers    . lactated ringers 10 mL/hr at 10/02/18 1800  . lactated ringers 10 mL/hr at 09/30/18 1535  . nitroGLYCERIN    . phenylephrine (NEO-SYNEPHRINE) Adult infusion Stopped (10/01/18 0133)     Lab Results  Component Value Date   WBC 12.5 (H) 10/02/2018   HGB 9.0 (L) 10/02/2018   HCT 27.2 (L) 10/02/2018   PLT 110 (L) 10/02/2018   GLUCOSE 96 10/02/2018   CHOL 120 09/27/2018   TRIG 68 09/27/2018   HDL 36 (L) 09/27/2018   LDLCALC 70 09/27/2018   ALT 40 09/26/2018   AST 46 (H) 09/26/2018   NA 133 (L) 10/02/2018   K 4.0 10/02/2018   CL 104 10/02/2018   CREATININE 0.99 10/02/2018   BUN 14 10/02/2018   CO2 24 10/02/2018   TSH 1.676 09/30/2018   INR 1.43 09/30/2018   HGBA1C  5.3 09/30/2018   Back in afib this evening, rebolus with Cordarone     [name] MD  Beeper [phone] Office [phone] 10/02/2018 6:58 PM

## 2018-10-02 NOTE — Progress Notes (Addendum)
Patient ID: Sarah Escobar, female   DOB: August 04, 1956, 62 y.o.   MRN: [id number] TCTS DAILY ICU PROGRESS NOTE                   [address]            [place] [address]          [phone]   2 Days Post-Op Procedure(s) (LRB): CORONARY ARTERY BYPASS GRAFTING (CABG) X THREE, LIMA TO LAD, SVG TO DIAGONAL, SVG TO PDA  USING LEFT INTERNAL MAMMERY ARTERY AND RIGHT AND LEFT GREATER SAPHENOUS VEIN (N/A) TRANSESOPHAGEAL ECHOCARDIOGRAM (TEE) (N/A) LEFT UPPER LOBE WEDGE RESECTION (Left)  Total Length of Stay:  LOS: 6 days   Subjective: Her pain is better controlled with Oxycodone, Tramadol, and Toradol  Objective: Vital signs in last 24 hours: Temp:  [97.7 F (36.5 C)-98.6 F (37 C)] 97.7 F (36.5 C) (10/30 0300) Pulse Rate:  [78-104] 89 (10/30 0600) Cardiac Rhythm: Normal sinus rhythm (10/30 0000) Resp:  [11-25] 14 (10/30 0600) BP: (83-112)/(53-80) 90/55 (10/30 0600) SpO2:  [88 %-99 %] 88 % (10/30 0600) Arterial Line BP: (100-127)/(48-50) 111/50 (10/29 0900) FiO2 (%):  [40 %-55 %] 40 % (10/29 0915) Weight:  [66.4 kg] 66.4 kg (10/30 0500)  Filed Weights   09/26/18 1600 10/01/18 0600 10/02/18 0500  Weight: 64.2 kg 65.4 kg 66.4 kg    Weight change: 1 kg   Hemodynamic parameters for last 24 hours: PAP: (24-26)/(11-15) 24/11  Intake/Output from previous day: 10/29 0701 - 10/30 0700 In: 464 [I.V.:264; IV Piggyback:200] Out: 1285 [Urine:1055; Chest Tube:230]  Intake/Output this shift: Total I/O In: 206.5 [I.V.:106.5; IV Piggyback:100] Out: 925 [Urine:885; Chest Tube:40]  Current Meds: Scheduled Meds: . acetaminophen  1,000 mg Oral Q6H   Or  . acetaminophen (TYLENOL) oral liquid 160 mg/5 mL  1,000 mg Per Tube Q6H  . aspirin EC  325 mg Oral Daily   Or  . aspirin  324 mg Per Tube Daily  . atorvastatin  80 mg Oral q1800  . bisacodyl  10 mg Oral Daily   Or  . bisacodyl  10 mg Rectal Daily  . buPROPion  300 mg Oral q morning - 10a  . Chlorhexidine  Gluconate Cloth  6 each Topical Daily  . cholecalciferol  2,000 Units Oral Daily  . docusate sodium  200 mg Oral Daily  . enoxaparin (LOVENOX) injection  30 mg Subcutaneous QHS  . finasteride  2.5 mg Oral Daily  . fluticasone  2 spray Each Nare BID  . fluticasone furoate-vilanterol  1 puff Inhalation Daily  . insulin aspart  0-24 Units Subcutaneous Q4H  . ipratropium-albuterol  3 mL Nebulization Q6H  . loratadine  10 mg Oral Daily  . mouth rinse  15 mL Mouth Rinse BID  . metoprolol tartrate  12.5 mg Oral BID   Or  . metoprolol tartrate  12.5 mg Per Tube BID  . pantoprazole  40 mg Oral Daily  . sodium chloride  2 spray Each Nare BID  . sodium chloride flush  10-40 mL Intracatheter Q12H  . sodium chloride flush  3 mL Intravenous Q12H   Continuous Infusions: . sodium chloride Stopped (10/01/18 0834)  . sodium chloride    . sodium chloride 10 mL/hr at 09/30/18 1527  . dexmedetomidine (PRECEDEX) IV infusion Stopped (10/01/18 0524)  . lactated ringers    . lactated ringers 10 mL/hr at 10/02/18 0600  . lactated ringers 10 mL/hr at 09/30/18 1535  . nitroGLYCERIN    .  phenylephrine (NEO-SYNEPHRINE) Adult infusion Stopped (10/01/18 0133)   PRN Meds:.sodium chloride, ipratropium-albuterol, ketorolac, lactated ringers, LORazepam, metoprolol tartrate, ondansetron (ZOFRAN) IV, oxyCODONE, sodium chloride flush, sodium chloride flush, traMADol  General appearance: alert, cooperative and no distress Heart: regular rate and rhythm, S1, S2 normal, no murmur, click, rub or gallop Lungs: clear to auscultation bilaterally and diminished in the lower lobes Abdomen: soft, non-tender; bowel sounds normal; no masses,  no organomegaly Extremities: extremities normal, atraumatic, no cyanosis or edema Wound: clean and dry dressed with a sterile dressing  Lab Results: CBC: Recent Labs    10/01/18 1647 10/01/18 1649 10/02/18 0440  WBC 14.1*  --  12.5*  HGB 9.4* 9.9* 9.0*  HCT 30.0* 29.0* 27.2*  PLT  122*  --  110*   BMET:  Recent Labs    10/01/18 0432  10/01/18 1649 10/02/18 0440  NA 134*  --  133* 133*  K 4.0  --  3.8 4.0  CL 108  --  101 104  CO2 21*  --   --  24  GLUCOSE 115*  --  112* 96  BUN 7*  --  12 14  CREATININE 0.78   < > 0.90 0.99  CALCIUM 7.6*  --   --  7.9*   < > = values in this interval not displayed.    CMET: Lab Results  Component Value Date   WBC 12.5 (H) 10/02/2018   HGB 9.0 (L) 10/02/2018   HCT 27.2 (L) 10/02/2018   PLT 110 (L) 10/02/2018   GLUCOSE 96 10/02/2018   CHOL 120 09/27/2018   TRIG 68 09/27/2018   HDL 36 (L) 09/27/2018   LDLCALC 70 09/27/2018   ALT 40 09/26/2018   AST 46 (H) 09/26/2018   NA 133 (L) 10/02/2018   K 4.0 10/02/2018   CL 104 10/02/2018   CREATININE 0.99 10/02/2018   BUN 14 10/02/2018   CO2 24 10/02/2018   TSH 1.676 09/30/2018   INR 1.43 09/30/2018   HGBA1C 5.3 09/30/2018      PT/INR:  Recent Labs    09/30/18 1437  LABPROT 17.3*  INR 1.43   Radiology: No results found.   Assessment/Plan: S/P Procedure(s) (LRB): CORONARY ARTERY BYPASS GRAFTING (CABG) X THREE, LIMA TO LAD, SVG TO DIAGONAL, SVG TO PDA  USING LEFT INTERNAL MAMMERY ARTERY AND RIGHT AND LEFT GREATER SAPHENOUS VEIN (N/A) TRANSESOPHAGEAL ECHOCARDIOGRAM (TEE) (N/A) LEFT UPPER LOBE WEDGE RESECTION (Left)  1. CV-NSR in the 80s, BP remains marginal, MAPs in the 60s-70s 2. Pulm-CXR appears stable this morning. She still has  bibasilar opacities consistent with atelectasis superimposed over chronic fibrotic change. 3. Renal-creatinine 0.99, electrolytes okay, Lasix 40mg  IV x 1 4. H and H stable, 9.0/27.2 expected acute blood loss anemia 5. Endo-blood glucose well controlled.   6. Continue lovenox for DVT prophylaxis  Plan: Continue diuresis. Discontinue pleural chest tubes and foley catheter. Ambulate in the halls today. Continued to encourage use of flutter valve and incentive spirometer.   Nicholes Rough, PA-C  [name] 10/02/2018 6:59 AM       Addendum, episode of af with rvr when getting up to walk, converted on iv amnio but would be poor long term drug considering her pulmonary function.   Cardiology asked to return to see patient . I have seen and examined Sarah Escobar and agree with the above assessment  and plan.  [name] MD Beeper [phone] Office [phone] 10/02/2018 2:57 PM

## 2018-10-03 ENCOUNTER — Inpatient Hospital Stay (HOSPITAL_COMMUNITY): Payer: 59

## 2018-10-03 ENCOUNTER — Inpatient Hospital Stay: Payer: Self-pay

## 2018-10-03 DIAGNOSIS — I10 Essential (primary) hypertension: Secondary | ICD-10-CM

## 2018-10-03 DIAGNOSIS — I480 Paroxysmal atrial fibrillation: Secondary | ICD-10-CM

## 2018-10-03 DIAGNOSIS — J849 Interstitial pulmonary disease, unspecified: Secondary | ICD-10-CM

## 2018-10-03 LAB — BPAM RBC
Blood Product Expiration Date: 201911292359
Blood Product Expiration Date: 201911292359
Blood Product Expiration Date: 201911302359
Blood Product Expiration Date: 201911302359
ISSUE DATE / TIME: 201910281104
ISSUE DATE / TIME: 201910281104
Unit Type and Rh: 5100
Unit Type and Rh: 5100
Unit Type and Rh: 5100
Unit Type and Rh: 5100

## 2018-10-03 LAB — TYPE AND SCREEN
ABO/RH(D): O POS
Antibody Screen: NEGATIVE
Unit division: 0
Unit division: 0
Unit division: 0
Unit division: 0

## 2018-10-03 LAB — GLUCOSE, CAPILLARY
Glucose-Capillary: 98 mg/dL (ref 70–99)
Glucose-Capillary: 91 mg/dL (ref 70–99)
Glucose-Capillary: 91 mg/dL (ref 70–99)
Glucose-Capillary: 95 mg/dL (ref 70–99)
Glucose-Capillary: 104 mg/dL — ABNORMAL HIGH (ref 70–99)

## 2018-10-03 LAB — BASIC METABOLIC PANEL
Anion gap: 4 — ABNORMAL LOW (ref 5–15)
BUN: 14 mg/dL (ref 8–23)
CO2: 25 mmol/L (ref 22–32)
Calcium: 8.1 mg/dL — ABNORMAL LOW (ref 8.9–10.3)
Chloride: 101 mmol/L (ref 98–111)
Creatinine, Ser: 0.91 mg/dL (ref 0.44–1.00)
GFR calc Af Amer: >60 mL/min (ref >60)
GFR calc non Af Amer: >60 mL/min (ref >60)
Glucose, Bld: 116 mg/dL — ABNORMAL HIGH (ref 70–99)
Potassium: 3.6 mmol/L (ref 3.5–5.1)
Sodium: 130 mmol/L — ABNORMAL LOW (ref 135–145)

## 2018-10-03 LAB — CBC
HCT: 27.8 % — ABNORMAL LOW (ref 36.0–46.0)
Hemoglobin: 9.1 g/dL — ABNORMAL LOW (ref 12.0–15.0)
MCH: 32.3 pg (ref 26.0–34.0)
MCHC: 32.7 g/dL (ref 30.0–36.0)
MCV: 98.6 fL (ref 80.0–100.0)
Platelets: 128 10*3/uL — ABNORMAL LOW (ref 150–400)
RBC: 2.82 MIL/uL — ABNORMAL LOW (ref 3.87–5.11)
RDW: 12.6 % (ref 11.5–15.5)
WBC: 13.6 10*3/uL — ABNORMAL HIGH (ref 4.0–10.5)
nRBC: 0 % (ref 0.0–0.2)

## 2018-10-03 MED ORDER — CHLORHEXIDINE GLUCONATE CLOTH 2 % EX PADS: 6.0000 | MEDICATED_PAD | Freq: Every day | CUTANEOUS | Status: DC

## 2018-10-03 MED ORDER — DIGOXIN 125 MCG PO TABS: 0.1875 mg | ORAL_TABLET | Freq: Every day | ORAL | Status: DC

## 2018-10-03 MED ORDER — GUAIFENESIN ER 600 MG PO TB12
600.0000 mg | ORAL_TABLET | Freq: Two times a day (BID) | ORAL | Status: DC
  Administered 2018-10-03 – 2018-10-05 (×5): 600 mg via ORAL
  Filled 2018-10-03 (×5): qty 1

## 2018-10-03 MED ORDER — FUROSEMIDE 10 MG/ML IJ SOLN
INTRAMUSCULAR | Status: AC
  Filled 2018-10-03: qty 4

## 2018-10-03 MED ORDER — POTASSIUM CHLORIDE 10 MEQ/50ML IV SOLN
10.0000 meq | INTRAVENOUS | Status: AC | PRN
  Administered 2018-10-03 (×3): 10 meq via INTRAVENOUS
  Filled 2018-10-03 (×3): qty 50

## 2018-10-03 MED ORDER — DIGOXIN 0.25 MG/ML IJ SOLN
0.2500 mg | Freq: Once | INTRAMUSCULAR | Status: AC
  Administered 2018-10-03: 0.25 mg via INTRAVENOUS
  Filled 2018-10-03: qty 2

## 2018-10-03 MED ORDER — DIGOXIN 0.25 MG/ML IJ SOLN
0.1250 mg | Freq: Three times a day (TID) | INTRAMUSCULAR | Status: AC
  Administered 2018-10-03 – 2018-10-04 (×2): 0.125 mg via INTRAVENOUS
  Filled 2018-10-03 (×2): qty 2

## 2018-10-03 MED ORDER — SODIUM CHLORIDE 0.9% FLUSH
10.0000 mL | INTRAVENOUS | Status: DC | PRN
  Administered 2018-10-08: 10 mL
  Administered 2018-10-19: 20 mL
  Administered 2018-10-20 – 2018-10-22 (×2): 10 mL
  Filled 2018-10-03 (×4): qty 40

## 2018-10-03 MED ORDER — FUROSEMIDE 10 MG/ML IJ SOLN
40.0000 mg | Freq: Once | INTRAMUSCULAR | Status: AC
  Administered 2018-10-03: 40 mg via INTRAVENOUS
  Filled 2018-10-03: qty 4

## 2018-10-03 MED ORDER — SODIUM CHLORIDE 0.9 % IV SOLN
2.0000 g | INTRAVENOUS | Status: DC
  Administered 2018-10-03 – 2018-10-11 (×9): 2 g via INTRAVENOUS
  Filled 2018-10-03 (×10): qty 20

## 2018-10-03 MED ORDER — SODIUM CHLORIDE 0.9% FLUSH
10.0000 mL | Freq: Two times a day (BID) | INTRAVENOUS | Status: DC
  Administered 2018-10-03 – 2018-10-05 (×6): 10 mL
  Administered 2018-10-06: 40 mL
  Administered 2018-10-07 (×2): 10 mL
  Administered 2018-10-08: 20 mL
  Administered 2018-10-08 – 2018-10-09 (×3): 10 mL

## 2018-10-03 NOTE — Progress Notes (Signed)
      [address]       [place] [address]             [phone]      POD # 3 CABG, left upper lobe wedge  BP 96/83   Pulse (!) 106   Temp 98.3 F (36.8 C) (Oral)   Resp (!) 23   Ht 5\' 6"  (1.676 m)   Wt 66.4 kg   SpO2 96%   BMI 23.63 kg/m    Intake/Output Summary (Last 24 hours) at 10/03/2018 1836 Last data filed at 10/03/2018 1530 Gross per 24 hour  Intake 849.09 ml  Output 400 ml  Net 449.09 ml  CBG well controlled, no PM labs  Stable day  Remo Lipps C. Roxan Hockey, MD [facility] [phone]

## 2018-10-03 NOTE — Progress Notes (Signed)
NAME:  Sarah Escobar, MRN:  [id number], DOB:  [birth year], LOS: 7 ADMISSION DATE:  09/26/2018, CONSULTATION DATE:  09/28/18 REFERRING MD:  Dr Nils Pyle, CHIEF COMPLAINT:  Pre CABG pulm  Evaluation f   HPI   Sarah Escobar -is being seen for preoperative pulmonary evaluation prior to bypass surgery.  She is known to have interstitial lung disease followed by Dr. Elsworth Soho.  She has a known UIP pattern.  She now presented with chest pain and has been set up for a bypass on Monday, September 22, 2018.  Dr. Darcey Nora has requested preoperative pulmonary clearance.  In terms of interstitial lung disease: She reports that she has been following in the pulmonary clinic for a few years.  In 2015 she had hypersensitivity pneumonitis panel and it was normal.  She had ANA and rheumatoid factor antibodies in 2015 and these were normal.  She reports that up until 6 months ago she was exercising on her stationary bike for 5 miles without any problems.  But then she stopped exercising because she was developing tiredness.he had a high-resolution CT scan of the chest March 2019 that I personally visualized.  The official report is that she has air-trapping slight upper lobe predominant reticulation.  Significant progression compared to March 2016,  3 years earlier.  There is some honeycombing in the left upper lobe.  Features were thought to be consistent with hypersensitivity pneumonitis chronic.  Then on March 04, 2018 she underwent bronchoscopy with lavage that showed polymorphs of 94% and a transbronchial biopsy that showed nonspecific inflammation but tissue was felt to be insufficient..    She has been on observation therapy.  She is not on chronic prednisone.  She has not been on any oxygen.  Never had any chest pain until this admission.Marland Kitchen  However she stopped exercising because she was having this tiredness 6 months ago and. did not notice any shortness of breath with activities of daily living except 3 weeks ago when she  walked 1 mile in [place] and she had to stop 3 times which is more than her husband.  She had never had this problem a year earlier.  Pulmonary function test done yesterday September 27, 2018 shows a 17% decline in [facility] compared to 4-1/2 years earlier which is compatible with a CT chest showing progression.  She is aware of a progressive interstitial lung disease.  Interstitial lung disease related questions: This was elicited without a formal questionnaire.  She denies any obvious mold or mildew exposure in the house.  She does not use any water fountains, CPAP, humidifier.  Denies any mildew exposure in the house.  Denies working in a [employer].  She works out of home.  Does not do any gardening.  No exposure to rotten wood or mulch.  No usage of feather pillows or blankets.  No pet hamsters or gerbils in the house.  No birds in the house.  She has never been exposed to amiodarone or chemotherapy or radiation therapy.  5 or 6 years ago she did take nitrofurantoin for a few weeks only.  Her grandmother died of some lung disease not otherwise specified.  She is a previous smoker 30 pack.  Quit 16 years ago.  In terms of connective tissue disease and collagen vascular disease: She denies this.  But on closer questioning she says that in the winter season her fingers might turn blue.  In terms of etiologic work-up May 2015 ANA and rheumatoid factor negative.  February 2017 ANA negative.  Anti-Jo 1 in May 2019- negative.  Blood allergy panel in August 2016- including IgE.  Other issue: She has chronic sinusitis.  2 weeks ago she did get antibiotics for an acute flareup.  She also took a few days of prednisone.   Hospital course: Since admission has not had any chest pain.  She is worried about pulmonary complications following bypass on account of interstitial lung disease.  Results for Sarah, Escobar (MRN [id number]) as of 09/28/2018 12:18  Ref. Range 04/07/2014 12:57 09/27/2018 10:23  FVC-Pre Latest  Units: L 2.54 2.10 (17% decline in 4.5 years)  FVC-%Pred-Pre Latest Units: % 75 60  FEV1-Pre Latest Units: L 2.27 1.92  FEV1-%Pred-Pre Latest Units: % 86 71  Pre FEV1/FVC ratio Latest Units: % 89 91  Results for Sarah, Escobar (MRN [id number]) as of 09/28/2018 12:18  Ref. Range 04/07/2014 12:57  DLCO unc Latest Units: ml/min/mmHg 18.56  DLCO unc % pred Latest Units: % 76   Past Medical History     has a past medical history of Allergy, Asthma, Depression, Hyperlipidemia, Hypertension, Osteopenia, Personal history of adenomatous colonic polyps/FHx colon cancer sister and father, and Sciatica of left side.   reports that she quit smoking about 16 years ago. Her smoking use included cigarettes. She has a 30.00 pack-year smoking history. She has never used smokeless tobacco.  Past Surgical History:  Procedure Laterality Date  . BUNIONECTOMY    . CERVICAL FUSION  1999   Dr Arnoldo Morale, [place]  . COLONOSCOPY  2013  . COLONOSCOPY W/ POLYPECTOMY  multiple   adenomas; [name]; last 2013  . CORONARY ARTERY BYPASS GRAFT N/A 09/30/2018   Procedure: CORONARY ARTERY BYPASS GRAFTING (CABG) X THREE, LIMA TO LAD, SVG TO DIAGONAL, SVG TO PDA  USING LEFT INTERNAL MAMMERY ARTERY AND RIGHT AND LEFT GREATER SAPHENOUS VEIN;  Surgeon: [name], MD;  Location: [place];  Service: Open Heart Surgery;  Laterality: N/A;  . discetomy lumbar  02/19/13   L1&2; Dr Arnoldo Morale, [place]  . LEFT HEART CATH AND CORONARY ANGIOGRAPHY N/A 09/26/2018   Procedure: LEFT HEART CATH AND CORONARY ANGIOGRAPHY;  Surgeon: Nelva Bush, MD;  Location: [facility];  Service: Cardiovascular;  Laterality: N/A;  . NASAL SINUS SURGERY  [phone]   X 3  . POLYPECTOMY  1217--2013   TA+  . TEE WITHOUT CARDIOVERSION N/A 09/30/2018   Procedure: TRANSESOPHAGEAL ECHOCARDIOGRAM (TEE);  Surgeon: [name], MD;  Location: [place];  Service: Open Heart Surgery;  Laterality: N/A;  . TOTAL ABDOMINAL HYSTERECTOMY W/ BILATERAL  SALPINGOOPHORECTOMY  2003   endometriosis   . VIDEO BRONCHOSCOPY Bilateral 03/04/2018   Procedure: VIDEO BRONCHOSCOPY WITH FLUORO;  Surgeon: Rigoberto Noel, MD;  Location: [facility];  Service: Cardiopulmonary;  Laterality: Bilateral;  . WEDGE RESECTION Left 09/30/2018   Procedure: LEFT UPPER LOBE WEDGE RESECTION;  Surgeon: [name], MD;  Location: [place];  Service: Open Heart Surgery;  Laterality: Left;    Allergies  Allergen Reactions  . Flagyl [Metronidazole] Other (See Comments)    Nerve pain  . Levofloxacin Rash  . Loperamide Hcl Rash  . Neomycin-Bacitracin Zn-Polymyx Rash  . Ofloxacin Rash  . Sulfonamide Derivatives Hives  . Band-Aid Liquid Bandage [New Skin] Other (See Comments)    Regular band-aid cause itching and redness  . Collodion Dermatitis  . Gabapentin Other (See Comments)    Mental status changes  . Latex Other (See Comments) and Dermatitis    "redness"  .  Sulfa Antibiotics Hives  . Oxycontin [Oxycodone Hcl] Rash         Immunization History  Administered Date(s) Administered  . Influenza Whole 09/09/2008, 09/10/2009, 09/03/2012  . Influenza,inj,Quad PF,6+ Mos 08/25/2014, 09/03/2017, 09/17/2018  . Influenza-Unspecified 09/03/2013, 09/09/2015, 09/06/2016  . Pneumococcal Conjugate-13 08/25/2014  . Pneumococcal Polysaccharide-23 01/14/2007, 10/31/2017  . Tdap 10/29/2013    Family History  Problem Relation Age of Onset  . Colon cancer Father   . Heart attack Father 18  . Diabetes Father   . Stroke Father 64  . Colon cancer Sister   . Asthma Sister   . Asthma Mother   . Colitis Mother   . Cirrhosis Mother        non alcoholic, fatty liver  . Asthma Maternal Aunt   . Heart attack Sister 92  . Cirrhosis Brother        non alcoholic  . Colon polyps Brother   . Anemia Brother         Spherocytosis, hereditrary  . Asthma Maternal Grandmother   . Kidney failure Unknown        2 bro, 1 sister ? from HTN  . Prostate cancer Brother   . Breast  cancer Neg Hx      Current Facility-Administered Medications:  .  0.45 % sodium chloride infusion, , Intravenous, Continuous PRN, Barrett, Erin R, PA-C, Stopped at 10/01/18 0834 .  0.9 %  sodium chloride infusion, 250 mL, Intravenous, Continuous, Barrett, Erin R, PA-C .  0.9 %  sodium chloride infusion, , Intravenous, Continuous, Barrett, Erin R, PA-C, Last Rate: 10 mL/hr at 09/30/18 1527 .  acetaminophen (TYLENOL) tablet 1,000 mg, 1,000 mg, Oral, Q6H, 1,000 mg at 10/03/18 0514 **OR** acetaminophen (TYLENOL) solution 1,000 mg, 1,000 mg, Per Tube, Q6H, Barrett, Erin R, PA-C .  aspirin EC tablet 325 mg, 325 mg, Oral, Daily, 325 mg at 10/03/18 0923 **OR** aspirin chewable tablet 324 mg, 324 mg, Per Tube, Daily, Barrett, Erin R, PA-C .  atorvastatin (LIPITOR) tablet 80 mg, 80 mg, Oral, q1800, Barrett, Erin R, PA-C, 80 mg at 10/02/18 1746 .  bisacodyl (DULCOLAX) EC tablet 10 mg, 10 mg, Oral, Daily, 10 mg at 10/03/18 0920 **OR** bisacodyl (DULCOLAX) suppository 10 mg, 10 mg, Rectal, Daily, Barrett, Erin R, PA-C .  buPROPion (WELLBUTRIN XL) 24 hr tablet 300 mg, 300 mg, Oral, q morning - 10a, Barrett, Erin R, PA-C, 300 mg at 10/03/18 0923 .  Chlorhexidine Gluconate Cloth 2 % PADS 6 each, 6 each, Topical, Daily, [name], MD, 6 each at 10/02/18 1745 .  cholecalciferol (VITAMIN D) tablet 2,000 Units, 2,000 Units, Oral, Daily, Barrett, Erin R, PA-C, 2,000 Units at 10/03/18 0921 .  dexmedetomidine (PRECEDEX) 200 MCG/50ML (4 mcg/mL) infusion, 0-0.7 mcg/kg/hr, Intravenous, Continuous, Barrett, Erin R, PA-C, Stopped at 10/01/18 0524 .  digoxin (LANOXIN) 0.25 MG/ML injection 0.125 mg, 0.125 mg, Intravenous, Q8H, Skeet Latch, MD .  Derrill Memo ON 10/05/2018] digoxin (LANOXIN) tablet 0.1875 mg, 0.1875 mg, Oral, Daily, Skeet Latch, MD .  docusate sodium (COLACE) capsule 200 mg, 200 mg, Oral, Daily, Barrett, Erin R, PA-C, 200 mg at 10/03/18 0923 .  enoxaparin (LOVENOX) injection 30 mg, 30 mg,  Subcutaneous, QHS, [name], MD, 30 mg at 10/02/18 2113 .  finasteride (PROSCAR) tablet 2.5 mg, 2.5 mg, Oral, Daily, Barrett, Erin R, PA-C, 2.5 mg at 10/03/18 0931 .  fluticasone (FLONASE) 50 MCG/ACT nasal spray 2 spray, 2 spray, Each Nare, BID, Barrett, Erin R, PA-C, 2 spray at 10/03/18 0924 .  fluticasone furoate-vilanterol (BREO ELLIPTA) 100-25 MCG/INH 1 puff, 1 puff, Inhalation, Daily, Barrett, Erin R, PA-C, 1 puff at 10/03/18 0735 .  guaiFENesin (MUCINEX) 12 hr tablet 600 mg, 600 mg, Oral, BID, Conte, Tessa N, PA-C, 600 mg at 10/03/18 7989 .  CBG monitoring, , , Q4H **AND** insulin aspart (novoLOG) injection 0-24 Units, 0-24 Units, Subcutaneous, Q4H, Gerhardt, Edward B, MD .  ipratropium-albuterol (DUONEB) 0.5-2.5 (3) MG/3ML nebulizer solution 3 mL, 3 mL, Nebulization, Q6H PRN, Barrett, Erin R, PA-C .  ipratropium-albuterol (DUONEB) 0.5-2.5 (3) MG/3ML nebulizer solution 3 mL, 3 mL, Nebulization, Q6H, [name], MD, 3 mL at 10/03/18 0735 .  ketorolac (TORADOL) 15 MG/ML injection 15 mg, 15 mg, Intravenous, Q6H PRN, [name], MD, 15 mg at 10/03/18 0936 .  lactated ringers infusion 500 mL, 500 mL, Intravenous, Once PRN, Barrett, Erin R, PA-C .  lactated ringers infusion, , Intravenous, Continuous, Barrett, Erin R, PA-C, Last Rate: 10 mL/hr at 10/03/18 0200 .  lactated ringers infusion, , Intravenous, Continuous, Barrett, Erin R, PA-C, Last Rate: 10 mL/hr at 09/30/18 1535 .  loratadine (CLARITIN) tablet 10 mg, 10 mg, Oral, Daily, Barrett, Erin R, PA-C, 10 mg at 10/03/18 0923 .  LORazepam (ATIVAN) tablet 0.5 mg, 0.5 mg, Oral, Q6H PRN, Barrett, Erin R, PA-C, 0.5 mg at 10/02/18 2120 .  MEDLINE mouth rinse, 15 mL, Mouth Rinse, BID, [name], MD, 15 mL at 10/01/18 2107 .  metoprolol tartrate (LOPRESSOR) tablet 12.5 mg, 12.5 mg, Oral, BID, 12.5 mg at 10/03/18 0923 **OR** metoprolol tartrate (LOPRESSOR) 25 mg/10 mL oral suspension 12.5 mg, 12.5 mg, Per Tube, BID,  Barrett, Erin R, PA-C .  metoprolol tartrate (LOPRESSOR) injection 2.5-5 mg, 2.5-5 mg, Intravenous, Q2H PRN, Barrett, Erin R, PA-C, 5 mg at 10/02/18 1702 .  nitroGLYCERIN 50 mg in dextrose 5 % 250 mL (0.2 mg/mL) infusion, 0-100 mcg/min, Intravenous, Titrated, Barrett, Erin R, PA-C .  ondansetron (ZOFRAN) injection 4 mg, 4 mg, Intravenous, Q6H PRN, Barrett, Erin R, PA-C .  oxyCODONE (Oxy IR/ROXICODONE) immediate release tablet 5 mg, 5 mg, Oral, Q4H PRN, [name], MD, 5 mg at 10/02/18 1932 .  pantoprazole (PROTONIX) EC tablet 40 mg, 40 mg, Oral, Daily, Barrett, Erin R, PA-C, 40 mg at 10/03/18 0922 .  phenylephrine (NEOSYNEPHRINE) 20-0.9 MG/250ML-% infusion, 0-100 mcg/min, Intravenous, Titrated, Barrett, Erin R, PA-C, Stopped at 10/01/18 0133 .  sodium chloride (OCEAN) 0.65 % nasal spray 2 spray, 2 spray, Each Nare, BID, Barrett, Erin R, PA-C, 2 spray at 10/03/18 0924 .  sodium chloride flush (NS) 0.9 % injection 10-40 mL, 10-40 mL, Intracatheter, Q12H, [name], MD, 10 mL at 10/03/18 0937 .  sodium chloride flush (NS) 0.9 % injection 10-40 mL, 10-40 mL, Intracatheter, PRN, [name], MD .  sodium chloride flush (NS) 0.9 % injection 3 mL, 3 mL, Intravenous, Q12H, Barrett, Erin R, PA-C, 3 mL at 10/03/18 0923 .  sodium chloride flush (NS) 0.9 % injection 3 mL, 3 mL, Intravenous, PRN, Barrett, Erin R, PA-C .  traMADol (ULTRAM) tablet 50-100 mg, 50-100 mg, Oral, Q4H PRN, Barrett, Erin R, PA-C, 100 mg at 10/02/18 [facility] Events   09/26/2018 0 admit 09/28/18 - CCM consult pre CABG  Consults: date of consult/date signed off & final recs:  09/28/18 - ccm consult  Procedures (surgical and bedside):  10/28:CABG x 3 10/28: Wedge resection of LUL with biopsy and cuture  Significant Diagnostic Tests:  HRCT Chest Spectrum of findings compatible with  fibrotic interstitial lung disease with moderate air trapping. Chronic hypersensitivity pneumonitis is  favored. Minimal progression of bronchiectasis since 02/14/2018 high-resolution chest CT study. Findings are otherwise stable. Findings are suggestive of an alternative diagnosis   Micro Data:  10/28: Sputum for AFB>> 10/28: Sputum for Culture ( Anaerobic and Aerobic)>> 10/28: Sputum for Culture??   Surgical pathology>>  Antimicrobials:  Zinacef: 09/30/2018>> 09/30/2018: Vanc x 1 dose   SUBJECTIVE/OVERNIGHT/INTERVAL HX  Awake alert no acute distress. Encouraged to deep breathe and cough    Objective   Blood pressure 112/66, pulse (!) 101, temperature 98.8 F (37.1 C), temperature source Oral, resp. rate (!) 27, height _0  (1.676 m), weight 66.4 kg, SpO2 93 %.    FiO2 (%):  [32 %] 32 %   Intake/Output Summary (Last 24 hours) at 10/03/2018 1045 Last data filed at 10/03/2018 0200 Gross per 24 hour  Intake 747.95 ml  Output 500 ml  Net 247.95 ml   Filed Weights   09/26/18 1600 10/01/18 0600 10/02/18 0500  Weight: 64.2 kg 65.4 kg 66.4 kg    General: Frail elderly female who is in no acute distress at rest HEENT: JVD lymphadenopathy is appreciated Neuro: Intact CV: In sinus rhythm frequent PVCs with episodes of paroxysmal atrial fibrillation, sternal incision unremarkable PULM: Decreased breath sounds throughout, poor pulmonary excursion MW:NUUV, non-tender, bsx4 active  Extremities: warm/dry, 1+ edema bilateral lower extremity dressings are intact Skin: no rashes or lesions     Resolved Hospital Problem list   10/28>> Extubation  Assessment & Plan:   #Preoperative pulmonary evaluation: Her FEV1 is less than 75% on account of the interstitial lung disease but she saturating 96% on room air and is quite functional and has intact nutritional status and renal function.  Therefore on the objective testing models above her risk for prolonged mechanical ventilation or reintubation following bypass surgery is less than 103% [in an 62 year old model set that did not  include cardiac surgery].  In the more recent analysis the real risk is less than 3% [Gupta].  Similar risk profile for postoperative pneumonia as well.  However when he look at a composite of risk factors that include pneumonia and respiratory failure and atelectasis and effusion and oxygen dependence.  The risk for this is fairly high especially she got antibiotics and prednisone 2 weeks ago.  Nevertheless this is not prohibitive risk especially given the fact bypass is definitely indicated.  Intubation for CABG in previous smoker with ILD per HRCT Increased risk of post operative pulmonary complications History of dyspnea prior to admission Extubation post op without difficulty CXR with Atelectasis 10/29 Plan: Pulmonary toilet Mobilize Bronchodilators as needed Pain control to encourage deep breaths and coughing   #Interstitial lung disease - unknown etiology.  Progressive on imaging. ?  chronic hypersensitivity pneumonitis,  But extensive directed history she is denying any mold exposure or any organic antigen exposure.  Moreover the BAL neutrophilia in April 2019 does not fit in with hypersensitivity pneumonitis.  Previous autoimmune profile is negative.  Therefore surgical lung biopsy is indicated given the unclear reason for interstitial lung disease.  Plan Await surgical biopsy results currently not available 10/03/2018 Follow-up with outpatient with pulmonary O2 as needed Pulmonary toilet Leukocytosis  Plan: Follow culture data Currently on antimicrobial therapy  Hyponatremia Recent Labs  Lab 10/01/18 1649 10/02/18 0440 10/03/18 0312  NA 133* 133* 130*     Plan: Minimize free water  #Chronic sinusitis -She feels this is chronic postnasal drip.  She does  not feel this is an exacerbation. Plan: Continue nasal hygiene  Disposition / Summary of Today's Plan 10/03/18   Remains in the cardiovascular intensive care unit Being treated with amiodarone for A. fib this  is being transitioned to digoxin Remains on 4 L nasal cannula encouraged pulmonary toilet  10/03/2018 chest x-ray reviewed personally by me left chest tube is been removed there is no pneumothorax chronic fibrotic changes in the lung right lung base atelectasis   Diet: Heart healthy Pain/Anxiety/Delirium protocol (if indicated): Incisional pain relief VAP protocol (if indicated): x DVT prophylaxis: per cards GI prophylaxis: GI Hyperglycemia protocol: Scale insulin Mobility: Bed Code Status: full Family Communication: 10/03/2018 patient and husband updated bedside     Adonis Housekeeper Margalit Leece ACNP Maryanna Shape PCCM Pager [phone] till 1 pm If no answer page [phone] 10/03/2018, 10:45 AM

## 2018-10-03 NOTE — Progress Notes (Signed)
Patient ambulated with assist of one.  Sats dropped as low as 85 on 5 liters [place].  Stopped several times to catch breath.  Ambulated 190 ft only.  Tolerated fair.  After sitting in chair for short period of time, sats came back up to 94.  Encouraged to use ISP, taking deep breaths and has productive cough of tan sputum noted.

## 2018-10-03 NOTE — Progress Notes (Signed)
Peripherally Inserted Central Catheter/Midline Placement  The IV Nurse has discussed with the patient and/or persons authorized to consent for the patient, the purpose of this procedure and the potential benefits and risks involved with this procedure.  The benefits include less needle sticks, lab draws from the catheter, and the patient may be discharged home with the catheter. Risks include, but not limited to, infection, bleeding, blood clot (thrombus formation), and puncture of an artery; nerve damage and irregular heartbeat and possibility to perform a PICC exchange if needed/ordered by physician.  Alternatives to this procedure were also discussed.  Bard Power PICC patient education guide, fact sheet on infection prevention and patient information card has been provided to patient /or left at bedside.    PICC/Midline Placement Documentation  PICC Double Lumen 10/03/18 PICC Right Brachial 37 cm 0 cm (Active)  Indication for Insertion or Continuance of Line Prolonged intravenous therapies 10/03/2018  2:45 PM  Exposed Catheter (cm) 0 cm 10/03/2018  2:45 PM  Site Assessment Clean;Dry;Intact 10/03/2018  2:45 PM  Lumen #1 Status Flushed;Saline locked;Blood return noted 10/03/2018  2:45 PM  Lumen #2 Status Flushed;Saline locked;Blood return noted 10/03/2018  2:45 PM  Dressing Type Transparent 10/03/2018  2:45 PM  Dressing Status Clean;Dry;Intact;Antimicrobial disc in place 10/03/2018  2:45 PM  Dressing Change Due 10/10/18 10/03/2018  2:45 PM       Gordan Payment 10/03/2018, 2:46 PM

## 2018-10-03 NOTE — Progress Notes (Addendum)
[facility] DAILY ICU PROGRESS NOTE                   [address]            [place], [id number]          [phone]   3 Days Post-Op Procedure(s) (LRB): CORONARY ARTERY BYPASS GRAFTING (CABG) X THREE, LIMA TO LAD, SVG TO DIAGONAL, SVG TO PDA  USING LEFT INTERNAL MAMMERY ARTERY AND RIGHT AND LEFT GREATER SAPHENOUS VEIN (N/A) TRANSESOPHAGEAL ECHOCARDIOGRAM (TEE) (N/A) LEFT UPPER LOBE WEDGE RESECTION (Left)  Total Length of Stay:  LOS: 7 days   Subjective: Increasing sob   Objective: Vital signs in last 24 hours: Temp:  [97.9 F (36.6 C)-99.3 F (37.4 C)] 98.8 F (37.1 C) (10/31 0000) Pulse Rate:  [83-115] 97 (10/31 0735) Cardiac Rhythm: Atrial fibrillation;Sinus tachycardia (10/30 2000) Resp:  [14-37] 20 (10/31 0735) BP: (87-119)/(33-81) 107/81 (10/31 0735) SpO2:  [80 %-100 %] 95 % (10/31 0735) FiO2 (%):  [32 %] 32 % (10/30 1425)  Filed Weights   09/26/18 1600 10/01/18 0600 10/02/18 0500  Weight: 64.2 kg 65.4 kg 66.4 kg    Weight change:       Intake/Output from previous day: 10/30 0701 - 10/31 0700 In: 748 [I.V.:748] Out: 560 [Urine:560]  Intake/Output this shift: No intake/output data recorded.  Current Meds: Scheduled Meds: . acetaminophen  1,000 mg Oral Q6H   Or  . acetaminophen (TYLENOL) oral liquid 160 mg/5 mL  1,000 mg Per Tube Q6H  . aspirin EC  325 mg Oral Daily   Or  . aspirin  324 mg Per Tube Daily  . atorvastatin  80 mg Oral q1800  . bisacodyl  10 mg Oral Daily   Or  . bisacodyl  10 mg Rectal Daily  . buPROPion  300 mg Oral q morning - 10a  . Chlorhexidine Gluconate Cloth  6 each Topical Daily  . cholecalciferol  2,000 Units Oral Daily  . docusate sodium  200 mg Oral Daily  . enoxaparin (LOVENOX) injection  30 mg Subcutaneous QHS  . finasteride  2.5 mg Oral Daily  . fluticasone  2 spray Each Nare BID  . fluticasone furoate-vilanterol  1 puff Inhalation Daily  . furosemide  40 mg Intravenous Once  . guaiFENesin  600 mg Oral BID   . insulin aspart  0-24 Units Subcutaneous Q4H  . ipratropium-albuterol  3 mL Nebulization Q6H  . loratadine  10 mg Oral Daily  . mouth rinse  15 mL Mouth Rinse BID  . metoprolol tartrate  12.5 mg Oral BID   Or  . metoprolol tartrate  12.5 mg Per Tube BID  . pantoprazole  40 mg Oral Daily  . sodium chloride  2 spray Each Nare BID  . sodium chloride flush  10-40 mL Intracatheter Q12H  . sodium chloride flush  3 mL Intravenous Q12H   Continuous Infusions: . sodium chloride Stopped (10/01/18 0834)  . sodium chloride    . sodium chloride 10 mL/hr at 09/30/18 1527  . amiodarone 30 mg/hr (10/03/18 0718)  . dexmedetomidine (PRECEDEX) IV infusion Stopped (10/01/18 0524)  . lactated ringers    . lactated ringers 10 mL/hr at 10/03/18 0200  . lactated ringers 10 mL/hr at 09/30/18 1535  . nitroGLYCERIN    . phenylephrine (NEO-SYNEPHRINE) Adult infusion Stopped (10/01/18 0133)  . potassium chloride 10 mEq (10/03/18 0642)   PRN Meds:.sodium chloride, ipratropium-albuterol, ketorolac, lactated ringers, LORazepam, metoprolol tartrate, ondansetron (ZOFRAN) IV, oxyCODONE, potassium  chloride, sodium chloride flush, sodium chloride flush, traMADol  General appearance: alert, cooperative and no distress Heart: irregularly irregular rhythm Lungs: clear to auscultation bilaterally and diminished in the lower lobes Abdomen: soft, non-tender; bowel sounds normal; no masses,  no organomegaly Extremities: extremities normal, atraumatic, no cyanosis or edema Wound: clean and dry covered with a sterile dressing  Lab Results: CBC: Recent Labs    10/01/18 1647 10/01/18 1649 10/02/18 0440  WBC 14.1*  --  12.5*  HGB 9.4* 9.9* 9.0*  HCT 30.0* 29.0* 27.2*  PLT 122*  --  110*   BMET:  Recent Labs    10/02/18 0440 10/03/18 0312  NA 133* 130*  K 4.0 3.6  CL 104 101  CO2 24 25  GLUCOSE 96 116*  BUN 14 14  CREATININE 0.99 0.91  CALCIUM 7.9* 8.1*    CMET: Lab Results  Component Value Date    WBC 12.5 (H) 10/02/2018   HGB 9.0 (L) 10/02/2018   HCT 27.2 (L) 10/02/2018   PLT 110 (L) 10/02/2018   GLUCOSE 116 (H) 10/03/2018   CHOL 120 09/27/2018   TRIG 68 09/27/2018   HDL 36 (L) 09/27/2018   LDLCALC 70 09/27/2018   ALT 40 09/26/2018   AST 46 (H) 09/26/2018   NA 130 (L) 10/03/2018   K 3.6 10/03/2018   CL 101 10/03/2018   CREATININE 0.91 10/03/2018   BUN 14 10/03/2018   CO2 25 10/03/2018   TSH 1.676 09/30/2018   INR 1.43 09/30/2018   HGBA1C 5.3 09/30/2018      PT/INR:  Recent Labs    09/30/18 1437  LABPROT 17.3*  INR 1.43   Radiology: [place] Ekg Site Rite  Result Date: 10/03/2018 If Site Rite image not attached, placement could not be confirmed due to current cardiac rhythm.    Assessment/Plan: S/P Procedure(s) (LRB): CORONARY ARTERY BYPASS GRAFTING (CABG) X THREE, LIMA TO LAD, SVG TO DIAGONAL, SVG TO PDA  USING LEFT INTERNAL MAMMERY ARTERY AND RIGHT AND LEFT GREATER SAPHENOUS VEIN (N/A) TRANSESOPHAGEAL ECHOCARDIOGRAM (TEE) (N/A) LEFT UPPER LOBE WEDGE RESECTION (Left)  1. CV-in and out of atrial fibrillation, MAPs in the 70s-80s. On Amio. Cardiology consulted yesterday. Continue ASA and statin.  2. Pulm-no new CXR.  She still has  bibasilar opacities consistent with atelectasis superimposed over chronic fibrotic change. Continues to require 4L [place] for oxygen support. She desats when she is walking down to the 80s.  3. Renal-creatinine 0.91, electrolytes okay, Lasix 40mg  IV x 1 4. H and H stable, 9.0/27.2 expected acute blood loss anemia-no new CBC today 5. Endo-blood glucose well controlled.   6. Continue lovenox for DVT prophylaxis  Plan: Continue nebs and pulmonary toilet. Start mucinex for thick secretions. Await chest xray this morning. Ambulate as tolerated. Cardiology to see today.   Cardiology did not see yesterday will repeat request to follow today With underlying pulmonary disease increasing o2 requirements will ask  Labs pending this am       Sarah Escobar 10/03/2018 8:01 AM

## 2018-10-03 NOTE — Progress Notes (Signed)
Progress Note  Patient Name: Sarah Escobar Date of Encounter: 10/03/2018  Primary Cardiologist: Dorris Carnes, MD   Subjective   Feeling tired and weak.  She does report palpitations and shortness of breath.  Incisional pain is well-controlled.  Inpatient Medications    Scheduled Meds: . acetaminophen  1,000 mg Oral Q6H   Or  . acetaminophen (TYLENOL) oral liquid 160 mg/5 mL  1,000 mg Per Tube Q6H  . aspirin EC  325 mg Oral Daily   Or  . aspirin  324 mg Per Tube Daily  . atorvastatin  80 mg Oral q1800  . bisacodyl  10 mg Oral Daily   Or  . bisacodyl  10 mg Rectal Daily  . buPROPion  300 mg Oral q morning - 10a  . Chlorhexidine Gluconate Cloth  6 each Topical Daily  . cholecalciferol  2,000 Units Oral Daily  . docusate sodium  200 mg Oral Daily  . enoxaparin (LOVENOX) injection  30 mg Subcutaneous QHS  . finasteride  2.5 mg Oral Daily  . fluticasone  2 spray Each Nare BID  . fluticasone furoate-vilanterol  1 puff Inhalation Daily  . furosemide  40 mg Intravenous Once  . guaiFENesin  600 mg Oral BID  . insulin aspart  0-24 Units Subcutaneous Q4H  . ipratropium-albuterol  3 mL Nebulization Q6H  . loratadine  10 mg Oral Daily  . mouth rinse  15 mL Mouth Rinse BID  . metoprolol tartrate  12.5 mg Oral BID   Or  . metoprolol tartrate  12.5 mg Per Tube BID  . pantoprazole  40 mg Oral Daily  . sodium chloride  2 spray Each Nare BID  . sodium chloride flush  10-40 mL Intracatheter Q12H  . sodium chloride flush  3 mL Intravenous Q12H   Continuous Infusions: . sodium chloride Stopped (10/01/18 0834)  . sodium chloride    . sodium chloride 10 mL/hr at 09/30/18 1527  . amiodarone 30 mg/hr (10/03/18 0718)  . dexmedetomidine (PRECEDEX) IV infusion Stopped (10/01/18 0524)  . lactated ringers    . lactated ringers 10 mL/hr at 10/03/18 0200  . lactated ringers 10 mL/hr at 09/30/18 1535  . nitroGLYCERIN    . phenylephrine (NEO-SYNEPHRINE) Adult infusion Stopped (10/01/18 0133)    . potassium chloride 10 mEq (10/03/18 0804)   PRN Meds: sodium chloride, ipratropium-albuterol, ketorolac, lactated ringers, LORazepam, metoprolol tartrate, ondansetron (ZOFRAN) IV, oxyCODONE, potassium chloride, sodium chloride flush, sodium chloride flush, traMADol   Vital Signs    Vitals:   10/03/18 0236 10/03/18 0500 10/03/18 0600 10/03/18 0735  BP:  113/74 114/76 107/81  Pulse:  94 99 97  Resp:  19 18 20   Temp:      TempSrc:      SpO2: 93% 95% 93% 95%  Weight:      Height:        Intake/Output Summary (Last 24 hours) at 10/03/2018 0811 Last data filed at 10/03/2018 0200 Gross per 24 hour  Intake 747.95 ml  Output 500 ml  Net 247.95 ml   Filed Weights   09/26/18 1600 10/01/18 0600 10/02/18 0500  Weight: 64.2 kg 65.4 kg 66.4 kg    Telemetry    Mostly sinus tachycardia with frequent PACs.  Also episodes of atrial fibrillation.  Rate ranging from the 80s to the 130s.- Personally Reviewed  ECG    Sinus rhythm.  Rate 84 bpm.  QTc 470 ms.  - Personally Reviewed  Physical Exam   VS:  BP 112/66  Pulse (!) 101   Temp 98.8 F (37.1 C) (Oral)   Resp (!) 27   Ht 5\' 6"  (1.676 m)   Wt 66.4 kg   SpO2 93%   BMI 23.63 kg/m  , BMI Body mass index is 23.63 kg/m. GENERAL:  Ill-appearing HEENT: Pupils equal round and reactive, fundi not visualized, oral mucosa unremarkable NECK:  No jugular venous distention, waveform within normal limits, carotid upstroke brisk and symmetric, no bruits LUNGS: Bibasilar crackles CHEST: Midline sternotomy with surgical dressing intact. HEART: Irregularly irregular.  PMI not displaced or sustained,S1 and S2 within normal limits, no S3, no S4, no clicks, no rubs, no murmurs ABD:  Flat, positive bowel sounds normal in frequency in pitch, no bruits, no rebound, no guarding, no midline pulsatile mass, no hepatomegaly, no splenomegaly EXT:  2 plus pulses throughout, no edema, no cyanosis no clubbing.  Vein graft dressings intact. SKIN:  No  rashes no nodules NEURO:  Cranial nerves II through XII grossly intact, motor grossly intact throughout [facility]:  Cognitively intact, oriented to person place and time    Labs    Chemistry Recent Labs  Lab 09/26/18 1530  10/01/18 0432 10/01/18 1647 10/01/18 1649 10/02/18 0440 10/03/18 0312  NA 133*   < > 134*  --  133* 133* 130*  K 3.7   < > 4.0  --  3.8 4.0 3.6  CL 100   < > 108  --  101 104 101  CO2 26   < > 21*  --   --  24 25  GLUCOSE 90   < > 115*  --  112* 96 116*  BUN 7*   < > 7*  --  12 14 14   CREATININE 0.81   < > 0.78 0.91 0.90 0.99 0.91  CALCIUM 9.1   < > 7.6*  --   --  7.9* 8.1*  PROT 7.0  --   --   --   --   --   --   ALBUMIN 4.0  --   --   --   --   --   --   AST 46*  --   --   --   --   --   --   ALT 40  --   --   --   --   --   --   ALKPHOS 108  --   --   --   --   --   --   BILITOT 0.4  --   --   --   --   --   --   GFRNONAA >60   < > >60 >60  --  60* >60  GFRAA >60   < > >60 >60  --  >60 >60  ANIONGAP 7   < > 5  --   --  5 4*   < > = values in this interval not displayed.     Hematology Recent Labs  Lab 10/01/18 0432 10/01/18 1647 10/01/18 1649 10/02/18 0440  WBC 13.7* 14.1*  --  12.5*  RBC 2.93* 2.99*  --  2.74*  HGB 9.4* 9.4* 9.9* 9.0*  HCT 28.8* 30.0* 29.0* 27.2*  MCV 98.3 100.3*  --  99.3  MCH 32.1 31.4  --  32.8  MCHC 32.6 31.3  --  33.1  RDW 12.7 12.8  --  12.7  PLT 121* 122*  --  110*    Cardiac Enzymes Recent Labs  Lab 09/26/18 1939  09/27/18 0058 09/27/18 0638  TROPONINI 6.55* 9.80* 4.25*    Recent Labs  Lab 09/26/18 1539  TROPIPOC 0.26*     BNPNo results for input(s): BNP, PROBNP in the last 168 hours.   DDimer  Recent Labs  Lab 09/26/18 1530  DDIMER 0.86*     Radiology    Dg Chest Port 1 View  Result Date: 10/02/2018 CLINICAL DATA:  Status post cardiac surgery EXAM: PORTABLE CHEST 1 VIEW COMPARISON:  10/01/2018 FINDINGS: Cardiac shadow is enlarged in size but stable. Postoperative changes are again seen.  Mediastinal drain and Swan-Ganz catheter have been removed in the interval. Left thoracostomy catheter is again seen and stable. No pneumothorax is noted. Patchy bibasilar opacities are again seen and stable. No new focal abnormality is noted. IMPRESSION: Interval removal of tubes and lines as described. No new focal abnormality is noted. Electronically Signed   By: Inez Catalina M.D.   On: 10/02/2018 10:38   [place] Ekg Site Rite  Result Date: 10/03/2018 If Site Rite image not attached, placement could not be confirmed due to current cardiac rhythm.   Cardiac Studies   Echo 09/27/18: Study Conclusions  - Left ventricle: The cavity size was normal. There was mild focal   basal hypertrophy of the septum. Systolic function was normal.   The estimated ejection fraction was in the range of 55% to 60%.   There is hypokinesis of the mid septal myocardium. Doppler   parameters are consistent with abnormal left ventricular   relaxation (grade 1 diastolic dysfunction).  Impressions:  - Septal hypokinesis with overall preserved LV systolic function;   mild diastolic dysfunction; sclerotic aortic valve.  LHC 09/26/18: Conclusions: 1. Diffuse, heavy calcification of the coronary arteries with significant 2-vessel CAD.  There is an 80% ostial/proximal LAD stenosis as well as an eccentric 70% mid RCA lesion. 2. Normal left ventricular systolic function and filling pressure.  Recommendations: 1. Images reviewed with Dr. Ellyn Hack.  We both agree that given heavy calcification and ostial disease of the LAD would be best suited for bypass.  PCI would be high risk, requiring atherectomy and stenting back into the distal LMCA.  Cardiac surgery has been contacted for consultation. 2. Restart heparin infusion 2 hours after TR band removal. 3. Metoprolol and IV nitroglycerin for antianginal therapy. 4. Aggressive secondary prevention. 5. Obtain transthoracic echocardiogram.  Patient Profile     62 y.o.  female with hypertension, hyperlipidemia, ILD, bronchiectasis,   Assessment & Plan    # NSTEMI: # 2 vessel CAD:  Patient underwent three-vessel CABG with Dr. Servando Snare on 10/01/2018.  She had a LIMA to the LAD, SVG to the diagonal, and SVG to distal RCA.   Continue aspirin, metoprolol and atorvastatin.  Preoperative LVEF is 55 to 60%.  She is mildly volume overloaded.  Agree with 1 dose of Lasix today.  # Post-operative atrial fibrillation: This morning Ms. Monica is mostly in sinus tachycardia with frequent PACs.  She continues to have paroxysms of atrial fibrillation.  She was started on IV amiodarone but given her ILD, agree that this is not a great long term solution.  Thus far she is only received IV amiodarone.  Prior to starting any other antiarrhythmics she would require a washout period.  Would recommend holding amiodarone at least 72 hours, at which time she could start sotalol with careful monitoring of the QTC.  Most recent QT was 470 ms on 10/29.  We will stop amiodarone today and use digoxin for rate control.  Continue metoprolol.  She has a history of hemoptysis. Lung biopsy pending.  No anticoagulation for now.  Would consider anticoagulation prior to discharge.   This patients CHA2DS2-VASc Score and unadjusted Ischemic Stroke Rate (% per year) is equal to 3.2 % stroke rate/year from a score of 3  Above score calculated as 1 point each if present [CHF, HTN, DM, Vascular=MI/PAD/Aortic Plaque, Age if 65-74, or Female] Above score calculated as 2 points each if present [Age > 75, or Stroke/TIA/TE]   Time spent: 40 minutes-Greater than 50% of this time was spent in counseling, explanation of diagnosis, planning of further management, and coordination of care.      For questions or updates, please contact [place] Please consult [email or web address] for contact info under        Signed, Skeet Latch, MD  10/03/2018, 8:11 AM

## 2018-10-04 ENCOUNTER — Inpatient Hospital Stay (HOSPITAL_COMMUNITY): Payer: 59

## 2018-10-04 DIAGNOSIS — I4891 Unspecified atrial fibrillation: Secondary | ICD-10-CM

## 2018-10-04 DIAGNOSIS — J189 Pneumonia, unspecified organism: Secondary | ICD-10-CM

## 2018-10-04 LAB — BASIC METABOLIC PANEL
Anion gap: 5 (ref 5–15)
BUN: 15 mg/dL (ref 8–23)
CO2: 25 mmol/L (ref 22–32)
Calcium: 8 mg/dL — ABNORMAL LOW (ref 8.9–10.3)
Chloride: 99 mmol/L (ref 98–111)
Creatinine, Ser: 0.86 mg/dL (ref 0.44–1.00)
GFR calc Af Amer: >60 mL/min (ref >60)
GFR calc non Af Amer: >60 mL/min (ref >60)
Glucose, Bld: 97 mg/dL (ref 70–99)
Potassium: 3.9 mmol/L (ref 3.5–5.1)
Sodium: 129 mmol/L — ABNORMAL LOW (ref 135–145)

## 2018-10-04 LAB — GLUCOSE, CAPILLARY
Glucose-Capillary: 91 mg/dL (ref 70–99)
Glucose-Capillary: 98 mg/dL (ref 70–99)
Glucose-Capillary: 163 mg/dL — ABNORMAL HIGH (ref 70–99)
Glucose-Capillary: 80 mg/dL (ref 70–99)
Glucose-Capillary: 113 mg/dL — ABNORMAL HIGH (ref 70–99)
Glucose-Capillary: 113 mg/dL — ABNORMAL HIGH (ref 70–99)

## 2018-10-04 LAB — EXPECTORATED SPUTUM ASSESSMENT W REFEX TO RESP CULTURE

## 2018-10-04 LAB — CBC
HCT: 25.9 % — ABNORMAL LOW (ref 36.0–46.0)
Hemoglobin: 8.7 g/dL — ABNORMAL LOW (ref 12.0–15.0)
MCH: 32.8 pg (ref 26.0–34.0)
MCHC: 33.6 g/dL (ref 30.0–36.0)
MCV: 97.7 fL (ref 80.0–100.0)
Platelets: 158 10*3/uL (ref 150–400)
RBC: 2.65 MIL/uL — ABNORMAL LOW (ref 3.87–5.11)
RDW: 12.6 % (ref 11.5–15.5)
WBC: 11.9 10*3/uL — ABNORMAL HIGH (ref 4.0–10.5)
nRBC: 0 % (ref 0.0–0.2)

## 2018-10-04 LAB — MAGNESIUM: MAGNESIUM: 1.8 mg/dL (ref 1.7–2.4)

## 2018-10-04 LAB — PHOSPHORUS: Phosphorus: 3 mg/dL (ref 2.5–4.6)

## 2018-10-04 LAB — EXPECTORATED SPUTUM ASSESSMENT W GRAM STAIN, RFLX TO RESP C

## 2018-10-04 MED ORDER — POTASSIUM CHLORIDE CRYS ER 20 MEQ PO TBCR
20.0000 meq | EXTENDED_RELEASE_TABLET | Freq: Once | ORAL | Status: AC
  Administered 2018-10-04: 20 meq via ORAL
  Filled 2018-10-04: qty 1

## 2018-10-04 MED ORDER — AMIODARONE LOAD VIA INFUSION
150.0000 mg | Freq: Once | INTRAVENOUS | Status: AC
  Administered 2018-10-04: 150 mg via INTRAVENOUS
  Filled 2018-10-04: qty 83.34

## 2018-10-04 MED ORDER — AMIODARONE HCL IN DEXTROSE 360-4.14 MG/200ML-% IV SOLN: 60.0000 mg/h | INTRAVENOUS | Status: DC

## 2018-10-04 MED ORDER — LEVALBUTEROL HCL 1.25 MG/0.5ML IN NEBU
1.2500 mg | INHALATION_SOLUTION | Freq: Four times a day (QID) | RESPIRATORY_TRACT | Status: DC
  Administered 2018-10-04 – 2018-10-05 (×6): 1.25 mg via RESPIRATORY_TRACT
  Filled 2018-10-04 (×6): qty 0.5

## 2018-10-04 MED ORDER — DIGOXIN 125 MCG PO TABS: 0.1875 mg | ORAL_TABLET | Freq: Every day | ORAL | Status: DC

## 2018-10-04 MED ORDER — FUROSEMIDE 10 MG/ML IJ SOLN
40.0000 mg | Freq: Once | INTRAMUSCULAR | Status: AC
  Administered 2018-10-04: 40 mg via INTRAVENOUS
  Filled 2018-10-04: qty 4

## 2018-10-04 MED ORDER — AMIODARONE LOAD VIA INFUSION: 150.0000 mg | Freq: Once | INTRAVENOUS | Status: DC

## 2018-10-04 MED ORDER — MAGNESIUM SULFATE IN D5W 1-5 GM/100ML-% IV SOLN
1.0000 g | Freq: Once | INTRAVENOUS | Status: AC
  Administered 2018-10-04: 1 g via INTRAVENOUS
  Filled 2018-10-04: qty 100

## 2018-10-04 MED ORDER — AMIODARONE HCL IN DEXTROSE 360-4.14 MG/200ML-% IV SOLN
30.0000 mg/h | INTRAVENOUS | Status: DC
  Administered 2018-10-04 – 2018-10-06 (×5): 30 mg/h via INTRAVENOUS
  Filled 2018-10-04 (×6): qty 200

## 2018-10-04 MED ORDER — IPRATROPIUM BROMIDE 0.02 % IN SOLN
0.5000 mg | Freq: Four times a day (QID) | RESPIRATORY_TRACT | Status: DC
  Administered 2018-10-04 – 2018-10-05 (×6): 0.5 mg via RESPIRATORY_TRACT
  Filled 2018-10-04 (×6): qty 2.5

## 2018-10-04 MED ORDER — AMIODARONE HCL IN DEXTROSE 360-4.14 MG/200ML-% IV SOLN
60.0000 mg/h | INTRAVENOUS | Status: AC
  Administered 2018-10-04 (×2): 60 mg/h via INTRAVENOUS
  Filled 2018-10-04: qty 200

## 2018-10-04 MED ORDER — AMIODARONE HCL IN DEXTROSE 360-4.14 MG/200ML-% IV SOLN: 30.0000 mg/h | INTRAVENOUS | Status: DC

## 2018-10-04 MED ORDER — AMIODARONE HCL IN DEXTROSE 360-4.14 MG/200ML-% IV SOLN
INTRAVENOUS | Status: AC
  Administered 2018-10-04: 150 mg via INTRAVENOUS
  Filled 2018-10-04: qty 200

## 2018-10-04 NOTE — Progress Notes (Signed)
Progress Note  Patient Name: Sarah Escobar Date of Encounter: 10/04/2018  Primary Cardiologist: Dorris Carnes, MD   Subjective   Reports chest tightness and palpitations.  More SOB when HR is elevated.   Inpatient Medications    Scheduled Meds: . acetaminophen  1,000 mg Oral Q6H   Or  . acetaminophen (TYLENOL) oral liquid 160 mg/5 mL  1,000 mg Per Tube Q6H  . amiodarone  150 mg Intravenous Once  . aspirin EC  325 mg Oral Daily   Or  . aspirin  324 mg Per Tube Daily  . atorvastatin  80 mg Oral q1800  . bisacodyl  10 mg Oral Daily   Or  . bisacodyl  10 mg Rectal Daily  . buPROPion  300 mg Oral q morning - 10a  . Chlorhexidine Gluconate Cloth  6 each Topical Daily  . cholecalciferol  2,000 Units Oral Daily  . digoxin  0.1875 mg Oral Daily  . docusate sodium  200 mg Oral Daily  . enoxaparin (LOVENOX) injection  30 mg Subcutaneous QHS  . finasteride  2.5 mg Oral Daily  . fluticasone  2 spray Each Nare BID  . fluticasone furoate-vilanterol  1 puff Inhalation Daily  . guaiFENesin  600 mg Oral BID  . insulin aspart  0-24 Units Subcutaneous Q4H  . ipratropium-albuterol  3 mL Nebulization Q6H  . loratadine  10 mg Oral Daily  . mouth rinse  15 mL Mouth Rinse BID  . metoprolol tartrate  12.5 mg Oral BID   Or  . metoprolol tartrate  12.5 mg Per Tube BID  . pantoprazole  40 mg Oral Daily  . sodium chloride  2 spray Each Nare BID  . sodium chloride flush  10-40 mL Intracatheter Q12H  . sodium chloride flush  10-40 mL Intracatheter Q12H  . sodium chloride flush  3 mL Intravenous Q12H   Continuous Infusions: . sodium chloride Stopped (10/01/18 0834)  . sodium chloride    . sodium chloride Stopped (10/03/18 2100)  . amiodarone     Followed by  . amiodarone    . cefTRIAXone (ROCEPHIN)  IV Stopped (10/03/18 1353)  . lactated ringers    . lactated ringers Stopped (10/03/18 1742)  . lactated ringers Stopped (10/03/18 2100)  . nitroGLYCERIN    . phenylephrine (NEO-SYNEPHRINE)  Adult infusion Stopped (10/01/18 0133)   PRN Meds: sodium chloride, ipratropium-albuterol, lactated ringers, LORazepam, metoprolol tartrate, ondansetron (ZOFRAN) IV, oxyCODONE, sodium chloride flush, sodium chloride flush, sodium chloride flush, traMADol   Vital Signs    Vitals:   10/04/18 0300 10/04/18 0400 10/04/18 0500 10/04/18 0600  BP: (!) 76/63 93/64 (!) 81/55 96/69  Pulse: (!) 134 (!) 131 (!) 137 (!) 133  Resp: 19 20 (!) 23 (!) 23  Temp:  97.9 F (36.6 C)    TempSrc:  Oral    SpO2: 96% 94% 96% (!) 85%  Weight:   65.7 kg   Height:        Intake/Output Summary (Last 24 hours) at 10/04/2018 0801 Last data filed at 10/04/2018 0400 Gross per 24 hour  Intake 684.14 ml  Output 600 ml  Net 84.14 ml   Filed Weights   10/01/18 0600 10/02/18 0500 10/04/18 0500  Weight: 65.4 kg 66.4 kg 65.7 kg    Telemetry    Mostly sinus tachycardia with frequent PACs.  Also episodes of atrial fibrillation.  Rate 130-150s. Personally Reviewed  ECG    Sinus rhythm.  Rate 84 bpm.  QTc 470 ms.  -  Personally Reviewed  Physical Exam   VS:  BP 96/69   Pulse (!) 133   Temp 97.9 F (36.6 C) (Oral)   Resp (!) 23   Ht 5\' 6"  (1.676 m)   Wt 65.7 kg   SpO2 (!) 85%   BMI 23.38 kg/m  , BMI Body mass index is 23.38 kg/m. GENERAL:  Ill-appearing HEENT: Pupils equal round and reactive, fundi not visualized, oral mucosa unremarkable NECK:  No jugular venous distention, waveform within normal limits, carotid upstroke brisk and symmetric, no bruits LUNGS: Bibasilar crackles CHEST: Midline sternotomy with surgical dressing intact. HEART: Tachycardic.  Irregularly irregular.  PMI not displaced or sustained,S1 and S2 within normal limits, no S3, no S4, no clicks, no rubs, no murmurs ABD:  Flat, positive bowel sounds normal in frequency in pitch, no bruits, no rebound, no guarding, no midline pulsatile mass, no hepatomegaly, no splenomegaly EXT:  2 plus pulses throughout, no edema, no cyanosis no  clubbing.  Vein graft dressings intact. SKIN:  No rashes no nodules NEURO:  Cranial nerves II through XII grossly intact, motor grossly intact throughout [facility]:  Cognitively intact, oriented to person place and time    Labs    Chemistry Recent Labs  Lab 10/02/18 0440 10/03/18 0312 10/04/18 0532  NA 133* 130* 129*  K 4.0 3.6 3.9  CL 104 101 99  CO2 24 25 25   GLUCOSE 96 116* 97  BUN 14 14 15   CREATININE 0.99 0.91 0.86  CALCIUM 7.9* 8.1* 8.0*  GFRNONAA 60* >60 >60  GFRAA >60 >60 >60  ANIONGAP 5 4* 5     Hematology Recent Labs  Lab 10/02/18 0440 10/03/18 0756 10/04/18 0532  WBC 12.5* 13.6* 11.9*  RBC 2.74* 2.82* 2.65*  HGB 9.0* 9.1* 8.7*  HCT 27.2* 27.8* 25.9*  MCV 99.3 98.6 97.7  MCH 32.8 32.3 32.8  MCHC 33.1 32.7 33.6  RDW 12.7 12.6 12.6  PLT 110* 128* 158    Cardiac Enzymes No results for input(s): TROPONINI in the last 168 hours.  No results for input(s): TROPIPOC in the last 168 hours.   BNPNo results for input(s): BNP, PROBNP in the last 168 hours.   DDimer  No results for input(s): DDIMER in the last 168 hours.   Radiology    Dg Chest Port 1 View  Result Date: 10/03/2018 CLINICAL DATA:  Chest soreness.  CABG. EXAM: PORTABLE CHEST 1 VIEW COMPARISON:  10/02/2018 FINDINGS: Prior CABG. Interval removal of left chest tube. No pneumothorax. Cardiomegaly. Interstitial opacities noted in both lungs compatible with chronic interstitial lung disease/fibrosis. Somewhat confluent airspace opacity in the right lung base could reflect superimposed atelectasis or infiltrate. Mild cardiomegaly. IMPRESSION: Interval removal of left chest tube without pneumothorax. Fibrotic changes within the lungs. Focal opacity at the right lung base could reflect atelectasis or infiltrate. Electronically Signed   By: Rolm Baptise M.D.   On: 10/03/2018 08:38   [place] Ekg Site Rite  Result Date: 10/03/2018 If Site Rite image not attached, placement could not be confirmed due to current  cardiac rhythm.   Cardiac Studies   Echo 09/27/18: Study Conclusions  - Left ventricle: The cavity size was normal. There was mild focal   basal hypertrophy of the septum. Systolic function was normal.   The estimated ejection fraction was in the range of 55% to 60%.   There is hypokinesis of the mid septal myocardium. Doppler   parameters are consistent with abnormal left ventricular   relaxation (grade 1 diastolic dysfunction).  Impressions:  - Septal hypokinesis with overall preserved LV systolic function;   mild diastolic dysfunction; sclerotic aortic valve.  LHC 09/26/18: Conclusions: 1. Diffuse, heavy calcification of the coronary arteries with significant 2-vessel CAD.  There is an 80% ostial/proximal LAD stenosis as well as an eccentric 70% mid RCA lesion. 2. Normal left ventricular systolic function and filling pressure.  Recommendations: 1. Images reviewed with Dr. Ellyn Hack.  We both agree that given heavy calcification and ostial disease of the LAD would be best suited for bypass.  PCI would be high risk, requiring atherectomy and stenting back into the distal LMCA.  Cardiac surgery has been contacted for consultation. 2. Restart heparin infusion 2 hours after TR band removal. 3. Metoprolol and IV nitroglycerin for antianginal therapy. 4. Aggressive secondary prevention. 5. Obtain transthoracic echocardiogram.  Patient Profile     62 y.o. female with hypertension, hyperlipidemia, ILD, bronchiectasis,   Assessment & Plan    # NSTEMI: # 2 vessel CAD: Patient underwent three-vessel CABG with Dr. Servando Snare on 10/01/2018.  She had a LIMA to the LAD, SVG to the diagonal, and SVG to distal RCA.   Continue aspirin, metoprolol and atorvastatin.  Preoperative LVEF is 55 to 60%.  She is mildly volume overloaded.  Diurese as BP will tolerate.  # Post-operative atrial fibrillation: This morning Ms. Ramroop is going much faster.  It seems to be both atrial fibrillation and  sinus tachycardia with frequent PACs.  She is feeling worse and heart rate is not well-controlled on digoxin.  BP too low to titrate nodal agents.  Per Pulmonology she is OK to be on amiodarone in the short term.  Will restart amiodarone bolus and infusion.  Stop digoxin.  If she gets oral amiodarone she would need a 1 month wash out before starting a different antiarrhythmic.   Continue metoprolol.  She has a history of hemoptysis. Lung biopsy pending.  No anticoagulation for now.  Would consider anticoagulation prior to discharge as she continues to have paroxysms of atrial fibrillation.   This patients CHA2DS2-VASc Score and unadjusted Ischemic Stroke Rate (% per year) is equal to 3.2 % stroke rate/year from a score of 3  Above score calculated as 1 point each if present [CHF, HTN, DM, Vascular=MI/PAD/Aortic Plaque, Age if 65-74, or Female] Above score calculated as 2 points each if present [Age > 75, or Stroke/TIA/TE]   Time spent: 40 minutes-Greater than 50% of this time was spent in counseling, explanation of diagnosis, planning of further management, and coordination of care.      For questions or updates, please contact [place] Please consult [email or web address] for contact info under        Signed, Skeet Latch, MD  10/04/2018, 8:01 AM

## 2018-10-04 NOTE — Progress Notes (Signed)
NAME:  Sarah Escobar, MRN:  [id number], DOB:  Dec 12, 1955, LOS: 62 ADMISSION DATE:  09/26/2018, CONSULTATION DATE:  09/28/18 REFERRING MD:  Dr Nils Pyle, CHIEF COMPLAINT:  Pre CABG pulm  Evaluation f   HPI   Sarah Escobar -is being seen for preoperative pulmonary evaluation prior to bypass surgery.  She is known to have interstitial lung disease followed by Dr. Elsworth Soho.  She has a known UIP pattern.  She now presented with chest pain and has been set up for a bypass on Monday, September 22, 2018.  Dr. Darcey Nora has requested preoperative pulmonary clearance.  In terms of interstitial lung disease: She reports that she has been following in the pulmonary clinic for a few years.  In 2015 she had hypersensitivity pneumonitis panel and it was normal.  She had ANA and rheumatoid factor antibodies in 2015 and these were normal.  She reports that up until 6 months ago she was exercising on her stationary bike for 5 miles without any problems.  But then she stopped exercising because she was developing tiredness.he had a high-resolution CT scan of the chest March 2019 that I personally visualized.  The official report is that she has air-trapping slight upper lobe predominant reticulation.  Significant progression compared to March 2016,  3 years earlier.  There is some honeycombing in the left upper lobe.  Features were thought to be consistent with hypersensitivity pneumonitis chronic.  Then on March 04, 2018 she underwent bronchoscopy with lavage that showed polymorphs of 94% and a transbronchial biopsy that showed nonspecific inflammation but tissue was felt to be insufficient..    She has been on observation therapy.  She is not on chronic prednisone.  She has not been on any oxygen.  Never had any chest pain until this admission.Marland Kitchen  However she stopped exercising because she was having this tiredness 6 months ago and. did not notice any shortness of breath with activities of daily living except 3 weeks ago when she  walked 1 mile in [place] and she had to stop 3 times which is more than her husband.  She had never had this problem a year earlier.  Pulmonary function test done yesterday September 27, 2018 shows a 17% decline in [facility] compared to 4-1/2 years earlier which is compatible with a CT chest showing progression.  She is aware of a progressive interstitial lung disease.  Interstitial lung disease related questions: This was elicited without a formal questionnaire.  She denies any obvious mold or mildew exposure in the house.  She does not use any water fountains, CPAP, humidifier.  Denies any mildew exposure in the house.  Denies working in a [employer].  She works out of home.  Does not do any gardening.  No exposure to rotten wood or mulch.  No usage of feather pillows or blankets.  No pet hamsters or gerbils in the house.  No birds in the house.  She has never been exposed to amiodarone or chemotherapy or radiation therapy.  5 or 6 years ago she did take nitrofurantoin for a few weeks only.  Her grandmother died of some lung disease not otherwise specified.  She is a previous smoker 30 pack.  Quit 16 years ago.  In terms of connective tissue disease and collagen vascular disease: She denies this.  But on closer questioning she says that in the winter season her fingers might turn blue.  In terms of etiologic work-up May 2015 ANA and rheumatoid factor negative.  February 2017 ANA negative.  Anti-Jo 1 in May 2019- negative.  Blood allergy panel in August 2016- including IgE.  Other issue: She has chronic sinusitis.  2 weeks ago she did get antibiotics for an acute flareup.  She also took a few days of prednisone.   Hospital course: Since admission has not had any chest pain.  She is worried about pulmonary complications following bypass on account of interstitial lung disease.  Results for ELSYE, MCCOLLISTER (MRN [id number]) as of 09/28/2018 12:18  Ref. Range 04/07/2014 12:57 09/27/2018 10:23  FVC-Pre Latest  Units: L 2.54 2.10 (17% decline in 4.5 years)  FVC-%Pred-Pre Latest Units: % 75 60  FEV1-Pre Latest Units: L 2.27 1.92  FEV1-%Pred-Pre Latest Units: % 86 71  Pre FEV1/FVC ratio Latest Units: % 89 91  Results for LAYNEE, LOCKAMY (MRN [id number]) as of 09/28/2018 12:18  Ref. Range 04/07/2014 12:57  DLCO unc Latest Units: ml/min/mmHg 18.56  DLCO unc % pred Latest Units: % 76   Past Medical History     has a past medical history of Allergy, Asthma, Depression, Hyperlipidemia, Hypertension, Osteopenia, Personal history of adenomatous colonic polyps/FHx colon cancer sister and father, and Sciatica of left side.   reports that she quit smoking about 16 years ago. Her smoking use included cigarettes. She has a 30.00 pack-year smoking history. She has never used smokeless tobacco.  Past Surgical History:  Procedure Laterality Date  . BUNIONECTOMY    . CERVICAL FUSION  1999   Dr Arnoldo Morale, [place]  . COLONOSCOPY  2013  . COLONOSCOPY W/ POLYPECTOMY  multiple   adenomas; [name]; last 2013  . CORONARY ARTERY BYPASS GRAFT N/A 09/30/2018   Procedure: CORONARY ARTERY BYPASS GRAFTING (CABG) X THREE, LIMA TO LAD, SVG TO DIAGONAL, SVG TO PDA  USING LEFT INTERNAL MAMMERY ARTERY AND RIGHT AND LEFT GREATER SAPHENOUS VEIN;  Surgeon: [name], MD;  Location: [facility];  Service: Open Heart Surgery;  Laterality: N/A;  . discetomy lumbar  02/19/13   L1&2; Dr Arnoldo Morale, [place]  . LEFT HEART CATH AND CORONARY ANGIOGRAPHY N/A 09/26/2018   Procedure: LEFT HEART CATH AND CORONARY ANGIOGRAPHY;  Surgeon: Nelva Bush, MD;  Location: [facility];  Service: Cardiovascular;  Laterality: N/A;  . NASAL SINUS SURGERY  [phone]   X 3  . POLYPECTOMY  1217--2013   TA+  . TEE WITHOUT CARDIOVERSION N/A 09/30/2018   Procedure: TRANSESOPHAGEAL ECHOCARDIOGRAM (TEE);  Surgeon: [name], MD;  Location: [facility];  Service: Open Heart Surgery;  Laterality: N/A;  . TOTAL ABDOMINAL HYSTERECTOMY W/ BILATERAL  SALPINGOOPHORECTOMY  2003   endometriosis   . VIDEO BRONCHOSCOPY Bilateral 03/04/2018   Procedure: VIDEO BRONCHOSCOPY WITH FLUORO;  Surgeon: Rigoberto Noel, MD;  Location: [facility];  Service: Cardiopulmonary;  Laterality: Bilateral;  . WEDGE RESECTION Left 09/30/2018   Procedure: LEFT UPPER LOBE WEDGE RESECTION;  Surgeon: [name], MD;  Location: [facility];  Service: Open Heart Surgery;  Laterality: Left;    Allergies  Allergen Reactions  . Flagyl [Metronidazole] Other (See Comments)    Nerve pain  . Levofloxacin Rash  . Loperamide Hcl Rash  . Neomycin-Bacitracin Zn-Polymyx Rash  . Ofloxacin Rash  . Sulfonamide Derivatives Hives  . Band-Aid Liquid Bandage [New Skin] Other (See Comments)    Regular band-aid cause itching and redness  . Collodion Dermatitis  . Gabapentin Other (See Comments)    Mental status changes  . Latex Other (See Comments) and Dermatitis    "redness"  .  Sulfa Antibiotics Hives  . Oxycontin [Oxycodone Hcl] Rash         Immunization History  Administered Date(s) Administered  . Influenza Whole 09/09/2008, 09/10/2009, 09/03/2012  . Influenza,inj,Quad PF,6+ Mos 08/25/2014, 09/03/2017, 09/17/2018  . Influenza-Unspecified 09/03/2013, 09/09/2015, 09/06/2016  . Pneumococcal Conjugate-13 08/25/2014  . Pneumococcal Polysaccharide-23 01/14/2007, 10/31/2017  . Tdap 10/29/2013    Family History  Problem Relation Age of Onset  . Colon cancer Father   . Heart attack Father 34  . Diabetes Father   . Stroke Father 64  . Colon cancer Sister   . Asthma Sister   . Asthma Mother   . Colitis Mother   . Cirrhosis Mother        non alcoholic, fatty liver  . Asthma Maternal Aunt   . Heart attack Sister 55  . Cirrhosis Brother        non alcoholic  . Colon polyps Brother   . Anemia Brother         Spherocytosis, hereditrary  . Asthma Maternal Grandmother   . Kidney failure Unknown        2 bro, 1 sister ? from HTN  . Prostate cancer Brother   . Breast  cancer Neg Hx      Current Facility-Administered Medications:  .  0.45 % sodium chloride infusion, , Intravenous, Continuous PRN, Barrett, Erin R, PA-C, Stopped at 10/01/18 0834 .  0.9 %  sodium chloride infusion, 250 mL, Intravenous, Continuous, Barrett, Erin R, PA-C .  0.9 %  sodium chloride infusion, , Intravenous, Continuous, Barrett, Erin R, PA-C, Stopped at 10/03/18 2100 .  acetaminophen (TYLENOL) tablet 1,000 mg, 1,000 mg, Oral, Q6H, 1,000 mg at 10/04/18 0526 **OR** acetaminophen (TYLENOL) solution 1,000 mg, 1,000 mg, Per Tube, Q6H, Barrett, Erin R, PA-C .  [COMPLETED] amiodarone (NEXTERONE) 1.8 mg/mL load via infusion 150 mg, 150 mg, Intravenous, Once, 150 mg at 10/04/18 1040 **FOLLOWED BY** amiodarone (NEXTERONE PREMIX) 360-4.14 MG/200ML-% (1.8 mg/mL) IV infusion, 60 mg/hr, Intravenous, Continuous, Last Rate: 33.3 mL/hr at 10/04/18 1043, 60 mg/hr at 10/04/18 1043 **FOLLOWED BY** amiodarone (NEXTERONE PREMIX) 360-4.14 MG/200ML-% (1.8 mg/mL) IV infusion, 30 mg/hr, Intravenous, Continuous, Daune Perch, NP .  aspirin EC tablet 325 mg, 325 mg, Oral, Daily, 325 mg at 10/04/18 1050 **OR** aspirin chewable tablet 324 mg, 324 mg, Per Tube, Daily, Barrett, Erin R, PA-C .  atorvastatin (LIPITOR) tablet 80 mg, 80 mg, Oral, q1800, Barrett, Erin R, PA-C, 80 mg at 10/03/18 1800 .  bisacodyl (DULCOLAX) EC tablet 10 mg, 10 mg, Oral, Daily, 10 mg at 10/04/18 1051 **OR** bisacodyl (DULCOLAX) suppository 10 mg, 10 mg, Rectal, Daily, Barrett, Erin R, PA-C .  buPROPion (WELLBUTRIN XL) 24 hr tablet 300 mg, 300 mg, Oral, q morning - 10a, Barrett, Erin R, PA-C, 300 mg at 10/04/18 1047 .  cefTRIAXone (ROCEPHIN) 2 g in sodium chloride 0.9 % 100 mL IVPB, 2 g, Intravenous, Q24H, Rigoberto Noel, MD, Stopped at 10/03/18 1353 .  Chlorhexidine Gluconate Cloth 2 % PADS 6 each, 6 each, Topical, Daily, [name], MD, 6 each at 10/03/18 1816 .  cholecalciferol (VITAMIN D) tablet 2,000 Units, 2,000 Units, Oral,  Daily, Barrett, Erin R, PA-C, 2,000 Units at 10/04/18 1052 .  docusate sodium (COLACE) capsule 200 mg, 200 mg, Oral, Daily, Barrett, Erin R, PA-C, 200 mg at 10/04/18 1051 .  enoxaparin (LOVENOX) injection 30 mg, 30 mg, Subcutaneous, QHS, [name], MD, 30 mg at 10/03/18 2235 .  finasteride (PROSCAR) tablet 2.5 mg, 2.5 mg,  Oral, Daily, Barrett, Erin R, PA-C, 2.5 mg at 10/04/18 1110 .  fluticasone (FLONASE) 50 MCG/ACT nasal spray 2 spray, 2 spray, Each Nare, BID, Barrett, Erin R, PA-C, 2 spray at 10/04/18 1055 .  fluticasone furoate-vilanterol (BREO ELLIPTA) 100-25 MCG/INH 1 puff, 1 puff, Inhalation, Daily, Barrett, Erin R, PA-C, 1 puff at 10/04/18 0802 .  guaiFENesin (MUCINEX) 12 hr tablet 600 mg, 600 mg, Oral, BID, Conte, Tessa N, PA-C, 600 mg at 10/04/18 1048 .  CBG monitoring, , , Q4H **AND** insulin aspart (novoLOG) injection 0-24 Units, 0-24 Units, Subcutaneous, Q4H, [name], MD, 4 Units at 10/04/18 [phone] .  ipratropium (ATROVENT) nebulizer solution 0.5 mg, 0.5 mg, Nebulization, Q6H, Gerhardt, Edward B, MD .  lactated ringers infusion 500 mL, 500 mL, Intravenous, Once PRN, Barrett, Erin R, PA-C .  lactated ringers infusion, , Intravenous, Continuous, Barrett, Erin R, PA-C, Stopped at 10/03/18 1742 .  lactated ringers infusion, , Intravenous, Continuous, Barrett, Erin R, PA-C, Stopped at 10/03/18 2100 .  levalbuterol (XOPENEX) nebulizer solution 1.25 mg, 1.25 mg, Nebulization, Q6H, [name], MD .  loratadine (CLARITIN) tablet 10 mg, 10 mg, Oral, Daily, Barrett, Erin R, PA-C, 10 mg at 10/04/18 1048 .  LORazepam (ATIVAN) tablet 0.5 mg, 0.5 mg, Oral, Q6H PRN, Barrett, Erin R, PA-C, 0.5 mg at 10/04/18 0114 .  MEDLINE mouth rinse, 15 mL, Mouth Rinse, BID, [name], MD, 15 mL at 10/03/18 2240 .  metoprolol tartrate (LOPRESSOR) tablet 12.5 mg, 12.5 mg, Oral, BID, 12.5 mg at 10/04/18 1047 **OR** metoprolol tartrate (LOPRESSOR) 25 mg/10 mL oral suspension 12.5 mg,  12.5 mg, Per Tube, BID, Barrett, Erin R, PA-C .  metoprolol tartrate (LOPRESSOR) injection 2.5-5 mg, 2.5-5 mg, Intravenous, Q2H PRN, Barrett, Erin R, PA-C, 5 mg at 10/02/18 1702 .  nitroGLYCERIN 50 mg in dextrose 5 % 250 mL (0.2 mg/mL) infusion, 0-100 mcg/min, Intravenous, Titrated, Barrett, Erin R, PA-C .  ondansetron (ZOFRAN) injection 4 mg, 4 mg, Intravenous, Q6H PRN, Barrett, Erin R, PA-C .  oxyCODONE (Oxy IR/ROXICODONE) immediate release tablet 5 mg, 5 mg, Oral, Q4H PRN, [name], MD, 5 mg at 10/04/18 0748 .  pantoprazole (PROTONIX) EC tablet 40 mg, 40 mg, Oral, Daily, Barrett, Erin R, PA-C, 40 mg at 10/04/18 1048 .  phenylephrine (NEOSYNEPHRINE) 20-0.9 MG/250ML-% infusion, 0-100 mcg/min, Intravenous, Titrated, Barrett, Erin R, PA-C, Stopped at 10/01/18 0133 .  sodium chloride (OCEAN) 0.65 % nasal spray 2 spray, 2 spray, Each Nare, BID, Barrett, Erin R, PA-C, 2 spray at 10/04/18 1054 .  sodium chloride flush (NS) 0.9 % injection 10-40 mL, 10-40 mL, Intracatheter, Q12H, [name], MD, 10 mL at 10/04/18 1055 .  sodium chloride flush (NS) 0.9 % injection 10-40 mL, 10-40 mL, Intracatheter, PRN, Lanelle Bal B, MD .  sodium chloride flush (NS) 0.9 % injection 10-40 mL, 10-40 mL, Intracatheter, Q12H, [name], MD, 10 mL at 10/04/18 1055 .  sodium chloride flush (NS) 0.9 % injection 10-40 mL, 10-40 mL, Intracatheter, PRN, [name], MD .  sodium chloride flush (NS) 0.9 % injection 3 mL, 3 mL, Intravenous, Q12H, Barrett, Erin R, PA-C, 3 mL at 10/04/18 1055 .  sodium chloride flush (NS) 0.9 % injection 3 mL, 3 mL, Intravenous, PRN, Barrett, Erin R, PA-C .  traMADol (ULTRAM) tablet 50-100 mg, 50-100 mg, Oral, Q4H PRN, Barrett, Erin R, PA-C, 100 mg at 10/02/18 [facility] Events   09/26/2018 0 admit 09/28/18 - CCM consult pre CABG  Consults: date of consult/date signed off & final recs:  09/28/18 - ccm consult  Procedures (surgical and  bedside):  10/28:CABG x 3 10/28: Wedge resection of LUL with biopsy and cuture  Significant Diagnostic Tests:  HRCT Chest Spectrum of findings compatible with fibrotic interstitial lung disease with moderate air trapping. Chronic hypersensitivity pneumonitis is favored. Minimal progression of bronchiectasis since 02/14/2018 high-resolution chest CT study. Findings are otherwise stable. Findings are suggestive of an alternative diagnosis   Micro Data:  10/28: Sputum for AFB>> 10/28: Sputum for Culture ( Anaerobic and Aerobic)>> 10/28: Sputum for Culture?? GS + for few gram + cocci>>   Surgical pathology>>pending  Antimicrobials:  Zinacef: 09/30/2018>>10/31 Rocephin  10/31>> 09/30/2018: Vanc x 1 dose   SUBJECTIVE/OVERNIGHT/INTERVAL HX  Awake alert . She is in fib with rate of 120 and she is anxious Encouraged to deep breathe and cough    Objective   Blood pressure 96/69, pulse (!) 133, temperature 97.9 F (36.6 C), temperature source Oral, resp. rate (!) 23, height _0  (1.676 m), weight 65.7 kg, SpO2 95 %.        Intake/Output Summary (Last 24 hours) at 10/04/2018 1126 Last data filed at 10/04/2018 0400 Gross per 24 hour  Intake 684.14 ml  Output 600 ml  Net 84.14 ml   Filed Weights   10/01/18 0600 10/02/18 0500 10/04/18 0500  Weight: 65.4 kg 66.4 kg 65.7 kg    General: Frail elderly female who is anxious, in NAD on 5 L [place], sats 94% HEENT: + JVD, +  lymphadenopathy is appreciated Neuro: Intact, awake and alert, MAE x 4, appropriate CV: In  paroxysmal atrial fibrillation, sternal incision unremarkable, S1. S2, Irr, No RMG PULM: Decreased breath sounds throughout L>R, basilar crackles , poor pulmonary excursion OZ:HYQM, non-tender, ND,  bsx4 active  Extremities: warm/dry, 1+ edema bilateral lower extremity dressings are intact, no obvious deformities noted Skin: no rashes or lesions, no tattoos     Resolved Hospital Problem list   10/28>>  Extubation  Assessment & Plan:   #Preoperative pulmonary evaluation: Her FEV1 is less than 75% on account of the interstitial lung disease but she saturating 96% on room air and is quite functional and has intact nutritional status and renal function.  Therefore on the objective testing models above her risk for prolonged mechanical ventilation or reintubation following bypass surgery is less than 8% [in an 62 year old model set that did not include cardiac surgery].  In the more recent analysis the real risk is less than 3% [Gupta].  Similar risk profile for postoperative pneumonia as well.  However when he look at a composite of risk factors that include pneumonia and respiratory failure and atelectasis and effusion and oxygen dependence.  The risk for this is fairly high especially she got antibiotics and prednisone 2 weeks ago.  Nevertheless this is not prohibitive risk especially given the fact bypass is definitely indicated.  Intubation for CABG in previous smoker with ILD per HRCT Increased risk of post operative pulmonary complications History of dyspnea prior to admission Extubation post op without difficulty CXR with Atelectasis, interstitial edema and small bilateral pleural effusions  Plan: Titrate oxygen for sats > 92% Aggressive Pulmonary toilet Flutter valve IS Mobilize Continue Bronchodilators as needed>> changed to xopenex and atrovent  11/1 due to atrial fib Pain control to encourage deep breaths and coughing Lasix x 40 mg x 1 per cards  Post operative Atrial fibrillation with RVR  HR in the 130's Failed digoxin therapy Plan: Amio  gtt ok short term only per Elsworth Soho Stop digoxin Continue metoprolol No anticoagulation for now, but consider anticoagulation prior to discharge as she continues to have paroxysms of atrial fibrillation.  Support BP EKG prn   #Interstitial lung disease - unknown etiology.  Progressive on imaging. ?  chronic hypersensitivity pneumonitis,  But  extensive directed history she is denying any mold exposure or any organic antigen exposure.  Moreover the BAL neutrophilia in April 2019 does not fit in with hypersensitivity pneumonitis.  Previous autoimmune profile is negative.  Therefore surgical lung biopsy is indicated given the unclear reason for interstitial lung disease.  Plan Await surgical biopsy results currently not available 10/03/2018 Follow-up with outpatient with pulmonary O2 as needed Pulmonary toilet  Leukocytosis  Plan: Follow culture data Trend CXR Currently on antimicrobial therapy Re culture as is clinically indicated  Hyponatremia Recent Labs  Lab 10/02/18 0440 10/03/18 0312 10/04/18 0532  NA 133* 130* 129*     Plan: Minimize free water  #Chronic sinusitis -She feels this is chronic postnasal drip.  She does not feel this is an exacerbation. Plan: Continue nasal hygiene  Disposition / Summary of Today's Plan 10/04/18   Remains in the cardiovascular intensive care unit Being treated with amiodarone for A. fib this is being transitioned to digoxin Remains on 5 L nasal cannula encouraged pulmonary toilet  10/04/2018 CXR reviewed by me  Interim removal of right IJ sheath. Right PICC line stable position. Prior CABG. Diffuse interstitial prominence noted bilaterally suggesting a component of interstitial edema. Pneumonitis can't be excluded. . Small bilateral pleural effusions. Carotid vascular disease cannot be excluded.    Diet: Heart healthy Pain/Anxiety/Delirium protocol (if indicated): Incisional pain relief VAP protocol (if indicated): x DVT prophylaxis: per cards GI prophylaxis: GI Hyperglycemia protocol: Scale insulin Mobility: Bed Code Status: full Family Communication: 10/03/2018 patient and husband updated bedside     Monette, AGACNP-BC [place] Pager # [phone] If no answer [phone] 10/04/2018, 11:26 AM

## 2018-10-04 NOTE — Progress Notes (Signed)
[address]       [place] [address]             [phone]      4 Days Post-Op Procedure(s) (LRB): CORONARY ARTERY BYPASS GRAFTING (CABG) X THREE, LIMA TO LAD, SVG TO DIAGONAL, SVG TO PDA  USING LEFT INTERNAL MAMMERY ARTERY AND RIGHT AND LEFT GREATER SAPHENOUS VEIN (N/A) TRANSESOPHAGEAL ECHOCARDIOGRAM (TEE) (N/A) LEFT UPPER LOBE WEDGE RESECTION (Left) Subjective: She feels short of breath while walking and fatigued.   Objective: Vital signs in last 24 hours: Temp:  [97.6 F (36.4 C)-98.8 F (37.1 C)] 97.9 F (36.6 C) (11/01 0400) Pulse Rate:  [46-148] 133 (11/01 0600) Cardiac Rhythm: Atrial fibrillation;Sinus tachycardia;Normal sinus rhythm (11/01 0400) Resp:  [17-32] 23 (11/01 0600) BP: (76-123)/(49-83) 96/69 (11/01 0600) SpO2:  [85 %-99 %] 85 % (11/01 0600) Weight:  [65.7 kg] 65.7 kg (11/01 0500)     Intake/Output from previous day: 10/31 0701 - 11/01 0700 In: 684.1 [P.O.:240; I.V.:344.1; IV Piggyback:100] Out: 600 [Urine:600]  Intake/Output this shift: No intake/output data recorded.  General appearance: alert, cooperative and no distress Heart: regular rate and rhythm, S1, S2 normal, no murmur, click, rub or gallop Lungs: crackles bilaterally Abdomen: soft, non-tender; bowel sounds normal; no masses,  no organomegaly Extremities: extremities normal, atraumatic, no cyanosis or edema Wound: clean and dry  Lab Results: Recent Labs    10/03/18 0756 10/04/18 0532  WBC 13.6* 11.9*  HGB 9.1* 8.7*  HCT 27.8* 25.9*  PLT 128* 158   BMET:  Recent Labs    10/03/18 0312 10/04/18 0532  NA 130* 129*  K 3.6 3.9  CL 101 99  CO2 25 25  GLUCOSE 116* 97  BUN 14 15  CREATININE 0.91 0.86  CALCIUM 8.1* 8.0*    PT/INR: No results for input(s): LABPROT, INR in the last 72 hours. ABG    Component Value Date/Time   PHART 7.349 (L) 09/30/2018 2016   HCO3 22.1 09/30/2018 2016   TCO2 23 10/01/2018 1649   ACIDBASEDEF 3.0 (H) 09/30/2018 2016   O2SAT 95.0 09/30/2018 2016   CBG (last 3)  Recent Labs    10/03/18 1630 10/03/18 2315 10/04/18 0340  GLUCAP 95 104* 98    Results for orders placed or performed during the hospital encounter of 09/26/18  MRSA PCR Screening     Status: None   Collection Time: 09/26/18  6:37 PM  Result Value Ref Range Status   MRSA by PCR NEGATIVE NEGATIVE Final    Comment:        The GeneXpert MRSA Assay (FDA approved for NASAL specimens only), is one component of a comprehensive MRSA colonization surveillance program. It is not intended to diagnose MRSA infection nor to guide or monitor treatment for MRSA infections. Performed at [facility], [place] [address] [place], [place] [address]   Surgical pcr screen     Status: None   Collection Time: 09/28/18 10:43 AM  Result Value Ref Range Status   MRSA, PCR NEGATIVE NEGATIVE Final   Staphylococcus aureus NEGATIVE NEGATIVE Final    Comment: (NOTE) The Xpert SA Assay (FDA approved for NASAL specimens in patients 81 years of age and older), is one component of a comprehensive surveillance program. It is not intended to diagnose infection nor to guide or monitor treatment. Performed at [facility], [place] [address], [place], [place] [address]   Fungus Culture With Stain     Status: None (Preliminary  result)   Collection Time: 09/30/18 10:17 AM  Result Value Ref Range Status   Fungus Stain Final report  Final    Comment: (NOTE) Performed At: [facility] [place], [place] [id number] Rush Farmer MD ON:[id number]    Fungus (Mycology) Culture PENDING  Incomplete   Fungal Source TISSUE  Final    Comment: Performed at [facility], [place] [address], [place], [place] [address]  Aerobic/Anaerobic Culture (surgical/deep wound)     Status: None (Preliminary result)   Collection Time: 09/30/18 10:17 AM  Result Value Ref Range Status   Specimen Description TISSUE  Final   Special Requests NONE  Final   Gram  Stain   Final    RARE WBC PRESENT, PREDOMINANTLY MONONUCLEAR RARE GRAM NEGATIVE RODS    Culture   Final    NO GROWTH 3 DAYS NO ANAEROBES ISOLATED; CULTURE IN PROGRESS FOR 5 DAYS Performed at [facility], [address], [place], [place] [address]    Report Status PENDING  Incomplete  Acid Fast Smear (AFB)     Status: None   Collection Time: 09/30/18 10:17 AM  Result Value Ref Range Status   AFB Specimen Processing Comment  Final    Comment: Tissue Grinding and Digestion/Decontamination   Acid Fast Smear Negative  Final    Comment: (NOTE) Performed At: [facility] [place], [place] [id number] Rush Farmer MD QV:[id number]    Source (AFB) TISSUE  Final    Comment: Performed at [facility], [place] [address], [place], [place] [address]  Fungus Culture Result     Status: None   Collection Time: 09/30/18 10:17 AM  Result Value Ref Range Status   Result 1 Comment  Final    Comment: (NOTE) KOH/Calcofluor preparation:  no fungus observed. Performed At: [facility] [place], [place] [id number] Rush Farmer MD KZ:[id number]     Assessment/Plan: S/P Procedure(s) (LRB): CORONARY ARTERY BYPASS GRAFTING (CABG) X THREE, LIMA TO LAD, SVG TO DIAGONAL, SVG TO PDA  USING LEFT INTERNAL MAMMERY ARTERY AND RIGHT AND LEFT GREATER SAPHENOUS VEIN (N/A) TRANSESOPHAGEAL ECHOCARDIOGRAM (TEE) (N/A) LEFT UPPER LOBE WEDGE RESECTION (Left)  1. CV-Atrial fibrillation in the 130s. Cardiology following and assisting in management. Amio was stopped and she was started on Digoxin. Will likely need increased since her rate is not yet controlled.  2. Pulm-CXR stable but infiltrates still present. Sputum culture was sent-will follow. Started on Rocephin prophylacticly.  Continue DuoNebs and Mucinex. Pulmonary following.  3. Renal-creatinine 0.86, electrolytes okay 4. H and H 8.7/25.9, platelets 158k, expected acute blood loss anemia 5. Endo-blood  glucose well controlled on current regimen  Plan: Give additional dose of IV lasix today.  Critical care to assist with pulmonary function. She is on Rocephin, Breo Ellipta, Duo nebs, and mucinex. Continue to titrate Digoxin per Cardiology. She remains uncontrolled with her rate.    LOS: 8 days    Elgie Collard 10/04/2018

## 2018-10-04 NOTE — Progress Notes (Signed)
Patient ID: Sarah Escobar, female   DOB: 01-04-1956, 61 y.o.   MRN: [id number] [facility] Evening Rounds:  Hemodynamically stable  Atrial fib today and amio resumed. Now 120's. sats 94% Good urine output.

## 2018-10-05 ENCOUNTER — Inpatient Hospital Stay (HOSPITAL_COMMUNITY): Payer: 59

## 2018-10-05 DIAGNOSIS — J810 Acute pulmonary edema: Secondary | ICD-10-CM

## 2018-10-05 DIAGNOSIS — Z515 Encounter for palliative care: Secondary | ICD-10-CM

## 2018-10-05 DIAGNOSIS — J84112 Idiopathic pulmonary fibrosis: Secondary | ICD-10-CM

## 2018-10-05 DIAGNOSIS — E876 Hypokalemia: Secondary | ICD-10-CM

## 2018-10-05 DIAGNOSIS — Z7189 Other specified counseling: Secondary | ICD-10-CM

## 2018-10-05 DIAGNOSIS — J9611 Chronic respiratory failure with hypoxia: Secondary | ICD-10-CM

## 2018-10-05 LAB — BASIC METABOLIC PANEL
ANION GAP: 3 — AB (ref 5–15)
Anion gap: 5 (ref 5–15)
BUN: 14 mg/dL (ref 8–23)
BUN: 11 mg/dL (ref 8–23)
CALCIUM: 8 mg/dL — AB (ref 8.9–10.3)
CALCIUM: 8.3 mg/dL — AB (ref 8.9–10.3)
CHLORIDE: 96 mmol/L — AB (ref 98–111)
CO2: 27 mmol/L (ref 22–32)
CO2: 27 mmol/L (ref 22–32)
CREATININE: 0.87 mg/dL (ref 0.44–1.00)
CREATININE: 0.82 mg/dL (ref 0.44–1.00)
Chloride: 96 mmol/L — ABNORMAL LOW (ref 98–111)
Glucose, Bld: 136 mg/dL — ABNORMAL HIGH (ref 70–99)
Glucose, Bld: 147 mg/dL — ABNORMAL HIGH (ref 70–99)
Potassium: 3.9 mmol/L (ref 3.5–5.1)
Potassium: 3.8 mmol/L (ref 3.5–5.1)
SODIUM: 126 mmol/L — AB (ref 135–145)
SODIUM: 128 mmol/L — AB (ref 135–145)

## 2018-10-05 LAB — GLUCOSE, CAPILLARY
Glucose-Capillary: 100 mg/dL — ABNORMAL HIGH (ref 70–99)
Glucose-Capillary: 82 mg/dL (ref 70–99)
Glucose-Capillary: 95 mg/dL (ref 70–99)
Glucose-Capillary: 99 mg/dL (ref 70–99)

## 2018-10-05 LAB — CBC
HEMATOCRIT: 25.5 % — AB (ref 36.0–46.0)
Hemoglobin: 8.5 g/dL — ABNORMAL LOW (ref 12.0–15.0)
MCH: 32.3 pg (ref 26.0–34.0)
MCHC: 33.3 g/dL (ref 30.0–36.0)
MCV: 97 fL (ref 80.0–100.0)
NRBC: 0 % (ref 0.0–0.2)
Platelets: 196 10*3/uL (ref 150–400)
RBC: 2.63 MIL/uL — ABNORMAL LOW (ref 3.87–5.11)
RDW: 12.5 % (ref 11.5–15.5)
WBC: 11.3 10*3/uL — AB (ref 4.0–10.5)

## 2018-10-05 LAB — AEROBIC/ANAEROBIC CULTURE W GRAM STAIN (SURGICAL/DEEP WOUND): Culture: NO GROWTH

## 2018-10-05 LAB — MAGNESIUM: MAGNESIUM: 2.1 mg/dL (ref 1.7–2.4)

## 2018-10-05 LAB — SODIUM: SODIUM: 130 mmol/L — AB (ref 135–145)

## 2018-10-05 MED ORDER — BOOST / RESOURCE BREEZE PO LIQD CUSTOM
1.0000 | Freq: Three times a day (TID) | ORAL | Status: DC
  Administered 2018-10-05 – 2018-10-11 (×13): 1 via ORAL
  Filled 2018-10-05: qty 1

## 2018-10-05 MED ORDER — HYDROCODONE-ACETAMINOPHEN 5-325 MG PO TABS
1.0000 | ORAL_TABLET | ORAL | Status: DC | PRN
  Administered 2018-10-05 (×2): 2 via ORAL
  Administered 2018-10-06: 1 via ORAL
  Administered 2018-10-07 (×2): 2 via ORAL
  Administered 2018-10-08 (×2): 1 via ORAL
  Filled 2018-10-05: qty 2
  Filled 2018-10-05: qty 1
  Filled 2018-10-05 (×4): qty 2
  Filled 2018-10-05: qty 1
  Filled 2018-10-05: qty 2

## 2018-10-05 MED ORDER — TOLVAPTAN 15 MG PO TABS
15.0000 mg | ORAL_TABLET | ORAL | Status: AC
  Administered 2018-10-05 – 2018-10-07 (×3): 15 mg via ORAL
  Filled 2018-10-05 (×3): qty 1

## 2018-10-05 MED ORDER — IPRATROPIUM BROMIDE 0.02 % IN SOLN
0.5000 mg | Freq: Three times a day (TID) | RESPIRATORY_TRACT | Status: DC
  Administered 2018-10-06 – 2018-10-23 (×53): 0.5 mg via RESPIRATORY_TRACT
  Filled 2018-10-05 (×54): qty 2.5

## 2018-10-05 MED ORDER — LEVALBUTEROL HCL 1.25 MG/0.5ML IN NEBU
1.2500 mg | INHALATION_SOLUTION | Freq: Three times a day (TID) | RESPIRATORY_TRACT | Status: DC
  Administered 2018-10-06 – 2018-10-23 (×53): 1.25 mg via RESPIRATORY_TRACT
  Filled 2018-10-05 (×54): qty 0.5

## 2018-10-05 MED ORDER — GUAIFENESIN ER 600 MG PO TB12
1200.0000 mg | ORAL_TABLET | Freq: Two times a day (BID) | ORAL | Status: DC
  Administered 2018-10-05 – 2018-10-19 (×29): 1200 mg via ORAL
  Filled 2018-10-05 (×30): qty 2

## 2018-10-05 NOTE — Progress Notes (Signed)
NAME:  Sarah Escobar, MRN:  [id number], DOB:  August 15, 1956, LOS: 9 ADMISSION DATE:  09/26/2018, CONSULTATION DATE:  09/28/18 REFERRING MD:  Dr Nils Pyle, CHIEF COMPLAINT:  Pre CABG pulm  Evaluation f   HPI   Sarah Escobar -is being seen for preoperative pulmonary evaluation prior to bypass surgery.  She is known to have interstitial lung disease followed by Dr. Elsworth Soho.  She has a known UIP pattern.  She now presented with chest pain and has been set up for a bypass on Monday, September 22, 2018.  Dr. Darcey Nora has requested preoperative pulmonary clearance.  In terms of interstitial lung disease: She reports that she has been following in the pulmonary clinic for a few years.  In 2015 she had hypersensitivity pneumonitis panel and it was normal.  She had ANA and rheumatoid factor antibodies in 2015 and these were normal.  She reports that up until 6 months ago she was exercising on her stationary bike for 5 miles without any problems.  But then she stopped exercising because she was developing tiredness.he had a high-resolution CT scan of the chest March 2019 that I personally visualized.  The official report is that she has air-trapping slight upper lobe predominant reticulation.  Significant progression compared to March 2016,  3 years earlier.  There is some honeycombing in the left upper lobe.  Features were thought to be consistent with hypersensitivity pneumonitis chronic.  Then on March 04, 2018 she underwent bronchoscopy with lavage that showed polymorphs of 94% and a transbronchial biopsy that showed nonspecific inflammation but tissue was felt to be insufficient..    She has been on observation therapy.  She is not on chronic prednisone.  She has not been on any oxygen.  Never had any chest pain until this admission.Marland Kitchen  However she stopped exercising because she was having this tiredness 6 months ago and. did not notice any shortness of breath with activities of daily living except 3 weeks ago when she  walked 1 mile in [place] and she had to stop 3 times which is more than her husband.  She had never had this problem a year earlier.  Pulmonary function test done yesterday September 27, 2018 shows a 17% decline in [facility] compared to 4-1/2 years earlier which is compatible with a CT chest showing progression.  She is aware of a progressive interstitial lung disease.  Interstitial lung disease related questions: This was elicited without a formal questionnaire.  She denies any obvious mold or mildew exposure in the house.  She does not use any water fountains, CPAP, humidifier.  Denies any mildew exposure in the house.  Denies working in a [employer].  She works out of home.  Does not do any gardening.  No exposure to rotten wood or mulch.  No usage of feather pillows or blankets.  No pet hamsters or gerbils in the house.  No birds in the house.  She has never been exposed to amiodarone or chemotherapy or radiation therapy.  5 or 6 years ago she did take nitrofurantoin for a few weeks only.  Her grandmother died of some lung disease not otherwise specified.  She is a previous smoker 30 pack.  Quit 16 years ago.  In terms of connective tissue disease and collagen vascular disease: She denies this.  But on closer questioning she says that in the winter season her fingers might turn blue.  In terms of etiologic work-up May 2015 ANA and rheumatoid factor negative.  February 2017 ANA negative.  Anti-Jo 1 in May 2019- negative.  Blood allergy panel in August 2016- including IgE.  Other issue: She has chronic sinusitis.  2 weeks ago she did get antibiotics for an acute flareup.  She also took a few days of prednisone.   Hospital course: Since admission has not had any chest pain.  She is worried about pulmonary complications following bypass on account of interstitial lung disease.  Results for Sarah Escobar, Sarah Escobar (MRN [id number]) as of 09/28/2018 12:18  Ref. Range 04/07/2014 12:57 09/27/2018 10:23  FVC-Pre Latest  Units: L 2.54 2.10 (17% decline in 4.5 years)  FVC-%Pred-Pre Latest Units: % 75 60  FEV1-Pre Latest Units: L 2.27 1.92  FEV1-%Pred-Pre Latest Units: % 86 71  Pre FEV1/FVC ratio Latest Units: % 89 91  Results for Sarah Escobar, Sarah Escobar (MRN [id number]) as of 09/28/2018 12:18  Ref. Range 04/07/2014 12:57  DLCO unc Latest Units: ml/min/mmHg 18.56  DLCO unc % pred Latest Units: % 76   Past Medical History     has a past medical history of Allergy, Asthma, Depression, Hyperlipidemia, Hypertension, Osteopenia, Personal history of adenomatous colonic polyps/FHx colon cancer sister and father, and Sciatica of left side.   reports that she quit smoking about 16 years ago. Her smoking use included cigarettes. She has a 30.00 pack-year smoking history. She has never used smokeless tobacco.  Past Surgical History:  Procedure Laterality Date  . BUNIONECTOMY    . CERVICAL FUSION  1999   Dr Arnoldo Morale, [place]  . COLONOSCOPY  2013  . COLONOSCOPY W/ POLYPECTOMY  multiple   adenomas; [name]; last 2013  . CORONARY ARTERY BYPASS GRAFT N/A 09/30/2018   Procedure: CORONARY ARTERY BYPASS GRAFTING (CABG) X THREE, LIMA TO LAD, SVG TO DIAGONAL, SVG TO PDA  USING LEFT INTERNAL MAMMERY ARTERY AND RIGHT AND LEFT GREATER SAPHENOUS VEIN;  Surgeon: [name], MD;  Location: [facility];  Service: Open Heart Surgery;  Laterality: N/A;  . discetomy lumbar  02/19/13   L1&2; Dr Arnoldo Morale, [place]  . LEFT HEART CATH AND CORONARY ANGIOGRAPHY N/A 09/26/2018   Procedure: LEFT HEART CATH AND CORONARY ANGIOGRAPHY;  Surgeon: Nelva Bush, MD;  Location: [facility];  Service: Cardiovascular;  Laterality: N/A;  . NASAL SINUS SURGERY  [phone]   X 3  . POLYPECTOMY  1217--2013   TA+  . TEE WITHOUT CARDIOVERSION N/A 09/30/2018   Procedure: TRANSESOPHAGEAL ECHOCARDIOGRAM (TEE);  Surgeon: [name], MD;  Location: [facility];  Service: Open Heart Surgery;  Laterality: N/A;  . TOTAL ABDOMINAL HYSTERECTOMY W/ BILATERAL  SALPINGOOPHORECTOMY  2003   endometriosis   . VIDEO BRONCHOSCOPY Bilateral 03/04/2018   Procedure: VIDEO BRONCHOSCOPY WITH FLUORO;  Surgeon: Rigoberto Noel, MD;  Location: [facility];  Service: Cardiopulmonary;  Laterality: Bilateral;  . WEDGE RESECTION Left 09/30/2018   Procedure: LEFT UPPER LOBE WEDGE RESECTION;  Surgeon: [name], MD;  Location: [place] [facility];  Service: Open Heart Surgery;  Laterality: Left;    Allergies  Allergen Reactions  . Flagyl [Metronidazole] Other (See Comments)    Nerve pain  . Levofloxacin Rash  . Loperamide Hcl Rash  . Neomycin-Bacitracin Zn-Polymyx Rash  . Ofloxacin Rash  . Sulfonamide Derivatives Hives  . Band-Aid Liquid Bandage [New Skin] Other (See Comments)    Regular band-aid cause itching and redness  . Collodion Dermatitis  . Gabapentin Other (See Comments)    Mental status changes  . Latex Other (See Comments) and Dermatitis    "redness"  .  Sulfa Antibiotics Hives  . Oxycontin [Oxycodone Hcl] Rash         Immunization History  Administered Date(s) Administered  . Influenza Whole 09/09/2008, 09/10/2009, 09/03/2012  . Influenza,inj,Quad PF,6+ Mos 08/25/2014, 09/03/2017, 09/17/2018  . Influenza-Unspecified 09/03/2013, 09/09/2015, 09/06/2016  . Pneumococcal Conjugate-13 08/25/2014  . Pneumococcal Polysaccharide-23 01/14/2007, 10/31/2017  . Tdap 10/29/2013    Family History  Problem Relation Age of Onset  . Colon cancer Father   . Heart attack Father 42  . Diabetes Father   . Stroke Father 86  . Colon cancer Sister   . Asthma Sister   . Asthma Mother   . Colitis Mother   . Cirrhosis Mother        non alcoholic, fatty liver  . Asthma Maternal Aunt   . Heart attack Sister 53  . Cirrhosis Brother        non alcoholic  . Colon polyps Brother   . Anemia Brother         Spherocytosis, hereditrary  . Asthma Maternal Grandmother   . Kidney failure Unknown        2 bro, 1 sister ? from HTN  . Prostate cancer Brother   . Breast  cancer Neg Hx      Current Facility-Administered Medications:  .  0.45 % sodium chloride infusion, , Intravenous, Continuous PRN, Barrett, Erin R, PA-C, Stopped at 10/01/18 0834 .  0.9 %  sodium chloride infusion, 250 mL, Intravenous, Continuous, Barrett, Erin R, PA-C .  0.9 %  sodium chloride infusion, , Intravenous, Continuous, Barrett, Erin R, PA-C, Stopped at 10/03/18 2100 .  acetaminophen (TYLENOL) tablet 1,000 mg, 1,000 mg, Oral, Q6H, 1,000 mg at 10/05/18 0615 **OR** acetaminophen (TYLENOL) solution 1,000 mg, 1,000 mg, Per Tube, Q6H, Barrett, Erin R, PA-C .  [COMPLETED] amiodarone (NEXTERONE) 1.8 mg/mL load via infusion 150 mg, 150 mg, Intravenous, Once, 150 mg at 10/04/18 1040 **FOLLOWED BY** [EXPIRED] amiodarone (NEXTERONE PREMIX) 360-4.14 MG/200ML-% (1.8 mg/mL) IV infusion, 60 mg/hr, Intravenous, Continuous, Stopped at 10/04/18 1606 **FOLLOWED BY** amiodarone (NEXTERONE PREMIX) 360-4.14 MG/200ML-% (1.8 mg/mL) IV infusion, 30 mg/hr, Intravenous, Continuous, Daune Perch, NP, Last Rate: 16.67 mL/hr at 10/05/18 1100, 30 mg/hr at 10/05/18 1100 .  aspirin EC tablet 325 mg, 325 mg, Oral, Daily, 325 mg at 10/05/18 1005 **OR** aspirin chewable tablet 324 mg, 324 mg, Per Tube, Daily, Barrett, Erin R, PA-C .  atorvastatin (LIPITOR) tablet 80 mg, 80 mg, Oral, q1800, Barrett, Erin R, PA-C, 80 mg at 10/04/18 1858 .  buPROPion (WELLBUTRIN XL) 24 hr tablet 300 mg, 300 mg, Oral, q morning - 10a, Barrett, Erin R, PA-C, 300 mg at 10/05/18 1004 .  cefTRIAXone (ROCEPHIN) 2 g in sodium chloride 0.9 % 100 mL IVPB, 2 g, Intravenous, Q24H, Rigoberto Noel, MD, Stopped at 10/04/18 1455 .  Chlorhexidine Gluconate Cloth 2 % PADS 6 each, 6 each, Topical, Daily, [name], MD, 6 each at 10/05/18 1026 .  cholecalciferol (VITAMIN D) tablet 2,000 Units, 2,000 Units, Oral, Daily, Barrett, Erin R, PA-C, 2,000 Units at 10/05/18 1005 .  enoxaparin (LOVENOX) injection 30 mg, 30 mg, Subcutaneous, QHS, [name], MD, 30 mg at 10/04/18 2131 .  finasteride (PROSCAR) tablet 2.5 mg, 2.5 mg, Oral, Daily, Barrett, Erin R, PA-C, 2.5 mg at 10/04/18 1110 .  fluticasone (FLONASE) 50 MCG/ACT nasal spray 2 spray, 2 spray, Each Nare, BID, Barrett, Erin R, PA-C, 2 spray at 10/05/18 1026 .  fluticasone furoate-vilanterol (BREO ELLIPTA) 100-25 MCG/INH 1 puff, 1 puff,  Inhalation, Daily, Barrett, Erin R, PA-C, 1 puff at 10/05/18 1016 .  guaiFENesin (MUCINEX) 12 hr tablet 600 mg, 600 mg, Oral, BID, Conte, Tessa N, PA-C, 600 mg at 10/05/18 1005 .  CBG monitoring, , , Q4H **AND** insulin aspart (novoLOG) injection 0-24 Units, 0-24 Units, Subcutaneous, Q4H, [name], MD, 4 Units at 10/04/18 [id number] .  ipratropium (ATROVENT) nebulizer solution 0.5 mg, 0.5 mg, Nebulization, Q6H, Skeet Latch, MD, 0.5 mg at 10/05/18 1016 .  lactated ringers infusion 500 mL, 500 mL, Intravenous, Once PRN, Barrett, Erin R, PA-C .  lactated ringers infusion, , Intravenous, Continuous, Barrett, Erin R, PA-C, Stopped at 10/03/18 1742 .  lactated ringers infusion, , Intravenous, Continuous, Barrett, Erin R, PA-C, Stopped at 10/03/18 2100 .  levalbuterol (XOPENEX) nebulizer solution 1.25 mg, 1.25 mg, Nebulization, Q6H, Skeet Latch, MD, 1.25 mg at 10/05/18 1017 .  loratadine (CLARITIN) tablet 10 mg, 10 mg, Oral, Daily, Barrett, Erin R, PA-C, 10 mg at 10/05/18 1005 .  LORazepam (ATIVAN) tablet 0.5 mg, 0.5 mg, Oral, Q6H PRN, Barrett, Erin R, PA-C, 0.5 mg at 10/05/18 0101 .  MEDLINE mouth rinse, 15 mL, Mouth Rinse, BID, [name], MD, 15 mL at 10/03/18 2240 .  metoprolol tartrate (LOPRESSOR) tablet 12.5 mg, 12.5 mg, Oral, BID, 12.5 mg at 10/04/18 2130 **OR** metoprolol tartrate (LOPRESSOR) 25 mg/10 mL oral suspension 12.5 mg, 12.5 mg, Per Tube, BID, Barrett, Erin R, PA-C .  metoprolol tartrate (LOPRESSOR) injection 2.5-5 mg, 2.5-5 mg, Intravenous, Q2H PRN, Barrett, Erin R, PA-C, 5 mg at 10/02/18 1702 .  nitroGLYCERIN 50 mg in  dextrose 5 % 250 mL (0.2 mg/mL) infusion, 0-100 mcg/min, Intravenous, Titrated, Barrett, Erin R, PA-C .  ondansetron (ZOFRAN) injection 4 mg, 4 mg, Intravenous, Q6H PRN, Barrett, Erin R, PA-C .  oxyCODONE (Oxy IR/ROXICODONE) immediate release tablet 5 mg, 5 mg, Oral, Q4H PRN, [name], MD, 5 mg at 10/04/18 1616 .  pantoprazole (PROTONIX) EC tablet 40 mg, 40 mg, Oral, Daily, Barrett, Erin R, PA-C, 40 mg at 10/05/18 1005 .  phenylephrine (NEOSYNEPHRINE) 20-0.9 MG/250ML-% infusion, 0-100 mcg/min, Intravenous, Titrated, Barrett, Erin R, PA-C, Stopped at 10/01/18 0133 .  sodium chloride (OCEAN) 0.65 % nasal spray 2 spray, 2 spray, Each Nare, BID, Barrett, Erin R, PA-C, 2 spray at 10/05/18 1026 .  sodium chloride flush (NS) 0.9 % injection 10-40 mL, 10-40 mL, Intracatheter, Q12H, [name], MD, 10 mL at 10/04/18 2131 .  sodium chloride flush (NS) 0.9 % injection 10-40 mL, 10-40 mL, Intracatheter, PRN, Lanelle Bal B, MD .  sodium chloride flush (NS) 0.9 % injection 10-40 mL, 10-40 mL, Intracatheter, Q12H, [name], MD, 10 mL at 10/05/18 1027 .  sodium chloride flush (NS) 0.9 % injection 10-40 mL, 10-40 mL, Intracatheter, PRN, [name], MD .  sodium chloride flush (NS) 0.9 % injection 3 mL, 3 mL, Intravenous, Q12H, Barrett, Erin R, PA-C, 3 mL at 10/04/18 2132 .  sodium chloride flush (NS) 0.9 % injection 3 mL, 3 mL, Intravenous, PRN, Barrett, Erin R, PA-C .  traMADol (ULTRAM) tablet 50-100 mg, 50-100 mg, Oral, Q4H PRN, Barrett, Erin R, PA-C, 100 mg at 10/04/18 [facility] Events   09/26/2018 0 admit 09/28/18 - CCM consult pre CABG  Consults: date of consult/date signed off & final recs:  09/28/18 - ccm consult  Procedures (surgical and bedside):  10/28:CABG x 3 10/28: Wedge resection of LUL with biopsy and cuture  Significant Diagnostic Tests:  HRCT Chest  that I reviewed myself, fibrotic changes noted Spectrum of findings compatible with  fibrotic interstitial lung disease with moderate air trapping. Chronic hypersensitivity pneumonitis is favored. Minimal progression of bronchiectasis since 02/14/2018 high-resolution chest CT study. Findings are otherwise stable. Findings are suggestive of an alternative diagnosis   Micro Data:  10/28: Sputum for AFB>> 10/28: Sputum for Culture ( Anaerobic and Aerobic)>> 10/28: Sputum for Culture?? GS + for few gram + cocci>>  Surgical pathology>>pending  Antimicrobials:  Zinacef: 09/30/2018>>10/31 Rocephin  10/31>> 09/30/2018: Vanc x 1 dose  SUBJECTIVE/OVERNIGHT/INTERVAL HX  No events overnight No new complaints  Objective   Blood pressure 105/71, pulse 99, temperature 97.9 F (36.6 C), temperature source Oral, resp. rate (!) 22, height _0  (1.676 m), weight 66.4 kg, SpO2 97 %.        Intake/Output Summary (Last 24 hours) at 10/05/2018 1116 Last data filed at 10/05/2018 1100 Gross per 24 hour  Intake 522.72 ml  Output 1275 ml  Net -752.28 ml   Filed Weights   10/02/18 0500 10/04/18 0500 10/05/18 0500  Weight: 66.4 kg 65.7 kg 66.4 kg    General: Frail elderly female, older than stated age on 8L HFNC HEENT: [place]/AT, PERRL, EOM-I and MMM Neuro: Alert and interactive, moving all ext to command CV: IRIR, Nl S1/S2 and -M/R/G PULM: Decreased BS in the bases GI: Soft, NT, ND and +BS Extremities: 1+ edema Skin: no rashes or lesions, no tattoos  Resolved Hospital Problem list   10/28>> Extubation  Assessment & Plan:   Preoperative pulmonary evaluation: Her FEV1 is less than 75% on account of the interstitial lung disease but she saturating 96% on room air and is quite functional and has intact nutritional status and renal function.  Therefore on the objective testing models above her risk for prolonged mechanical ventilation or reintubation following bypass surgery is less than 1% [in an 62 year old model set that did not include cardiac surgery].  In the more recent  analysis the real risk is less than 3% [Gupta].  Similar risk profile for postoperative pneumonia as well.  However when he look at a composite of risk factors that include pneumonia and respiratory failure and atelectasis and effusion and oxygen dependence.  The risk for this is fairly high especially she got antibiotics and prednisone 2 weeks ago.  Nevertheless this is not prohibitive risk especially given the fact bypass is definitely indicated.  Intubation for CABG in previous smoker with ILD per HRCT Increased risk of post operative pulmonary complications History of dyspnea prior to admission Extubation post op without difficulty CXR with Atelectasis, interstitial edema and small bilateral pleural effusions  Plan: Titrate O2 for sat of 92% Continue pulmonary hygienes IS Flutter valve PT OOB BD as ordered Pain control to encourage deep breaths and coughing Diureses  Post operative Atrial fibrillation with RVR  HR in the 130's Failed digoxin therapy Plan: Amio short term given pulmonary function D/C dig Lopressor for BP and HR control No anticoagulation for now, but consider anticoagulation prior to discharge as she continues to have paroxysms of atrial fibrillation.    Interstitial lung disease - unknown etiology.  Progressive on imaging. ?  chronic hypersensitivity pneumonitis,  But extensive directed history she is denying any mold exposure or any organic antigen exposure.  Moreover the BAL neutrophilia in April 2019 does not fit in with hypersensitivity pneumonitis.  Previous autoimmune profile is negative.  Therefore surgical lung biopsy is indicated given the unclear reason for interstitial lung disease.  Plan Surgical  biopsy result pending Follow-up with outpatient with pulmonary O2 as needed Pulmonary toilet  Leukocytosis  Plan: F/u cultures  Trend CXR Currently on antimicrobial therapy  Hyponatremia Recent Labs  Lab 10/03/18 0312 10/04/18 0532  10/05/18 0454  NA 130* 129* 126*   Plan: Minimize free water  Chronic sinusitis -She feels this is chronic postnasal drip.  She does not feel this is an exacerbation. Plan: Continue nasal hygiene  Disposition / Summary of Today's Plan 10/05/18   As above  PCCM will see again on Monday   SIGNATURE   Discussed with PCCM-NP  Rush Farmer, M.D. [facility] Pulmonary/Critical Care Medicine. Pager: [phone]. After hours pager: [phone].

## 2018-10-05 NOTE — Progress Notes (Signed)
5 Days Post-Op Procedure(s) (LRB): CORONARY ARTERY BYPASS GRAFTING (CABG) X THREE, LIMA TO LAD, SVG TO DIAGONAL, SVG TO PDA  USING LEFT INTERNAL MAMMERY ARTERY AND RIGHT AND LEFT GREATER SAPHENOUS VEIN (N/A) TRANSESOPHAGEAL ECHOCARDIOGRAM (TEE) (N/A) LEFT UPPER LOBE WEDGE RESECTION (Left) Subjective: Chest sore from coughing. Wants to switch pain med to hydrocodone which has worked better for her in past after back surgery.  Ambulated some.  Eating well  Objective: Vital signs in last 24 hours: Temp:  [97.7 F (36.5 C)-98.9 F (37.2 C)] 97.8 F (36.6 C) (11/02 1222) Pulse Rate:  [81-122] 97 (11/02 1200) Cardiac Rhythm: Normal sinus rhythm (11/02 1130) Resp:  [17-31] 27 (11/02 1200) BP: (89-121)/(49-71) 117/66 (11/02 1200) SpO2:  [86 %-100 %] 100 % (11/02 1200) Weight:  [66.4 kg] 66.4 kg (11/02 0500)  Hemodynamic parameters for last 24 hours:    Intake/Output from previous day: 11/01 0701 - 11/02 0700 In: 456.2 [P.O.:240; I.V.:216.2] Out: 1275 [Urine:1275] Intake/Output this shift: Total I/O In: 83.2 [I.V.:83.2] Out: 501 [Urine:500; Stool:1]  General appearance: alert and cooperative Neurologic: intact Heart: regular rate and rhythm, S1, S2 normal, no murmur, click, rub or gallop Lungs: bilateral rales Extremities: edema mild in legs Wound: incisions ok  Lab Results: Recent Labs    10/04/18 0532 10/05/18 0454  WBC 11.9* 11.3*  HGB 8.7* 8.5*  HCT 25.9* 25.5*  PLT 158 196   BMET:  Recent Labs    10/04/18 0532 10/05/18 0454  NA 129* 126*  K 3.9 3.9  CL 99 96*  CO2 25 27  GLUCOSE 97 136*  BUN 15 14  CREATININE 0.86 0.87  CALCIUM 8.0* 8.0*    PT/INR: No results for input(s): LABPROT, INR in the last 72 hours. ABG    Component Value Date/Time   PHART 7.349 (L) 09/30/2018 2016   HCO3 22.1 09/30/2018 2016   TCO2 23 10/01/2018 1649   ACIDBASEDEF 3.0 (H) 09/30/2018 2016   O2SAT 95.0 09/30/2018 2016   CBG (last 3)  Recent Labs    10/05/18 0441  10/05/18 0753 10/05/18 1224  GLUCAP 95 100* 82    CLINICAL DATA:  CABG 5 days ago.  EXAM: PORTABLE CHEST 1 VIEW  COMPARISON:  One-view chest x-ray 10/04/2018  FINDINGS: The heart size is normal. Lung volumes are low. A right-sided PICC line is stable. The interstitial pattern is stable, predominantly underlying fibrotic change. Mild bibasilar atelectasis is superimposed.  IMPRESSION: 1. Persistent low lung volumes and mild bibasilar atelectasis. 2. Underlying pulmonary fibrosis.   Electronically Signed   By: San Morelle M.D.   On: 10/05/2018 07:46  Assessment/Plan: S/P Procedure(s) (LRB): CORONARY ARTERY BYPASS GRAFTING (CABG) X THREE, LIMA TO LAD, SVG TO DIAGONAL, SVG TO PDA  USING LEFT INTERNAL MAMMERY ARTERY AND RIGHT AND LEFT GREATER SAPHENOUS VEIN (N/A) TRANSESOPHAGEAL ECHOCARDIOGRAM (TEE) (N/A) LEFT UPPER LOBE WEDGE RESECTION (Left)  Hemodynamically stable in sinus rhythm on IV amio. Continue per EP.  ILD of unclear etiology. Pulmonary medicine is following. On Ceftriaxone for leukocytosis and air space disease on CXR. She was asking about using her Neti pot. I advised against that with her lung issues going on . I wonder if she could have had some contamination from that contributing to her lung problems. Respiratory and lung tissue cultures pending.  Hyponatremia has been progressive. Wt is only about 5 lbs over preop. Will try a short course of Tolvaptan for 3 days.  Switch pain med to Vicodin at pt request to see if it relieves her  pain better.  Encouraged to use heart pillow to stabilize chest when coughing.  Continue IS, ambulation. Keep in ICU.     LOS: 9 days    Gaye Pollack 10/05/2018

## 2018-10-05 NOTE — Progress Notes (Signed)
Patient ID: Sarah Escobar, female   DOB: Jun 25, 1956, 62 y.o.   MRN: [id number] [facility] Evening Rounds:  Hemodynamically stable  Sinus 97 on IV amio  sats 97% on 8L HFNC  Urine output ok

## 2018-10-05 NOTE — Progress Notes (Signed)
Progress Note  Patient Name: Sarah Escobar Date of Encounter: 10/05/2018  Primary Cardiologist: Dorris Carnes, MD   Subjective   Has converted to sinus rhythm on amiodarone.  Continues to have postop chest pain.  Did have some desaturations overnight.  Is currently working with RT.  Inpatient Medications    Scheduled Meds: . acetaminophen  1,000 mg Oral Q6H   Or  . acetaminophen (TYLENOL) oral liquid 160 mg/5 mL  1,000 mg Per Tube Q6H  . aspirin EC  325 mg Oral Daily   Or  . aspirin  324 mg Per Tube Daily  . atorvastatin  80 mg Oral q1800  . buPROPion  300 mg Oral q morning - 10a  . Chlorhexidine Gluconate Cloth  6 each Topical Daily  . cholecalciferol  2,000 Units Oral Daily  . enoxaparin (LOVENOX) injection  30 mg Subcutaneous QHS  . finasteride  2.5 mg Oral Daily  . fluticasone  2 spray Each Nare BID  . fluticasone furoate-vilanterol  1 puff Inhalation Daily  . guaiFENesin  600 mg Oral BID  . insulin aspart  0-24 Units Subcutaneous Q4H  . ipratropium  0.5 mg Nebulization Q6H  . levalbuterol  1.25 mg Nebulization Q6H  . loratadine  10 mg Oral Daily  . mouth rinse  15 mL Mouth Rinse BID  . metoprolol tartrate  12.5 mg Oral BID   Or  . metoprolol tartrate  12.5 mg Per Tube BID  . pantoprazole  40 mg Oral Daily  . sodium chloride  2 spray Each Nare BID  . sodium chloride flush  10-40 mL Intracatheter Q12H  . sodium chloride flush  10-40 mL Intracatheter Q12H  . sodium chloride flush  3 mL Intravenous Q12H   Continuous Infusions: . sodium chloride Stopped (10/01/18 0834)  . sodium chloride    . sodium chloride Stopped (10/03/18 2100)  . amiodarone 30 mg/hr (10/04/18 2330)  . cefTRIAXone (ROCEPHIN)  IV Stopped (10/04/18 1455)  . lactated ringers    . lactated ringers Stopped (10/03/18 1742)  . lactated ringers Stopped (10/03/18 2100)  . nitroGLYCERIN    . phenylephrine (NEO-SYNEPHRINE) Adult infusion Stopped (10/01/18 0133)   PRN Meds: sodium chloride, lactated  ringers, LORazepam, metoprolol tartrate, ondansetron (ZOFRAN) IV, oxyCODONE, sodium chloride flush, sodium chloride flush, sodium chloride flush, traMADol   Vital Signs    Vitals:   10/05/18 0500 10/05/18 0600 10/05/18 0757 10/05/18 1018  BP: 108/68 (!) 102/56    Pulse: 88 81    Resp: (!) 31 18    Temp:   97.9 F (36.6 C)   TempSrc:   Oral   SpO2: 99% 100%  97%  Weight: 66.4 kg     Height:        Intake/Output Summary (Last 24 hours) at 10/05/2018 1021 Last data filed at 10/05/2018 0600 Gross per 24 hour  Intake 439.48 ml  Output 1275 ml  Net -835.52 ml   Filed Weights   10/02/18 0500 10/04/18 0500 10/05/18 0500  Weight: 66.4 kg 65.7 kg 66.4 kg    Telemetry    Sinus rhythm with sinus tachycardia personally Reviewed  ECG    None new- Personally Reviewed  Physical Exam   VS:  BP (!) 102/56   Pulse 81   Temp 97.9 F (36.6 C) (Oral)   Resp 18   Ht 5\' 6"  (1.676 m)   Wt 66.4 kg   SpO2 97%   BMI 23.63 kg/m  , BMI Body mass index is 23.63  kg/m. GEN: Ill-appearing HEENT: normal  Neck: no JVD, carotid bruits, or masses Cardiac: RRR; no murmurs, rubs, or gallops,no edema  Respiratory: Mild bibasilar crackles, normal work of breathing GI: soft, nontender, nondistended, + BS MS: no deformity or atrophy  Skin: warm and dry Neuro:  Strength and sensation are intact Psych: euthymic mood, full affect   Labs    Chemistry Recent Labs  Lab 10/03/18 0312 10/04/18 0532 10/05/18 0454  NA 130* 129* 126*  K 3.6 3.9 3.9  CL 101 99 96*  CO2 25 25 27   GLUCOSE 116* 97 136*  BUN 14 15 14   CREATININE 0.91 0.86 0.87  CALCIUM 8.1* 8.0* 8.0*  GFRNONAA >60 >60 >60  GFRAA >60 >60 >60  ANIONGAP 4* 5 3*     Hematology Recent Labs  Lab 10/03/18 0756 10/04/18 0532 10/05/18 0454  WBC 13.6* 11.9* 11.3*  RBC 2.82* 2.65* 2.63*  HGB 9.1* 8.7* 8.5*  HCT 27.8* 25.9* 25.5*  MCV 98.6 97.7 97.0  MCH 32.3 32.8 32.3  MCHC 32.7 33.6 33.3  RDW 12.6 12.6 12.5  PLT 128* 158 196     Cardiac Enzymes No results for input(s): TROPONINI in the last 168 hours.  No results for input(s): TROPIPOC in the last 168 hours.   BNPNo results for input(s): BNP, PROBNP in the last 168 hours.   DDimer  No results for input(s): DDIMER in the last 168 hours.   Radiology    Dg Chest Port 1 View  Result Date: 10/05/2018 CLINICAL DATA:  CABG 5 days ago. EXAM: PORTABLE CHEST 1 VIEW COMPARISON:  One-view chest x-ray 10/04/2018 FINDINGS: The heart size is normal. Lung volumes are low. A right-sided PICC line is stable. The interstitial pattern is stable, predominantly underlying fibrotic change. Mild bibasilar atelectasis is superimposed. IMPRESSION: 1. Persistent low lung volumes and mild bibasilar atelectasis. 2. Underlying pulmonary fibrosis. Electronically Signed   By: San Morelle M.D.   On: 10/05/2018 07:46   Dg Chest Port 1 View  Result Date: 10/04/2018 CLINICAL DATA:  Shortness of breath.  Sore chest. EXAM: PORTABLE CHEST 1 VIEW COMPARISON:  10/03/2018.  10/02/2018.  03/24/2016.  05/10/2015. FINDINGS: Right IJ sheath has been removed. Right PICC line noted with tip over cavoatrial junction. Prior CABG. Cardiomegaly with diffuse bilateral pulmonary interstitial prominence suggesting interstitial edema. Pneumonitis cannot be excluded. A component of chronic underlying interstitial lung disease is also most likely present. Small bilateral pleural effusions. No pneumothorax. Prior cervicothoracic spine fusion. Carotid vascular calcification cannot be excluded. IMPRESSION: 1. Interim removal of right IJ sheath. Right PICC line stable position. 2. Prior CABG. Diffuse interstitial prominence noted bilaterally suggesting a component of interstitial edema. Pneumonitis can't be excluded. Underlying chronic interstitial lung disease is also most likely present. Small bilateral pleural effusions. 3.  Carotid vascular disease cannot be excluded. Electronically Signed   By: Marcello Moores  Register    On: 10/04/2018 08:49    Cardiac Studies   Echo 09/27/18: Study Conclusions  - Left ventricle: The cavity size was normal. There was mild focal   basal hypertrophy of the septum. Systolic function was normal.   The estimated ejection fraction was in the range of 55% to 60%.   There is hypokinesis of the mid septal myocardium. Doppler   parameters are consistent with abnormal left ventricular   relaxation (grade 1 diastolic dysfunction).  Impressions:  - Septal hypokinesis with overall preserved LV systolic function;   mild diastolic dysfunction; sclerotic aortic valve.  LHC 09/26/18: Conclusions: 1. Diffuse, heavy  calcification of the coronary arteries with significant 2-vessel CAD.  There is an 80% ostial/proximal LAD stenosis as well as an eccentric 70% mid RCA lesion. 2. Normal left ventricular systolic function and filling pressure.  Recommendations: 1. Images reviewed with Dr. Ellyn Hack.  We both agree that given heavy calcification and ostial disease of the LAD would be best suited for bypass.  PCI would be high risk, requiring atherectomy and stenting back into the distal LMCA.  Cardiac surgery has been contacted for consultation. 2. Restart heparin infusion 2 hours after TR band removal. 3. Metoprolol and IV nitroglycerin for antianginal therapy. 4. Aggressive secondary prevention. 5. Obtain transthoracic echocardiogram.  Patient Profile     63 y.o. female with hypertension, hyperlipidemia, ILD, bronchiectasis,   Assessment & Plan    # NSTEMI: # 2 vessel CAD:  Underwent CABG with Dr. Servando Snare 10/01/2018 with LIMA to the LAD, saphenous vein to the diagonal, saphenous vein to the RCA.  Continue aspirin, metoprolol, atorvastatin.  Ejection fraction normal postop.   # Post-operative atrial fibrillation: In sinus rhythm this morning with IV amiodarone.  She does have significant lung issues, but her amiodarone would likely be short-term.  We Alaura Schippers continue her on IV  dosing until she leaves the ICU and then would switch her to p.o. amiodarone.  She Jaley Yan may need anticoagulation at discharge, though she has had hemoptysis and is planning a lung biopsy..  I do feel that amiodarone is likely a short-term medication for her.  Maddelyn Rocca likely be able to stop this as an outpatient.   This patients CHA2DS2-VASc Score and unadjusted Ischemic Stroke Rate (% per year) is equal to 3.2 % stroke rate/year from a score of 3  Above score calculated as 1 point each if present [CHF, HTN, DM, Vascular=MI/PAD/Aortic Plaque, Age if 65-74, or Female] Above score calculated as 2 points each if present [Age > 75, or Stroke/TIA/TE]     For questions or updates, please contact [facility] Please consult [email or web address] for contact info under        Signed, Roanna Reaves Meredith Leeds, MD  10/05/2018, 10:21 AM

## 2018-10-06 ENCOUNTER — Inpatient Hospital Stay (HOSPITAL_COMMUNITY): Payer: 59

## 2018-10-06 LAB — BASIC METABOLIC PANEL
Anion gap: 5 (ref 5–15)
BUN: 8 mg/dL (ref 8–23)
CHLORIDE: 99 mmol/L (ref 98–111)
CO2: 28 mmol/L (ref 22–32)
CREATININE: 0.7 mg/dL (ref 0.44–1.00)
Calcium: 8.5 mg/dL — ABNORMAL LOW (ref 8.9–10.3)
GFR calc Af Amer: >60 mL/min (ref >60)
GFR calc non Af Amer: >60 mL/min (ref >60)
Glucose, Bld: 119 mg/dL — ABNORMAL HIGH (ref 70–99)
Potassium: 3.6 mmol/L (ref 3.5–5.1)
Sodium: 132 mmol/L — ABNORMAL LOW (ref 135–145)

## 2018-10-06 LAB — CULTURE, RESPIRATORY

## 2018-10-06 LAB — CULTURE, RESPIRATORY W GRAM STAIN

## 2018-10-06 LAB — SODIUM: Sodium: 134 mmol/L — ABNORMAL LOW (ref 135–145)

## 2018-10-06 MED ORDER — FUROSEMIDE 10 MG/ML IJ SOLN
40.0000 mg | Freq: Once | INTRAMUSCULAR | Status: AC
  Administered 2018-10-06: 40 mg via INTRAVENOUS
  Filled 2018-10-06: qty 4

## 2018-10-06 MED ORDER — POTASSIUM CHLORIDE 10 MEQ/50ML IV SOLN
10.0000 meq | INTRAVENOUS | Status: AC
  Administered 2018-10-06 (×3): 10 meq via INTRAVENOUS
  Filled 2018-10-06 (×3): qty 50

## 2018-10-06 MED ORDER — POTASSIUM CHLORIDE CRYS ER 20 MEQ PO TBCR
40.0000 meq | EXTENDED_RELEASE_TABLET | Freq: Once | ORAL | Status: AC
  Administered 2018-10-06: 40 meq via ORAL
  Filled 2018-10-06: qty 2

## 2018-10-06 MED ORDER — IPRATROPIUM BROMIDE 0.02 % IN SOLN
0.5000 mg | Freq: Four times a day (QID) | RESPIRATORY_TRACT | Status: DC | PRN
  Administered 2018-10-06 – 2018-10-14 (×2): 0.5 mg via RESPIRATORY_TRACT
  Filled 2018-10-06: qty 2.5

## 2018-10-06 MED ORDER — LEVALBUTEROL HCL 1.25 MG/0.5ML IN NEBU
1.2500 mg | INHALATION_SOLUTION | Freq: Four times a day (QID) | RESPIRATORY_TRACT | Status: DC | PRN
  Administered 2018-10-06 – 2018-10-18 (×4): 1.25 mg via RESPIRATORY_TRACT
  Filled 2018-10-06 (×2): qty 0.5

## 2018-10-06 NOTE — Progress Notes (Signed)
Patient ID: Sarah Escobar, female   DOB: [birth year], 62 y.o.   MRN: [id number] TCTS Evening Rounds:  Hemodynamically stable, sinus 90's on IV amio. sats 100% on 15 L HFNC today. Diuresed some.

## 2018-10-06 NOTE — Progress Notes (Signed)
6 Days Post-Op Procedure(s) (LRB): CORONARY ARTERY BYPASS GRAFTING (CABG) X THREE, LIMA TO LAD, SVG TO DIAGONAL, SVG TO PDA  USING LEFT INTERNAL MAMMERY ARTERY AND RIGHT AND LEFT GREATER SAPHENOUS VEIN (N/A) TRANSESOPHAGEAL ECHOCARDIOGRAM (TEE) (N/A) LEFT UPPER LOBE WEDGE RESECTION (Left) Subjective: Feels ok now but did not sleep well. Felt like she could not breath. Went back into atrial fib with RVR about 2 am. Remains on amio drip. Now sinus 88.  Objective: Vital signs in last 24 hours: Temp:  [97.4 F (36.3 C)-98.2 F (36.8 C)] 97.7 F (36.5 C) (11/03 0700) Pulse Rate:  [84-139] 97 (11/03 1000) Cardiac Rhythm: Atrial fibrillation (11/03 0800) Resp:  [19-30] 22 (11/03 1000) BP: (86-117)/(52-91) 113/66 (11/03 1000) SpO2:  [89 %-100 %] 98 % (11/03 0900) Weight:  [65 kg] 65 kg (11/03 0500)  Hemodynamic parameters for last 24 hours:    Intake/Output from previous day: 11/02 0701 - 11/03 0700 In: 1580.9 [P.O.:1160; I.V.:415.3; IV Piggyback:5.5] Out: 2076 [Urine:2075; Stool:1] Intake/Output this shift: Total I/O In: 268.3 [P.O.:120; I.V.:62.2; IV Piggyback:86] Out: -   General appearance: alert and cooperative Heart: regular rate and rhythm, S1, S2 normal, no murmur, click, rub or gallop Lungs: bilateral rales Extremities: extremities normal, atraumatic, no cyanosis or edema Wound: incisions ok  Lab Results: Recent Labs    10/04/18 0532 10/05/18 0454  WBC 11.9* 11.3*  HGB 8.7* 8.5*  HCT 25.9* 25.5*  PLT 158 196   BMET:  Recent Labs    10/05/18 1428 10/05/18 2144 10/06/18 0509  NA 128* 130* 132*  K 3.8  --  3.6  CL 96*  --  99  CO2 27  --  28  GLUCOSE 147*  --  119*  BUN 11  --  8  CREATININE 0.82  --  0.70  CALCIUM 8.3*  --  8.5*    PT/INR: No results for input(s): LABPROT, INR in the last 72 hours. ABG    Component Value Date/Time   PHART 7.349 (L) 09/30/2018 2016   HCO3 22.1 09/30/2018 2016   TCO2 23 10/01/2018 1649   ACIDBASEDEF 3.0 (H)  09/30/2018 2016   O2SAT 95.0 09/30/2018 2016   CBG (last 3)  Recent Labs    10/05/18 0441 10/05/18 0753 10/05/18 1224  GLUCAP 95 100* 82    Assessment/Plan: S/P Procedure(s) (LRB): CORONARY ARTERY BYPASS GRAFTING (CABG) X THREE, LIMA TO LAD, SVG TO DIAGONAL, SVG TO PDA  USING LEFT INTERNAL MAMMERY ARTERY AND RIGHT AND LEFT GREATER SAPHENOUS VEIN (N/A) TRANSESOPHAGEAL ECHOCARDIOGRAM (TEE) (N/A) LEFT UPPER LOBE WEDGE RESECTION (Left)  POD 6 Hemodynamically stable with low normal BP.  Postop atrial fib: she has had some recurrent atrial fib on amio. I am concerned about amio use with her severe pulmonary disease. I am not going to change the course of therapy that was started by EP and Dr. Servando Snare at this time. I would consider using digoxin and low dose beta blocker or calcium blocker for rate control instead of amio. She is still having some atrial fib on amio and there is risk of worsening of lung disease. Will discuss with Dr. Servando Snare.  Mild volume excess: wt is still few lbs over preop so will diurese further.  Hyponatremia: improving with tolvaptan.  Continue IS, ambulation.  LOS: 10 days    Gaye Pollack 10/06/2018

## 2018-10-06 NOTE — Progress Notes (Signed)
This note also relates to the following rows which could not be included: Dose (mg/hr) Amiodarone - Cannot attach notes to extension rows Rate Amiodarone - Cannot attach notes to extension rows Concentration Amiodarone - Cannot attach notes to extension rows  Manually adjusted to account for time change

## 2018-10-06 NOTE — Plan of Care (Signed)
  Problem: Education: Goal: Knowledge of General Education information will improve Description Including pain rating scale, medication(s)/side effects and non-pharmacologic comfort measures Outcome: Progressing   Problem: Health Behavior/Discharge Planning: Goal: Ability to manage health-related needs will improve Outcome: Progressing   Problem: Clinical Measurements: Goal: Will remain free from infection Outcome: Progressing Goal: Diagnostic test results will improve Outcome: Progressing Goal: Cardiovascular complication will be avoided Outcome: Progressing   Problem: Activity: Goal: Risk for activity intolerance will decrease Outcome: Progressing   Problem: Nutrition: Goal: Adequate nutrition will be maintained Outcome: Progressing   Problem: Coping: Goal: Level of anxiety will decrease Outcome: Progressing   Problem: Elimination: Goal: Will not experience complications related to bowel motility Outcome: Progressing Goal: Will not experience complications related to urinary retention Outcome: Progressing   Problem: Safety: Goal: Ability to remain free from injury will improve Outcome: Progressing   Problem: Skin Integrity: Goal: Risk for impaired skin integrity will decrease Outcome: Progressing   Problem: Education: Goal: Understanding of CV disease, CV risk reduction, and recovery process will improve Outcome: Progressing Goal: Individualized Educational Video(s) Outcome: Progressing   Problem: Cardiovascular: Goal: Ability to achieve and maintain adequate cardiovascular perfusion will improve Outcome: Progressing Goal: Vascular access site(s) Level 0-1 will be maintained Outcome: Progressing   Problem: Education: Goal: Will demonstrate proper wound care and an understanding of methods to prevent future damage Outcome: Progressing Goal: Knowledge of disease or condition will improve Outcome: Progressing Goal: Knowledge of the prescribed therapeutic  regimen will improve Outcome: Progressing Goal: Individualized Educational Video(s) Outcome: Progressing   Problem: Activity: Goal: Risk for activity intolerance will decrease Outcome: Progressing   Problem: Cardiac: Goal: Will achieve and/or maintain hemodynamic stability Outcome: Progressing   Problem: Clinical Measurements: Goal: Postoperative complications will be avoided or minimized Outcome: Progressing   Problem: Skin Integrity: Goal: Wound healing without signs and symptoms of infection Outcome: Progressing Goal: Risk for impaired skin integrity will decrease Outcome: Progressing   Problem: Urinary Elimination: Goal: Ability to achieve and maintain adequate renal perfusion and functioning will improve Outcome: Progressing

## 2018-10-06 NOTE — Progress Notes (Signed)
Progress Note  Patient Name: Sarah Escobar Date of Encounter: 10/06/2018  Primary Cardiologist: Dorris Carnes, MD   Subjective   Unfortunately went back into atrial fibrillation last night at around 2:30 PM.  She does feel palpitations otherwise is feeling well.  Inpatient Medications    Scheduled Meds: . aspirin EC  325 mg Oral Daily   Or  . aspirin  324 mg Per Tube Daily  . atorvastatin  80 mg Oral q1800  . buPROPion  300 mg Oral q morning - 10a  . Chlorhexidine Gluconate Cloth  6 each Topical Daily  . cholecalciferol  2,000 Units Oral Daily  . enoxaparin (LOVENOX) injection  30 mg Subcutaneous QHS  . feeding supplement  1 Container Oral TID BM  . finasteride  2.5 mg Oral Daily  . fluticasone  2 spray Each Nare BID  . fluticasone furoate-vilanterol  1 puff Inhalation Daily  . guaiFENesin  1,200 mg Oral BID  . ipratropium  0.5 mg Nebulization TID  . levalbuterol  1.25 mg Nebulization TID  . loratadine  10 mg Oral Daily  . mouth rinse  15 mL Mouth Rinse BID  . metoprolol tartrate  12.5 mg Oral BID   Or  . metoprolol tartrate  12.5 mg Per Tube BID  . pantoprazole  40 mg Oral Daily  . sodium chloride  2 spray Each Nare BID  . sodium chloride flush  10-40 mL Intracatheter Q12H  . sodium chloride flush  10-40 mL Intracatheter Q12H  . sodium chloride flush  3 mL Intravenous Q12H  . tolvaptan  15 mg Oral Q24H   Continuous Infusions: . sodium chloride Stopped (10/06/18 0651)  . amiodarone 30 mg/hr (10/06/18 0700)  . cefTRIAXone (ROCEPHIN)  IV 2 g (10/05/18 1433)  . potassium chloride 50 mL/hr at 10/06/18 0700   PRN Meds: HYDROcodone-acetaminophen, ipratropium, levalbuterol, LORazepam, metoprolol tartrate, ondansetron (ZOFRAN) IV, sodium chloride flush, sodium chloride flush, sodium chloride flush, traMADol   Vital Signs    Vitals:   10/06/18 0432 10/06/18 0500 10/06/18 0600 10/06/18 0700  BP:  (!) 89/65 (!) 96/56 107/72  Pulse:  (!) 109 (!) 119 (!) 126  Resp: (!) 25  (!) 22 (!) 24 20  Temp:    97.7 F (36.5 C)  TempSrc:    Oral  SpO2:  97% 97% 97%  Weight:  65 kg    Height:        Intake/Output Summary (Last 24 hours) at 10/06/2018 0838 Last data filed at 10/06/2018 0700 Gross per 24 hour  Intake 1324.28 ml  Output 2076 ml  Net -751.72 ml   Filed Weights   10/04/18 0500 10/05/18 0500 10/06/18 0500  Weight: 65.7 kg 66.4 kg 65 kg    Telemetry    Atrial fibrillation personally Reviewed  ECG    None new- Personally Reviewed  Physical Exam   VS:  BP 107/72   Pulse (!) 126   Temp 97.7 F (36.5 C) (Oral)   Resp 20   Ht 5\' 6"  (1.676 m)   Wt 65 kg   SpO2 97%   BMI 23.13 kg/m  , BMI Body mass index is 23.13 kg/m. GEN: Ill-appearing HEENT: normal  Neck: no JVD, carotid bruits, or masses Cardiac: Irregularly irregular; no murmurs, rubs, or gallops,no edema  Respiratory: Crackles throughout, normal work of breathing GI: soft, nontender, nondistended, + BS MS: no deformity or atrophy  Skin: warm and dry Neuro:  Strength and sensation are intact Psych: euthymic mood, full affect  Labs    Chemistry Recent Labs  Lab 10/05/18 0454 10/05/18 1428 10/05/18 2144 10/06/18 0509  NA 126* 128* 130* 132*  K 3.9 3.8  --  3.6  CL 96* 96*  --  99  CO2 27 27  --  28  GLUCOSE 136* 147*  --  119*  BUN 14 11  --  8  CREATININE 0.87 0.82  --  0.70  CALCIUM 8.0* 8.3*  --  8.5*  GFRNONAA >60 >60  --  >60  GFRAA >60 >60  --  >60  ANIONGAP 3* 5  --  5     Hematology Recent Labs  Lab 10/03/18 0756 10/04/18 0532 10/05/18 0454  WBC 13.6* 11.9* 11.3*  RBC 2.82* 2.65* 2.63*  HGB 9.1* 8.7* 8.5*  HCT 27.8* 25.9* 25.5*  MCV 98.6 97.7 97.0  MCH 32.3 32.8 32.3  MCHC 32.7 33.6 33.3  RDW 12.6 12.6 12.5  PLT 128* 158 196    Cardiac Enzymes No results for input(s): TROPONINI in the last 168 hours.  No results for input(s): TROPIPOC in the last 168 hours.   BNPNo results for input(s): BNP, PROBNP in the last 168 hours.   DDimer  No  results for input(s): DDIMER in the last 168 hours.   Radiology    Dg Chest Port 1 View  Result Date: 10/06/2018 CLINICAL DATA:  History of CABG. EXAM: PORTABLE CHEST 1 VIEW COMPARISON:  10/05/2018 FINDINGS: Postsurgical changes from CABG, stable. Right PICC line also in stable position. Enlarged cardiac silhouette. Diffuse interstitial and alveolar airspace opacities throughout both lungs, superimposed on previously demonstrated chronic interstitial lung changes. Osseous structures are without acute abnormality. Soft tissues are grossly normal. IMPRESSION: Interval development of diffuse interstitial and alveolar airspace opacities throughout both lungs, superimposed on previously demonstrated chronic interstitial lung changes. Findings concerning for developing of pulmonary edema, or atypical pneumonia. Electronically Signed   By: Fidela Salisbury M.D.   On: 10/06/2018 07:30   Dg Chest Port 1 View  Result Date: 10/05/2018 CLINICAL DATA:  CABG 5 days ago. EXAM: PORTABLE CHEST 1 VIEW COMPARISON:  One-view chest x-ray 10/04/2018 FINDINGS: The heart size is normal. Lung volumes are low. A right-sided PICC line is stable. The interstitial pattern is stable, predominantly underlying fibrotic change. Mild bibasilar atelectasis is superimposed. IMPRESSION: 1. Persistent low lung volumes and mild bibasilar atelectasis. 2. Underlying pulmonary fibrosis. Electronically Signed   By: San Morelle M.D.   On: 10/05/2018 07:46    Cardiac Studies   Echo 09/27/18: Study Conclusions  - Left ventricle: The cavity size was normal. There was mild focal   basal hypertrophy of the septum. Systolic function was normal.   The estimated ejection fraction was in the range of 55% to 60%.   There is hypokinesis of the mid septal myocardium. Doppler   parameters are consistent with abnormal left ventricular   relaxation (grade 1 diastolic dysfunction).  Impressions:  - Septal hypokinesis with overall  preserved LV systolic function;   mild diastolic dysfunction; sclerotic aortic valve.  LHC 09/26/18: Conclusions: 1. Diffuse, heavy calcification of the coronary arteries with significant 2-vessel CAD.  There is an 80% ostial/proximal LAD stenosis as well as an eccentric 70% mid RCA lesion. 2. Normal left ventricular systolic function and filling pressure.  Recommendations: 1. Images reviewed with Dr. Ellyn Hack.  We both agree that given heavy calcification and ostial disease of the LAD would be best suited for bypass.  PCI would be high risk, requiring atherectomy and stenting back  into the distal LMCA.  Cardiac surgery has been contacted for consultation. 2. Restart heparin infusion 2 hours after TR band removal. 3. Metoprolol and IV nitroglycerin for antianginal therapy. 4. Aggressive secondary prevention. 5. Obtain transthoracic echocardiogram.  Patient Profile     62 y.o. female with hypertension, hyperlipidemia, ILD, bronchiectasis,   Assessment & Plan    # NSTEMI: # 2 vessel CAD:  Underwent CABG 10/01/2018 with a LIMA to the LAD, vein to the diagonal, and vein to the RCA.  Continue aspirin, metoprolol, atorvastatin.    # Post-operative atrial fibrillation: Unfortunately she went back into atrial fibrillation last night.  She does feel palpitations, but otherwise without major complaint.  Her blood pressure is borderline today and thus I would hesitate to add any further rate controlling medications.  Would continue IV amiodarone at this time.   This patients CHA2DS2-VASc Score and unadjusted Ischemic Stroke Rate (% per year) is equal to 3.2 % stroke rate/year from a score of 3  Above score calculated as 1 point each if present [CHF, HTN, DM, Vascular=MI/PAD/Aortic Plaque, Age if 65-74, or Female] Above score calculated as 2 points each if present [Age > 75, or Stroke/TIA/TE]        For questions or updates, please contact [facility] Please consult [email or web address] for  contact info under        Signed, Tiondra Fang Meredith Leeds, MD  10/06/2018, 8:38 AM

## 2018-10-07 ENCOUNTER — Inpatient Hospital Stay (HOSPITAL_COMMUNITY): Payer: 59

## 2018-10-07 ENCOUNTER — Encounter (HOSPITAL_COMMUNITY): Payer: Self-pay | Admitting: Cardiology

## 2018-10-07 LAB — BASIC METABOLIC PANEL
Anion gap: 7 (ref 5–15)
BUN: 9 mg/dL (ref 8–23)
CHLORIDE: 98 mmol/L (ref 98–111)
CO2: 28 mmol/L (ref 22–32)
Calcium: 8.4 mg/dL — ABNORMAL LOW (ref 8.9–10.3)
Creatinine, Ser: 0.75 mg/dL (ref 0.44–1.00)
GFR calc Af Amer: >60 mL/min (ref >60)
GFR calc non Af Amer: >60 mL/min (ref >60)
Glucose, Bld: 115 mg/dL — ABNORMAL HIGH (ref 70–99)
POTASSIUM: 3.9 mmol/L (ref 3.5–5.1)
SODIUM: 133 mmol/L — AB (ref 135–145)

## 2018-10-07 LAB — CBC
HEMATOCRIT: 24.5 % — AB (ref 36.0–46.0)
Hemoglobin: 7.7 g/dL — ABNORMAL LOW (ref 12.0–15.0)
MCH: 31.3 pg (ref 26.0–34.0)
MCHC: 31.4 g/dL (ref 30.0–36.0)
MCV: 99.6 fL (ref 80.0–100.0)
Platelets: 299 10*3/uL (ref 150–400)
RBC: 2.46 MIL/uL — ABNORMAL LOW (ref 3.87–5.11)
RDW: 12.6 % (ref 11.5–15.5)
WBC: 11.9 10*3/uL — AB (ref 4.0–10.5)
nRBC: 0 % (ref 0.0–0.2)

## 2018-10-07 LAB — GLUCOSE, CAPILLARY: Glucose-Capillary: 118 mg/dL — ABNORMAL HIGH (ref 70–99)

## 2018-10-07 MED ORDER — AMIODARONE HCL 200 MG PO TABS
400.0000 mg | ORAL_TABLET | Freq: Two times a day (BID) | ORAL | Status: DC
  Administered 2018-10-07 (×2): 400 mg via ORAL
  Filled 2018-10-07 (×2): qty 2

## 2018-10-07 MED ORDER — METOPROLOL TARTRATE 25 MG/10 ML ORAL SUSPENSION: 12.5000 mg | Freq: Two times a day (BID) | ORAL | Status: DC

## 2018-10-07 MED ORDER — FUROSEMIDE 10 MG/ML IJ SOLN
40.0000 mg | Freq: Once | INTRAMUSCULAR | Status: AC
  Administered 2018-10-07: 40 mg via INTRAVENOUS
  Filled 2018-10-07: qty 4

## 2018-10-07 MED ORDER — METOPROLOL TARTRATE 25 MG PO TABS
25.0000 mg | ORAL_TABLET | Freq: Two times a day (BID) | ORAL | Status: DC
  Administered 2018-10-07 – 2018-10-09 (×6): 25 mg via ORAL
  Filled 2018-10-07 (×6): qty 1

## 2018-10-07 NOTE — Progress Notes (Signed)
Progress Note  Patient Name: Sarah Escobar Date of Encounter: 10/07/2018  Primary Cardiologist: Dorris Carnes, MD   Subjective   Dyspnea improving; chest sore  Inpatient Medications    Scheduled Meds: . aspirin EC  325 mg Oral Daily   Or  . aspirin  324 mg Per Tube Daily  . atorvastatin  80 mg Oral q1800  . buPROPion  300 mg Oral q morning - 10a  . Chlorhexidine Gluconate Cloth  6 each Topical Daily  . cholecalciferol  2,000 Units Oral Daily  . enoxaparin (LOVENOX) injection  30 mg Subcutaneous QHS  . feeding supplement  1 Container Oral TID BM  . finasteride  2.5 mg Oral Daily  . fluticasone  2 spray Each Nare BID  . fluticasone furoate-vilanterol  1 puff Inhalation Daily  . guaiFENesin  1,200 mg Oral BID  . ipratropium  0.5 mg Nebulization TID  . levalbuterol  1.25 mg Nebulization TID  . loratadine  10 mg Oral Daily  . mouth rinse  15 mL Mouth Rinse BID  . metoprolol tartrate  12.5 mg Oral BID   Or  . metoprolol tartrate  12.5 mg Per Tube BID  . pantoprazole  40 mg Oral Daily  . sodium chloride  2 spray Each Nare BID  . sodium chloride flush  10-40 mL Intracatheter Q12H  . sodium chloride flush  10-40 mL Intracatheter Q12H  . sodium chloride flush  3 mL Intravenous Q12H  . tolvaptan  15 mg Oral Q24H   Continuous Infusions: . sodium chloride Stopped (10/07/18 0653)  . amiodarone 30 mg/hr (10/07/18 0700)  . cefTRIAXone (ROCEPHIN)  IV Stopped (10/06/18 1559)   PRN Meds: HYDROcodone-acetaminophen, ipratropium, levalbuterol, LORazepam, metoprolol tartrate, ondansetron (ZOFRAN) IV, sodium chloride flush, sodium chloride flush, sodium chloride flush, traMADol   Vital Signs    Vitals:   10/07/18 0450 10/07/18 0500 10/07/18 0600 10/07/18 0700  BP: 99/74 (!) 101/53 92/79 132/72  Pulse: 83 83 82 87  Resp: 19 18 14 14   Temp:      TempSrc:      SpO2: 95% 100% 97% 93%  Weight:  66.1 kg    Height:        Intake/Output Summary (Last 24 hours) at 10/07/2018 0730 Last  data filed at 10/07/2018 0700 Gross per 24 hour  Intake 1367.09 ml  Output 1625 ml  Net -257.91 ml   Filed Weights   10/05/18 0500 10/06/18 0500 10/07/18 0500  Weight: 66.4 kg 65 kg 66.1 kg    Telemetry    Atrial fibrillation converting to sinus - Personally Reviewed   Physical Exam   GEN: No acute distress.   Neck: No JVD Cardiac: RRR, no murmurs Respiratory: Dry crackles GI: Soft, nontender, non-distended  MS: 1+ edemA Neuro:  Nonfocal  Psych: Normal affect   Labs    Chemistry Recent Labs  Lab 10/05/18 1428  10/06/18 0509 10/06/18 1522 10/07/18 0555  NA 128*   < > 132* 134* 133*  K 3.8  --  3.6  --  3.9  CL 96*  --  99  --  98  CO2 27  --  28  --  28  GLUCOSE 147*  --  119*  --  115*  BUN 11  --  8  --  9  CREATININE 0.82  --  0.70  --  0.75  CALCIUM 8.3*  --  8.5*  --  8.4*  GFRNONAA >60  --  >60  --  >60  GFRAA >60  --  >60  --  >60  ANIONGAP 5  --  5  --  7   < > = values in this interval not displayed.     Hematology Recent Labs  Lab 10/04/18 0532 10/05/18 0454 10/07/18 0555  WBC 11.9* 11.3* 11.9*  RBC 2.65* 2.63* 2.46*  HGB 8.7* 8.5* 7.7*  HCT 25.9* 25.5* 24.5*  MCV 97.7 97.0 99.6  MCH 32.8 32.3 31.3  MCHC 33.6 33.3 31.4  RDW 12.6 12.5 12.6  PLT 158 196 299    Radiology    Dg Chest Port 1 View  Result Date: 10/06/2018 CLINICAL DATA:  History of CABG. EXAM: PORTABLE CHEST 1 VIEW COMPARISON:  10/05/2018 FINDINGS: Postsurgical changes from CABG, stable. Right PICC line also in stable position. Enlarged cardiac silhouette. Diffuse interstitial and alveolar airspace opacities throughout both lungs, superimposed on previously demonstrated chronic interstitial lung changes. Osseous structures are without acute abnormality. Soft tissues are grossly normal. IMPRESSION: Interval development of diffuse interstitial and alveolar airspace opacities throughout both lungs, superimposed on previously demonstrated chronic interstitial lung changes. Findings  concerning for developing of pulmonary edema, or atypical pneumonia. Electronically Signed   By: Fidela Salisbury M.D.   On: 10/06/2018 07:30    Patient Profile     62 y.o. female with past medical history of interstitial lung disease admitted with non-ST elevation myocardial infarction.  Cardiac catheterization revealed ostial LAD and mid RCA lesion.  Normal LV function.  Echocardiogram showed normal LV function.  Patient is now status post coronary artery bypass and graft and left upper lobe wedge resection.  Assessment & Plan    1 coronary artery disease status post coronary artery bypass and graft/non-ST elevation myocardial infarction-continue aspirin and statin.  2 postoperative atrial fibrillation-patient has had recurrent postoperative atrial fibrillation but is in sinus this morning.  Given baseline lung disease I would like to avoid amiodarone long-term.  However I feel it would be appropriate to use in the short-term.  Hopefully she will be more likely to hold sinus rhythm the further removed from surgery.  Change amiodarone to 400 mg by mouth twice daily.  Decrease to 200 mg daily at discharge.  If she hold sinus rhythm can then discontinue amiodarone 8 weeks following discharge.  We would then follow for recurrent atrial arrhythmias. Increase metoprolol to 25 mg twice daily.CHADSvasc 3.  She would benefit from anticoagulation if she can tolerate from a pulmonary standpoint (however patient did have hemoptysis based on previous notes).  We will continue aspirin for now.    3 interstitial lung disease-managed by pulmonary.  4 postoperative volume excess-diuresis as tolerated.  Follow renal function.  For questions or updates, please contact [facility] Please consult [email or web address] for contact info under        Signed, Kirk Ruths, MD  10/07/2018, 7:30 AM

## 2018-10-07 NOTE — Plan of Care (Signed)
  Problem: Education: Goal: Knowledge of General Education information will improve Description Including pain rating scale, medication(s)/side effects and non-pharmacologic comfort measures Outcome: Progressing   Problem: Clinical Measurements: Goal: Ability to maintain clinical measurements within normal limits will improve Outcome: Progressing Goal: Diagnostic test results will improve Outcome: Progressing Goal: Respiratory complications will improve Outcome: Progressing   Problem: Activity: Goal: Risk for activity intolerance will decrease 10/07/2018 1004 by Hillard Danker, RN Outcome: Progressing 10/07/2018 0957 by Hillard Danker, RN Outcome: Progressing   Problem: Nutrition: Goal: Adequate nutrition will be maintained Outcome: Progressing   Problem: Coping: Goal: Level of anxiety will decrease Outcome: Progressing

## 2018-10-07 NOTE — Progress Notes (Signed)
RT NOTES: Order from Dr Elsworth Soho to decrease fio2. Patient is nervous about decreasing o2 stating she thought the doctor meant to go UP on her oxygen. Spoke with Dr Elsworth Soho again who stated her oxygen needed to come down. Husband at bedside and also nervous about the change. At this time sats are 97%. Decreased o2 from 15L to 12L. Explained to patient and husband order from MD and that patient was continuing to be carefully monitored for any changes. Patient agreed. Will continue to monitor.

## 2018-10-07 NOTE — Progress Notes (Signed)
Patient ID: [facility], female   DOB: 1956-06-02, 62 y.o.   MRN: [id number] EVENING ROUNDS NOTE :     [address]       [place] [address]             [phone]                 7 Days Post-Op Procedure(s) (LRB): CORONARY ARTERY BYPASS GRAFTING (CABG) X THREE, LIMA TO LAD, SVG TO DIAGONAL, SVG TO PDA  USING LEFT INTERNAL MAMMERY ARTERY AND RIGHT AND LEFT GREATER SAPHENOUS VEIN (N/A) TRANSESOPHAGEAL ECHOCARDIOGRAM (TEE) (N/A) LEFT UPPER LOBE WEDGE RESECTION (Left)  Total Length of Stay:  LOS: 11 days  BP (!) 94/55   Pulse 91   Temp 97.9 F (36.6 C) (Oral)   Resp (!) 23   Ht 5\' 6"  (1.676 m)   Wt 66.1 kg   SpO2 95%   BMI 23.52 kg/m   .Intake/Output      11/03 0701 - 11/04 0700 11/04 0701 - 11/05 0700   P.O. 480 360   I.V. (mL/kg) 606.1 (9.2) 30.1 (0.5)   IV Piggyback 281 0   Total Intake(mL/kg) 1367.1 (20.7) 390.1 (5.9)   Urine (mL/kg/hr) 1625 (1) 100 (0.2)   Stool     Total Output 1625 100   Net -257.9 +290.1        Urine Occurrence 2 x      . sodium chloride Stopped (10/07/18 0653)  . cefTRIAXone (ROCEPHIN)  IV 2 g (10/07/18 1417)     Lab Results  Component Value Date   WBC 11.9 (H) 10/07/2018   HGB 7.7 (L) 10/07/2018   HCT 24.5 (L) 10/07/2018   PLT 299 10/07/2018   GLUCOSE 115 (H) 10/07/2018   CHOL 120 09/27/2018   TRIG 68 09/27/2018   HDL 36 (L) 09/27/2018   LDLCALC 70 09/27/2018   ALT 40 09/26/2018   AST 46 (H) 09/26/2018   NA 133 (L) 10/07/2018   K 3.9 10/07/2018   CL 98 10/07/2018   CREATININE 0.75 10/07/2018   BUN 9 10/07/2018   CO2 28 10/07/2018   TSH 1.676 09/30/2018   INR 1.43 09/30/2018   HGBA1C 5.3 09/30/2018   Holding sinus Walking in hall but slow    [name] MD  Beeper [phone] Office [phone] 10/07/2018 4:30 PM

## 2018-10-07 NOTE — Progress Notes (Addendum)
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 She developed postoperative atrial fibrillation She was increasingly hypoxic over the weekend now requiring high flow nasal cannula about 12 L. Able to ambulate but is significantly short of breath after.  On exam-bibasal scattered crackles, no rhonchi, S1-S2 and regular monitor, 1+ edema, sternotomy and leg wounds healing well.  Chest x-ray reviewed which showed bilateral interstitial infiltrates, no atelectasis. Mild leukocytosis persists, hemoglobin is down to 7.7.  Impression/plan Postop CABG, hypoxia-spoke to RT, part of it seems to be that patient will not lead RT decrease FiO2, I spoke to her and she would be agreeable. 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 No evidence of collagen vascular disease.  Atrial fibrillation/RVR-Per cardiology, not a candidate for long-term amiodarone  Sarah Escobar V. Elsworth Soho MD [phone]

## 2018-10-07 NOTE — Progress Notes (Signed)
Patient ID: Sarah Escobar, female   DOB: 1956-05-02, 62 y.o.   MRN: [id number] [facility] DAILY ICU PROGRESS NOTE                   [address]            [place] [address]          [phone]   7 Days Post-Op Procedure(s) (LRB): CORONARY ARTERY BYPASS GRAFTING (CABG) X THREE, LIMA TO LAD, SVG TO DIAGONAL, SVG TO PDA  USING LEFT INTERNAL MAMMERY ARTERY AND RIGHT AND LEFT GREATER SAPHENOUS VEIN (N/A) TRANSESOPHAGEAL ECHOCARDIOGRAM (TEE) (N/A) LEFT UPPER LOBE WEDGE RESECTION (Left)  Total Length of Stay:  LOS: 11 days   Subjective: Patient notes breathing is slightly better this morning, still on high flow nasal cannula, in sinus rhythm 92 this morning  Objective: Vital signs in last 24 hours: Temp:  [97.6 F (36.4 C)-98.2 F (36.8 C)] 98.1 F (36.7 C) (11/04 0400) Pulse Rate:  [50-179] 82 (11/04 0600) Cardiac Rhythm: Normal sinus rhythm (11/03 2000) Resp:  [14-34] 14 (11/04 0600) BP: (88-139)/(50-91) 92/79 (11/04 0600) SpO2:  [91 %-100 %] 97 % (11/04 0600) Weight:  [66.1 kg] 66.1 kg (11/04 0500)  Filed Weights   10/05/18 0500 10/06/18 0500 10/07/18 0500  Weight: 66.4 kg 65 kg 66.1 kg    Weight change: 1.089 kg   Hemodynamic parameters for last 24 hours:    Intake/Output from previous day: 11/03 0701 - 11/04 0700 In: 1341.6 [P.O.:480; I.V.:580.6; IV Piggyback:281] Out: 1625 [Urine:1625]  Intake/Output this shift: Total I/O In: 778.6 [P.O.:360; I.V.:418.6] Out: 300 [Urine:300]  Current Meds: Scheduled Meds: . aspirin EC  325 mg Oral Daily   Or  . aspirin  324 mg Per Tube Daily  . atorvastatin  80 mg Oral q1800  . buPROPion  300 mg Oral q morning - 10a  . Chlorhexidine Gluconate Cloth  6 each Topical Daily  . cholecalciferol  2,000 Units Oral Daily  . enoxaparin (LOVENOX) injection  30 mg Subcutaneous QHS  . feeding supplement  1 Container Oral TID BM  . finasteride  2.5 mg Oral Daily  . fluticasone  2 spray Each Nare BID  . fluticasone  furoate-vilanterol  1 puff Inhalation Daily  . guaiFENesin  1,200 mg Oral BID  . ipratropium  0.5 mg Nebulization TID  . levalbuterol  1.25 mg Nebulization TID  . loratadine  10 mg Oral Daily  . mouth rinse  15 mL Mouth Rinse BID  . metoprolol tartrate  12.5 mg Oral BID   Or  . metoprolol tartrate  12.5 mg Per Tube BID  . pantoprazole  40 mg Oral Daily  . sodium chloride  2 spray Each Nare BID  . sodium chloride flush  10-40 mL Intracatheter Q12H  . sodium chloride flush  10-40 mL Intracatheter Q12H  . sodium chloride flush  3 mL Intravenous Q12H  . tolvaptan  15 mg Oral Q24H   Continuous Infusions: . sodium chloride 10 mL/hr at 10/07/18 0600  . amiodarone 30 mg/hr (10/07/18 0600)  . cefTRIAXone (ROCEPHIN)  IV Stopped (10/06/18 1559)   PRN Meds:.HYDROcodone-acetaminophen, ipratropium, levalbuterol, LORazepam, metoprolol tartrate, ondansetron (ZOFRAN) IV, sodium chloride flush, sodium chloride flush, sodium chloride flush, traMADol  General appearance: alert, cooperative and fatigued Neurologic: intact Heart: regular rate and rhythm, S1, S2 normal, no murmur, click, rub or gallop Lungs: diminished breath sounds bilaterally Abdomen: soft, non-tender; bowel sounds normal; no masses,  no organomegaly Extremities: extremities normal,  atraumatic, no cyanosis or edema and Homans sign is negative, no sign of DVT Wound: Sternum stable  Lab Results: CBC: Recent Labs    10/05/18 0454 10/07/18 0555  WBC 11.3* 11.9*  HGB 8.5* 7.7*  HCT 25.5* 24.5*  PLT 196 299   BMET:  Recent Labs    10/05/18 1428  10/06/18 0509 10/06/18 1522  NA 128*   < > 132* 134*  K 3.8  --  3.6  --   CL 96*  --  99  --   CO2 27  --  28  --   GLUCOSE 147*  --  119*  --   BUN 11  --  8  --   CREATININE 0.82  --  0.70  --   CALCIUM 8.3*  --  8.5*  --    < > = values in this interval not displayed.    CMET: Lab Results  Component Value Date   WBC 11.9 (H) 10/07/2018   HGB 7.7 (L) 10/07/2018   HCT  24.5 (L) 10/07/2018   PLT 299 10/07/2018   GLUCOSE 119 (H) 10/06/2018   CHOL 120 09/27/2018   TRIG 68 09/27/2018   HDL 36 (L) 09/27/2018   LDLCALC 70 09/27/2018   ALT 40 09/26/2018   AST 46 (H) 09/26/2018   NA 134 (L) 10/06/2018   K 3.6 10/06/2018   CL 99 10/06/2018   CREATININE 0.70 10/06/2018   BUN 8 10/06/2018   CO2 28 10/06/2018   TSH 1.676 09/30/2018   INR 1.43 09/30/2018   HGBA1C 5.3 09/30/2018      PT/INR: No results for input(s): LABPROT, INR in the last 72 hours. Radiology: No results found.   Assessment/Plan: S/P Procedure(s) (LRB): CORONARY ARTERY BYPASS GRAFTING (CABG) X THREE, LIMA TO LAD, SVG TO DIAGONAL, SVG TO PDA  USING LEFT INTERNAL MAMMERY ARTERY AND RIGHT AND LEFT GREATER SAPHENOUS VEIN (N/A) TRANSESOPHAGEAL ECHOCARDIOGRAM (TEE) (N/A) LEFT UPPER LOBE WEDGE RESECTION (Left) With the patient's marginal lung function preoperatively will ask CCM to continue to follow the patient, was not seen by pulmonary yesterday Patient currently in sinus rhythm, will need a comprehensive plan from EP and cardiology to avoid amnio at discharge Patient is a poor candidate for anticoagulation     [name] 10/07/2018 6:44 AM

## 2018-10-07 NOTE — Plan of Care (Signed)
  Problem: Education: Goal: Knowledge of General Education information will improve Description: Including pain rating scale, medication(s)/side effects and non-pharmacologic comfort measures Outcome: Progressing   Problem: Clinical Measurements: Goal: Diagnostic test results will improve Outcome: Progressing   Problem: Activity: Goal: Risk for activity intolerance will decrease Outcome: Progressing   

## 2018-10-08 ENCOUNTER — Ambulatory Visit: Payer: 59 | Admitting: Internal Medicine

## 2018-10-08 ENCOUNTER — Inpatient Hospital Stay (HOSPITAL_COMMUNITY): Payer: 59

## 2018-10-08 DIAGNOSIS — J9621 Acute and chronic respiratory failure with hypoxia: Secondary | ICD-10-CM

## 2018-10-08 DIAGNOSIS — J189 Pneumonia, unspecified organism: Secondary | ICD-10-CM

## 2018-10-08 LAB — CBC
HCT: 23.7 % — ABNORMAL LOW (ref 36.0–46.0)
Hemoglobin: 7.6 g/dL — ABNORMAL LOW (ref 12.0–15.0)
MCH: 31.4 pg (ref 26.0–34.0)
MCHC: 32.1 g/dL (ref 30.0–36.0)
MCV: 97.9 fL (ref 80.0–100.0)
Platelets: 349 10*3/uL (ref 150–400)
RBC: 2.42 MIL/uL — ABNORMAL LOW (ref 3.87–5.11)
RDW: 12.5 % (ref 11.5–15.5)
WBC: 13.4 10*3/uL — ABNORMAL HIGH (ref 4.0–10.5)
nRBC: 0 % (ref 0.0–0.2)

## 2018-10-08 LAB — BASIC METABOLIC PANEL
Anion gap: 6 (ref 5–15)
BUN: 10 mg/dL (ref 8–23)
CO2: 27 mmol/L (ref 22–32)
Calcium: 8.5 mg/dL — ABNORMAL LOW (ref 8.9–10.3)
Chloride: 96 mmol/L — ABNORMAL LOW (ref 98–111)
Creatinine, Ser: 0.85 mg/dL (ref 0.44–1.00)
GFR calc Af Amer: >60 mL/min (ref >60)
GFR calc non Af Amer: >60 mL/min (ref >60)
Glucose, Bld: 108 mg/dL — ABNORMAL HIGH (ref 70–99)
Potassium: 4 mmol/L (ref 3.5–5.1)
Sodium: 129 mmol/L — ABNORMAL LOW (ref 135–145)

## 2018-10-08 MED ORDER — FUROSEMIDE 10 MG/ML IJ SOLN
40.0000 mg | Freq: Two times a day (BID) | INTRAMUSCULAR | Status: DC
  Administered 2018-10-08 – 2018-10-11 (×7): 40 mg via INTRAVENOUS
  Filled 2018-10-08 (×7): qty 4

## 2018-10-08 MED ORDER — METHYLPREDNISOLONE SODIUM SUCC 40 MG IJ SOLR
40.0000 mg | Freq: Two times a day (BID) | INTRAMUSCULAR | Status: DC
  Administered 2018-10-08 – 2018-10-09 (×2): 40 mg via INTRAVENOUS
  Filled 2018-10-08 (×2): qty 1

## 2018-10-08 MED ORDER — FUROSEMIDE 10 MG/ML IJ SOLN
40.0000 mg | Freq: Once | INTRAMUSCULAR | Status: AC
  Administered 2018-10-08: 40 mg via INTRAVENOUS
  Filled 2018-10-08: qty 4

## 2018-10-08 NOTE — Progress Notes (Addendum)
Patient ID: Sarah Escobar, female   DOB: 08-May-1956, 62 y.o.   MRN: [id number] T[facility] DAILY ICU PROGRESS NOTE                   [address]            [place] [address]          [phone]   8 Days Post-Op Procedure(s) (LRB): CORONARY ARTERY BYPASS GRAFTING (CABG) X THREE, LIMA TO LAD, SVG TO DIAGONAL, SVG TO PDA  USING LEFT INTERNAL MAMMERY ARTERY AND RIGHT AND LEFT GREATER SAPHENOUS VEIN (N/A) TRANSESOPHAGEAL ECHOCARDIOGRAM (TEE) (N/A) LEFT UPPER LOBE WEDGE RESECTION (Left)  Total Length of Stay:  LOS: 12 days   Subjective: Patient up to chair, slightly more dyspneic this morning, anxious especially about not making progress She is holding sinus rhythm  Objective: Vital signs in last 24 hours: Temp:  [97.5 F (36.4 C)-98.4 F (36.9 C)] 97.5 F (36.4 C) (11/05 0400) Pulse Rate:  [78-110] 93 (11/05 0642) Cardiac Rhythm: Sinus tachycardia (11/04 2000) Resp:  [12-38] 24 (11/05 0700) BP: (86-132)/(47-98) 119/62 (11/05 0700) SpO2:  [65 %-100 %] 90 % (11/05 0642) Weight:  [66.4 kg] 66.4 kg (11/05 0546)  Filed Weights   10/06/18 0500 10/07/18 0500 10/08/18 0546  Weight: 65 kg 66.1 kg 66.4 kg    Weight change: 0.311 kg   Hemodynamic parameters for last 24 hours:    Intake/Output from previous day: 11/04 0701 - 11/05 0700 In: 810.1 [P.O.:780; I.V.:30.1] Out: 500 [Urine:500]  Intake/Output this shift: No intake/output data recorded.  Current Meds: Scheduled Meds: . amiodarone  400 mg Oral BID  . aspirin EC  325 mg Oral Daily   Or  . aspirin  324 mg Per Tube Daily  . atorvastatin  80 mg Oral q1800  . buPROPion  300 mg Oral q morning - 10a  . Chlorhexidine Gluconate Cloth  6 each Topical Daily  . cholecalciferol  2,000 Units Oral Daily  . enoxaparin (LOVENOX) injection  30 mg Subcutaneous QHS  . feeding supplement  1 Container Oral TID BM  . finasteride  2.5 mg Oral Daily  . fluticasone  2 spray Each Nare BID  . fluticasone furoate-vilanterol  1  puff Inhalation Daily  . guaiFENesin  1,200 mg Oral BID  . ipratropium  0.5 mg Nebulization TID  . levalbuterol  1.25 mg Nebulization TID  . loratadine  10 mg Oral Daily  . mouth rinse  15 mL Mouth Rinse BID  . metoprolol tartrate  25 mg Oral BID   Or  . metoprolol tartrate  12.5 mg Per Tube BID  . pantoprazole  40 mg Oral Daily  . sodium chloride  2 spray Each Nare BID  . sodium chloride flush  10-40 mL Intracatheter Q12H  . sodium chloride flush  10-40 mL Intracatheter Q12H  . sodium chloride flush  3 mL Intravenous Q12H   Continuous Infusions: . sodium chloride Stopped (10/07/18 0653)  . cefTRIAXone (ROCEPHIN)  IV 2 g (10/07/18 1417)   PRN Meds:.HYDROcodone-acetaminophen, ipratropium, levalbuterol, LORazepam, metoprolol tartrate, ondansetron (ZOFRAN) IV, sodium chloride flush, sodium chloride flush, sodium chloride flush, traMADol  General appearance: alert, cooperative and fatigued Neurologic: intact Heart: regular rate and rhythm, S1, S2 normal, no murmur, click, rub or gallop Lungs: Crackles at the base bilaterally Abdomen: soft, non-tender; bowel sounds normal; no masses,  no organomegaly Extremities: extremities normal, atraumatic, no cyanosis or edema and Homans sign is negative, no sign of DVT Wound:  Sternum stable  Lab Results: CBC: Recent Labs    10/07/18 0555 10/08/18 0423  WBC 11.9* 13.4*  HGB 7.7* 7.6*  HCT 24.5* 23.7*  PLT 299 349   BMET:  Recent Labs    10/07/18 0555 10/08/18 0423  NA 133* 129*  K 3.9 4.0  CL 98 96*  CO2 28 27  GLUCOSE 115* 108*  BUN 9 10  CREATININE 0.75 0.85  CALCIUM 8.4* 8.5*    CMET: Lab Results  Component Value Date   WBC 13.4 (H) 10/08/2018   HGB 7.6 (L) 10/08/2018   HCT 23.7 (L) 10/08/2018   PLT 349 10/08/2018   GLUCOSE 108 (H) 10/08/2018   CHOL 120 09/27/2018   TRIG 68 09/27/2018   HDL 36 (L) 09/27/2018   LDLCALC 70 09/27/2018   ALT 40 09/26/2018   AST 46 (H) 09/26/2018   NA 129 (L) 10/08/2018   K 4.0  10/08/2018   CL 96 (L) 10/08/2018   CREATININE 0.85 10/08/2018   BUN 10 10/08/2018   CO2 27 10/08/2018   TSH 1.676 09/30/2018   INR 1.43 09/30/2018   HGBA1C 5.3 09/30/2018      PT/INR: No results for input(s): LABPROT, INR in the last 72 hours. Radiology: Dg Chest Port 1 View  Result Date: 10/08/2018 CLINICAL DATA:  Shortness of breath. EXAM: PORTABLE CHEST 1 VIEW COMPARISON:  Radiograph October 07, 2018. FINDINGS: Stable cardiomediastinal silhouette. Status post coronary artery bypass graft. No pneumothorax is noted. Stable bilateral lung opacities are noted concerning for edema or pneumonia. Minimal bilateral pleural effusions may be present. Right-sided PICC line is unchanged in position. Bony thorax is unremarkable. IMPRESSION: Stable bilateral lung opacities are noted concerning for edema or possibly pneumonia. Electronically Signed   By: Marijo Conception, M.D.   On: 10/08/2018 07:16   Lung BX Diagnosis Lung, wedge biopsy/resection, left upper lobe - CHRONIC INTERSTITIAL PNEUMONIA, SUGGESTIVE, BUT NOT DIAGNOSTIC OF NON-SPECIFIC INTERSTITIAL PNEUMONIA (NSIP)-LIKE PATTERN (SEE NOTE)  Results for orders placed or performed during the hospital encounter of 09/26/18  MRSA PCR Screening     Status: None   Collection Time: 09/26/18  6:37 PM  Result Value Ref Range Status   MRSA by PCR NEGATIVE NEGATIVE Final    Comment:        The GeneXpert MRSA Assay (FDA approved for NASAL specimens only), is one component of a comprehensive MRSA colonization surveillance program. It is not intended to diagnose MRSA infection nor to guide or monitor treatment for MRSA infections. Performed at [facility], [place] [address] [place], [place] [address]   Surgical pcr screen     Status: None   Collection Time: 09/28/18 10:43 AM  Result Value Ref Range Status   MRSA, PCR NEGATIVE NEGATIVE Final   Staphylococcus aureus NEGATIVE NEGATIVE Final    Comment: (NOTE) The Xpert SA Assay (FDA  approved for NASAL specimens in patients 89 years of age and older), is one component of a comprehensive surveillance program. It is not intended to diagnose infection nor to guide or monitor treatment. Performed at [facility], [place] [address] [place], [place] [address]   Fungus Culture With Stain     Status: None (Preliminary result)   Collection Time: 09/30/18 10:17 AM  Result Value Ref Range Status   Fungus Stain Final report  Final    Comment: (NOTE) Performed At: [facility] [place], [place] [id number] Rush Farmer MD [id number]    Fungus (Mycology) Culture PENDING  Incomplete   Fungal Source TISSUE  Final    Comment: Performed at [facility], [place] [address], [place], [place] [address]  Aerobic/Anaerobic Culture (surgical/deep wound)     Status: None   Collection Time: 09/30/18 10:17 AM  Result Value Ref Range Status   Specimen Description TISSUE  Final   Special Requests NONE  Final   Gram Stain   Final    RARE WBC PRESENT, PREDOMINANTLY MONONUCLEAR RARE GRAM NEGATIVE RODS    Culture   Final    No growth aerobically or anaerobically. Performed at [facility], [place] [address], [place], [place] [address]    Report Status 10/05/2018 FINAL  Final  Acid Fast Smear (AFB)     Status: None   Collection Time: 09/30/18 10:17 AM  Result Value Ref Range Status   AFB Specimen Processing Comment  Final    Comment: Tissue Grinding and Digestion/Decontamination   Acid Fast Smear Negative  Final    Comment: (NOTE) Performed At: [facility] [place], [place] [id number] Rush Farmer MD [id number]    Source (AFB) TISSUE  Final    Comment: Performed at [facility], [place] [address] [place], [place] [address]  Fungus Culture Result     Status: None   Collection Time: 09/30/18 10:17 AM  Result Value Ref Range Status   Result 1 Comment  Final    Comment: (NOTE) KOH/Calcofluor preparation:  no fungus  observed. Performed At: [facility] [place], [place] [id number] Rush Farmer MD [id number]   Expectorated sputum assessment w rflx to resp cult     Status: None   Collection Time: 10/04/18  9:18 AM  Result Value Ref Range Status   Specimen Description EXPECTORATED SPUTUM  Final   Special Requests NONE  Final   Sputum evaluation   Final    THIS SPECIMEN IS ACCEPTABLE FOR SPUTUM CULTURE Performed at [facility], [address], [place], [place] [address]    Report Status 10/04/2018 FINAL  Final  Culture, respiratory     Status: None   Collection Time: 10/04/18  9:18 AM  Result Value Ref Range Status   Specimen Description EXPECTORATED SPUTUM  Final   Special Requests NONE Reflexed from [id number]  Final   Gram Stain   Final    FEW WBC PRESENT, PREDOMINANTLY PMN FEW GRAM POSITIVE COCCI Performed at [facility], [place] [address], [place], [place] [address]    Culture FEW SERRATIA MARCESCENS  Final   Report Status 10/06/2018 FINAL  Final   Organism ID, Bacteria SERRATIA MARCESCENS  Final      Susceptibility   Serratia marcescens - MIC*    CEFAZOLIN >=64 RESISTANT Resistant     CEFEPIME <=1 SENSITIVE Sensitive     CEFTAZIDIME <=1 SENSITIVE Sensitive     CEFTRIAXONE <=1 SENSITIVE Sensitive     CIPROFLOXACIN <=0.25 SENSITIVE Sensitive     GENTAMICIN <=1 SENSITIVE Sensitive     TRIMETH/SULFA <=20 SENSITIVE Sensitive     * FEW SERRATIA MARCESCENS     Assessment/Plan: S/P Procedure(s) (LRB): CORONARY ARTERY BYPASS GRAFTING (CABG) X THREE, LIMA TO LAD, SVG TO DIAGONAL, SVG TO PDA  USING LEFT INTERNAL MAMMERY ARTERY AND RIGHT AND LEFT GREATER SAPHENOUS VEIN (N/A) TRANSESOPHAGEAL ECHOCARDIOGRAM (TEE) (N/A) LEFT UPPER LOBE WEDGE RESECTION (Left) Mobilize Continue to push nutrition Diuresis She may require blood transfusion, will follow up on hematocrit after additional Lasix Continues on IV antibiotics PO  amnio currently-but with worsening  pulmonary infiltrates discussed with cardiology and will stop the amnio, On beta blocker  Sputum culture growing few Serratia marcescens-we will leave to pulmonary to adjust antibiotics as appropriate    [name] 10/08/2018 7:26 AM

## 2018-10-08 NOTE — Progress Notes (Signed)
Pt HR 130-150's. Appears to be afib. Cardiology MD paged to make aware. Advised to obtain EKG. Per EKG-Sinustach. Pt  HR 100-110's currently sustaining.  Will continue to monitor.

## 2018-10-08 NOTE — Plan of Care (Signed)
  Problem: Education: Goal: Knowledge of General Education information will improve Description Including pain rating scale, medication(s)/side effects and non-pharmacologic comfort measures Outcome: Progressing   Problem: Clinical Measurements: Goal: Ability to maintain clinical measurements within normal limits will improve Outcome: Progressing Goal: Will remain free from infection Outcome: Progressing Goal: Diagnostic test results will improve Outcome: Progressing Goal: Respiratory complications will improve Outcome: Progressing Goal: Cardiovascular complication will be avoided Outcome: Progressing   Problem: Activity: Goal: Risk for activity intolerance will decrease Outcome: Progressing   Problem: Nutrition: Goal: Adequate nutrition will be maintained Outcome: Progressing   Problem: Elimination: Goal: Will not experience complications related to bowel motility Outcome: Progressing   Problem: Pain Managment: Goal: General experience of comfort will improve Outcome: Progressing   Problem: Safety: Goal: Ability to remain free from injury will improve Outcome: Progressing   Problem: Skin Integrity: Goal: Risk for impaired skin integrity will decrease Outcome: Progressing   Problem: Activity: Goal: Ability to return to baseline activity level will improve Outcome: Progressing   Problem: Activity: Goal: Risk for activity intolerance will decrease Outcome: Progressing   Problem: Cardiac: Goal: Will achieve and/or maintain hemodynamic stability Outcome: Progressing   Problem: Respiratory: Goal: Respiratory status will improve Outcome: Progressing   Problem: Skin Integrity: Goal: Wound healing without signs and symptoms of infection Outcome: Progressing Goal: Risk for impaired skin integrity will decrease Outcome: Progressing

## 2018-10-08 NOTE — Progress Notes (Signed)
RN paged Dr. Cyndia Bent to express concerns for patients respiraory status. Patients O2 sat is frequently dropping down into the 70s and she is taking a long time to recover. She is maxed out on 15L/min HFNC. Patient has had breathing treatment and anxiety medicine since 2000. She still is anxious, and tachypnic. RN believes it is her hypoxia causing it. Hgb was 7.6 this morning, RN also questioned giving a unit of blood with some lasix to further try to diuresis her, but increase her oxygen carrying compacity.   No new orders given, Dr. Servando Snare will decide in AM if unit of blood is needed.  RN will continue to monitor closely.  Charlsie Quest

## 2018-10-08 NOTE — Progress Notes (Signed)
Patient ID: Sarah Escobar, female   DOB: [birth year], 62 y.o.   MRN: [id number] TCTS Evening Rounds:  Hemodynamically stable sats 91-93% on 15L HFNC.

## 2018-10-08 NOTE — Progress Notes (Signed)
Progress Note  Patient Name: Sarah Escobar Date of Encounter: 10/08/2018  Primary Cardiologist: Dorris Carnes, MD   Subjective   More dyspneic this AM; no CP  Inpatient Medications    Scheduled Meds: . amiodarone  400 mg Oral BID  . aspirin EC  325 mg Oral Daily   Or  . aspirin  324 mg Per Tube Daily  . atorvastatin  80 mg Oral q1800  . buPROPion  300 mg Oral q morning - 10a  . Chlorhexidine Gluconate Cloth  6 each Topical Daily  . cholecalciferol  2,000 Units Oral Daily  . enoxaparin (LOVENOX) injection  30 mg Subcutaneous QHS  . feeding supplement  1 Container Oral TID BM  . finasteride  2.5 mg Oral Daily  . fluticasone  2 spray Each Nare BID  . fluticasone furoate-vilanterol  1 puff Inhalation Daily  . furosemide  40 mg Intravenous Once  . guaiFENesin  1,200 mg Oral BID  . ipratropium  0.5 mg Nebulization TID  . levalbuterol  1.25 mg Nebulization TID  . loratadine  10 mg Oral Daily  . mouth rinse  15 mL Mouth Rinse BID  . metoprolol tartrate  25 mg Oral BID   Or  . metoprolol tartrate  12.5 mg Per Tube BID  . pantoprazole  40 mg Oral Daily  . sodium chloride  2 spray Each Nare BID  . sodium chloride flush  10-40 mL Intracatheter Q12H  . sodium chloride flush  10-40 mL Intracatheter Q12H  . sodium chloride flush  3 mL Intravenous Q12H   Continuous Infusions: . sodium chloride Stopped (10/07/18 0653)  . cefTRIAXone (ROCEPHIN)  IV 2 g (10/07/18 1417)   PRN Meds: HYDROcodone-acetaminophen, ipratropium, levalbuterol, LORazepam, metoprolol tartrate, ondansetron (ZOFRAN) IV, sodium chloride flush, sodium chloride flush, sodium chloride flush, traMADol   Vital Signs    Vitals:   10/08/18 0402 10/08/18 0546 10/08/18 0642 10/08/18 0700  BP:    119/62  Pulse: 86  93   Resp: 12  20 (!) 24  Temp:      TempSrc:      SpO2: 90%  90%   Weight:  66.4 kg    Height:        Intake/Output Summary (Last 24 hours) at 10/08/2018 0732 Last data filed at 10/08/2018 0400 Gross  per 24 hour  Intake 810.08 ml  Output 500 ml  Net 310.08 ml   Filed Weights   10/06/18 0500 10/07/18 0500 10/08/18 0546  Weight: 65 kg 66.1 kg 66.4 kg    Telemetry   Sinus - Personally Reviewed   Physical Exam   GEN: No acute distress.  Dyspneic Neck: Supple Cardiac: RRR Respiratory: Diffuse crackles and rhonchi GI: Soft, NT/ND MS: 1+ edemA Neuro: grossly intact   Labs    Chemistry Recent Labs  Lab 10/06/18 0509 10/06/18 1522 10/07/18 0555 10/08/18 0423  NA 132* 134* 133* 129*  K 3.6  --  3.9 4.0  CL 99  --  98 96*  CO2 28  --  28 27  GLUCOSE 119*  --  115* 108*  BUN 8  --  9 10  CREATININE 0.70  --  0.75 0.85  CALCIUM 8.5*  --  8.4* 8.5*  GFRNONAA >60  --  >60 >60  GFRAA >60  --  >60 >60  ANIONGAP 5  --  7 6     Hematology Recent Labs  Lab 10/05/18 0454 10/07/18 0555 10/08/18 0423  WBC 11.3* 11.9* 13.4*  RBC 2.63* 2.46* 2.42*  HGB 8.5* 7.7* 7.6*  HCT 25.5* 24.5* 23.7*  MCV 97.0 99.6 97.9  MCH 32.3 31.3 31.4  MCHC 33.3 31.4 32.1  RDW 12.5 12.6 12.5  PLT 196 299 349    Radiology    Dg Chest Port 1 View  Result Date: 10/08/2018 CLINICAL DATA:  Shortness of breath. EXAM: PORTABLE CHEST 1 VIEW COMPARISON:  Radiograph October 07, 2018. FINDINGS: Stable cardiomediastinal silhouette. Status post coronary artery bypass graft. No pneumothorax is noted. Stable bilateral lung opacities are noted concerning for edema or pneumonia. Minimal bilateral pleural effusions may be present. Right-sided PICC line is unchanged in position. Bony thorax is unremarkable. IMPRESSION: Stable bilateral lung opacities are noted concerning for edema or possibly pneumonia. Electronically Signed   By: Marijo Conception, M.D.   On: 10/08/2018 07:16   Dg Chest Port 1 View  Result Date: 10/07/2018 CLINICAL DATA:  History of CABG.  Shortness of breath. EXAM: PORTABLE CHEST 1 VIEW COMPARISON:  10/06/2018 FINDINGS: Right arm PICC line tip in the lower SVC and stable. Stable appearance  of the median sternotomy wires. Surgical plate in the lower cervical spine is partially visualized. Interstitial and hazy lung densities are again noted with slightly increased densities at both lung bases. Blunting at the costophrenic angles are suggestive for small effusions. Cardiomediastinal silhouette is stable. IMPRESSION: 1. Increased densities at both lung bases could represent atelectasis versus increased airspace disease. 2. Bilateral hazy lung densities suggesting acute on chronic disease and concerning for pulmonary edema. 3. Probable small pleural effusions. Electronically Signed   By: Markus Daft M.D.   On: 10/07/2018 07:57    Patient Profile     62 y.o. female with past medical history of interstitial lung disease admitted with non-ST elevation myocardial infarction.  Cardiac catheterization revealed ostial LAD and mid RCA lesion.  Normal LV function.  Echocardiogram showed normal LV function.  Patient is now status post coronary artery bypass and graft and left upper lobe wedge resection.  Assessment & Plan    1 coronary artery disease status post coronary artery bypass and graft/non-ST elevation myocardial infarction-Continue present meds and follow.  2 postoperative atrial fibrillation-patient remains in sinus rhythm this morning.  However she is more dyspneic and chest x-ray looks worse.  Not clear that this is related to amiodarone but certainly could be contributing.  I have discussed the patient with Dr. Curt Bears.  Options for antiarrhythmics now are limited as she has had a full load of amiodarone and therefore would not be an immediate candidate for sotalol or Tikosyn.  Given worsening dyspnea and chest x-ray I will discontinue amiodarone. If she develops recurrent atrial fibrillation we will try rate control as much as allowed by blood pressure. Continue metoprolol. CHADSvasc 3.  She would benefit from anticoagulation if she can tolerate from a pulmonary standpoint (however patient  did have hemoptysis based on previous notes).  We will continue aspirin for now.    3 interstitial lung disease-possible contribution from amiodarone; will DC; pulmonary follow.   4 postoperative volume excess-remains volume overloaded; continue diuresis.  For questions or updates, please contact [place] Please consult [email or web address] for contact info under        Signed, Kirk Ruths, MD  10/08/2018, 7:32 AM

## 2018-10-08 NOTE — Progress Notes (Signed)
[facility] Progress Note Patient Name: Sarah Escobar DOB: March 02, 1956 MRN: [id number]   Date of Service  10/08/2018  HPI/Events of Note  Patient dropped sat to 76% on 15 L/min [place] O2. Now on NRBM. RR = 30. Patient is already on Ceftriaxone.  BP = 133/63. LVEF = 40-45%.  eICU Interventions  Will order: 1. Lasix 40 mg IV X 1 now.  2. Trial of BiPAP. May only need for short term support. Keep sat = 90-92%. 3. Albuterol Rx PRN as already ordered.  4. Portable CXR STAT     Intervention Category Major Interventions: Hypoxemia - evaluation and management  Sarah Escobar 10/08/2018, 11:18 PM

## 2018-10-08 NOTE — Progress Notes (Signed)
NAME:  Sarah Escobar, MRN:  [id number], DOB:  1961/04/07, LOS: 12 ADMISSION DATE:  09/26/2018, CONSULTATION DATE:  09/28/18 REFERRING MD:  Dr Nils Pyle, CHIEF COMPLAINT:  Pre CABG pulm  Evaluation f   HPI   Sarah Escobar -is being seen for preoperative pulmonary evaluation prior to bypass surgery.  She is known to have interstitial lung disease followed by Dr. Elsworth Soho.  She has a known UIP pattern.  She now presented with chest pain and has been set up for a bypass on Monday, September 22, 2018.  Dr. Darcey Nora has requested preoperative pulmonary clearance.  In terms of interstitial lung disease: She reports that she has been following in the pulmonary clinic for a few years.  In 2015 she had hypersensitivity pneumonitis panel and it was normal.  She had ANA and rheumatoid factor antibodies in 2015 and these were normal.  She reports that up until 6 months ago she was exercising on her stationary bike for 5 miles without any problems.  But then she stopped exercising because she was developing tiredness.he had a high-resolution CT scan of the chest March 2019 that I personally visualized.  The official report is that she has air-trapping slight upper lobe predominant reticulation.  Significant progression compared to March 2016,  3 years earlier.  There is some honeycombing in the left upper lobe.  Features were thought to be consistent with hypersensitivity pneumonitis chronic.  Then on March 04, 2018 she underwent bronchoscopy with lavage that showed polymorphs of 94% and a transbronchial biopsy that showed nonspecific inflammation but tissue was felt to be insufficient..    She has been on observation therapy.  She is not on chronic prednisone.  She has not been on any oxygen.  Never had any chest pain until this admission.Marland Kitchen  However she stopped exercising because she was having this tiredness 6 months ago and. did not notice any shortness of breath with activities of daily living except 3 weeks ago when  she walked 1 mile in [place] and she had to stop 3 times which is more than her husband.  She had never had this problem a year earlier.  Pulmonary function test done yesterday September 27, 2018 shows a 17% decline in [facility] compared to 4-1/2 years earlier which is compatible with a CT chest showing progression.  She is aware of a progressive interstitial lung disease.  Interstitial lung disease related questions: This was elicited without a formal questionnaire.  She denies any obvious mold or mildew exposure in the house.  She does not use any water fountains, CPAP, humidifier.  Denies any mildew exposure in the house.  Denies working in a [employer].  She works out of home.  Does not do any gardening.  No exposure to rotten wood or mulch.  No usage of feather pillows or blankets.  No pet hamsters or gerbils in the house.  No birds in the house.  She has never been exposed to amiodarone or chemotherapy or radiation therapy.  5 or 6 years ago she did take nitrofurantoin for a few weeks only.  Her grandmother died of some lung disease not otherwise specified.  She is a previous smoker 30 pack.  Quit 16 years ago.  In terms of connective tissue disease and collagen vascular disease: She denies this.  But on closer questioning she says that in the winter season her fingers might turn blue.  In terms of etiologic work-up May 2015 ANA and rheumatoid factor negative.  February 2017 ANA negative.  Anti-Jo 1 in May 2019- negative.  Blood allergy panel in August 2016- including IgE.  Other issue: She has chronic sinusitis.  2 weeks ago she did get antibiotics for an acute flareup.  She also took a few days of prednisone.   Hospital course: Since admission has not had any chest pain.  She is worried about pulmonary complications following bypass on account of interstitial lung disease.  Results for Sarah Escobar (MRN [id number]) as of 09/28/2018 12:18  Ref. Range 04/07/2014 12:57 09/27/2018 10:23  FVC-Pre  Latest Units: L 2.54 2.10 (17% decline in 4.5 years)  FVC-%Pred-Pre Latest Units: % 75 60  FEV1-Pre Latest Units: L 2.27 1.92  FEV1-%Pred-Pre Latest Units: % 86 71  Pre FEV1/FVC ratio Latest Units: % 89 91  Results for Escobar, CAULFIELD (MRN [id number]) as of 09/28/2018 12:18  Ref. Range 04/07/2014 12:57  DLCO unc Latest Units: ml/min/mmHg 18.56  DLCO unc % pred Latest Units: % 76   Past Medical History     has a past medical history of Allergy, Asthma, Depression, Hyperlipidemia, Hypertension, Osteopenia, Personal history of adenomatous colonic polyps/FHx colon cancer sister and father, and Sciatica of left side.   reports that she quit smoking about 16 years ago. Her smoking use included cigarettes. She has a 30.00 pack-year smoking history. She has never used smokeless tobacco.  Past Surgical History:  Procedure Laterality Date  . BUNIONECTOMY    . CERVICAL FUSION  1999   Dr Arnoldo Morale, [place]  . COLONOSCOPY  2013  . COLONOSCOPY W/ POLYPECTOMY  multiple   adenomas; [name]; last 2013  . CORONARY ARTERY BYPASS GRAFT N/A 09/30/2018   Procedure: CORONARY ARTERY BYPASS GRAFTING (CABG) X THREE, LIMA TO LAD, SVG TO DIAGONAL, SVG TO PDA  USING LEFT INTERNAL MAMMERY ARTERY AND RIGHT AND LEFT GREATER SAPHENOUS VEIN;  Surgeon: [name], MD;  Location: [facility];  Service: Open Heart Surgery;  Laterality: N/A;  . discetomy lumbar  02/19/13   L1&2; Dr Arnoldo Morale, [place]  . LEFT HEART CATH AND CORONARY ANGIOGRAPHY N/A 09/26/2018   Procedure: LEFT HEART CATH AND CORONARY ANGIOGRAPHY;  Surgeon: Nelva Bush, MD;  Location: [facility];  Service: Cardiovascular;  Laterality: N/A;  . NASAL SINUS SURGERY  [phone]   X 3  . POLYPECTOMY  1217--2013   TA+  . TEE WITHOUT CARDIOVERSION N/A 09/30/2018   Procedure: TRANSESOPHAGEAL ECHOCARDIOGRAM (TEE);  Surgeon: [name], MD;  Location: [facility];  Service: Open Heart Surgery;  Laterality: N/A;  . TOTAL ABDOMINAL HYSTERECTOMY W/ BILATERAL  SALPINGOOPHORECTOMY  2003   endometriosis   . VIDEO BRONCHOSCOPY Bilateral 03/04/2018   Procedure: VIDEO BRONCHOSCOPY WITH FLUORO;  Surgeon: Rigoberto Noel, MD;  Location: [facility];  Service: Cardiopulmonary;  Laterality: Bilateral;  . WEDGE RESECTION Left 09/30/2018   Procedure: LEFT UPPER LOBE WEDGE RESECTION;  Surgeon: [name], MD;  Location: [facility];  Service: Open Heart Surgery;  Laterality: Left;    Allergies  Allergen Reactions  . Flagyl [Metronidazole] Other (See Comments)    Nerve pain  . Levofloxacin Rash  . Loperamide Hcl Rash  . Neomycin-Bacitracin Zn-Polymyx Rash  . Ofloxacin Rash  . Sulfonamide Derivatives Hives  . Band-Aid Liquid Bandage [New Skin] Other (See Comments)    Regular band-aid cause itching and redness  . Collodion Dermatitis  . Gabapentin Other (See Comments)    Mental status changes  . Latex Other (See Comments) and Dermatitis    "redness"  .  Sulfa Antibiotics Hives  . Oxycontin [Oxycodone Hcl] Rash         Immunization History  Administered Date(s) Administered  . Influenza Whole 09/09/2008, 09/10/2009, 09/03/2012  . Influenza,inj,Quad PF,6+ Mos 08/25/2014, 09/03/2017, 09/17/2018  . Influenza-Unspecified 09/03/2013, 09/09/2015, 09/06/2016  . Pneumococcal Conjugate-13 08/25/2014  . Pneumococcal Polysaccharide-23 01/14/2007, 10/31/2017  . Tdap 10/29/2013    Family History  Problem Relation Age of Onset  . Colon cancer Father   . Heart attack Father 40  . Diabetes Father   . Stroke Father 24  . Colon cancer Sister   . Asthma Sister   . Asthma Mother   . Colitis Mother   . Cirrhosis Mother        non alcoholic, fatty liver  . Asthma Maternal Aunt   . Heart attack Sister 84  . Cirrhosis Brother        non alcoholic  . Colon polyps Brother   . Anemia Brother         Spherocytosis, hereditrary  . Asthma Maternal Grandmother   . Kidney failure Unknown        2 bro, 1 sister ? from HTN  . Prostate cancer Brother   . Breast  cancer Neg Hx      Current Facility-Administered Medications:  .  0.9 %  sodium chloride infusion, 250 mL, Intravenous, Continuous, Barrett, Erin R, PA-C, Stopped at 10/07/18 0653 .  aspirin EC tablet 325 mg, 325 mg, Oral, Daily, 325 mg at 10/08/18 0823 **OR** aspirin chewable tablet 324 mg, 324 mg, Per Tube, Daily, Barrett, Erin R, PA-C .  atorvastatin (LIPITOR) tablet 80 mg, 80 mg, Oral, q1800, Barrett, Erin R, PA-C, 80 mg at 10/07/18 1644 .  buPROPion (WELLBUTRIN XL) 24 hr tablet 300 mg, 300 mg, Oral, q morning - 10a, Barrett, Erin R, PA-C, 300 mg at 10/08/18 1655 .  cefTRIAXone (ROCEPHIN) 2 g in sodium chloride 0.9 % 100 mL IVPB, 2 g, Intravenous, Q24H, Kara Mead V, MD, Last Rate: 200 mL/hr at 10/07/18 1417, 2 g at 10/07/18 1417 .  Chlorhexidine Gluconate Cloth 2 % PADS 6 each, 6 each, Topical, Daily, [name], MD, 6 each at 10/07/18 1041 .  cholecalciferol (VITAMIN D) tablet 2,000 Units, 2,000 Units, Oral, Daily, Barrett, Erin R, PA-C, 2,000 Units at 10/08/18 3748 .  enoxaparin (LOVENOX) injection 30 mg, 30 mg, Subcutaneous, QHS, [name], MD, 30 mg at 10/07/18 2107 .  feeding supplement (BOOST / RESOURCE BREEZE) liquid 1 Container, 1 Container, Oral, TID BM, [name], MD, 1 Container at 10/07/18 1302 .  finasteride (PROSCAR) tablet 2.5 mg, 2.5 mg, Oral, Daily, Barrett, Erin R, PA-C, 2.5 mg at 10/08/18 2707 .  fluticasone (FLONASE) 50 MCG/ACT nasal spray 2 spray, 2 spray, Each Nare, BID, Barrett, Erin R, PA-C, 2 spray at 10/08/18 0827 .  fluticasone furoate-vilanterol (BREO ELLIPTA) 100-25 MCG/INH 1 puff, 1 puff, Inhalation, Daily, Barrett, Erin R, PA-C, 1 puff at 10/08/18 0814 .  guaiFENesin (MUCINEX) 12 hr tablet 1,200 mg, 1,200 mg, Oral, BID, Gaye Pollack, MD, 1,200 mg at 10/08/18 0823 .  HYDROcodone-acetaminophen (NORCO/VICODIN) 5-325 MG per tablet 1-2 tablet, 1-2 tablet, Oral, Q4H PRN, Gaye Pollack, MD, 1 tablet at 10/08/18 [phone] .  ipratropium  (ATROVENT) nebulizer solution 0.5 mg, 0.5 mg, Nebulization, TID, [name], MD, 0.5 mg at 10/08/18 0746 .  ipratropium (ATROVENT) nebulizer solution 0.5 mg, 0.5 mg, Nebulization, Q6H PRN, [name], MD, 0.5 mg at 10/06/18 0241 .  levalbuterol (  XOPENEX) nebulizer solution 1.25 mg, 1.25 mg, Nebulization, TID, [name], MD, 1.25 mg at 10/08/18 0746 .  levalbuterol (XOPENEX) nebulizer solution 1.25 mg, 1.25 mg, Nebulization, Q6H PRN, [name], MD, 1.25 mg at 10/08/18 0054 .  loratadine (CLARITIN) tablet 10 mg, 10 mg, Oral, Daily, Barrett, Erin R, PA-C, 10 mg at 10/08/18 0539 .  LORazepam (ATIVAN) tablet 0.5 mg, 0.5 mg, Oral, Q6H PRN, Barrett, Erin R, PA-C, 0.5 mg at 10/07/18 2305 .  MEDLINE mouth rinse, 15 mL, Mouth Rinse, BID, [name], MD, 15 mL at 10/06/18 0919 .  metoprolol tartrate (LOPRESSOR) injection 2.5-5 mg, 2.5-5 mg, Intravenous, Q2H PRN, Barrett, Erin R, PA-C, 5 mg at 10/06/18 0742 .  metoprolol tartrate (LOPRESSOR) tablet 25 mg, 25 mg, Oral, BID, 25 mg at 10/08/18 7673 **OR** [DISCONTINUED] metoprolol tartrate (LOPRESSOR) 25 mg/10 mL oral suspension 12.5 mg, 12.5 mg, Per Tube, BID, Crenshaw, Denice Bors, MD .  ondansetron (ZOFRAN) injection 4 mg, 4 mg, Intravenous, Q6H PRN, Barrett, Erin R, PA-C .  pantoprazole (PROTONIX) EC tablet 40 mg, 40 mg, Oral, Daily, Barrett, Erin R, PA-C, 40 mg at 10/08/18 0821 .  sodium chloride (OCEAN) 0.65 % nasal spray 2 spray, 2 spray, Each Nare, BID, Barrett, Erin R, PA-C, 2 spray at 10/08/18 0827 .  sodium chloride flush (NS) 0.9 % injection 10-40 mL, 10-40 mL, Intracatheter, Q12H, [name], MD, 10 mL at 10/07/18 2110 .  sodium chloride flush (NS) 0.9 % injection 10-40 mL, 10-40 mL, Intracatheter, PRN, Lanelle Bal B, MD .  sodium chloride flush (NS) 0.9 % injection 10-40 mL, 10-40 mL, Intracatheter, Q12H, [name], MD, 10 mL at 10/08/18 0824 .  sodium chloride flush (NS) 0.9 % injection 10-40  mL, 10-40 mL, Intracatheter, PRN, [name], MD .  sodium chloride flush (NS) 0.9 % injection 3 mL, 3 mL, Intravenous, Q12H, Barrett, Erin R, PA-C, 3 mL at 10/06/18 2108 .  sodium chloride flush (NS) 0.9 % injection 3 mL, 3 mL, Intravenous, PRN, Barrett, Erin R, PA-C .  traMADol (ULTRAM) tablet 50-100 mg, 50-100 mg, Oral, Q4H PRN, Barrett, Erin R, PA-C, 50 mg at 10/07/18 [facility] Events   09/26/2018  admit 09/28/18 - CCM consult pre CABG  Consults: date of consult/date signed off & final recs:  09/28/18 - ccm consult  Procedures (surgical and bedside):  10/28:CABG x 3 10/28: Wedge resection of LUL with biopsy and cuture  Significant Diagnostic Tests:  HRCT Chest that I reviewed myself, fibrotic changes noted Spectrum of findings compatible with fibrotic interstitial lung disease with moderate air trapping. Chronic hypersensitivity pneumonitis is favored. Minimal progression of bronchiectasis since 02/14/2018 high-resolution chest CT study. Findings are otherwise stable. Findings are suggestive of an alternative diagnosis   Micro Data:  10/28: Sputum for AFB>> 10/28: Sputum for Culture ( Anaerobic and Aerobic)>> 10/28: Sputum for Culture?? GS + for few gram + cocci>>  Surgical pathology>>Dr. Elsworth Soho discussed with pathology, single biopsy from upper lobe seems to be more consistent with fibrotic NSIP, will send out for second opinion. No evidence of collagen vascular disease   Antimicrobials:  Zinacef: 09/30/2018>>10/31 Rocephin  10/31>> 09/30/2018: Vanc x 1 dose  SUBJECTIVE/OVERNIGHT/INTERVAL HX  No events overnight No new complaints  Objective   Blood pressure (!) 103/57, pulse 88, temperature 97.7 F (36.5 C), temperature source Oral, resp. rate 19, height _0  (1.676 m), weight 66.4 kg, SpO2 90 %.        Intake/Output Summary (Last  24 hours) at 10/08/2018 1012 Last data filed at 10/08/2018 0900 Gross per 24 hour  Intake 420 ml  Output 650  ml  Net -230 ml   Filed Weights   10/06/18 0500 10/07/18 0500 10/08/18 0546  Weight: 65 kg 66.1 kg 66.4 kg    General: Frail elderly female, OOB in chair on 15 L HFO2 HEENT: [place]/AT, PERRL, EOM-I and MMM Neuro: Alert and interactive, moving all ext to command, appropriate CV: IRIR, Nl S1/S2 and -M/R/G PULM: Bilateral chest excursion, Decreased BS in the bases, few rhonchi and crackles GI: Soft, NT, ND and +BS Extremities: 1+ edema LE, warm and dry, staples to legs bilaterally,  Skin: no rashes or lesions, no tattoos, sternotomy healing  Resolved Hospital Problem list   10/28>> Extubation  Assessment & Plan:   Preoperative pulmonary evaluation: Her FEV1 is less than 75% on account of the interstitial lung disease but she saturating 96% on room air and is quite functional and has intact nutritional status and renal function.  Therefore on the objective testing models above her risk for prolonged mechanical ventilation or reintubation following bypass surgery is less than 106% [in an 62 year old model set that did not include cardiac surgery].  In the more recent analysis the real risk is less than 3% [Gupta].  Similar risk profile for postoperative pneumonia as well.  However when he look at a composite of risk factors that include pneumonia and respiratory failure and atelectasis and effusion and oxygen dependence.  The risk for this is fairly high especially she got antibiotics and prednisone 2 weeks ago.  Nevertheless this is not prohibitive risk especially given the fact bypass is definitely indicated.  Intubation for CABG in previous smoker with ILD per HRCT Anxiety limiting weaning of oxygen Oxygen demand has increased ( Now on 15L HFO2) Increased risk of post operative pulmonary complications History of dyspnea prior to admission Extubation post op without difficulty CXR 11/5>> Stable bilateral lung opacities are noted concerning for edema or possibly pneumonia. Plan: Titrate O2  for sat of 92% Actively wean oxygen for above sats  Ativan as ordered for anxiety CXR 11/6 and prn Continue aggressive pulmonary hygiene IS Flutter valve PT/ OT OOB BD as ordered Pain control to encourage deep breaths and coughing Diureses 40 mg daily as tolerated  Post operative Atrial fibrillation with RVR  HR in the 130's Failed digoxin therapy Plan: Amio discontinued 11/5 ( Concern may be contributing to increase oxygen demand) D/C dig Continue Lopressor for BP and HR control No anticoagulation for now, but consider anticoagulation prior to discharge as she continues to have paroxysms of atrial fibrillation.    Interstitial lung disease - unknown etiology.  Progressive on imaging. ?  chronic hypersensitivity pneumonitis,  But extensive directed history she is denying any mold exposure or any organic antigen exposure.  Moreover the BAL neutrophilia in April 2019 does not fit in with hypersensitivity pneumonitis.  Previous autoimmune profile is negative.  Therefore surgical lung biopsy is indicated given the unclear reason for interstitial lung disease. ILD- Dr. Elsworth Soho discussed with pathology, single biopsy from upper lobe seems to be more consistent with fibrotic NSIP, will send out for second opinion. No evidence of collagen vascular disease Plan Biopsy to be sent out for second opinion Follow-up with outpatient with pulmonary O2 as needed AggressivePulmonary toilet  Leukocytosis Afebrile Respiratory Culture + for Serratia Marcescens ( Ceftriaxone sensitive)  Plan: Trend CXR Trend fever curve and WBC Continue Rocephin Re-culture as is clinically indicated  Hyponatremia Recent Labs  Lab 10/06/18 1522 10/07/18 0555 10/08/18 0423  NA 134* 133* 129*   Plan: Minimize free water  Chronic sinusitis -She feels this is chronic postnasal drip.  She does not feel this is an exacerbation. Plan: Continue nasal hygiene  Disposition / Summary of Today's Plan 10/08/18     Continued anxiety with weaning oxygen  Encouraged to allow RT to wean oxygen Explained she needs to minimize water and ice chip  intake, but can drink other beverages Amio d.c'd, rate controlled on metoprolol at present Continue daily Lasix 40 mg as BP tolerates Lung biopsy sent for second opinion   Magdalen Spatz, AGACNP-BC [place] Pager # [phone] Pager # [phone] after 3 pm  10/08/2018  10:46 AM  SIGNATURE

## 2018-10-09 ENCOUNTER — Inpatient Hospital Stay (HOSPITAL_COMMUNITY): Payer: 59

## 2018-10-09 LAB — CBC
HCT: 23.9 % — ABNORMAL LOW (ref 36.0–46.0)
Hemoglobin: 8 g/dL — ABNORMAL LOW (ref 12.0–15.0)
MCH: 32.3 pg (ref 26.0–34.0)
MCHC: 33.5 g/dL (ref 30.0–36.0)
MCV: 96.4 fL (ref 80.0–100.0)
Platelets: 408 10*3/uL — ABNORMAL HIGH (ref 150–400)
RBC: 2.48 MIL/uL — ABNORMAL LOW (ref 3.87–5.11)
RDW: 12.4 % (ref 11.5–15.5)
WBC: 17.4 10*3/uL — ABNORMAL HIGH (ref 4.0–10.5)
nRBC: 0 % (ref 0.0–0.2)

## 2018-10-09 LAB — COMPREHENSIVE METABOLIC PANEL
ALT: 207 U/L — ABNORMAL HIGH (ref 0–44)
AST: 225 U/L — ABNORMAL HIGH (ref 15–41)
Albumin: 2.4 g/dL — ABNORMAL LOW (ref 3.5–5.0)
Alkaline Phosphatase: 299 U/L — ABNORMAL HIGH (ref 38–126)
Anion gap: 6 (ref 5–15)
BUN: 12 mg/dL (ref 8–23)
CO2: 32 mmol/L (ref 22–32)
Calcium: 8.6 mg/dL — ABNORMAL LOW (ref 8.9–10.3)
Chloride: 94 mmol/L — ABNORMAL LOW (ref 98–111)
Creatinine, Ser: 0.86 mg/dL (ref 0.44–1.00)
GFR calc Af Amer: >60 mL/min (ref >60)
GFR calc non Af Amer: >60 mL/min (ref >60)
Glucose, Bld: 132 mg/dL — ABNORMAL HIGH (ref 70–99)
Potassium: 3.8 mmol/L (ref 3.5–5.1)
Sodium: 132 mmol/L — ABNORMAL LOW (ref 135–145)
Total Bilirubin: 0.6 mg/dL (ref 0.3–1.2)
Total Protein: 6 g/dL — ABNORMAL LOW (ref 6.5–8.1)

## 2018-10-09 LAB — GLUCOSE, CAPILLARY
Glucose-Capillary: 111 mg/dL — ABNORMAL HIGH (ref 70–99)
Glucose-Capillary: 124 mg/dL — ABNORMAL HIGH (ref 70–99)

## 2018-10-09 LAB — PHOSPHORUS: PHOSPHORUS: 4.6 mg/dL (ref 2.5–4.6)

## 2018-10-09 MED ORDER — DIGOXIN 0.25 MG/ML IJ SOLN
0.2500 mg | Freq: Once | INTRAMUSCULAR | Status: AC
  Administered 2018-10-09: 0.25 mg via INTRAVENOUS
  Filled 2018-10-09: qty 2

## 2018-10-09 MED ORDER — DIGOXIN 125 MCG PO TABS
0.1250 mg | ORAL_TABLET | Freq: Every day | ORAL | Status: DC
  Administered 2018-10-10 – 2018-10-23 (×14): 0.125 mg via ORAL
  Filled 2018-10-09 (×14): qty 1

## 2018-10-09 MED ORDER — DIGOXIN 0.25 MG/ML IJ SOLN: 0.2500 mg | Freq: Once | INTRAMUSCULAR | Status: DC

## 2018-10-09 MED ORDER — INSULIN ASPART 100 UNIT/ML ~~LOC~~ SOLN
0.0000 [IU] | Freq: Three times a day (TID) | SUBCUTANEOUS | Status: DC
  Administered 2018-10-09 – 2018-10-10 (×4): 1 [IU] via SUBCUTANEOUS
  Administered 2018-10-11: 2 [IU] via SUBCUTANEOUS
  Administered 2018-10-11 – 2018-10-12 (×2): 1 [IU] via SUBCUTANEOUS
  Administered 2018-10-12 – 2018-10-14 (×4): 2 [IU] via SUBCUTANEOUS
  Administered 2018-10-15: 3 [IU] via SUBCUTANEOUS
  Administered 2018-10-15: 1 [IU] via SUBCUTANEOUS
  Administered 2018-10-16: 2 [IU] via SUBCUTANEOUS
  Administered 2018-10-16: 1 [IU] via SUBCUTANEOUS

## 2018-10-09 MED ORDER — POTASSIUM CHLORIDE CRYS ER 20 MEQ PO TBCR
40.0000 meq | EXTENDED_RELEASE_TABLET | Freq: Every day | ORAL | Status: DC
  Administered 2018-10-09 – 2018-10-17 (×9): 40 meq via ORAL
  Filled 2018-10-09 (×9): qty 2

## 2018-10-09 MED ORDER — METHYLPREDNISOLONE SODIUM SUCC 125 MG IJ SOLR
125.0000 mg | Freq: Three times a day (TID) | INTRAMUSCULAR | Status: DC
  Administered 2018-10-09 – 2018-10-12 (×9): 125 mg via INTRAVENOUS
  Filled 2018-10-09 (×9): qty 2

## 2018-10-09 NOTE — Progress Notes (Signed)
[facility] BRIEF SICU PROGRESS NOTE  9 Days Post-Op  S/P Procedure(s) (LRB): CORONARY ARTERY BYPASS GRAFTING (CABG) X THREE, LIMA TO LAD, SVG TO DIAGONAL, SVG TO PDA  USING LEFT INTERNAL MAMMERY ARTERY AND RIGHT AND LEFT GREATER SAPHENOUS VEIN (N/A) TRANSESOPHAGEAL ECHOCARDIOGRAM (TEE) (N/A) LEFT UPPER LOBE WEDGE RESECTION (Left)   Stable day Breathing comfortably on HFNC Diuresing well  Plan: Continue current plan  Rexene Alberts, MD 10/09/2018 6:52 PM

## 2018-10-09 NOTE — Progress Notes (Signed)
Urinary retention discussed with CCM - bladder scan measured > 999 and patient has had an external purewick for almost 12 hours with no urine output despite IV lasix. RN discussed possibility of I&O catheter but MD wants an indwelling foley catheter for urinary retention and to accurately measure patient's I&Os while receiving lasix and in the ICU. Patient education provided.

## 2018-10-09 NOTE — Progress Notes (Addendum)
Progress Note  Patient Name: Sarah Escobar Date of Encounter: 10/09/2018  Primary Cardiologist: Dorris Carnes, MD   Subjective   Dyspneic on Bipap; no CP  Inpatient Medications    Scheduled Meds: . aspirin EC  325 mg Oral Daily   Or  . aspirin  324 mg Per Tube Daily  . atorvastatin  80 mg Oral q1800  . buPROPion  300 mg Oral q morning - 10a  . Chlorhexidine Gluconate Cloth  6 each Topical Daily  . cholecalciferol  2,000 Units Oral Daily  . enoxaparin (LOVENOX) injection  30 mg Subcutaneous QHS  . feeding supplement  1 Container Oral TID BM  . finasteride  2.5 mg Oral Daily  . fluticasone  2 spray Each Nare BID  . fluticasone furoate-vilanterol  1 puff Inhalation Daily  . furosemide  40 mg Intravenous Q12H  . guaiFENesin  1,200 mg Oral BID  . ipratropium  0.5 mg Nebulization TID  . levalbuterol  1.25 mg Nebulization TID  . loratadine  10 mg Oral Daily  . mouth rinse  15 mL Mouth Rinse BID  . methylPREDNISolone (SOLU-MEDROL) injection  40 mg Intravenous Q12H  . metoprolol tartrate  25 mg Oral BID  . pantoprazole  40 mg Oral Daily  . sodium chloride  2 spray Each Nare BID  . sodium chloride flush  10-40 mL Intracatheter Q12H  . sodium chloride flush  10-40 mL Intracatheter Q12H  . sodium chloride flush  3 mL Intravenous Q12H   Continuous Infusions: . sodium chloride Stopped (10/07/18 0653)  . cefTRIAXone (ROCEPHIN)  IV Stopped (10/08/18 1901)   PRN Meds: HYDROcodone-acetaminophen, ipratropium, levalbuterol, LORazepam, metoprolol tartrate, ondansetron (ZOFRAN) IV, sodium chloride flush, sodium chloride flush, sodium chloride flush, traMADol   Vital Signs    Vitals:   10/09/18 0346 10/09/18 0400 10/09/18 0500 10/09/18 0600  BP:   118/77 (!) 143/77  Pulse: 90 97 94 98  Resp: (!) 26 (!) 35 (!) 30 (!) 28  Temp:  97.8 F (36.6 C)    TempSrc:  Axillary    SpO2: 96% (!) 88% 92% 96%  Weight:   67.9 kg   Height:        Intake/Output Summary (Last 24 hours) at  10/09/2018 0711 Last data filed at 10/09/2018 0030 Gross per 24 hour  Intake 480 ml  Output 850 ml  Net -370 ml   Filed Weights   10/07/18 0500 10/08/18 0546 10/09/18 0500  Weight: 66.1 kg 66.4 kg 67.9 kg    Telemetry   Sinus with PAF and PVCs - Personally Reviewed   Physical Exam   GEN: Dyspneic; anxious Neck: JVP difficult to assess Cardiac: RRR, no murmur Respiratory: Rhonchi GI: Soft, NT/ND, no masses MS: trace edema Neuro: No focal findings   Labs    Chemistry Recent Labs  Lab 10/07/18 0555 10/08/18 0423 10/09/18 0501  NA 133* 129* 132*  K 3.9 4.0 3.8  CL 98 96* 94*  CO2 28 27 32  GLUCOSE 115* 108* 132*  BUN 9 10 12   CREATININE 0.75 0.85 0.86  CALCIUM 8.4* 8.5* 8.6*  PROT  --   --  6.0*  ALBUMIN  --   --  2.4*  AST  --   --  225*  ALT  --   --  207*  ALKPHOS  --   --  299*  BILITOT  --   --  0.6  GFRNONAA >60 >60 >60  GFRAA >60 >60 >60  ANIONGAP 7 6  6     Hematology Recent Labs  Lab 10/07/18 0555 10/08/18 0423 10/09/18 0501  WBC 11.9* 13.4* 17.4*  RBC 2.46* 2.42* 2.48*  HGB 7.7* 7.6* 8.0*  HCT 24.5* 23.7* 23.9*  MCV 99.6 97.9 96.4  MCH 31.3 31.4 32.3  MCHC 31.4 32.1 33.5  RDW 12.6 12.5 12.4  PLT 299 349 408*    Radiology    Dg Chest Port 1 View  Result Date: 10/08/2018 CLINICAL DATA:  Hypoxia. EXAM: PORTABLE CHEST 1 VIEW COMPARISON:  10/08/2018 at 0618 hours FINDINGS: Sequelae of prior CABG are again identified. A right PICC terminates over the lower SVC. The cardiac silhouette is borderline enlarged. Coarse interstitial opacities and scattered patchy airspace opacities in both lungs have not significantly changed. No sizable pleural effusion or pneumothorax is identified. IMPRESSION: Unchanged diffuse bilateral lung opacities which may reflect pneumonia or edema superimposed on chronic interstitial lung disease. Electronically Signed   By: Logan Bores M.D.   On: 10/08/2018 23:28   Dg Chest Port 1 View  Result Date:  10/08/2018 CLINICAL DATA:  Shortness of breath. EXAM: PORTABLE CHEST 1 VIEW COMPARISON:  Radiograph October 07, 2018. FINDINGS: Stable cardiomediastinal silhouette. Status post coronary artery bypass graft. No pneumothorax is noted. Stable bilateral lung opacities are noted concerning for edema or pneumonia. Minimal bilateral pleural effusions may be present. Right-sided PICC line is unchanged in position. Bony thorax is unremarkable. IMPRESSION: Stable bilateral lung opacities are noted concerning for edema or possibly pneumonia. Electronically Signed   By: Marijo Conception, M.D.   On: 10/08/2018 07:16    Patient Profile     62 y.o. female with past medical history of interstitial lung disease admitted with non-ST elevation myocardial infarction.  Cardiac catheterization revealed ostial LAD and mid RCA lesion.  Normal LV function.  Echocardiogram showed normal LV function.  Patient is now status post coronary artery bypass and graft and left upper lobe wedge resection.  Assessment & Plan    1 coronary artery disease status post coronary artery bypass and graft/non-ST elevation myocardial infarction-Continue ASA.   2 postoperative atrial fibrillation-amiodarone DCed due to potential lung toxicity; she had transient atrial fibrillation yesterday but is in sinus this AM. Given recent amiodarone load, cannot use sotalol or tikosyn. Therefore plan will be rate control. Continue lopressor. Add digoxin. She has not been anticoagulated due to lung disease and risk of recurrent hemoptysis. Continue ASA.  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 Continue diuresis for possible component of pulmonary edema.   4 postoperative volume excess-continue diuresis as tolerated.  5 Elevated LFTs-no RUQ tenderness. Possibly related to recent amiodarone. Hold lipitor for now. Follow LFTs.  For questions or updates, please contact [place] Please  consult [email or web address] for contact info under        Signed, Kirk Ruths, MD  10/09/2018, 7:11 AM

## 2018-10-09 NOTE — Progress Notes (Signed)
RN called [place] physician about patient's respiratory status. Patient continued to drop into the 70s on her HFNC and would not recover despite all efforts. Patient was changed to a NRBM. Patient recovered slowly. O2 sat only got as high as 84-85%, and patient was still visibly struggling to breathe and was very anxious and diaphoretic.  Dr. Oletta Darter wanted to try patient on BiPap and a second dose of lasix 40mg  to diuresis her. A portable CXR 1 view was also ordered to assess lung fluid status, compared to previous CXR from this AM. RN to call Dr. Oletta Darter back 30 mins after lasix dose if patient has not started to look better.   RN will continue to monitor.   Charlsie Quest

## 2018-10-09 NOTE — Progress Notes (Signed)
Called by RN that patient had returned to AF w/ RVR.   Recent hospital events reviewed, including worsening respiratory status requiring diuresis and support with bipap resulting in discontinuation of amiodarone. Remains on metoprolol 25mg  q6 hours.   At the time of my arrival to the bedside, patient returned to sinus rhythm in the 90s.   Can consider increasing metoprolol dose as BP allows (BP on my assessment 120/80) should patient return to AF.   Amyrah Pinkhasov K. Marletta Lor, MD

## 2018-10-09 NOTE — Progress Notes (Signed)
Paged Cards Fellow on-call, patient is back in Afib rates 110s to 130s at rest. BP stable. Rate comes down slightly but still rapid after patient calms down. Rate gets even higher than 130s when she is awake and anxious.  MD in route to bedside to assess patient.     Sarah Escobar

## 2018-10-09 NOTE — Plan of Care (Signed)
  Problem: Education: Goal: Knowledge of General Education information will improve Description Including pain rating scale, medication(s)/side effects and non-pharmacologic comfort measures Outcome: Progressing   Problem: Clinical Measurements: Goal: Diagnostic test results will improve Outcome: Progressing Goal: Respiratory complications will improve Outcome: Not Progressing   Problem: Activity: Goal: Risk for activity intolerance will decrease Outcome: Progressing   Problem: Nutrition: Goal: Adequate nutrition will be maintained Outcome: Progressing   Problem: Elimination: Goal: Will not experience complications related to bowel motility Outcome: Progressing Goal: Will not experience complications related to urinary retention Outcome: Progressing   Problem: Pain Managment: Goal: General experience of comfort will improve Outcome: Progressing   Problem: Safety: Goal: Ability to remain free from injury will improve Outcome: Progressing

## 2018-10-09 NOTE — Progress Notes (Signed)
BIPAP started per MD order. Patient Spo2 < 88% on NRB. Previously on 15L HFNC.

## 2018-10-09 NOTE — Progress Notes (Signed)
NAME:  Sarah Escobar, MRN:  [id number], DOB:  [birth year], LOS: 29 ADMISSION DATE:  09/26/2018, CONSULTATION DATE:  09/28/18 REFERRING MD:  Dr Nils Pyle, CHIEF COMPLAINT:  Pre CABG pulm  Evaluation f    Brief Summary  27 yoF followed by Dr. Elsworth Soho in office for ILD with UIP pattern.  Admitted with chest pain and NSTEMI  and underwent emergency CABG and lung biopsy on 10/28.  Postoperatively, developed atrial fibrillation requiring brief amiodarone and digoxin.  Additionally developed productive cough with increasing O2 requirements.  Ongoing diuresis.  Sputum cx positive for S. marcescens being treated with ceftriaxone.  Lung pathology consistent with fibrotic NSIP.   Current Facility-Administered Medications:  .  0.9 %  sodium chloride infusion, 250 mL, Intravenous, Continuous, Barrett, Erin R, PA-C, Stopped at 10/07/18 0653 .  aspirin EC tablet 325 mg, 325 mg, Oral, Daily, 325 mg at 10/08/18 0823 **OR** aspirin chewable tablet 324 mg, 324 mg, Per Tube, Daily, Barrett, Erin R, PA-C .  buPROPion (WELLBUTRIN XL) 24 hr tablet 300 mg, 300 mg, Oral, q morning - 10a, Barrett, Erin R, PA-C, 300 mg at 10/08/18 6010 .  cefTRIAXone (ROCEPHIN) 2 g in sodium chloride 0.9 % 100 mL IVPB, 2 g, Intravenous, Q24H, Rigoberto Noel, MD, Stopped at 10/08/18 1901 .  Chlorhexidine Gluconate Cloth 2 % PADS 6 each, 6 each, Topical, Daily, [name], MD, 6 each at 10/08/18 1030 .  cholecalciferol (VITAMIN D) tablet 2,000 Units, 2,000 Units, Oral, Daily, Barrett, Erin R, PA-C, 2,000 Units at 10/08/18 9323 .  digoxin (LANOXIN) 0.25 MG/ML injection 0.25 mg, 0.25 mg, Intravenous, Once, Crenshaw, Denice Bors, MD .  digoxin (LANOXIN) 0.25 MG/ML injection 0.25 mg, 0.25 mg, Intravenous, Once, Lelon Perla, MD .  Derrill Memo ON 10/10/2018] digoxin (LANOXIN) tablet 0.125 mg, 0.125 mg, Oral, Daily, Crenshaw, Denice Bors, MD .  enoxaparin (LOVENOX) injection 30 mg, 30 mg, Subcutaneous, QHS, [name], MD, 30 mg at 10/08/18  2133 .  feeding supplement (BOOST / RESOURCE BREEZE) liquid 1 Container, 1 Container, Oral, TID BM, [name], MD, 1 Container at 10/08/18 1945 .  finasteride (PROSCAR) tablet 2.5 mg, 2.5 mg, Oral, Daily, Barrett, Erin R, PA-C, 2.5 mg at 10/08/18 5573 .  fluticasone (FLONASE) 50 MCG/ACT nasal spray 2 spray, 2 spray, Each Nare, BID, Barrett, Erin R, PA-C, 2 spray at 10/08/18 0827 .  fluticasone furoate-vilanterol (BREO ELLIPTA) 100-25 MCG/INH 1 puff, 1 puff, Inhalation, Daily, Barrett, Erin R, PA-C, 1 puff at 10/08/18 0814 .  furosemide (LASIX) injection 40 mg, 40 mg, Intravenous, Q12H, Anders Simmonds, MD, 40 mg at 10/08/18 2329 .  guaiFENesin (MUCINEX) 12 hr tablet 1,200 mg, 1,200 mg, Oral, BID, Gaye Pollack, MD, 1,200 mg at 10/08/18 2135 .  HYDROcodone-acetaminophen (NORCO/VICODIN) 5-325 MG per tablet 1-2 tablet, 1-2 tablet, Oral, Q4H PRN, Gaye Pollack, MD, 1 tablet at 10/08/18 1819 .  ipratropium (ATROVENT) nebulizer solution 0.5 mg, 0.5 mg, Nebulization, TID, [name], MD, 0.5 mg at 10/09/18 0806 .  ipratropium (ATROVENT) nebulizer solution 0.5 mg, 0.5 mg, Nebulization, Q6H PRN, [name], MD, 0.5 mg at 10/06/18 0241 .  levalbuterol (XOPENEX) nebulizer solution 1.25 mg, 1.25 mg, Nebulization, TID, [name], MD, 1.25 mg at 10/09/18 0806 .  levalbuterol (XOPENEX) nebulizer solution 1.25 mg, 1.25 mg, Nebulization, Q6H PRN, [name], MD, 1.25 mg at 10/08/18 0054 .  loratadine (CLARITIN) tablet 10 mg, 10 mg, Oral, Daily, Barrett, Erin R, PA-C, 10 mg  at 10/08/18 1448 .  LORazepam (ATIVAN) tablet 0.5 mg, 0.5 mg, Oral, Q6H PRN, Barrett, Erin R, PA-C, 0.5 mg at 10/08/18 2014 .  MEDLINE mouth rinse, 15 mL, Mouth Rinse, BID, [name], MD, 15 mL at 10/06/18 0919 .  methylPREDNISolone sodium succinate (SOLU-MEDROL) 40 mg/mL injection 40 mg, 40 mg, Intravenous, Q12H, Rigoberto Noel, MD, 40 mg at 10/09/18 0158 .  metoprolol tartrate (LOPRESSOR)  injection 2.5-5 mg, 2.5-5 mg, Intravenous, Q2H PRN, Barrett, Erin R, PA-C, 5 mg at 10/06/18 0742 .  metoprolol tartrate (LOPRESSOR) tablet 25 mg, 25 mg, Oral, BID, 25 mg at 10/08/18 2136 **OR** [DISCONTINUED] metoprolol tartrate (LOPRESSOR) 25 mg/10 mL oral suspension 12.5 mg, 12.5 mg, Per Tube, BID, Crenshaw, Denice Bors, MD .  ondansetron (ZOFRAN) injection 4 mg, 4 mg, Intravenous, Q6H PRN, Barrett, Erin R, PA-C .  pantoprazole (PROTONIX) EC tablet 40 mg, 40 mg, Oral, Daily, Barrett, Erin R, PA-C, 40 mg at 10/08/18 0821 .  potassium chloride SA (K-DUR,KLOR-CON) CR tablet 40 mEq, 40 mEq, Oral, Daily, Barrett, Erin R, PA-C .  sodium chloride (OCEAN) 0.65 % nasal spray 2 spray, 2 spray, Each Nare, BID, Barrett, Erin R, PA-C, 2 spray at 10/08/18 0827 .  sodium chloride flush (NS) 0.9 % injection 10-40 mL, 10-40 mL, Intracatheter, Q12H, [name], MD, 10 mL at 10/07/18 2110 .  sodium chloride flush (NS) 0.9 % injection 10-40 mL, 10-40 mL, Intracatheter, PRN, Lanelle Bal B, MD .  sodium chloride flush (NS) 0.9 % injection 10-40 mL, 10-40 mL, Intracatheter, Q12H, [name], MD, 20 mL at 10/08/18 2135 .  sodium chloride flush (NS) 0.9 % injection 10-40 mL, 10-40 mL, Intracatheter, PRN, [name], MD, 10 mL at 10/08/18 2330 .  sodium chloride flush (NS) 0.9 % injection 3 mL, 3 mL, Intravenous, Q12H, Barrett, Erin R, PA-C, 3 mL at 10/06/18 2108 .  sodium chloride flush (NS) 0.9 % injection 3 mL, 3 mL, Intravenous, PRN, Barrett, Erin R, PA-C .  traMADol (ULTRAM) tablet 50-100 mg, 50-100 mg, Oral, Q4H PRN, Barrett, Erin R, PA-C, 50 mg at 10/07/18 [facility] Events   09/26/2018  admit 09/28/18 - CCM consult pre CABG 10/28 CABG x 3 and left upper lung biopsy/ wedge resection 11/5 BiPAP overnight for increased WOB/ desaturation/ steroids started/ diuresing  11/6 remains on HFNC 15L   Consults: date of consult/date signed off & final recs:  Cardiology  09/28/18 -  ccm consult  Procedures (surgical and bedside):  10/28:CABG x 3 10/28: Wedge resection of LUL with biopsy and cuture  Significant Diagnostic Tests:  10/26 HRCT >> 1. Spectrum of findings compatible with fibrotic interstitial lung disease with moderate air trapping. Chronic hypersensitivity pneumonitis is favored. Minimal progression of bronchiectasis since 02/14/2018 high-resolution chest CT study. Findings are otherwise Stable. 2. Three-vessel coronary atherosclerosis. 3. Aortic Atherosclerosis  Micro Data:  10/24 MRSA PCR >> neg 10/28: Sputum for AFB>> neg 10/28: Sputum for fungal culture/ stain >> 10/28 left lung tissue cx >> no aero/ anaerobic growth 11/1 expectorated sputum >> few serratia marcescens   Surgical pathology>>Dr. Elsworth Soho discussed with pathology, single biopsy from upper lobe seems to be more consistent with fibrotic NSIP, will send out for second opinion. No evidence of collagen vascular disease   Antimicrobials:  Zinacef: 09/30/2018>>10/31 09/30/2018: Vanc x 1 dose Rocephin  10/31>>  SUBJECTIVE/OVERNIGHT/INTERVAL HX  tmax 98.3 Net +163 ml/ -370 ml over last 24 hours (although wt is up 1kg) RN report acute urinary retention, ~  1L on bladder scan Bipap overnight for anxiety/ acute dyspnea after episode of exertion-> to [facility] now on HFNC 15 L Ongoing productive cough States she feels better today than yesterday and off BiPAP, does not feel overly anxious  Objective   Blood pressure 129/78, pulse 99, temperature 98.3 F (36.8 C), temperature source Oral, resp. rate (!) 24, height 5\' 6"  (1.676 m), weight 67.9 kg, SpO2 95 %.    FiO2 (%):  [60 %-70 %] 60 %   Intake/Output Summary (Last 24 hours) at 10/09/2018 1005 Last data filed at 10/09/2018 0800 Gross per 24 hour  Intake 480 ml  Output 600 ml  Net -120 ml   Filed Weights   10/07/18 0500 10/08/18 0546 10/09/18 0500  Weight: 66.1 kg 66.4 kg 67.9 kg    General:  Frail older adult female sitting upright in  bed HEENT: MM pink/moist, pupils 3/reactive, anicteric  Neuro: Alert/ oriented, non focal  CV: SR 102, no mumur PULM: on HFNC 15 L at rest ~93-95, occasional desat into upper 80's while talking, appears dyspneic but speaking full sentences, coarse/ decreased breath sounds, faint wheeze anterior, bibasilar rales  GI: soft, lower suprapubic pressure- still has purwick  Extremities: warm/dry, +1-2 BLE, incisions/ staples  to LE intact/ no drainage Skin: no rashes, bruising to arms, sternotomy site approximated and healing  Resolved Hospital Problem list   10/28>> Extubation  Assessment & Plan:   Acute hypoxic respiratory failure- multifactorial  ILD based on HRCT, fibrotic NSIP per lung biopsy Post-operative CABG and LUL wedge resection 10/28 Serratia Marcescens ( Ceftriaxone sensitive) per respiratory culture Hypervolemia  - CXR 11/6- stable changes - some ongoing anxiety  P:  BiPAP prn  Continue HFNC wean for sats 88-94%, will need more support with exertion  Will increase solumedrol 125mg  q 8 hours and start CBG AC/ HS with sensitive coverage Full liquid diet for now Ongoing diuresis as able  Daily renal panel/ mag Will place foley for acute retention  Ongoing pulm hygiene, IS/ flutter, mucinex Continue xopenex/ atrovent nebs, Breo daily  Ativan prn  Continue claritin, protonix, flonase  Follow lung biopsy sent for second evaluation Continue ceftriaxone Trend WBC/ fever curve- increased leukocytosis likely increased today as response to steroids   Afib with RVR- now resolved, currently in NSR 101 P:  Per Cardiology Restarting digoxin Ongoing lopressor po and prn    NSTEMI s/p CABG x 3  P:  TCTS primary    Transaminitis - new since LFTs 10/24 P:  lipitor held Trend LFTs   Disposition / Summary of Today's Plan 10/09/18   PRN BiPAP as needed  Increased solumedrol to 125mg  q 8hr Added CBG/ SSI Liquid diet today  Ongoing diuresis  Awaiting Lung biopsy sent for  second opinion      Kennieth Rad, AGACNP-BC [place] Pgr: [phone] or if no answer [phone] 10/09/2018, 11:48 AM

## 2018-10-09 NOTE — Progress Notes (Signed)
Patient taken off BiPAP at 0808 and placed on 15L [place]ter nasal cannula. Patient instructed to take slow, deep breaths in through her nose and out through her mouth. Breathing treatment administered. Lungs clear and oxygen saturation 98% at this time. RT will continue to monitor for need of BiPAP.

## 2018-10-09 NOTE — Progress Notes (Addendum)
TCTS DAILY ICU PROGRESS NOTE                   [address]            [place] [address]          [phone]   9 Days Post-Op Procedure(s) (LRB): CORONARY ARTERY BYPASS GRAFTING (CABG) X THREE, LIMA TO LAD, SVG TO DIAGONAL, SVG TO PDA  USING LEFT INTERNAL MAMMERY ARTERY AND RIGHT AND LEFT GREATER SAPHENOUS VEIN (N/A) TRANSESOPHAGEAL ECHOCARDIOGRAM (TEE) (N/A) LEFT UPPER LOBE WEDGE RESECTION (Left)  Total Length of Stay:  LOS: 13 days   Subjective:  Patient without new complaints.  She is on BiPAP this morning with dyspnea.  Objective: Vital signs in last 24 hours: Temp:  [97.6 F (36.4 C)-98.6 F (37 C)] 97.8 F (36.6 C) (11/06 0400) Pulse Rate:  [84-127] 98 (11/06 0600) Cardiac Rhythm: Normal sinus rhythm (11/06 0400) Resp:  [19-35] 28 (11/06 0600) BP: (68-143)/(50-78) 143/77 (11/06 0600) SpO2:  [78 %-99 %] 96 % (11/06 0600) FiO2 (%):  [60 %-70 %] 60 % (11/06 0347) Weight:  [67.9 kg] 67.9 kg (11/06 0500)  Filed Weights   10/07/18 0500 10/08/18 0546 10/09/18 0500  Weight: 66.1 kg 66.4 kg 67.9 kg    Weight change: 1.5 kg   Intake/Output from previous day: 11/05 0701 - 11/06 0700 In: 480 [P.O.:480] Out: 850 [Urine:850]   Current Meds: Scheduled Meds: . aspirin EC  325 mg Oral Daily   Or  . aspirin  324 mg Per Tube Daily  . buPROPion  300 mg Oral q morning - 10a  . Chlorhexidine Gluconate Cloth  6 each Topical Daily  . cholecalciferol  2,000 Units Oral Daily  . enoxaparin (LOVENOX) injection  30 mg Subcutaneous QHS  . feeding supplement  1 Container Oral TID BM  . finasteride  2.5 mg Oral Daily  . fluticasone  2 spray Each Nare BID  . fluticasone furoate-vilanterol  1 puff Inhalation Daily  . furosemide  40 mg Intravenous Q12H  . guaiFENesin  1,200 mg Oral BID  . ipratropium  0.5 mg Nebulization TID  . levalbuterol  1.25 mg Nebulization TID  . loratadine  10 mg Oral Daily  . mouth rinse  15 mL Mouth Rinse BID  . methylPREDNISolone  (SOLU-MEDROL) injection  40 mg Intravenous Q12H  . metoprolol tartrate  25 mg Oral BID  . pantoprazole  40 mg Oral Daily  . sodium chloride  2 spray Each Nare BID  . sodium chloride flush  10-40 mL Intracatheter Q12H  . sodium chloride flush  10-40 mL Intracatheter Q12H  . sodium chloride flush  3 mL Intravenous Q12H   Continuous Infusions: . sodium chloride Stopped (10/07/18 0653)  . cefTRIAXone (ROCEPHIN)  IV Stopped (10/08/18 1901)   PRN Meds:.HYDROcodone-acetaminophen, ipratropium, levalbuterol, LORazepam, metoprolol tartrate, ondansetron (ZOFRAN) IV, sodium chloride flush, sodium chloride flush, sodium chloride flush, traMADol  General appearance: cooperative and no distress Heart: regular rate and rhythm Lungs: rhonchi bilaterally Abdomen: soft, non-tender; bowel sounds normal; no masses,  no organomegaly Extremities: edema 1+, ecchymosis BLE Wound: clean and dry, staples remain in place on LE  Lab Results: CBC: Recent Labs    10/08/18 0423 10/09/18 0501  WBC 13.4* 17.4*  HGB 7.6* 8.0*  HCT 23.7* 23.9*  PLT 349 408*   BMET:  Recent Labs    10/08/18 0423 10/09/18 0501  NA 129* 132*  K 4.0 3.8  CL 96* 94*  CO2 27  32  GLUCOSE 108* 132*  BUN 10 12  CREATININE 0.85 0.86  CALCIUM 8.5* 8.6*    CMET: Lab Results  Component Value Date   WBC 17.4 (H) 10/09/2018   HGB 8.0 (L) 10/09/2018   HCT 23.9 (L) 10/09/2018   PLT 408 (H) 10/09/2018   GLUCOSE 132 (H) 10/09/2018   CHOL 120 09/27/2018   TRIG 68 09/27/2018   HDL 36 (L) 09/27/2018   LDLCALC 70 09/27/2018   ALT 207 (H) 10/09/2018   AST 225 (H) 10/09/2018   NA 132 (L) 10/09/2018   K 3.8 10/09/2018   CL 94 (L) 10/09/2018   CREATININE 0.86 10/09/2018   BUN 12 10/09/2018   CO2 32 10/09/2018   TSH 1.676 09/30/2018   INR 1.43 09/30/2018   HGBA1C 5.3 09/30/2018      PT/INR: No results for input(s): LABPROT, INR in the last 72 hours. Radiology: Dg Chest Port 1 View  Result Date: 10/08/2018 CLINICAL DATA:   Hypoxia. EXAM: PORTABLE CHEST 1 VIEW COMPARISON:  10/08/2018 at 0618 hours FINDINGS: Sequelae of prior CABG are again identified. A right PICC terminates over the lower SVC. The cardiac silhouette is borderline enlarged. Coarse interstitial opacities and scattered patchy airspace opacities in both lungs have not significantly changed. No sizable pleural effusion or pneumothorax is identified. IMPRESSION: Unchanged diffuse bilateral lung opacities which may reflect pneumonia or edema superimposed on chronic interstitial lung disease. Electronically Signed   By: Logan Bores M.D.   On: 10/08/2018 23:28     Assessment/Plan: S/P Procedure(s) (LRB): CORONARY ARTERY BYPASS GRAFTING (CABG) X THREE, LIMA TO LAD, SVG TO DIAGONAL, SVG TO PDA  USING LEFT INTERNAL MAMMERY ARTERY AND RIGHT AND LEFT GREATER SAPHENOUS VEIN (N/A) TRANSESOPHAGEAL ECHOCARDIOGRAM (TEE) (N/A) LEFT UPPER LOBE WEDGE RESECTION (Left)  1. CV- PAF, currently in NSR, off Amiodarone due to lung toxicity- continue Lopressor, Digoxin restarted today by Cardiology 2. Pulm- ILD, NSIP per recent pathology, on BIPAP this morning, sats 93%, CCM following 3. Renal- creatinine WNL, weight is up about 8 lbs, will continue IV diuresis for now, supplement K 4. ID- Serratia Marcescens in sputum, continue Rocephin 5. Expected post operative blood loss anemia, Hgb at 8.0  6. Dispo- patient in NSR this morning, not a candidate for anticoagulation, continue rate control, aggressive pulm measures per CCM, continue current care     Erin Barrett 10/09/2018 7:53 AM  H/h slightly hight today On steroids now per pulmonary  I have seen and examined Nelva Nay and agree with the above assessment  and plan.  [name] MD Beeper [phone] Office [phone] 10/09/2018 8:31 AM

## 2018-10-09 NOTE — Plan of Care (Signed)
  Problem: Education: Goal: Knowledge of General Education information will improve Description Including pain rating scale, medication(s)/side effects and non-pharmacologic comfort measures Outcome: Progressing   Problem: Clinical Measurements: Goal: Ability to maintain clinical measurements within normal limits will improve Outcome: Progressing Goal: Will remain free from infection Outcome: Progressing Goal: Diagnostic test results will improve Outcome: Progressing Goal: Cardiovascular complication will be avoided Outcome: Progressing   Problem: Nutrition: Goal: Adequate nutrition will be maintained Outcome: Progressing   Problem: Coping: Goal: Level of anxiety will decrease Outcome: Progressing   Problem: Elimination: Goal: Will not experience complications related to bowel motility Outcome: Progressing Goal: Will not experience complications related to urinary retention Outcome: Progressing   Problem: Pain Managment: Goal: General experience of comfort will improve Outcome: Progressing   Problem: Safety: Goal: Ability to remain free from injury will improve Outcome: Progressing   Problem: Skin Integrity: Goal: Risk for impaired skin integrity will decrease Outcome: Progressing   Problem: Clinical Measurements: Goal: Respiratory complications will improve Outcome: Not Progressing   Problem: Activity: Goal: Risk for activity intolerance will decrease Outcome: Not Progressing   O2 saturations continue to decline with minimal activity. Patient has coughing spells which decerase O2 sats. Patient has been educated by nurse and is aware that bipap may be necessary during hours of sleep to maintain O2 sats.

## 2018-10-10 ENCOUNTER — Encounter (HOSPITAL_COMMUNITY): Payer: Self-pay | Admitting: Physician Assistant

## 2018-10-10 LAB — COMPREHENSIVE METABOLIC PANEL
ALBUMIN: 2.3 g/dL — AB (ref 3.5–5.0)
ALK PHOS: 289 U/L — AB (ref 38–126)
ALT: 459 U/L — ABNORMAL HIGH (ref 0–44)
ANION GAP: 11 (ref 5–15)
AST: 562 U/L — ABNORMAL HIGH (ref 15–41)
BILIRUBIN TOTAL: 0.5 mg/dL (ref 0.3–1.2)
BUN: 16 mg/dL (ref 8–23)
CALCIUM: 8.5 mg/dL — AB (ref 8.9–10.3)
CO2: 28 mmol/L (ref 22–32)
Chloride: 95 mmol/L — ABNORMAL LOW (ref 98–111)
Creatinine, Ser: 0.89 mg/dL (ref 0.44–1.00)
GLUCOSE: 131 mg/dL — AB (ref 70–99)
Potassium: 3.6 mmol/L (ref 3.5–5.1)
Sodium: 134 mmol/L — ABNORMAL LOW (ref 135–145)
TOTAL PROTEIN: 5.8 g/dL — AB (ref 6.5–8.1)

## 2018-10-10 LAB — GLUCOSE, CAPILLARY
Glucose-Capillary: 149 mg/dL — ABNORMAL HIGH (ref 70–99)
Glucose-Capillary: 146 mg/dL — ABNORMAL HIGH (ref 70–99)
Glucose-Capillary: 141 mg/dL — ABNORMAL HIGH (ref 70–99)
Glucose-Capillary: 145 mg/dL — ABNORMAL HIGH (ref 70–99)

## 2018-10-10 LAB — PREPARE RBC (CROSSMATCH)

## 2018-10-10 LAB — CBC
HCT: 23.4 % — ABNORMAL LOW (ref 36.0–46.0)
Hemoglobin: 7.5 g/dL — ABNORMAL LOW (ref 12.0–15.0)
MCH: 31.4 pg (ref 26.0–34.0)
MCHC: 32.1 g/dL (ref 30.0–36.0)
MCV: 97.9 fL (ref 80.0–100.0)
PLATELETS: 501 10*3/uL — AB (ref 150–400)
RBC: 2.39 MIL/uL — AB (ref 3.87–5.11)
RDW: 12.6 % (ref 11.5–15.5)
WBC: 20.4 10*3/uL — AB (ref 4.0–10.5)
nRBC: 0.2 % (ref 0.0–0.2)

## 2018-10-10 LAB — MAGNESIUM: MAGNESIUM: 2 mg/dL (ref 1.7–2.4)

## 2018-10-10 MED ORDER — METOPROLOL TARTRATE 25 MG PO TABS
25.0000 mg | ORAL_TABLET | Freq: Three times a day (TID) | ORAL | Status: DC
  Administered 2018-10-10 – 2018-10-22 (×32): 25 mg via ORAL
  Filled 2018-10-10 (×35): qty 1

## 2018-10-10 MED ORDER — SODIUM CHLORIDE 0.9% IV SOLUTION
Freq: Once | INTRAVENOUS | Status: AC
  Administered 2018-10-10: 12:00:00 via INTRAVENOUS

## 2018-10-10 MED ORDER — FUROSEMIDE 10 MG/ML IJ SOLN
40.0000 mg | Freq: Once | INTRAMUSCULAR | Status: AC
  Administered 2018-10-10: 40 mg via INTRAVENOUS
  Filled 2018-10-10: qty 4

## 2018-10-10 NOTE — Progress Notes (Signed)
Nurse observed O2 saturation decreased to 80%. Nurse went into patient's room and observed the patient had removed bipap. Patient stated that the doctor told her that if she was tired of the bipap, she could take a break from it. Nurse re-educated the patient and explained that the bipap was in use because her O2 saturation was decreasing on HFNC. Nurse explained the necessity of wearing until her O2 saturations stabilized. Nurse did place HFNC on patient, however, O2 saturations continued to decrease. After a few minutes of discussion, patient allowed nurse to place bipap back on. O2 saturations increased to 98%.

## 2018-10-10 NOTE — Progress Notes (Signed)
CTS PM Rounds   Received 1 U PRBCs , lasix for HB 7.5 OOB to commode O2 90% on hi flow O2- BIPAP tonite

## 2018-10-10 NOTE — Plan of Care (Signed)
Pt progressing with coping skills and breathing techniques

## 2018-10-10 NOTE — Progress Notes (Addendum)
TCTS DAILY ICU PROGRESS NOTE                   [address]            [place] [address]          [phone]   10 Days Post-Op Procedure(s) (LRB): CORONARY ARTERY BYPASS GRAFTING (CABG) X THREE, LIMA TO LAD, SVG TO DIAGONAL, SVG TO PDA  USING LEFT INTERNAL MAMMERY ARTERY AND RIGHT AND LEFT GREATER SAPHENOUS VEIN (N/A) TRANSESOPHAGEAL ECHOCARDIOGRAM (TEE) (N/A) LEFT UPPER LOBE WEDGE RESECTION (Left)  Total Length of Stay:  LOS: 14 days   Subjective:  Remains short of breath.  Sitting up in bed this morning, off BIPAP.  On oxygen at 15L with sats in low 90s.  She does desat with conversation.  Objective: Vital signs in last 24 hours: Temp:  [97.8 F (36.6 C)-98.9 F (37.2 C)] 97.8 F (36.6 C) (11/07 0700) Pulse Rate:  [85-112] 103 (11/07 0700) Cardiac Rhythm: Normal sinus rhythm (11/07 0400) Resp:  [15-35] 22 (11/07 0700) BP: (111-145)/(59-98) 120/78 (11/07 0700) SpO2:  [88 %-100 %] 99 % (11/07 0700) Weight:  [66.3 kg] 66.3 kg (11/07 0600)  Filed Weights   10/08/18 0546 10/09/18 0500 10/10/18 0600  Weight: 66.4 kg 67.9 kg 66.3 kg    Weight change: -1.6 kg   Intake/Output from previous day: 11/06 0701 - 11/07 0700 In: 974 [P.O.:774; IV Piggyback:200] Out: 3125 [Urine:3125]  Current Meds: Scheduled Meds: . aspirin EC  325 mg Oral Daily   Or  . aspirin  324 mg Per Tube Daily  . buPROPion  300 mg Oral q morning - 10a  . Chlorhexidine Gluconate Cloth  6 each Topical Daily  . cholecalciferol  2,000 Units Oral Daily  . digoxin  0.125 mg Oral Daily  . enoxaparin (LOVENOX) injection  30 mg Subcutaneous QHS  . feeding supplement  1 Container Oral TID BM  . finasteride  2.5 mg Oral Daily  . fluticasone  2 spray Each Nare BID  . fluticasone furoate-vilanterol  1 puff Inhalation Daily  . furosemide  40 mg Intravenous Q12H  . guaiFENesin  1,200 mg Oral BID  . insulin aspart  0-9 Units Subcutaneous TID WC  . ipratropium  0.5 mg Nebulization TID  .  levalbuterol  1.25 mg Nebulization TID  . loratadine  10 mg Oral Daily  . mouth rinse  15 mL Mouth Rinse BID  . methylPREDNISolone (SOLU-MEDROL) injection  125 mg Intravenous Q8H  . metoprolol tartrate  25 mg Oral Q8H  . pantoprazole  40 mg Oral Daily  . potassium chloride  40 mEq Oral Daily  . sodium chloride  2 spray Each Nare BID  . sodium chloride flush  10-40 mL Intracatheter Q12H  . sodium chloride flush  10-40 mL Intracatheter Q12H  . sodium chloride flush  3 mL Intravenous Q12H   Continuous Infusions: . sodium chloride Stopped (10/07/18 0653)  . cefTRIAXone (ROCEPHIN)  IV 2 g (10/09/18 1418)   PRN Meds:.HYDROcodone-acetaminophen, ipratropium, levalbuterol, LORazepam, metoprolol tartrate, ondansetron (ZOFRAN) IV, sodium chloride flush, sodium chloride flush, sodium chloride flush, traMADol  General appearance: alert, cooperative and no distress Heart: irregularly irregular rhythm Lungs: rhonchi bilaterally Abdomen: soft, non-tender; bowel sounds normal; no masses,  no organomegaly Extremities: edema 1-2+ pitting Wound: clean and dry, ecchymosis present  Lab Results: CBC: Recent Labs    10/09/18 0501 10/10/18 0331  WBC 17.4* 20.4*  HGB 8.0* 7.5*  HCT 23.9* 23.4*  PLT  408* 501*   BMET:  Recent Labs    10/09/18 0501 10/10/18 0331  NA 132* 134*  K 3.8 3.6  CL 94* 95*  CO2 32 28  GLUCOSE 132* 131*  BUN 12 16  CREATININE 0.86 0.89  CALCIUM 8.6* 8.5*    CMET: Lab Results  Component Value Date   WBC 20.4 (H) 10/10/2018   HGB 7.5 (L) 10/10/2018   HCT 23.4 (L) 10/10/2018   PLT 501 (H) 10/10/2018   GLUCOSE 131 (H) 10/10/2018   CHOL 120 09/27/2018   TRIG 68 09/27/2018   HDL 36 (L) 09/27/2018   LDLCALC 70 09/27/2018   ALT 459 (H) 10/10/2018   AST 562 (H) 10/10/2018   NA 134 (L) 10/10/2018   K 3.6 10/10/2018   CL 95 (L) 10/10/2018   CREATININE 0.89 10/10/2018   BUN 16 10/10/2018   CO2 28 10/10/2018   TSH 1.676 09/30/2018   INR 1.43 09/30/2018   HGBA1C  5.3 09/30/2018      PT/INR: No results for input(s): LABPROT, INR in the last 72 hours. Radiology: No results found.   Assessment/Plan: S/P Procedure(s) (LRB): CORONARY ARTERY BYPASS GRAFTING (CABG) X THREE, LIMA TO LAD, SVG TO DIAGONAL, SVG TO PDA  USING LEFT INTERNAL MAMMERY ARTERY AND RIGHT AND LEFT GREATER SAPHENOUS VEIN (N/A) TRANSESOPHAGEAL ECHOCARDIOGRAM (TEE) (N/A) LEFT UPPER LOBE WEDGE RESECTION (Left)  1. CV- in A. Fib this morning- Lopressor increased to 25 mg TID per cardiology, remains on Digoxin.. Not a candidate for anticoagulation  2. Pulm- ILD, NSIP- off BIPAP this morning, on high flow [place] at 15L, sats in the low 90s, care per pulmonary 3. Renal- creatinine remains stable, continue diuretics, supplement K 4. ID-Serratia Marcescens in sputum on Rocephin, + leukocytosis 5. Expected post operative blood loss anemia, Hgb at 7.7.Marland KitchenMarland Kitchen May benefit from transfusion with persistent Atrial Fibrillation 6. Dispo- patient back in A. Fib this morning, BB titrated per Cardiology, not a candidate for anticoagulation, not currently on BIPAP, on HFNC at 15L continue aggressive pulmonary measures per Pulmonary, continue Lasix, ABX, Hgb at 7.7 may benefit from transfusion, continue current care     Erin Barrett 10/10/2018 7:42 AM   Back in afib, breathing little better today  Wbc up since startting steroids  Transfuse one unit prbc's  I have seen and examined Sarah Escobar and agree with the above assessment  and plan.  [name] MD Beeper [phone] Office [phone] 10/10/2018 8:58 AM

## 2018-10-10 NOTE — Progress Notes (Signed)
Progress Note  Patient Name: JAQUIA BENEDICTO Date of Encounter: 10/10/2018  Primary Cardiologist: Dorris Carnes, MD   Subjective   Remains dyspneic; no CP  Inpatient Medications    Scheduled Meds: . aspirin EC  325 mg Oral Daily   Or  . aspirin  324 mg Per Tube Daily  . buPROPion  300 mg Oral q morning - 10a  . Chlorhexidine Gluconate Cloth  6 each Topical Daily  . cholecalciferol  2,000 Units Oral Daily  . digoxin  0.125 mg Oral Daily  . enoxaparin (LOVENOX) injection  30 mg Subcutaneous QHS  . feeding supplement  1 Container Oral TID BM  . finasteride  2.5 mg Oral Daily  . fluticasone  2 spray Each Nare BID  . fluticasone furoate-vilanterol  1 puff Inhalation Daily  . furosemide  40 mg Intravenous Q12H  . guaiFENesin  1,200 mg Oral BID  . insulin aspart  0-9 Units Subcutaneous TID WC  . ipratropium  0.5 mg Nebulization TID  . levalbuterol  1.25 mg Nebulization TID  . loratadine  10 mg Oral Daily  . mouth rinse  15 mL Mouth Rinse BID  . methylPREDNISolone (SOLU-MEDROL) injection  125 mg Intravenous Q8H  . metoprolol tartrate  25 mg Oral BID  . pantoprazole  40 mg Oral Daily  . potassium chloride  40 mEq Oral Daily  . sodium chloride  2 spray Each Nare BID  . sodium chloride flush  10-40 mL Intracatheter Q12H  . sodium chloride flush  10-40 mL Intracatheter Q12H  . sodium chloride flush  3 mL Intravenous Q12H   Continuous Infusions: . sodium chloride Stopped (10/07/18 0653)  . cefTRIAXone (ROCEPHIN)  IV 2 g (10/09/18 1418)   PRN Meds: HYDROcodone-acetaminophen, ipratropium, levalbuterol, LORazepam, metoprolol tartrate, ondansetron (ZOFRAN) IV, sodium chloride flush, sodium chloride flush, sodium chloride flush, traMADol   Vital Signs    Vitals:   10/10/18 0300 10/10/18 0400 10/10/18 0500 10/10/18 0600  BP: 119/73 116/71 130/88 (!) 138/98  Pulse: 91 (!) 112 (!) 110 (!) 106  Resp: (!) 24 (!) 32 (!) 21 15  Temp:  97.9 F (36.6 C)    TempSrc:  Oral    SpO2: (!)  89% 92% 100% 100%  Weight:    66.3 kg  Height:        Intake/Output Summary (Last 24 hours) at 10/10/2018 0701 Last data filed at 10/10/2018 0600 Gross per 24 hour  Intake 974 ml  Output 3125 ml  Net -2151 ml   Filed Weights   10/08/18 0546 10/09/18 0500 10/10/18 0600  Weight: 66.4 kg 67.9 kg 66.3 kg    Telemetry   Sinus with PAF and PVCs; in atrial fibrillation this AM - Personally Reviewed   Physical Exam   GEN: Dyspneic; off Bipap Neck: supple Cardiac: rregular Respiratory: diffuse dry crackles GI: Soft, no RUQ tenderness MS: trace edema; harvest sites without evidence of infection Neuro: Grossly intact   Labs    Chemistry Recent Labs  Lab 10/08/18 0423 10/09/18 0501 10/10/18 0331  NA 129* 132* 134*  K 4.0 3.8 3.6  CL 96* 94* 95*  CO2 27 32 28  GLUCOSE 108* 132* 131*  BUN 10 12 16   CREATININE 0.85 0.86 0.89  CALCIUM 8.5* 8.6* 8.5*  PROT  --  6.0* 5.8*  ALBUMIN  --  2.4* 2.3*  AST  --  225* 562*  ALT  --  207* 459*  ALKPHOS  --  299* 289*  BILITOT  --  0.6 0.5  GFRNONAA >60 >60 >60  GFRAA >60 >60 >60  ANIONGAP 6 6 11      Hematology Recent Labs  Lab 10/08/18 0423 10/09/18 0501 10/10/18 0331  WBC 13.4* 17.4* 20.4*  RBC 2.42* 2.48* 2.39*  HGB 7.6* 8.0* 7.5*  HCT 23.7* 23.9* 23.4*  MCV 97.9 96.4 97.9  MCH 31.4 32.3 31.4  MCHC 32.1 33.5 32.1  RDW 12.5 12.4 12.6  PLT 349 408* 501*    Radiology    Dg Chest Port 1 View  Result Date: 10/09/2018 CLINICAL DATA:  Shortness of breath. EXAM: PORTABLE CHEST 1 VIEW COMPARISON:  Radiograph of October 08, 2018. FINDINGS: Stable cardiomediastinal silhouette. Status post coronary bypass graft. No pneumothorax or significant pleural effusion is noted. Stable reticular densities are noted throughout both lungs which may represent edema or inflammation superimposed upon chronic interstitial densities. Bony thorax is unremarkable. Right-sided PICC line is unchanged in position. IMPRESSION: Stable bilateral  lung opacities as described above. Electronically Signed   By: Marijo Conception, M.D.   On: 10/09/2018 09:28   Dg Chest Port 1 View  Result Date: 10/08/2018 CLINICAL DATA:  Hypoxia. EXAM: PORTABLE CHEST 1 VIEW COMPARISON:  10/08/2018 at 0618 hours FINDINGS: Sequelae of prior CABG are again identified. A right PICC terminates over the lower SVC. The cardiac silhouette is borderline enlarged. Coarse interstitial opacities and scattered patchy airspace opacities in both lungs have not significantly changed. No sizable pleural effusion or pneumothorax is identified. IMPRESSION: Unchanged diffuse bilateral lung opacities which may reflect pneumonia or edema superimposed on chronic interstitial lung disease. Electronically Signed   By: Logan Bores M.D.   On: 10/08/2018 23:28    Patient Profile     62 y.o. female with past medical history of interstitial lung disease admitted with non-ST elevation myocardial infarction.  Cardiac catheterization revealed ostial LAD and mid RCA lesion.  Normal LV function.  Echocardiogram showed normal LV function.  Patient is now status post coronary artery bypass and graft and left upper lobe wedge resection.  Assessment & Plan    1 coronary artery disease status post coronary artery bypass and graft/non-ST elevation myocardial infarction-Plan to continue ASA; statin on hold due to elevated LFTs.  2 postoperative atrial fibrillation-Back in atrial fibrillation this AM. Amiodarone DCed previously due to potential lung toxicity. Given recent amiodarone load, cannot use sotalol or tikosyn. Plan is rate control. Increase lopressor to 25 mg TID. Continue digoxin. She has not been anticoagulated due to lung disease and risk of recurrent hemoptysis. Continue ASA.  3 interstitial lung disease-NSIP noted on pathology; pulmonary managing; continue steroids (solumedrol increased yesterday) and antibiotics.   4 postoperative volume excess-continue diuresis; follow Na and renal  function.  5 Elevated LFTs-Possibly related to amiodarone which was DCed; lipitor on hold; no RUQ tenderness; follow.  For questions or updates, please contact [place] Please consult [email or web address] for contact info under        Signed, Kirk Ruths, MD  10/10/2018, 7:01 AM

## 2018-10-10 NOTE — Progress Notes (Addendum)
62 year old woman with interstitial lung disease who remains severely hypoxic post CABG.  She continues to have runs of atrial fibrillation Used BiPAP for 2 to 3 hours overnight, back on high flow nasal cannula now  On exam-anxious appearing, slightly lethargic today, bilateral scattered crackles, S1-S2 tacky, sinus rhythm on monitor, 1+ edema, soft and nontender abdomen.  Chest x-ray personally reviewed which shows bilateral interstitial infiltrates.  Labs show mild anemia with hemoglobin dropped to 7.5, improving hyponatremia and leukocytosis likely due to steroids.  Impression/plan  Acute hypoxic respiratory failure-initially attributed to volume overload post CABG but she continues to be hypoxic in spite of diuresis, continue high flow nasal cannula, I try to down titrate at the bedside but she desaturates when dropped to 12 L Use BiPAP as needed for work of breathing and during sleep.  ILD-favor fibrotic NSIP clinically but discussed with pathology and second opinion from [place] was read as UIP note that this was biopsy from upper lobe and clinically this would not be consistent with IPF but I agree that this is behaving more like IPF flare after surgery. Have increased Solu-Medrol to 125 every 8 for 72 hours to see if there is response  H CAP Serratia-continue ceftriaxone for 10 to 14 days, leukocytosis is due to steroids  Postop atrial fibrillation-agree with strategy of rate control, okay to anticoagulate in my opinion 1 unit PRBC will be transfused and Lasix 40 every 12 will be continued for negative balance  Husband updated at bedside, they are a little depressed due to lack of progress, understandably so.  My critical care time x 76m  Kara Mead MD. FCCP. [facility] Pager [phone] If no response call [phone]   10/10/2018

## 2018-10-11 ENCOUNTER — Inpatient Hospital Stay (HOSPITAL_COMMUNITY): Payer: 59

## 2018-10-11 DIAGNOSIS — J9621 Acute and chronic respiratory failure with hypoxia: Secondary | ICD-10-CM

## 2018-10-11 LAB — COMPREHENSIVE METABOLIC PANEL
ALT: 464 U/L — AB (ref 0–44)
AST: 347 U/L — AB (ref 15–41)
Albumin: 2.4 g/dL — ABNORMAL LOW (ref 3.5–5.0)
Alkaline Phosphatase: 257 U/L — ABNORMAL HIGH (ref 38–126)
Anion gap: 9 (ref 5–15)
BILIRUBIN TOTAL: 0.7 mg/dL (ref 0.3–1.2)
BUN: 20 mg/dL (ref 8–23)
CO2: 33 mmol/L — ABNORMAL HIGH (ref 22–32)
CREATININE: 0.91 mg/dL (ref 0.44–1.00)
Calcium: 8.3 mg/dL — ABNORMAL LOW (ref 8.9–10.3)
Chloride: 91 mmol/L — ABNORMAL LOW (ref 98–111)
Glucose, Bld: 137 mg/dL — ABNORMAL HIGH (ref 70–99)
Potassium: 3.1 mmol/L — ABNORMAL LOW (ref 3.5–5.1)
Sodium: 133 mmol/L — ABNORMAL LOW (ref 135–145)
TOTAL PROTEIN: 5.9 g/dL — AB (ref 6.5–8.1)

## 2018-10-11 LAB — TYPE AND SCREEN
ABO/RH(D): O POS
Antibody Screen: NEGATIVE
Unit division: 0

## 2018-10-11 LAB — BPAM RBC
Blood Product Expiration Date: 201912062359
ISSUE DATE / TIME: 201911071225
Unit Type and Rh: 5100

## 2018-10-11 LAB — CBC
HCT: 27.5 % — ABNORMAL LOW (ref 36.0–46.0)
Hemoglobin: 9.1 g/dL — ABNORMAL LOW (ref 12.0–15.0)
MCH: 31.2 pg (ref 26.0–34.0)
MCHC: 33.1 g/dL (ref 30.0–36.0)
MCV: 94.2 fL (ref 80.0–100.0)
Platelets: 472 10*3/uL — ABNORMAL HIGH (ref 150–400)
RBC: 2.92 MIL/uL — ABNORMAL LOW (ref 3.87–5.11)
RDW: 14.4 % (ref 11.5–15.5)
WBC: 21.7 10*3/uL — ABNORMAL HIGH (ref 4.0–10.5)
nRBC: 0.3 % — ABNORMAL HIGH (ref 0.0–0.2)

## 2018-10-11 LAB — GLUCOSE, CAPILLARY
Glucose-Capillary: 158 mg/dL — ABNORMAL HIGH (ref 70–99)
Glucose-Capillary: 109 mg/dL — ABNORMAL HIGH (ref 70–99)
Glucose-Capillary: 149 mg/dL — ABNORMAL HIGH (ref 70–99)

## 2018-10-11 MED ORDER — POTASSIUM CHLORIDE 10 MEQ/50ML IV SOLN
10.0000 meq | INTRAVENOUS | Status: AC
  Administered 2018-10-11 (×3): 10 meq via INTRAVENOUS
  Filled 2018-10-11 (×2): qty 50

## 2018-10-11 MED ORDER — ENSURE ENLIVE PO LIQD
237.0000 mL | Freq: Three times a day (TID) | ORAL | Status: DC
  Administered 2018-10-11 – 2018-10-13 (×4): 237 mL via ORAL

## 2018-10-11 NOTE — Progress Notes (Signed)
      [address]       [place] [address]             [phone]      POD # 11 CABG x 3 Resting comfortably Was able walk in hallway earlier BP 132/73   Pulse 76   Temp 98.3 F (36.8 C) (Oral)   Resp 20   Ht 5\' 6"  (1.676 m)   Wt 60.2 kg   SpO2 94%   BMI 21.42 kg/m  Still on 8L HFNC  Intake/Output Summary (Last 24 hours) at 10/11/2018 1814 Last data filed at 10/11/2018 1500 Gross per 24 hour  Intake 1596.74 ml  Output 2300 ml  Net -703.26 ml   Continue current Rx   C. Roxan Hockey, MD [facility] [phone]

## 2018-10-11 NOTE — Care Management Note (Addendum)
Case Management Note  Patient Details  Name: Sarah Escobar MRN: [id number] Date of Birth: 04-Dec-1956  Subjective/Objective: 62yo female presented with CP; s/p CABG                  Action/Plan: CM met with patient/spouse to discuss transitional needs. Patient lived at home with spouse PTA, employed with [employer], [place], with no DME in use. PCP verified as: Dr. Lew Dawes; pharmacy of choice: [facility]. Patient s/p CABG and remains on HFNC; will watch for possible home O2 needs. Patient/spouse denied any CM needs at this time, with CM team to continue to follow.  Expected Discharge Date:                  Expected Discharge Plan:  Home/Self Care  In-House Referral:  NA  Discharge planning Services  CM Consult  Post Acute Care Choice:  Durable Medical Equipment Choice offered to:  Spouse, Patient  DME Arranged:  Oxygen DME Agency:  [place]:  NA [place] Agency:  NA  Status of Service:  In process, will continue to follow  If discussed at Long Length of Stay Meetings, dates discussed:    Additional Comments: 10/17/18 @ 1248-Mackenize Delgadillo RNCM-Home O2 orders entered by attending. CM met with patient/spouse, with DME preference list provided and [employer] selected to service patient's home O2; AVS updated. CM spoke to [place], [facility] liaison via phone and discussed referral. Patient will tentatively transition home over the weekend; home O2 will be delivered closer to that time. CM team will continue to follow.   Midge Minium RN, BSN, NCM-BC, ACM-RN [phone] 10/11/2018, 3:51 PM

## 2018-10-11 NOTE — Progress Notes (Signed)
Patient ID: Sarah Escobar, female   DOB: 03-Feb-1956, 62 y.o.   MRN: [id number] TCTS DAILY ICU PROGRESS NOTE                   [address]            [place] [address]          [phone]   11 Days Post-Op Procedure(s) (LRB): CORONARY ARTERY BYPASS GRAFTING (CABG) X THREE, LIMA TO LAD, SVG TO DIAGONAL, SVG TO PDA  USING LEFT INTERNAL MAMMERY ARTERY AND RIGHT AND LEFT GREATER SAPHENOUS VEIN (N/A) TRANSESOPHAGEAL ECHOCARDIOGRAM (TEE) (N/A) LEFT UPPER LOBE WEDGE RESECTION (Left)  Total Length of Stay:  LOS: 15 days   Subjective: Patient up to chair eating some breakfast, in sinus rhythm this morning  Objective: Vital signs in last 24 hours: Temp:  [98 F (36.7 C)-98.6 F (37 C)] 98.4 F (36.9 C) (11/08 0400) Pulse Rate:  [78-112] 81 (11/08 0700) Cardiac Rhythm: Normal sinus rhythm (11/08 0400) Resp:  [15-27] 26 (11/08 0700) BP: (110-135)/(64-96) 113/83 (11/08 0700) SpO2:  [75 %-100 %] 94 % (11/08 0700) Weight:  [60.2 kg] 60.2 kg (11/08 0600)  Filed Weights   10/09/18 0500 10/10/18 0600 10/11/18 0600  Weight: 67.9 kg 66.3 kg 60.2 kg    Weight change: -6.1 kg   Hemodynamic parameters for last 24 hours:    Intake/Output from previous day: 11/07 0701 - 11/08 0700 In: 1240.6 [P.O.:520; I.V.:10.1; Blood:601; IV Piggyback:109.5] Out: 3301 [Urine:3300; Stool:1]  Intake/Output this shift: No intake/output data recorded.  Current Meds: Scheduled Meds: . aspirin EC  325 mg Oral Daily   Or  . aspirin  324 mg Per Tube Daily  . buPROPion  300 mg Oral q morning - 10a  . Chlorhexidine Gluconate Cloth  6 each Topical Daily  . cholecalciferol  2,000 Units Oral Daily  . digoxin  0.125 mg Oral Daily  . enoxaparin (LOVENOX) injection  30 mg Subcutaneous QHS  . feeding supplement  1 Container Oral TID BM  . finasteride  2.5 mg Oral Daily  . fluticasone  2 spray Each Nare BID  . fluticasone furoate-vilanterol  1 puff Inhalation Daily  . furosemide  40 mg  Intravenous Q12H  . guaiFENesin  1,200 mg Oral BID  . insulin aspart  0-9 Units Subcutaneous TID WC  . ipratropium  0.5 mg Nebulization TID  . levalbuterol  1.25 mg Nebulization TID  . loratadine  10 mg Oral Daily  . mouth rinse  15 mL Mouth Rinse BID  . methylPREDNISolone (SOLU-MEDROL) injection  125 mg Intravenous Q8H  . metoprolol tartrate  25 mg Oral Q8H  . pantoprazole  40 mg Oral Daily  . potassium chloride  40 mEq Oral Daily  . sodium chloride  2 spray Each Nare BID   Continuous Infusions: . sodium chloride Stopped (10/08/18 1546)  . cefTRIAXone (ROCEPHIN)  IV Stopped (10/10/18 1521)  . potassium chloride 50 mL/hr at 10/11/18 0700   PRN Meds:.HYDROcodone-acetaminophen, ipratropium, levalbuterol, LORazepam, metoprolol tartrate, ondansetron (ZOFRAN) IV, sodium chloride flush, traMADol  General appearance: alert and cooperative Neurologic: intact Heart: regular rate and rhythm, S1, S2 normal, no murmur, click, rub or gallop Lungs: diminished breath sounds bibasilar Abdomen: soft, non-tender; bowel sounds normal; no masses,  no organomegaly Extremities: extremities normal, atraumatic, no cyanosis or edema and Homans sign is negative, no sign of DVT Wound: Sternal incision intact  Lab Results: CBC: Recent Labs    10/10/18 0331 10/11/18 0436  WBC 20.4* 21.7*  HGB 7.5* 9.1*  HCT 23.4* 27.5*  PLT 501* 472*   BMET:  Recent Labs    10/10/18 0331 10/11/18 0436  NA 134* 133*  K 3.6 3.1*  CL 95* 91*  CO2 28 33*  GLUCOSE 131* 137*  BUN 16 20  CREATININE 0.89 0.91  CALCIUM 8.5* 8.3*    CMET: Lab Results  Component Value Date   WBC 21.7 (H) 10/11/2018   HGB 9.1 (L) 10/11/2018   HCT 27.5 (L) 10/11/2018   PLT 472 (H) 10/11/2018   GLUCOSE 137 (H) 10/11/2018   CHOL 120 09/27/2018   TRIG 68 09/27/2018   HDL 36 (L) 09/27/2018   LDLCALC 70 09/27/2018   ALT 464 (H) 10/11/2018   AST 347 (H) 10/11/2018   NA 133 (L) 10/11/2018   K 3.1 (L) 10/11/2018   CL 91 (L)  10/11/2018   CREATININE 0.91 10/11/2018   BUN 20 10/11/2018   CO2 33 (H) 10/11/2018   TSH 1.676 09/30/2018   INR 1.43 09/30/2018   HGBA1C 5.3 09/30/2018      PT/INR: No results for input(s): LABPROT, INR in the last 72 hours. Radiology: No results found.   Assessment/Plan: S/P Procedure(s) (LRB): CORONARY ARTERY BYPASS GRAFTING (CABG) X THREE, LIMA TO LAD, SVG TO DIAGONAL, SVG TO PDA  USING LEFT INTERNAL MAMMERY ARTERY AND RIGHT AND LEFT GREATER SAPHENOUS VEIN (N/A) TRANSESOPHAGEAL ECHOCARDIOGRAM (TEE) (N/A) LEFT UPPER LOBE WEDGE RESECTION (Left) Mobilize Diuresis Replacing potassium LFTs remain elevated, off statin amnio Holding sinus rhythm Respiratory status still borderline but slightly improved from yesterday    [name] 10/11/2018 8:11 AM

## 2018-10-11 NOTE — Progress Notes (Signed)
Progress Note  Patient Name: Sarah Escobar Date of Encounter: 10/11/2018  Primary Cardiologist: Dorris Carnes, MD   Subjective   Dyspnea slightly improved; no Chest pain  Inpatient Medications    Scheduled Meds: . aspirin EC  325 mg Oral Daily   Or  . aspirin  324 mg Per Tube Daily  . buPROPion  300 mg Oral q morning - 10a  . Chlorhexidine Gluconate Cloth  6 each Topical Daily  . cholecalciferol  2,000 Units Oral Daily  . digoxin  0.125 mg Oral Daily  . enoxaparin (LOVENOX) injection  30 mg Subcutaneous QHS  . feeding supplement  1 Container Oral TID BM  . finasteride  2.5 mg Oral Daily  . fluticasone  2 spray Each Nare BID  . fluticasone furoate-vilanterol  1 puff Inhalation Daily  . furosemide  40 mg Intravenous Q12H  . guaiFENesin  1,200 mg Oral BID  . insulin aspart  0-9 Units Subcutaneous TID WC  . ipratropium  0.5 mg Nebulization TID  . levalbuterol  1.25 mg Nebulization TID  . loratadine  10 mg Oral Daily  . mouth rinse  15 mL Mouth Rinse BID  . methylPREDNISolone (SOLU-MEDROL) injection  125 mg Intravenous Q8H  . metoprolol tartrate  25 mg Oral Q8H  . pantoprazole  40 mg Oral Daily  . potassium chloride  40 mEq Oral Daily  . sodium chloride  2 spray Each Nare BID   Continuous Infusions: . sodium chloride Stopped (10/08/18 1546)  . cefTRIAXone (ROCEPHIN)  IV Stopped (10/10/18 1521)  . potassium chloride 50 mL/hr at 10/11/18 0700   PRN Meds: HYDROcodone-acetaminophen, ipratropium, levalbuterol, LORazepam, metoprolol tartrate, ondansetron (ZOFRAN) IV, sodium chloride flush, traMADol   Vital Signs    Vitals:   10/11/18 0400 10/11/18 0500 10/11/18 0600 10/11/18 0700  BP: 121/72 135/79 126/76 113/83  Pulse: 78 79 81 81  Resp: 18 (!) 22 19 (!) 26  Temp: 98.4 F (36.9 C)     TempSrc: Oral     SpO2: 100% 95% 98% 94%  Weight:   60.2 kg   Height:        Intake/Output Summary (Last 24 hours) at 10/11/2018 0743 Last data filed at 10/11/2018 0700 Gross per 24  hour  Intake 1240.59 ml  Output 3301 ml  Net -2060.41 ml   Filed Weights   10/09/18 0500 10/10/18 0600 10/11/18 0600  Weight: 67.9 kg 66.3 kg 60.2 kg    Telemetry   PAF converting to sinus - Personally Reviewed   Physical Exam   GEN: mildly dyspneic; eating breakfast Neck: supple, no JVD Cardiac: RRR Respiratory: basilar dry crackles GI: Soft, NT/ND MS: trace edema Neuro: No focal findings   Labs    Chemistry Recent Labs  Lab 10/09/18 0501 10/10/18 0331 10/11/18 0436  NA 132* 134* 133*  K 3.8 3.6 3.1*  CL 94* 95* 91*  CO2 32 28 33*  GLUCOSE 132* 131* 137*  BUN 12 16 20   CREATININE 0.86 0.89 0.91  CALCIUM 8.6* 8.5* 8.3*  PROT 6.0* 5.8* 5.9*  ALBUMIN 2.4* 2.3* 2.4*  AST 225* 562* 347*  ALT 207* 459* 464*  ALKPHOS 299* 289* 257*  BILITOT 0.6 0.5 0.7  GFRNONAA >60 >60 >60  GFRAA >60 >60 >60  ANIONGAP 6 11 9      Hematology Recent Labs  Lab 10/09/18 0501 10/10/18 0331 10/11/18 0436  WBC 17.4* 20.4* 21.7*  RBC 2.48* 2.39* 2.92*  HGB 8.0* 7.5* 9.1*  HCT 23.9* 23.4* 27.5*  MCV 96.4 97.9 94.2  MCH 32.3 31.4 31.2  MCHC 33.5 32.1 33.1  RDW 12.4 12.6 14.4  PLT 408* 501* 472*    Patient Profile     62 y.o. female with past medical history of interstitial lung disease admitted with non-ST elevation myocardial infarction.  Cardiac catheterization revealed ostial LAD and mid RCA lesion.  Normal LV function.  Echocardiogram showed normal LV function.  Patient is now status post coronary artery bypass and graft and left upper lobe wedge resection.  Assessment & Plan    1 coronary artery disease status post coronary artery bypass and graft/non-ST elevation myocardial infarction-continue aspirin.  Statin on hold because of increased liver functions.  Will resume prior to discharge.  2 postoperative atrial fibrillation-patient is back in sinus rhythm this morning.  Amiodarone discontinued previously because of potential pulmonary toxicity and elevated liver  functions.  Plan is rate control.  Continue metoprolol and digoxin.  She has not been anticoagulated due to lung disease and risk of recurrent hemoptysis. Continue ASA.  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  4 postoperative volume excess-continue diuresis; follow Na and renal function.  5 Elevated LFTs-slightly improved this morning.  Amiodarone and Lipitor on hold.  6 hypokalemia-supplement  For questions or updates, please contact [place] Please consult [email or web address] for contact info under        Signed, Kirk Ruths, MD  10/11/2018, 7:43 AM

## 2018-10-11 NOTE — Progress Notes (Signed)
62 year old woman with interstitial lung disease  post CABG. Postop course complicated by severe hypoxia requiring high flow nasal cannula and runs of atrial fibrillation  Pathology from upper lobe biopsy - favor fibrotic NSIP clinically but  second opinion from [place] was read as UIP   She is out of bed to chair today and appears slightly improved although remains on high flow nasal cannula, use BiPAP for 3 hours overnight. On exam-bilateral scattered crackles, mild accessory muscle use, S1-S2 regular, soft and nontender abdomen, 1+ edema.  Chest x-ray personally reviewed which shows persistent bilateral interstitial infiltrates.  Labs show mild hyponatremia persists, hypokalemia to 3.1, elevated LFTs improved, increasing leukocytosis likely related to steroids.  Impression/plan Acute hypoxic respiratory failure/ILD -continue high-dose steroids, Solu-Medrol 125 every 8-has not had dramatic response so far. Try to titrate down on high flow nasal cannula, except saturation 92% above. Okay to use BiPAP during sleep as she tolerates  Serratia H CAP-continue ceftriaxone 14 days total, leukocytosis due to steroids  Postop atrial fibrillation -rate control as this strategy here, avoid amiodarone and statin due to elevated LFTs  I am a little encouraged today and remain hopeful that her hypoxia will improve eventually, although she is behaving more and more like a UIP flare  Kara Mead MD. FCCP. [facility] Pager [phone] If no response call [phone]   10/11/2018

## 2018-10-12 ENCOUNTER — Inpatient Hospital Stay (HOSPITAL_COMMUNITY): Payer: 59

## 2018-10-12 LAB — POCT I-STAT, CHEM 8
BUN: 22 mg/dL (ref 8–23)
BUN: 24 mg/dL — ABNORMAL HIGH (ref 8–23)
Calcium, Ion: 1.12 mmol/L — ABNORMAL LOW (ref 1.15–1.40)
Calcium, Ion: 1.17 mmol/L (ref 1.15–1.40)
Chloride: 92 mmol/L — ABNORMAL LOW (ref 98–111)
Chloride: 92 mmol/L — ABNORMAL LOW (ref 98–111)
Creatinine, Ser: 0.6 mg/dL (ref 0.44–1.00)
Creatinine, Ser: 0.7 mg/dL (ref 0.44–1.00)
Glucose, Bld: 180 mg/dL — ABNORMAL HIGH (ref 70–99)
Glucose, Bld: 180 mg/dL — ABNORMAL HIGH (ref 70–99)
HCT: 29 % — ABNORMAL LOW (ref 36.0–46.0)
HCT: 31 % — ABNORMAL LOW (ref 36.0–46.0)
Hemoglobin: 10.5 g/dL — ABNORMAL LOW (ref 12.0–15.0)
Hemoglobin: 9.9 g/dL — ABNORMAL LOW (ref 12.0–15.0)
Potassium: 4.9 mmol/L (ref 3.5–5.1)
Potassium: 5.9 mmol/L — ABNORMAL HIGH (ref 3.5–5.1)
Sodium: 132 mmol/L — ABNORMAL LOW (ref 135–145)
Sodium: 132 mmol/L — ABNORMAL LOW (ref 135–145)
TCO2: 30 mmol/L (ref 22–32)
TCO2: 33 mmol/L — ABNORMAL HIGH (ref 22–32)

## 2018-10-12 LAB — COMPREHENSIVE METABOLIC PANEL
ALT: 432 U/L — ABNORMAL HIGH (ref 0–44)
AST: 219 U/L — ABNORMAL HIGH (ref 15–41)
Albumin: 2.5 g/dL — ABNORMAL LOW (ref 3.5–5.0)
Alkaline Phosphatase: 207 U/L — ABNORMAL HIGH (ref 38–126)
Anion gap: 11 (ref 5–15)
BUN: 18 mg/dL (ref 8–23)
CO2: 32 mmol/L (ref 22–32)
Calcium: 7.8 mg/dL — ABNORMAL LOW (ref 8.9–10.3)
Chloride: 92 mmol/L — ABNORMAL LOW (ref 98–111)
Creatinine, Ser: 0.8 mg/dL (ref 0.44–1.00)
GFR calc Af Amer: >60 mL/min (ref >60)
GFR calc non Af Amer: >60 mL/min (ref >60)
Glucose, Bld: 127 mg/dL — ABNORMAL HIGH (ref 70–99)
Potassium: 2.8 mmol/L — ABNORMAL LOW (ref 3.5–5.1)
Sodium: 135 mmol/L (ref 135–145)
Total Bilirubin: 0.4 mg/dL (ref 0.3–1.2)
Total Protein: 5.6 g/dL — ABNORMAL LOW (ref 6.5–8.1)

## 2018-10-12 LAB — GLUCOSE, CAPILLARY
Glucose-Capillary: 121 mg/dL — ABNORMAL HIGH (ref 70–99)
Glucose-Capillary: 167 mg/dL — ABNORMAL HIGH (ref 70–99)
Glucose-Capillary: 200 mg/dL — ABNORMAL HIGH (ref 70–99)
Glucose-Capillary: 124 mg/dL — ABNORMAL HIGH (ref 70–99)

## 2018-10-12 LAB — CBC
HCT: 28.9 % — ABNORMAL LOW (ref 36.0–46.0)
Hemoglobin: 9.7 g/dL — ABNORMAL LOW (ref 12.0–15.0)
MCH: 32.1 pg (ref 26.0–34.0)
MCHC: 33.6 g/dL (ref 30.0–36.0)
MCV: 95.7 fL (ref 80.0–100.0)
Platelets: 485 10*3/uL — ABNORMAL HIGH (ref 150–400)
RBC: 3.02 MIL/uL — ABNORMAL LOW (ref 3.87–5.11)
RDW: 13.9 % (ref 11.5–15.5)
WBC: 22.5 10*3/uL — ABNORMAL HIGH (ref 4.0–10.5)
nRBC: 0.1 % (ref 0.0–0.2)

## 2018-10-12 MED ORDER — METHYLPREDNISOLONE SODIUM SUCC 125 MG IJ SOLR
80.0000 mg | Freq: Three times a day (TID) | INTRAMUSCULAR | Status: DC
  Administered 2018-10-12 – 2018-10-15 (×9): 80 mg via INTRAVENOUS
  Filled 2018-10-12 (×9): qty 2

## 2018-10-12 MED ORDER — SODIUM CHLORIDE 0.9 % IV SOLN
2.0000 g | INTRAVENOUS | Status: AC
  Administered 2018-10-12 – 2018-10-16 (×5): 2 g via INTRAVENOUS
  Filled 2018-10-12 (×5): qty 20

## 2018-10-12 MED ORDER — POTASSIUM CHLORIDE 10 MEQ/50ML IV SOLN
10.0000 meq | INTRAVENOUS | Status: AC
  Administered 2018-10-12 (×3): 10 meq via INTRAVENOUS
  Filled 2018-10-12 (×3): qty 50

## 2018-10-12 MED ORDER — FUROSEMIDE 10 MG/ML IJ SOLN
40.0000 mg | Freq: Every day | INTRAMUSCULAR | Status: DC
  Administered 2018-10-13 – 2018-10-16 (×4): 40 mg via INTRAVENOUS
  Filled 2018-10-12 (×5): qty 4

## 2018-10-12 NOTE — Progress Notes (Signed)
       [address]       [place], [address]             [phone]      Up on a walk at present. Requiring significant assistance  BP 129/85   Pulse 75   Temp 98.3 F (36.8 C) (Oral)   Resp 17   Ht 5\' 6"  (1.676 m)   Wt 61.5 kg   SpO2 100%   BMI 21.88 kg/m  8L HFNC 97% sat  Intake/Output Summary (Last 24 hours) at 10/12/2018 1804 Last data filed at 10/12/2018 1526 Gross per 24 hour  Intake 1001.93 ml  Output 1250 ml  Net -248.07 ml   K= 4.9, creatinine 0.7  Resume diuresis in AM  Levon Boettcher C. Roxan Hockey, MD [facility] [phone]

## 2018-10-12 NOTE — Progress Notes (Signed)
12 Days Post-Op Procedure(s) (LRB): CORONARY ARTERY BYPASS GRAFTING (CABG) X THREE, LIMA TO LAD, SVG TO DIAGONAL, SVG TO PDA  USING LEFT INTERNAL MAMMERY ARTERY AND RIGHT AND LEFT GREATER SAPHENOUS VEIN (N/A) TRANSESOPHAGEAL ECHOCARDIOGRAM (TEE) (N/A) LEFT UPPER LOBE WEDGE RESECTION (Left) Subjective: Up in chair, short of breath with minimal activity  Objective: Vital signs in last 24 hours: Temp:  [97.6 F (36.4 C)-98.5 F (36.9 C)] 97.6 F (36.4 C) (11/09 0804) Pulse Rate:  [71-85] 80 (11/09 0600) Cardiac Rhythm: Normal sinus rhythm (11/09 0400) Resp:  [16-28] 19 (11/09 0600) BP: (112-132)/(59-80) 126/67 (11/09 0600) SpO2:  [84 %-100 %] 94 % (11/09 0854) Weight:  [61.5 kg] 61.5 kg (11/09 0600)  Hemodynamic parameters for last 24 hours:    Intake/Output from previous day: 11/08 0701 - 11/09 0700 In: 1302.2 [P.O.:1080; IV Piggyback:222.2] Out: 2050 [Urine:2050] Intake/Output this shift: Total I/O In: -  Out: 200 [Urine:200]  General appearance: alert, cooperative and no distress Neurologic: intact Heart: regular rate and rhythm Lungs: diminished breath sounds bilaterally, rales bibasilar and no wheezing Wound: clean and dry  Lab Results: Recent Labs    10/11/18 0436 10/12/18 0333  WBC 21.7* 22.5*  HGB 9.1* 9.7*  HCT 27.5* 28.9*  PLT 472* 485*   BMET:  Recent Labs    10/11/18 0436 10/12/18 0333  NA 133* 135  K 3.1* 2.8*  CL 91* 92*  CO2 33* 32  GLUCOSE 137* 127*  BUN 20 18  CREATININE 0.91 0.80  CALCIUM 8.3* 7.8*    PT/INR: No results for input(s): LABPROT, INR in the last 72 hours. ABG    Component Value Date/Time   PHART 7.349 (L) 09/30/2018 2016   HCO3 22.1 09/30/2018 2016   TCO2 23 10/01/2018 1649   ACIDBASEDEF 3.0 (H) 09/30/2018 2016   O2SAT 95.0 09/30/2018 2016   CBG (last 3)  Recent Labs    10/11/18 1615 10/11/18 2136 10/12/18 0624  GLUCAP 109* 149* 121*    Assessment/Plan: S/P Procedure(s) (LRB): CORONARY ARTERY BYPASS  GRAFTING (CABG) X THREE, LIMA TO LAD, SVG TO DIAGONAL, SVG TO PDA  USING LEFT INTERNAL MAMMERY ARTERY AND RIGHT AND LEFT GREATER SAPHENOUS VEIN (N/A) TRANSESOPHAGEAL ECHOCARDIOGRAM (TEE) (N/A) LEFT UPPER LOBE WEDGE RESECTION (Left) -RESP- respiratory status remains primary issue  Has made some progress over the past couple of days but still marginal  Continue pulmonary regimen CV- stable RENAL- creatinine normal. Hypokalemia worse despite supplementation   Hold lasix this AM. Supplement K ENDO- CBG well controlled   LOS: 16 days    Sarah Escobar 10/12/2018

## 2018-10-12 NOTE — Progress Notes (Signed)
62 year old woman with interstitial lung disease  post CABG. Postop course complicated by severe hypoxia requiring high flow nasal cannula and runs of atrial fibrillation  Pathology from upper lobe biopsy - favor fibrotic NSIP clinically but  second opinion from [place] was read as UIP   She appears somewhat improved last 2 days, out of bed to chair, hypoxia is improved requiring 10 L high flow nasal cannula.  On exam-no accessory muscle use, S1-S2 regular, sinus on monitor, soft and nontender abdomen, 1+ edema, bilateral scattered crackles persist.  Chest x-ray personally reviewed, chronic interstitial infiltrates appear marginally improved.  Labs show hypokalemia worse, LFTs slightly improving, increasing leukocytosis related to steroids.  Impression/plan  Acute hypoxic respiratory failure/ILD -decrease Solu-Medrol to 80 every 8, she received high-dose steroids for 3 days and may have improved slightly as a result. We will except lower saturation of 90% in the bowel and hopefully titrate down on high flow nasal cannula.  Serratia H CAP-continue ceftriaxone for 14 days total  Postop atrial fibrillation-per cardiology, avoid amiodarone and statin due to elevated LFTs.  I am hopeful that she will continue to improve but may yet require oxygen on discharge, and point would be an oxygen requirements decreased to 5 L or lower.  Kara Mead MD. Shade Flood. [facility] Pager [phone] If no response call [phone]   10/12/2018

## 2018-10-12 NOTE — Brief Op Note (Signed)
Pt up to ambulation approx 1800, Pt on 10L [place] during ambulation trial, Upon exiting room pt had desaturated to 78% with minimal labored breathing but requested to go on, Pt ambulated another 50 ft and needed a break for increased SOB and "lightheadness" pt O2 sats remained between 78-80% on 10L even when pt had stopped for a break, Pt requested to go to room d/t fatigue after 10 minutes of ambulation trial, Pt ambulated a total of 190 ft and approx 15 min total of the ambulation trial, Pt O2 saturation stabilized upon sitting down in chair, Pt placed back on the 10L HFNC and had an increase of O2 saturation to 93%, Pt expressed fatigue upon sitting and requested a rest period,   Dr. Roxan Hockey visualized pt as she was ambulating down the hall

## 2018-10-13 ENCOUNTER — Other Ambulatory Visit: Payer: Self-pay | Admitting: Pulmonary Disease

## 2018-10-13 LAB — BASIC METABOLIC PANEL
ANION GAP: 6 (ref 5–15)
BUN: 21 mg/dL (ref 8–23)
CO2: 30 mmol/L (ref 22–32)
Calcium: 8.4 mg/dL — ABNORMAL LOW (ref 8.9–10.3)
Chloride: 96 mmol/L — ABNORMAL LOW (ref 98–111)
Creatinine, Ser: 0.82 mg/dL (ref 0.44–1.00)
Glucose, Bld: 127 mg/dL — ABNORMAL HIGH (ref 70–99)
POTASSIUM: 4.3 mmol/L (ref 3.5–5.1)
Sodium: 132 mmol/L — ABNORMAL LOW (ref 135–145)

## 2018-10-13 LAB — CBC
HEMATOCRIT: 31.1 % — AB (ref 36.0–46.0)
HEMOGLOBIN: 9.9 g/dL — AB (ref 12.0–15.0)
MCH: 30.8 pg (ref 26.0–34.0)
MCHC: 31.8 g/dL (ref 30.0–36.0)
MCV: 96.9 fL (ref 80.0–100.0)
NRBC: 0.1 % (ref 0.0–0.2)
Platelets: 520 10*3/uL — ABNORMAL HIGH (ref 150–400)
RBC: 3.21 MIL/uL — AB (ref 3.87–5.11)
RDW: 13.7 % (ref 11.5–15.5)
WBC: 23.2 10*3/uL — ABNORMAL HIGH (ref 4.0–10.5)

## 2018-10-13 LAB — GLUCOSE, CAPILLARY
Glucose-Capillary: 111 mg/dL — ABNORMAL HIGH (ref 70–99)
Glucose-Capillary: 134 mg/dL — ABNORMAL HIGH (ref 70–99)
Glucose-Capillary: 169 mg/dL — ABNORMAL HIGH (ref 70–99)
Glucose-Capillary: 119 mg/dL — ABNORMAL HIGH (ref 70–99)

## 2018-10-13 NOTE — Progress Notes (Signed)
13 Days Post-Op Procedure(s) (LRB): CORONARY ARTERY BYPASS GRAFTING (CABG) X THREE, LIMA TO LAD, SVG TO DIAGONAL, SVG TO PDA  USING LEFT INTERNAL MAMMERY ARTERY AND RIGHT AND LEFT GREATER SAPHENOUS VEIN (N/A) TRANSESOPHAGEAL ECHOCARDIOGRAM (TEE) (N/A) LEFT UPPER LOBE WEDGE RESECTION (Left) Subjective: No new complaints  Objective: Vital signs in last 24 hours: Temp:  [97.6 F (36.4 C)-98.3 F (36.8 C)] 97.9 F (36.6 C) (11/10 0750) Pulse Rate:  [66-86] 69 (11/10 0800) Cardiac Rhythm: Normal sinus rhythm (11/10 0000) Resp:  [15-39] 20 (11/10 0800) BP: (94-144)/(62-86) 129/75 (11/10 0800) SpO2:  [91 %-100 %] 95 % (11/10 0800) Weight:  [61.6 kg] 61.6 kg (11/10 0600)  Hemodynamic parameters for last 24 hours:    Intake/Output from previous day: 11/09 0701 - 11/10 0700 In: 680.1 [P.O.:480; IV Piggyback:200.1] Out: 650 [Urine:650] Intake/Output this shift: No intake/output data recorded.  General appearance: alert, cooperative and no distress Neurologic: intact Heart: regular rate and rhythm Lungs: diminished breath sounds bilaterally and rales bibasilar no edema  Lab Results: Recent Labs    10/12/18 0333  10/12/18 1720 10/13/18 0404  WBC 22.5*  --   --  23.2*  HGB 9.7*   < > 10.5* 9.9*  HCT 28.9*   < > 31.0* 31.1*  PLT 485*  --   --  520*   < > = values in this interval not displayed.   BMET:  Recent Labs    10/12/18 0333  10/12/18 1720 10/13/18 0404  NA 135   < > 132* 132*  K 2.8*   < > 4.9 4.3  CL 92*   < > 92* 96*  CO2 32  --   --  30  GLUCOSE 127*   < > 180* 127*  BUN 18   < > 24* 21  CREATININE 0.80   < > 0.70 0.82  CALCIUM 7.8*  --   --  8.4*   < > = values in this interval not displayed.    PT/INR: No results for input(s): LABPROT, INR in the last 72 hours. ABG    Component Value Date/Time   PHART 7.349 (L) 09/30/2018 2016   HCO3 22.1 09/30/2018 2016   TCO2 33 (H) 10/12/2018 1720   ACIDBASEDEF 3.0 (H) 09/30/2018 2016   O2SAT 95.0 09/30/2018  2016   CBG (last 3)  Recent Labs    10/12/18 1616 10/12/18 2128 10/13/18 0646  GLUCAP 200* 124* 111*    Assessment/Plan: S/P Procedure(s) (LRB): CORONARY ARTERY BYPASS GRAFTING (CABG) X THREE, LIMA TO LAD, SVG TO DIAGONAL, SVG TO PDA  USING LEFT INTERNAL MAMMERY ARTERY AND RIGHT AND LEFT GREATER SAPHENOUS VEIN (N/A) TRANSESOPHAGEAL ECHOCARDIOGRAM (TEE) (N/A) LEFT UPPER LOBE WEDGE RESECTION (Left) Respiratory status remains primary issue  CV- stable RESP- still on 8 L HFNC  Continue nebs, steroids, diuresis RENAL- creatinine normal, hypokalemia corrected  Resume IV lasix ENDO- CBG mildly elevated on steroids Leukocytosis- on IV solumedrol Remains on Ceftriaxone   LOS: 17 days    Melrose Nakayama 10/13/2018

## 2018-10-13 NOTE — Brief Op Note (Signed)
Pt ambulated into hallway with 6L  and Avasys, Pt maintained at 84% during the first half of walk 126ft and desaturated to 77% for the reminder of the walk, Pt reported less fatigue than ambulation yesterday, Pt ambulated total of 190 ft, Pt placed into chair upon returning to room with 5L HHFNC and monitored for improvement in O2, pt increased to 84% and maintained this for about 10 minutes, increased O2 to 6L and pt O2 increased to 92%, will continue to monitor

## 2018-10-13 NOTE — Progress Notes (Signed)
      [address]       [place] [address]             [phone]      Resting Did walk 190' earlier BP 130/66   Pulse 77   Temp 98.5 F (36.9 C) (Oral)   Resp 18   Ht 5\' 6"  (1.676 m)   Wt 61.6 kg   SpO2 93%   BMI 21.92 kg/m   Intake/Output Summary (Last 24 hours) at 10/13/2018 1816 Last data filed at 10/13/2018 1800 Gross per 24 hour  Intake 850.11 ml  Output 1250 ml  Net -399.89 ml   Down to 5L HFNC  Sarah Escobar C. Roxan Hockey, MD [facility] [phone]

## 2018-10-13 NOTE — Progress Notes (Signed)
62 year old woman with interstitial lung disease post CABG. Postop course complicated by severe hypoxia requiring high flow nasal cannula and runs of atrial fibrillation  Pathology from upper lobe biopsy-favor fibrotic NSIP clinically but second opinion from [place] was read as UIP   Continues to improve, out of bed, saturation 92% on 8 L high flow  Vitals:   10/13/18 0752 10/13/18 0800  BP: 126/76 129/75  Pulse: 67 69  Resp: 19 20  Temp:    SpO2: 97% 95%    On exam-no accessory muscle use, able to speak in full sentences, bilateral scattered crackles, S1-S2 regular, soft and nontender abdomen, 1+ edema  Labs show stable renal function, stable leukocytosis and mild anemia.  Impression/plan  Acute hypoxic respiratory failure/ILD-received high-dose steroids for 3 days, now decreased to Solu-Medrol 80 every 8, can continue to drop slowly as she improves. Accept saturation 90%  Resume Lasix 40 daily  Serratia HCAP-continue ceftriaxone for 14 days total  Postop atrial fibrillation-per cardiology, avoid amiodarone and starting it elevated LFTs, rate control best strategy here  Hopefully we can get her down to 5 L or lower prior to discharge  Kara Mead MD. FCCP. [facility] Pager [phone] If no response call [phone]   10/13/2018

## 2018-10-14 ENCOUNTER — Inpatient Hospital Stay (HOSPITAL_COMMUNITY): Payer: 59

## 2018-10-14 LAB — CBC
HCT: 31 % — ABNORMAL LOW (ref 36.0–46.0)
HEMOGLOBIN: 9.7 g/dL — AB (ref 12.0–15.0)
MCH: 30.5 pg (ref 26.0–34.0)
MCHC: 31.3 g/dL (ref 30.0–36.0)
MCV: 97.5 fL (ref 80.0–100.0)
NRBC: 0 % (ref 0.0–0.2)
Platelets: 508 10*3/uL — ABNORMAL HIGH (ref 150–400)
RBC: 3.18 MIL/uL — ABNORMAL LOW (ref 3.87–5.11)
RDW: 13.7 % (ref 11.5–15.5)
WBC: 22.7 10*3/uL — AB (ref 4.0–10.5)

## 2018-10-14 LAB — GLUCOSE, CAPILLARY
Glucose-Capillary: 109 mg/dL — ABNORMAL HIGH (ref 70–99)
Glucose-Capillary: 126 mg/dL — ABNORMAL HIGH (ref 70–99)
Glucose-Capillary: 168 mg/dL — ABNORMAL HIGH (ref 70–99)
Glucose-Capillary: 111 mg/dL — ABNORMAL HIGH (ref 70–99)

## 2018-10-14 LAB — BASIC METABOLIC PANEL
ANION GAP: 4 — AB (ref 5–15)
BUN: 19 mg/dL (ref 8–23)
CHLORIDE: 96 mmol/L — AB (ref 98–111)
CO2: 32 mmol/L (ref 22–32)
Calcium: 8.2 mg/dL — ABNORMAL LOW (ref 8.9–10.3)
Creatinine, Ser: 0.67 mg/dL (ref 0.44–1.00)
Glucose, Bld: 124 mg/dL — ABNORMAL HIGH (ref 70–99)
POTASSIUM: 3.9 mmol/L (ref 3.5–5.1)
SODIUM: 132 mmol/L — AB (ref 135–145)

## 2018-10-14 MED ORDER — BOOST / RESOURCE BREEZE PO LIQD CUSTOM
237.0000 mL | Freq: Three times a day (TID) | ORAL | Status: DC
  Administered 2018-10-14 – 2018-10-15 (×2): 1 via ORAL
  Administered 2018-10-17: 237 mL via ORAL

## 2018-10-14 NOTE — Progress Notes (Signed)
Patient ambulated this AM on 6L [place]. Patient maintained oxygen saturation at 88% with no decrease during duration of walk; walked 249ft. Patient now sitting in chair on 4L HFNC with oxygen saturation of 95%. Will continue to monitor.

## 2018-10-14 NOTE — Progress Notes (Signed)
PCCM Interval Progress Note  62 year old woman with interstitial lung disease post CABG. 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 Was down to 5L.  Just increased to 8 after getting up to use restroom and moving to chair for lunch.  Vitals:   10/14/18 0826 10/14/18 0900  BP:  118/67  Pulse: 71 74  Resp: 18 19  Temp:    SpO2: 96% 96%    General: Adult female, appears older than stated age, sitting on edge of bed, in NAD. Neuro: A&O x 3, no deficits. HEENT: [place]/AT. Sclerae anicteric, [place] in place. Cardiovascular: RRR, no M/R/G.  Lungs: Respirations even and unlabored.  Coarse crackles in bases. Abdomen: BS x 4, soft, NT/ND.  Musculoskeletal: No gross deformities, no edema.  Skin: Intact, warm, no rashes.   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 - Received high-dose steroids for 3 days, decreased to Solu-Medrol 80 q8hrs.  Can continue to drop slowly as she improves.  Maintain this dose today 11/11. - Continue supplemental O2 as needed to maintain SpO2 88 - 90%. - Continue Lasix 40 daily. - OOB as tolerated, mobilize as able.  Serratia HCAP. - Continue ceftriaxone for 14 days total.  Postop atrial fibrillation. - Per cardiology.  Avoid amiodarone, rate control best strategy here   Rest per primary team.   Montey Hora, PA - [facility] Pager: [phone]  or [phone] 10/14/2018, 12:18 PM

## 2018-10-14 NOTE — Progress Notes (Addendum)
Patient ID: Sarah Escobar, female   DOB: Sep 19, 1956, 62 y.o.   MRN: [id number] TCTS DAILY ICU PROGRESS NOTE                   [address]            [place] [address]          [phone]   14 Days Post-Op Procedure(s) (LRB): CORONARY ARTERY BYPASS GRAFTING (CABG) X THREE, LIMA TO LAD, SVG TO DIAGONAL, SVG TO PDA  USING LEFT INTERNAL MAMMERY ARTERY AND RIGHT AND LEFT GREATER SAPHENOUS VEIN (N/A) TRANSESOPHAGEAL ECHOCARDIOGRAM (TEE) (N/A) LEFT UPPER LOBE WEDGE RESECTION (Left)  Total Length of Stay:  LOS: 18 days   Subjective:  No new complaints.  Patient has been feeling better.  She ambulated this morning on 6L without dropping her saturations.  + BM  Objective: Vital signs in last 24 hours: Temp:  [97.6 F (36.4 C)-98.5 F (36.9 C)] 97.7 F (36.5 C) (11/11 0400) Pulse Rate:  [65-79] 66 (11/11 0600) Cardiac Rhythm: Normal sinus rhythm (11/10 2300) Resp:  [15-29] 29 (11/11 0600) BP: (109-136)/(61-81) 111/66 (11/11 0600) SpO2:  [88 %-100 %] 97 % (11/11 0600) FiO2 (%):  [0 %] 0 % (11/11 0110) Weight:  [61 kg] 61 kg (11/11 0500)  Filed Weights   10/12/18 0600 10/13/18 0600 10/14/18 0500  Weight: 61.5 kg 61.6 kg 61 kg    Weight change: -0.6 kg   Intake/Output from previous day: 11/10 0701 - 11/11 0700 In: 1330.1 [P.O.:1230; IV Piggyback:100.1] Out: 1350 [Urine:1350]  Intake/Output this shift: Total I/O In: 480 [P.O.:480] Out: 550 [Urine:550]  Current Meds: Scheduled Meds: . aspirin EC  325 mg Oral Daily   Or  . aspirin  324 mg Per Tube Daily  . buPROPion  300 mg Oral q morning - 10a  . Chlorhexidine Gluconate Cloth  6 each Topical Daily  . cholecalciferol  2,000 Units Oral Daily  . digoxin  0.125 mg Oral Daily  . enoxaparin (LOVENOX) injection  30 mg Subcutaneous QHS  . feeding supplement (ENSURE ENLIVE)  237 mL Oral TID BM  . finasteride  2.5 mg Oral Daily  . fluticasone  2 spray Each Nare BID  . fluticasone furoate-vilanterol  1 puff  Inhalation Daily  . furosemide  40 mg Intravenous Daily  . guaiFENesin  1,200 mg Oral BID  . insulin aspart  0-9 Units Subcutaneous TID WC  . ipratropium  0.5 mg Nebulization TID  . levalbuterol  1.25 mg Nebulization TID  . loratadine  10 mg Oral Daily  . mouth rinse  15 mL Mouth Rinse BID  . methylPREDNISolone (SOLU-MEDROL) injection  80 mg Intravenous Q8H  . metoprolol tartrate  25 mg Oral Q8H  . pantoprazole  40 mg Oral Daily  . potassium chloride  40 mEq Oral Daily  . sodium chloride  2 spray Each Nare BID   Continuous Infusions: . sodium chloride 1 mL/hr at 10/14/18 0419  . cefTRIAXone (ROCEPHIN)  IV Stopped (10/13/18 1428)   PRN Meds:.HYDROcodone-acetaminophen, ipratropium, levalbuterol, LORazepam, metoprolol tartrate, ondansetron (ZOFRAN) IV, sodium chloride flush, traMADol  General appearance: alert, cooperative and no distress Heart: regular rate and rhythm Lungs: diminished breath sounds bilaterally Abdomen: soft, non-tender; bowel sounds normal; no masses,  no organomegaly Extremities: edema trace, significantly improved, Wound: clean and dry ecchymosis BLE  Lab Results: CBC: Recent Labs    10/13/18 0404 10/14/18 0413  WBC 23.2* 22.7*  HGB 9.9* 9.7*  HCT 31.1* 31.0*  PLT 520* 508*   BMET:  Recent Labs    10/13/18 0404 10/14/18 0413  NA 132* 132*  K 4.3 3.9  CL 96* 96*  CO2 30 32  GLUCOSE 127* 124*  BUN 21 19  CREATININE 0.82 0.67  CALCIUM 8.4* 8.2*    CMET: Lab Results  Component Value Date   WBC 22.7 (H) 10/14/2018   HGB 9.7 (L) 10/14/2018   HCT 31.0 (L) 10/14/2018   PLT 508 (H) 10/14/2018   GLUCOSE 124 (H) 10/14/2018   CHOL 120 09/27/2018   TRIG 68 09/27/2018   HDL 36 (L) 09/27/2018   LDLCALC 70 09/27/2018   ALT 432 (H) 10/12/2018   AST 219 (H) 10/12/2018   NA 132 (L) 10/14/2018   K 3.9 10/14/2018   CL 96 (L) 10/14/2018   CREATININE 0.67 10/14/2018   BUN 19 10/14/2018   CO2 32 10/14/2018   TSH 1.676 09/30/2018   INR 1.43 09/30/2018    HGBA1C 5.3 09/30/2018      PT/INR: No results for input(s): LABPROT, INR in the last 72 hours. Radiology: No results found.   Assessment/Plan: S/P Procedure(s) (LRB): CORONARY ARTERY BYPASS GRAFTING (CABG) X THREE, LIMA TO LAD, SVG TO DIAGONAL, SVG TO PDA  USING LEFT INTERNAL MAMMERY ARTERY AND RIGHT AND LEFT GREATER SAPHENOUS VEIN (N/A) TRANSESOPHAGEAL ECHOCARDIOGRAM (TEE) (N/A) LEFT UPPER LOBE WEDGE RESECTION (Left)  1. CV-PAF, maintaining NSR on Lopressor 25 mg TID and Digoxin at 0.125 mg daily 2. Pulm- ILD, weaning oxygen as able, down to 5-6L today, aggressive pulm toilet per Pulmonary 3. Renal- creatinine WNL, weight is below baseline, K is WNL continue Lasix 4. ID- afebrile, + leukocytosis on Rocephin  5. DM- sugars controlled, continue current insulin regimen, will restart Boost per patient request and okay from our standpoint to have 6. Dispo- patient stable, maintaining NSR, pulmonary status slowly improving, care per pulmonary, continue current care   I have seen and examined Nelva Nay and agree with the above assessment  and plan.  [name] MD Beeper [phone] Office [phone] 10/14/2018 12:42 PM

## 2018-10-14 NOTE — Progress Notes (Signed)
Pt taken off HFNC and placed on 5L [place] at this time, tolerating well.

## 2018-10-14 NOTE — Progress Notes (Signed)
Patient ID: Sarah Escobar, female   DOB: 08-21-1956, 62 y.o.   MRN: [id number] EVENING ROUNDS NOTE :     [address]       [place] [address]             [phone]                 14 Days Post-Op Procedure(s) (LRB): CORONARY ARTERY BYPASS GRAFTING (CABG) X THREE, LIMA TO LAD, SVG TO DIAGONAL, SVG TO PDA  USING LEFT INTERNAL MAMMERY ARTERY AND RIGHT AND LEFT GREATER SAPHENOUS VEIN (N/A) TRANSESOPHAGEAL ECHOCARDIOGRAM (TEE) (N/A) LEFT UPPER LOBE WEDGE RESECTION (Left)  Total Length of Stay:  LOS: 18 days  BP 131/74   Pulse 73   Temp 98 F (36.7 C) (Oral)   Resp 19   Ht 5\' 6"  (1.676 m)   Wt 61 kg   SpO2 96%   BMI 21.71 kg/m   .Intake/Output      11/10 0701 - 11/11 0700 11/11 0701 - 11/12 0700   P.O. 1230 480   I.V. (mL/kg)  200.6 (3.3)   IV Piggyback 100.1 100   Total Intake(mL/kg) 1330.1 (21.8) 780.6 (12.8)   Urine (mL/kg/hr) 1350 (0.9) 600 (0.9)   Stool 0 0   Total Output 1350 600   Net -19.9 +180.6        Urine Occurrence  3 x   Stool Occurrence 2 x 1 x     . sodium chloride 1 mL/hr at 10/14/18 0419  . cefTRIAXone (ROCEPHIN)  IV 2 g (10/14/18 1313)     Lab Results  Component Value Date   WBC 22.7 (H) 10/14/2018   HGB 9.7 (L) 10/14/2018   HCT 31.0 (L) 10/14/2018   PLT 508 (H) 10/14/2018   GLUCOSE 124 (H) 10/14/2018   CHOL 120 09/27/2018   TRIG 68 09/27/2018   HDL 36 (L) 09/27/2018   LDLCALC 70 09/27/2018   ALT 432 (H) 10/12/2018   AST 219 (H) 10/12/2018   NA 132 (L) 10/14/2018   K 3.9 10/14/2018   CL 96 (L) 10/14/2018   CREATININE 0.67 10/14/2018   BUN 19 10/14/2018   CO2 32 10/14/2018   TSH 1.676 09/30/2018   INR 1.43 09/30/2018   HGBA1C 5.3 09/30/2018   Up to chair, still desats with activity   [name] MD  Beeper [phone] Office [phone] 10/14/2018 6:32 PM

## 2018-10-15 ENCOUNTER — Telehealth: Payer: Self-pay | Admitting: Internal Medicine

## 2018-10-15 LAB — BASIC METABOLIC PANEL
Anion gap: 6 (ref 5–15)
BUN: 19 mg/dL (ref 8–23)
CHLORIDE: 94 mmol/L — AB (ref 98–111)
CO2: 29 mmol/L (ref 22–32)
CREATININE: 0.69 mg/dL (ref 0.44–1.00)
Calcium: 8.2 mg/dL — ABNORMAL LOW (ref 8.9–10.3)
GFR calc Af Amer: >60 mL/min (ref >60)
GFR calc non Af Amer: >60 mL/min (ref >60)
Glucose, Bld: 119 mg/dL — ABNORMAL HIGH (ref 70–99)
Potassium: 4 mmol/L (ref 3.5–5.1)
Sodium: 129 mmol/L — ABNORMAL LOW (ref 135–145)

## 2018-10-15 LAB — CBC
HCT: 33.2 % — ABNORMAL LOW (ref 36.0–46.0)
Hemoglobin: 10.5 g/dL — ABNORMAL LOW (ref 12.0–15.0)
MCH: 30.5 pg (ref 26.0–34.0)
MCHC: 31.6 g/dL (ref 30.0–36.0)
MCV: 96.5 fL (ref 80.0–100.0)
PLATELETS: 510 10*3/uL — AB (ref 150–400)
RBC: 3.44 MIL/uL — ABNORMAL LOW (ref 3.87–5.11)
RDW: 13.7 % (ref 11.5–15.5)
WBC: 24.5 10*3/uL — ABNORMAL HIGH (ref 4.0–10.5)
nRBC: 0 % (ref 0.0–0.2)

## 2018-10-15 LAB — GLUCOSE, CAPILLARY
Glucose-Capillary: 124 mg/dL — ABNORMAL HIGH (ref 70–99)
Glucose-Capillary: 240 mg/dL — ABNORMAL HIGH (ref 70–99)
Glucose-Capillary: 120 mg/dL — ABNORMAL HIGH (ref 70–99)
Glucose-Capillary: 131 mg/dL — ABNORMAL HIGH (ref 70–99)

## 2018-10-15 LAB — DIGOXIN LEVEL: Digoxin Level: 0.4 ng/mL — ABNORMAL LOW (ref 0.8–2.0)

## 2018-10-15 MED ORDER — METHYLPREDNISOLONE SODIUM SUCC 125 MG IJ SOLR
60.0000 mg | Freq: Two times a day (BID) | INTRAMUSCULAR | Status: DC
  Administered 2018-10-15 – 2018-10-16 (×3): 60 mg via INTRAVENOUS
  Filled 2018-10-15 (×3): qty 2

## 2018-10-15 MED ORDER — SODIUM CHLORIDE 0.9 % IV SOLN: INTRAVENOUS | Status: DC | PRN

## 2018-10-15 NOTE — Progress Notes (Signed)
RT NOTE:  Pt request to come off BIPAP. RN placed on 4L [place].

## 2018-10-15 NOTE — Progress Notes (Signed)
PCCM Interval Progress Note  62 year old woman with interstitial lung disease post CABG. 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 Was down to 5L.  Just increased to 8 after getting up to use restroom and moving to chair for lunch.  Down 3 L nasal cannula continues to improve on a daily basis no problems were going out to stepdown unit or floors.  Vitals:   10/15/18 0800 10/15/18 0830  BP: 137/78   Pulse: 66   Resp: 20   Temp:  97.7 F (36.5 C)  SpO2: 96%     General: Appears brighter and in better spirits today HEENT: No JVD lymphadenopathy appreciated Neuro: Awake alert follows commands ambulating in halls CV: Heart sounds are regular PULM: even/non-labored, lungs bilaterally decreased air movement OZ:HYQM, non-tender, bsx4 active  Extremities: warm/dry, mild edema  Skin: no rashes or lesions .   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 - Received high-dose steroids for 3 days, decreased to Solu-Medrol 80 q8hrs.  Can continue to drop slowly as she improves.  Maintain this dose today 11/11. -Wean oxygen as long as exacerbated 88% -Diuresis as tolerated -Mobilize as tolerated -Okay with pulmonary if she goes to the floor or stepdown unit  Serratia HCAP. -Continue ceftriaxone for 14 days total currently on day 3  Postop atrial fibrillation. -Defer to cardiology.  Would avoid amiodarone in setting of chronic oxygen usage   Rest per primary team.   Richardson Landry Bryleigh Ottaway ACNP Maryanna Shape PCCM Pager [phone] till 1 pm If no answer page [phone] 10/15/2018, 8:47 AM

## 2018-10-15 NOTE — Telephone Encounter (Signed)
Thanks. Maybe they consider another agent instead of amio

## 2018-10-15 NOTE — Telephone Encounter (Signed)
Ardelle Anton said that [place] though bx was suggestive of UIP but being upper lobe only hard to make IPF dx with t his. Regardless, given UIP findings and progressiion and NEJM new data (IN[facility] study) where ofev works in progressive non-IPF conditions, I think is best you call this IPF and start her on Ofev  We discussed briefly at MDD 10/15/2018   Thanks    SIGNATURE    Dr. Brand Males, M.D., F.C.C.P,  Pulmonary and Critical Care Medicine Staff Physician, [place] Director - Interstitial Lung Disease  Program  Pulmonary [facility] [place], [place], [address]  Pager: [phone], If no answer or between  15:00h - 7:00h: call [phone] Telephone: [phone]  12:24 PM 10/15/2018

## 2018-10-15 NOTE — Progress Notes (Addendum)
      [address]       [place] [address]             [phone]      15 Days Post-Op Procedure(s) (LRB): CORONARY ARTERY BYPASS GRAFTING (CABG) X THREE, LIMA TO LAD, SVG TO DIAGONAL, SVG TO PDA  USING LEFT INTERNAL MAMMERY ARTERY AND RIGHT AND LEFT GREATER SAPHENOUS VEIN (N/A) TRANSESOPHAGEAL ECHOCARDIOGRAM (TEE) (N/A) LEFT UPPER LOBE WEDGE RESECTION (Left)   Subjective:  No new complaints.  Continues to feel better.  She ambulated this morning with some Desaturations.  Objective: Vital signs in last 24 hours: Temp:  [96.7 F (35.9 C)-98 F (36.7 C)] 98 F (36.7 C) (11/12 0416) Pulse Rate:  [65-78] 70 (11/12 0700) Cardiac Rhythm: Normal sinus rhythm (11/12 0000) Resp:  [14-24] 18 (11/12 0700) BP: (106-139)/(64-86) 126/74 (11/12 0700) SpO2:  [92 %-100 %] 98 % (11/12 0700) Weight:  [60.3 kg] 60.3 kg (11/12 0600)  Intake/Output from previous day: 11/11 0701 - 11/12 0700 In: 1518 [P.O.:1080; I.V.:338; IV Piggyback:100] Out: 950 [Urine:950]  General appearance: alert, cooperative and no distress Heart: regular rate and rhythm Lungs: diminished breath sounds bibasilar Abdomen: soft, non-tender; bowel sounds normal; no masses,  no organomegaly Extremities: edema trace,  Wound: clean and dry, seeping from incisions on lower legs all serous  Lab Results: Recent Labs    10/14/18 0413 10/15/18 0434  WBC 22.7* 24.5*  HGB 9.7* 10.5*  HCT 31.0* 33.2*  PLT 508* 510*   BMET:  Recent Labs    10/14/18 0413 10/15/18 0434  NA 132* 129*  K 3.9 4.0  CL 96* 94*  CO2 32 29  GLUCOSE 124* 119*  BUN 19 19  CREATININE 0.67 0.69  CALCIUM 8.2* 8.2*    PT/INR: No results for input(s): LABPROT, INR in the last 72 hours. ABG    Component Value Date/Time   PHART 7.349 (L) 09/30/2018 2016   HCO3 22.1 09/30/2018 2016   TCO2 33 (H) 10/12/2018 1720   ACIDBASEDEF 3.0 (H) 09/30/2018 2016   O2SAT 95.0 09/30/2018 2016   CBG (last 3)  Recent Labs     10/14/18 1714 10/14/18 2111 10/15/18 0616  GLUCAP 168* 111* 124*    Assessment/Plan: S/P Procedure(s) (LRB): CORONARY ARTERY BYPASS GRAFTING (CABG) X THREE, LIMA TO LAD, SVG TO DIAGONAL, SVG TO PDA  USING LEFT INTERNAL MAMMERY ARTERY AND RIGHT AND LEFT GREATER SAPHENOUS VEIN (N/A) TRANSESOPHAGEAL ECHOCARDIOGRAM (TEE) (N/A) LEFT UPPER LOBE WEDGE RESECTION (Left)  1. CV- NSR, continue Lopressor, Digoxin 2. Pulm- ILD, ambulated this morning on 6L desats into the mid 80s, currently on 3L via [place] with sats in the 90s, care per CCM 3. Renal-creatinine WNL, weight is below baseline, receiving lasix, potassium 4. ID- afebrile, Leukocytosis on steroids, continue Rocephin 5. DM- sugars controlled, continue current regimen 6. Dispo- patient making progress, pulmonary status is improving CCM is treating, continue Lasix, ABX, continue current care   LOS: 19 days    Erin Barrett 10/15/2018 I have seen and examined Sarah Escobar and agree with the above assessment  and plan.  [name] MD Beeper [phone] Office [phone] 10/16/2018 7:54 AM

## 2018-10-15 NOTE — Telephone Encounter (Signed)
Will do that after she discharges from the hospital.  Also had LFT abnormalities which we attributed to amiodarone

## 2018-10-15 NOTE — Progress Notes (Signed)
Patient ambulated on 6L [place] this AM. Patient maintained oxygen saturation between 84% and 85%, no further desaturation during walk. Patient walked 250ft. Now sitting in chair on 4L HFNC with oxygen saturation at 96%. Will continue to monitor.

## 2018-10-15 NOTE — Progress Notes (Signed)
TCTS BRIEF SICU PROGRESS NOTE  15 Days Post-Op  S/P Procedure(s) (LRB): CORONARY ARTERY BYPASS GRAFTING (CABG) X THREE, LIMA TO LAD, SVG TO DIAGONAL, SVG TO PDA  USING LEFT INTERNAL MAMMERY ARTERY AND RIGHT AND LEFT GREATER SAPHENOUS VEIN (N/A) TRANSESOPHAGEAL ECHOCARDIOGRAM (TEE) (N/A) LEFT UPPER LOBE WEDGE RESECTION (Left)   Stable day NSR w/ stable BP Breathing stable on 4 L/min HFNC  Plan: Continue current plan  Rexene Alberts, MD 10/15/2018 6:03 PM

## 2018-10-16 ENCOUNTER — Encounter (HOSPITAL_COMMUNITY): Payer: Self-pay | Admitting: *Deleted

## 2018-10-16 LAB — GLUCOSE, CAPILLARY
Glucose-Capillary: 96 mg/dL (ref 70–99)
Glucose-Capillary: 136 mg/dL — ABNORMAL HIGH (ref 70–99)
Glucose-Capillary: 158 mg/dL — ABNORMAL HIGH (ref 70–99)
Glucose-Capillary: 146 mg/dL — ABNORMAL HIGH (ref 70–99)

## 2018-10-16 LAB — SODIUM: Sodium: 129 mmol/L — ABNORMAL LOW (ref 135–145)

## 2018-10-16 MED ORDER — FOLIC ACID 1 MG PO TABS
1.0000 mg | ORAL_TABLET | Freq: Every day | ORAL | Status: DC
  Administered 2018-10-16 – 2018-10-23 (×8): 1 mg via ORAL
  Filled 2018-10-16 (×8): qty 1

## 2018-10-16 MED ORDER — ASPIRIN EC 81 MG PO TBEC
81.0000 mg | DELAYED_RELEASE_TABLET | Freq: Every day | ORAL | Status: DC
  Administered 2018-10-16: 81 mg via ORAL
  Filled 2018-10-16 (×2): qty 1

## 2018-10-16 MED ORDER — ASPIRIN 81 MG PO CHEW: 81.0000 mg | CHEWABLE_TABLET | Freq: Every day | ORAL | Status: DC

## 2018-10-16 MED ORDER — METHYLPREDNISOLONE SODIUM SUCC 40 MG IJ SOLR
40.0000 mg | Freq: Every day | INTRAMUSCULAR | Status: DC
  Administered 2018-10-17 – 2018-10-18 (×2): 40 mg via INTRAVENOUS
  Filled 2018-10-16 (×2): qty 1

## 2018-10-16 NOTE — Plan of Care (Signed)
  Problem: Education: Goal: Knowledge of General Education information will improve Description Including pain rating scale, medication(s)/side effects and non-pharmacologic comfort measures Outcome: Progressing   Problem: Health Behavior/Discharge Planning: Goal: Ability to manage health-related needs will improve Outcome: Progressing   Problem: Clinical Measurements: Goal: Ability to maintain clinical measurements within normal limits will improve Outcome: Progressing Goal: Will remain free from infection Outcome: Progressing Goal: Diagnostic test results will improve Outcome: Progressing Goal: Respiratory complications will improve Outcome: Progressing Goal: Cardiovascular complication will be avoided Outcome: Progressing   Problem: Activity: Goal: Risk for activity intolerance will decrease Outcome: Progressing   Problem: Nutrition: Goal: Adequate nutrition will be maintained Outcome: Progressing   Problem: Coping: Goal: Level of anxiety will decrease Outcome: Progressing   Problem: Elimination: Goal: Will not experience complications related to bowel motility Outcome: Progressing Goal: Will not experience complications related to urinary retention Outcome: Progressing   Problem: Pain Managment: Goal: General experience of comfort will improve Outcome: Progressing   Problem: Safety: Goal: Ability to remain free from injury will improve Outcome: Progressing   Problem: Skin Integrity: Goal: Risk for impaired skin integrity will decrease Outcome: Progressing   Problem: Education: Goal: Understanding of CV disease, CV risk reduction, and recovery process will improve Outcome: Progressing Goal: Individualized Educational Video(s) Outcome: Progressing   Problem: Activity: Goal: Ability to return to baseline activity level will improve Outcome: Progressing   Problem: Health Behavior/Discharge Planning: Goal: Ability to safely manage health-related needs  after discharge will improve Outcome: Progressing   Problem: Education: Goal: Will demonstrate proper wound care and an understanding of methods to prevent future damage Outcome: Progressing Goal: Knowledge of disease or condition will improve Outcome: Progressing Goal: Knowledge of the prescribed therapeutic regimen will improve Outcome: Progressing Goal: Individualized Educational Video(s) Outcome: Progressing   Problem: Activity: Goal: Risk for activity intolerance will decrease Outcome: Progressing   Problem: Cardiac: Goal: Will achieve and/or maintain hemodynamic stability Outcome: Progressing   Problem: Clinical Measurements: Goal: Postoperative complications will be avoided or minimized Outcome: Progressing   Problem: Respiratory: Goal: Respiratory status will improve Outcome: Progressing   Problem: Skin Integrity: Goal: Wound healing without signs and symptoms of infection Outcome: Progressing Goal: Risk for impaired skin integrity will decrease Outcome: Progressing

## 2018-10-16 NOTE — Progress Notes (Signed)
Patient ambulated on 6L [place] this AM. Patient maintained oxygen saturation between 82% and 84%, oxygen did drop at one point to 77% but patient recovered during the walk. Walked 264ft. Now sitting in chair on 4L HFNC with oxygen saturation at 94%. Will continue to monitor.

## 2018-10-16 NOTE — Plan of Care (Signed)
  Problem: Education: Goal: Knowledge of General Education information will improve Description Including pain rating scale, medication(s)/side effects and non-pharmacologic comfort measures Outcome: Progressing   Problem: Health Behavior/Discharge Planning: Goal: Ability to manage health-related needs will improve Outcome: Progressing   Problem: Clinical Measurements: Goal: Ability to maintain clinical measurements within normal limits will improve Outcome: Progressing Goal: Will remain free from infection Outcome: Progressing Goal: Respiratory complications will improve Outcome: Progressing Goal: Cardiovascular complication will be avoided Outcome: Progressing   Problem: Activity: Goal: Risk for activity intolerance will decrease Outcome: Progressing   Problem: Nutrition: Goal: Adequate nutrition will be maintained Outcome: Progressing   Problem: Elimination: Goal: Will not experience complications related to urinary retention Outcome: Progressing   Problem: Pain Managment: Goal: General experience of comfort will improve Outcome: Progressing   Problem: Safety: Goal: Ability to remain free from injury will improve Outcome: Progressing   Problem: Skin Integrity: Goal: Risk for impaired skin integrity will decrease Outcome: Progressing

## 2018-10-16 NOTE — Progress Notes (Signed)
      [address]       [place] [address]             [phone]      BP 101/71   Pulse 71   Temp 98.4 F (36.9 C) (Oral)   Resp 20   Ht 5\' 6"  (1.676 m)   Wt 60.2 kg   SpO2 99%   BMI 21.42 kg/m   Intake/Output Summary (Last 24 hours) at 10/16/2018 1719 Last data filed at 10/16/2018 1600 Gross per 24 hour  Intake 842.14 ml  Output 1200 ml  Net -357.86 ml   Stable day  On 3L [place]  Sava Proby C. Roxan Hockey, MD [facility] [phone]

## 2018-10-16 NOTE — Progress Notes (Signed)
PCCM Interval Progress Note  62 year old woman with interstitial lung disease post CABG. 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 Was down to 5L.  Just increased to 8 after getting up to use restroom and moving to chair for lunch.  Down 3 L nasal cannula continues to improve on a daily basis no problems were going out to stepdown unit or floors.  10/16/2018 continues to progress would be able to transfer to floor if okay with CVTS  Vitals:   10/16/18 0815 10/16/18 0818  BP:    Pulse: 69   Resp: (!) 21   Temp:  98.3 F (36.8 C)  SpO2: 92%     General: Well-nourished well developed female sitting in a chair in no acute distress HEENT: MM no JVD lymphadenopathy is appreciated Neuro: Awake alert following commands asked appropriate questions CV: Sounds are regular PULM: even/non-labored, lungs bilaterally diminished air movement bilaterally, sternal wound well approximated JO:ACZY, non-tender, bsx4 active  Extremities: warm/dry, negative edema  Skin: no rashes or lesions    Impression/plan  Acute hypoxic respiratory failure/ILD - Pathology from upper lobe biopsy-favor fibrotic NSIP clinically but 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 -Currently on 60 mg of Solu-Medrol every 12 hours, continue to wean -Her oxygen is now been decreased to 3 L nasal cannula for sats of 98% -Rhesus as tolerated -Positive pulmonary toilet -Okay to transfer to floor   Serratia HCAP. -Continue to ceftriaxone for 14 days currently on day 4  Postop atrial fibrillation. -Defer to cardiology -Currently on digoxin and Lopressor   Rest per primary team.   Richardson Landry Minor ACNP Maryanna Shape PCCM Pager [phone] till 1 pm If no  answer page [phone] 10/16/2018, 8:49 AM

## 2018-10-16 NOTE — Progress Notes (Signed)
Patient resting comfortably in chair on 4L nasal cannula.  Decreased to 3L due to sats of 98%.  Sats currently 93%.  No respiratory distress noted at this time.  Will continue to monitor.

## 2018-10-16 NOTE — Progress Notes (Signed)
Patient ID: Sarah Escobar, female   DOB: 01-23-1956, 62 y.o.   MRN: [id number] TCTS DAILY ICU PROGRESS NOTE                   [address]            [place] [address]          [phone]   16 Days Post-Op Procedure(s) (LRB): CORONARY ARTERY BYPASS GRAFTING (CABG) X THREE, LIMA TO LAD, SVG TO DIAGONAL, SVG TO PDA  USING LEFT INTERNAL MAMMERY ARTERY AND RIGHT AND LEFT GREATER SAPHENOUS VEIN (N/A) TRANSESOPHAGEAL ECHOCARDIOGRAM (TEE) (N/A) LEFT UPPER LOBE WEDGE RESECTION (Left)  Total Length of Stay:  LOS: 20 days   Subjective: Up to chair this morning  Objective: Vital signs in last 24 hours: Temp:  [97.7 F (36.5 C)-98.4 F (36.9 C)] 98.2 F (36.8 C) (11/13 0400) Pulse Rate:  [64-83] 66 (11/13 0700) Cardiac Rhythm: Normal sinus rhythm (11/13 0000) Resp:  [13-23] 21 (11/13 0700) BP: (111-137)/(66-81) 119/71 (11/13 0700) SpO2:  [90 %-99 %] 93 % (11/13 0700) FiO2 (%):  [50 %] 50 % (11/12 2029) Weight:  [60.2 kg] 60.2 kg (11/13 0600)  Filed Weights   10/14/18 0500 10/15/18 0600 10/16/18 0600  Weight: 61 kg 60.3 kg 60.2 kg    Weight change: -0.1 kg   Hemodynamic parameters for last 24 hours:    Intake/Output from previous day: 11/12 0701 - 11/13 0700 In: 321.3 [P.O.:240; I.V.:23.4; IV Piggyback:57.9] Out: 2350 [Urine:2350]  Intake/Output this shift: No intake/output data recorded.  Current Meds: Scheduled Meds: . aspirin EC  325 mg Oral Daily   Or  . aspirin  324 mg Per Tube Daily  . buPROPion  300 mg Oral q morning - 10a  . Chlorhexidine Gluconate Cloth  6 each Topical Daily  . cholecalciferol  2,000 Units Oral Daily  . digoxin  0.125 mg Oral Daily  . enoxaparin (LOVENOX) injection  30 mg Subcutaneous QHS  . feeding supplement  237 mL Oral TID WC  . finasteride  2.5 mg Oral Daily  . fluticasone  2 spray Each Nare BID  . fluticasone furoate-vilanterol  1 puff Inhalation Daily  . furosemide  40 mg Intravenous Daily  . guaiFENesin  1,200 mg  Oral BID  . insulin aspart  0-9 Units Subcutaneous TID WC  . ipratropium  0.5 mg Nebulization TID  . levalbuterol  1.25 mg Nebulization TID  . loratadine  10 mg Oral Daily  . mouth rinse  15 mL Mouth Rinse BID  . methylPREDNISolone (SOLU-MEDROL) injection  60 mg Intravenous Q12H  . metoprolol tartrate  25 mg Oral Q8H  . pantoprazole  40 mg Oral Daily  . potassium chloride  40 mEq Oral Daily  . sodium chloride  2 spray Each Nare BID   Continuous Infusions: . sodium chloride Stopped (10/15/18 1342)  . sodium chloride    . cefTRIAXone (ROCEPHIN)  IV Stopped (10/15/18 1900)   PRN Meds:.sodium chloride, HYDROcodone-acetaminophen, ipratropium, levalbuterol, LORazepam, metoprolol tartrate, ondansetron (ZOFRAN) IV, sodium chloride flush, traMADol  General appearance: alert and cooperative Neurologic: intact Heart: regular rate and rhythm, S1, S2 normal, no murmur, click, rub or gallop Lungs: diminished breath sounds bibasilar Abdomen: soft, non-tender; bowel sounds normal; no masses,  no organomegaly Extremities: extremities normal, atraumatic, no cyanosis or edema and Homans sign is negative, no sign of DVT  Lab Results: CBC: Recent Labs    10/14/18 0413 10/15/18 0434  WBC 22.7* 24.5*  HGB  9.7* 10.5*  HCT 31.0* 33.2*  PLT 508* 510*   BMET:  Recent Labs    10/14/18 0413 10/15/18 0434 10/16/18 0518  NA 132* 129* 129*  K 3.9 4.0  --   CL 96* 94*  --   CO2 32 29  --   GLUCOSE 124* 119*  --   BUN 19 19  --   CREATININE 0.67 0.69  --   CALCIUM 8.2* 8.2*  --     CMET: Lab Results  Component Value Date   WBC 24.5 (H) 10/15/2018   HGB 10.5 (L) 10/15/2018   HCT 33.2 (L) 10/15/2018   PLT 510 (H) 10/15/2018   GLUCOSE 119 (H) 10/15/2018   CHOL 120 09/27/2018   TRIG 68 09/27/2018   HDL 36 (L) 09/27/2018   LDLCALC 70 09/27/2018   ALT 432 (H) 10/12/2018   AST 219 (H) 10/12/2018   NA 129 (L) 10/16/2018   K 4.0 10/15/2018   CL 94 (L) 10/15/2018   CREATININE 0.69 10/15/2018    BUN 19 10/15/2018   CO2 29 10/15/2018   TSH 1.676 09/30/2018   INR 1.43 09/30/2018   HGBA1C 5.3 09/30/2018      PT/INR: No results for input(s): LABPROT, INR in the last 72 hours. Radiology: No results found.   Assessment/Plan: S/P Procedure(s) (LRB): CORONARY ARTERY BYPASS GRAFTING (CABG) X THREE, LIMA TO LAD, SVG TO DIAGONAL, SVG TO PDA  USING LEFT INTERNAL MAMMERY ARTERY AND RIGHT AND LEFT GREATER SAPHENOUS VEIN (N/A) TRANSESOPHAGEAL ECHOCARDIOGRAM (TEE) (N/A) LEFT UPPER LOBE WEDGE RESECTION (Left) Mobilize Diuresis Remains on Rocephin and prednisone per CCM,  LFTs still elevated off statin and amiodarone No fever or chills, elevated white count still attributed to steroids, Respiratory status is still tenuous with little activity desaturates, but is improving Holding sinus   Sarah Escobar 10/16/2018 7:55 AM

## 2018-10-17 ENCOUNTER — Encounter (HOSPITAL_COMMUNITY): Payer: Self-pay | Admitting: Cardiology

## 2018-10-17 ENCOUNTER — Inpatient Hospital Stay (HOSPITAL_COMMUNITY): Payer: 59

## 2018-10-17 DIAGNOSIS — E440 Moderate protein-calorie malnutrition: Secondary | ICD-10-CM

## 2018-10-17 LAB — CBC WITH DIFFERENTIAL/PLATELET
ABS IMMATURE GRANULOCYTES: 0.48 10*3/uL — AB (ref 0.00–0.07)
BASOS PCT: 0 %
Basophils Absolute: 0 10*3/uL (ref 0.0–0.1)
Eosinophils Absolute: 0 10*3/uL (ref 0.0–0.5)
Eosinophils Relative: 0 %
HCT: 34.3 % — ABNORMAL LOW (ref 36.0–46.0)
HEMOGLOBIN: 11.3 g/dL — AB (ref 12.0–15.0)
Immature Granulocytes: 2 %
LYMPHS PCT: 5 %
Lymphs Abs: 0.9 10*3/uL (ref 0.7–4.0)
MCH: 31.9 pg (ref 26.0–34.0)
MCHC: 32.9 g/dL (ref 30.0–36.0)
MCV: 96.9 fL (ref 80.0–100.0)
MONO ABS: 1 10*3/uL (ref 0.1–1.0)
MONOS PCT: 5 %
NEUTROS ABS: 17.2 10*3/uL — AB (ref 1.7–7.7)
Neutrophils Relative %: 88 %
PLATELETS: 445 10*3/uL — AB (ref 150–400)
RBC: 3.54 MIL/uL — AB (ref 3.87–5.11)
RDW: 13.8 % (ref 11.5–15.5)
WBC: 19.6 10*3/uL — AB (ref 4.0–10.5)
nRBC: 0 % (ref 0.0–0.2)

## 2018-10-17 LAB — GLUCOSE, CAPILLARY
Glucose-Capillary: 67 mg/dL — ABNORMAL LOW (ref 70–99)
Glucose-Capillary: 85 mg/dL (ref 70–99)
Glucose-Capillary: 167 mg/dL — ABNORMAL HIGH (ref 70–99)
Glucose-Capillary: 116 mg/dL — ABNORMAL HIGH (ref 70–99)
Glucose-Capillary: 111 mg/dL — ABNORMAL HIGH (ref 70–99)

## 2018-10-17 LAB — MAGNESIUM: MAGNESIUM: 2.2 mg/dL (ref 1.7–2.4)

## 2018-10-17 LAB — BASIC METABOLIC PANEL
ANION GAP: 6 (ref 5–15)
BUN: 20 mg/dL (ref 8–23)
CALCIUM: 8.2 mg/dL — AB (ref 8.9–10.3)
CO2: 29 mmol/L (ref 22–32)
Chloride: 96 mmol/L — ABNORMAL LOW (ref 98–111)
Creatinine, Ser: 0.66 mg/dL (ref 0.44–1.00)
GFR calc Af Amer: >60 mL/min (ref >60)
GFR calc non Af Amer: >60 mL/min (ref >60)
GLUCOSE: 96 mg/dL (ref 70–99)
POTASSIUM: 4.3 mmol/L (ref 3.5–5.1)
SODIUM: 131 mmol/L — AB (ref 135–145)

## 2018-10-17 LAB — PHOSPHORUS: Phosphorus: 2.7 mg/dL (ref 2.5–4.6)

## 2018-10-17 MED ORDER — SODIUM CHLORIDE 0.9% FLUSH
3.0000 mL | Freq: Two times a day (BID) | INTRAVENOUS | Status: DC
  Administered 2018-10-17 – 2018-10-19 (×5): 3 mL via INTRAVENOUS
  Administered 2018-10-20: 09:00:00 via INTRAVENOUS
  Administered 2018-10-21 – 2018-10-23 (×4): 3 mL via INTRAVENOUS

## 2018-10-17 MED ORDER — DOCUSATE SODIUM 100 MG PO CAPS
200.0000 mg | ORAL_CAPSULE | Freq: Every day | ORAL | Status: DC
  Administered 2018-10-17 – 2018-10-23 (×7): 200 mg via ORAL
  Filled 2018-10-17 (×7): qty 2

## 2018-10-17 MED ORDER — OXYCODONE HCL 5 MG PO TABS
5.0000 mg | ORAL_TABLET | ORAL | Status: DC | PRN
  Administered 2018-10-17 – 2018-10-18 (×2): 5 mg via ORAL
  Filled 2018-10-17 (×3): qty 1
  Filled 2018-10-17: qty 2

## 2018-10-17 MED ORDER — BISACODYL 10 MG RE SUPP: 10.0000 mg | Freq: Every day | RECTAL | Status: DC | PRN

## 2018-10-17 MED ORDER — MOVING RIGHT ALONG BOOK
Freq: Once | Status: AC
  Administered 2018-10-17: 09:00:00
  Filled 2018-10-17: qty 1

## 2018-10-17 MED ORDER — SODIUM CHLORIDE 0.9% FLUSH: 3.0000 mL | INTRAVENOUS | Status: DC | PRN

## 2018-10-17 MED ORDER — ONDANSETRON HCL 4 MG PO TABS: 4.0000 mg | ORAL_TABLET | Freq: Four times a day (QID) | ORAL | Status: DC | PRN

## 2018-10-17 MED ORDER — PANTOPRAZOLE SODIUM 40 MG PO TBEC
40.0000 mg | DELAYED_RELEASE_TABLET | Freq: Every day | ORAL | Status: DC
  Administered 2018-10-17 – 2018-10-23 (×7): 40 mg via ORAL
  Filled 2018-10-17 (×6): qty 1

## 2018-10-17 MED ORDER — POTASSIUM CHLORIDE CRYS ER 20 MEQ PO TBCR
20.0000 meq | EXTENDED_RELEASE_TABLET | Freq: Every day | ORAL | Status: DC
  Administered 2018-10-18 – 2018-10-23 (×6): 20 meq via ORAL
  Filled 2018-10-17 (×6): qty 1

## 2018-10-17 MED ORDER — SODIUM CHLORIDE 0.9 % IV SOLN: 250.0000 mL | INTRAVENOUS | Status: DC | PRN

## 2018-10-17 MED ORDER — BOOST / RESOURCE BREEZE PO LIQD CUSTOM
237.0000 mL | Freq: Two times a day (BID) | ORAL | Status: DC
  Administered 2018-10-21 – 2018-10-22 (×2): 1 via ORAL

## 2018-10-17 MED ORDER — FUROSEMIDE 40 MG PO TABS
40.0000 mg | ORAL_TABLET | Freq: Every day | ORAL | Status: DC
  Administered 2018-10-17 – 2018-10-22 (×6): 40 mg via ORAL
  Filled 2018-10-17 (×6): qty 1

## 2018-10-17 MED ORDER — ENSURE ENLIVE PO LIQD
237.0000 mL | Freq: Every day | ORAL | Status: DC
  Administered 2018-10-18 – 2018-10-23 (×4): 237 mL via ORAL

## 2018-10-17 MED ORDER — ALUM & MAG HYDROXIDE-SIMETH 200-200-20 MG/5ML PO SUSP: 15.0000 mL | ORAL | Status: DC | PRN

## 2018-10-17 MED ORDER — INSULIN ASPART 100 UNIT/ML ~~LOC~~ SOLN
0.0000 [IU] | Freq: Three times a day (TID) | SUBCUTANEOUS | Status: DC
  Administered 2018-10-17: 4 [IU] via SUBCUTANEOUS
  Administered 2018-10-18: 8 [IU] via SUBCUTANEOUS
  Administered 2018-10-19 – 2018-10-20 (×3): 2 [IU] via SUBCUTANEOUS

## 2018-10-17 MED ORDER — ONDANSETRON HCL 4 MG/2ML IJ SOLN: 4.0000 mg | Freq: Four times a day (QID) | INTRAMUSCULAR | Status: DC | PRN

## 2018-10-17 MED ORDER — TRAMADOL HCL 50 MG PO TABS
50.0000 mg | ORAL_TABLET | ORAL | Status: DC | PRN
  Administered 2018-10-17 – 2018-10-19 (×4): 100 mg via ORAL
  Administered 2018-10-19: 50 mg via ORAL
  Administered 2018-10-19: 100 mg via ORAL
  Administered 2018-10-20: 50 mg via ORAL
  Administered 2018-10-20 – 2018-10-21 (×4): 100 mg via ORAL
  Administered 2018-10-22: 50 mg via ORAL
  Administered 2018-10-22 – 2018-10-23 (×3): 100 mg via ORAL
  Filled 2018-10-17 (×2): qty 2
  Filled 2018-10-17: qty 1
  Filled 2018-10-17 (×8): qty 2
  Filled 2018-10-17: qty 1
  Filled 2018-10-17 (×3): qty 2

## 2018-10-17 MED ORDER — BISACODYL 5 MG PO TBEC
10.0000 mg | DELAYED_RELEASE_TABLET | Freq: Every day | ORAL | Status: DC | PRN
  Administered 2018-10-20: 10 mg via ORAL
  Filled 2018-10-17: qty 2

## 2018-10-17 NOTE — Progress Notes (Signed)
Hypoglycemic Event  CBG: 65  Treatment: 15 GM carbohydrate snack  Symptoms: None  Follow-up CBG: Time:0715 CBG Result: 85  Possible Reasons for Event: Unknown      [place] [facility] B Larie Mathes

## 2018-10-17 NOTE — Progress Notes (Signed)
Initial Nutrition Assessment  DOCUMENTATION CODES:   Non-severe (moderate) malnutrition in context of acute illness/injury(likely component of chronic malnutrition as well)  INTERVENTION:   Ensure Enlive po daily @ 1000, each supplement provides 350 kcal and 20 grams of protein  Boost Breeze po BID @ 1400, 2000; each supplement provides 250 kcal and 9 grams of protein  NUTRITION DIAGNOSIS:   Moderate Malnutrition related to acute illness(CAD with CABG this admission, acute on chronic lung disease) as evidenced by mild fat depletion, mild muscle depletion.  GOAL:   Patient will meet greater than or equal to 90% of their needs  MONITOR:   PO intake, Supplement acceptance, Labs, Weight trends  REASON FOR ASSESSMENT:   LOS    ASSESSMENT:   62 yo female admitted with hx of CAD  and chronic lung disease (ILD) on 10/24 and underwent CABG x 3, left lung biopsy and wedge resection on 10/28. PMH includes depression, HLD, HTN, ILD  10/24 Admitted 10/28 CABG x 3, left lung biopsy, wedge resection  Pt reports appetite is fair; reports eating around 50% of meal trays on average. Recorded po intake 50-100% of meals. Pt reports drinking some Boost Breeze. Pt indicates she likes Ensure but reports the "milky stuff" make her produce more phlegm as the day goes on and she prefers to only drink this in AM. Discussed ordering Ensure Enlive as AM supplement and for Boost Breeze as afternoon and evening snack. Pt in agreement with his.   Pt currently on HFNC, appears to be SOB on exertion. Pt denies that her respiratory status is affecting her po intake at this time. Pt indicates that she feels weak but has been ambulating around the unit and even walked around 4 times yesterday  Admission weight of 141 pounds. Up to 144-146 pounds post surgery. Pt reports her UBW is around 134 pounds, current wt 126 pounds. Net negative 8 L since admission.  Also noted +mild generalized edema.   Based on physical  exam and weight status, pt meeting clinical characteristics for acute malnutrition. However, likely component of chronic malnutrition as well  Labs: sodium 131, Creatinine wdl, CBGs 67-167 Meds: cholecalciferol, folic acid, lasix, solumedrol  NUTRITION - FOCUSED PHYSICAL EXAM:    Most Recent Value  Orbital Region  Mild depletion  Upper Arm Region  Mild depletion  Thoracic and Lumbar Region  Mild depletion  Buccal Region  Mild depletion  Temple Region  Mild depletion  Clavicle Bone Region  No depletion  Clavicle and Acromion Bone Region  No depletion  Scapular Bone Region  No depletion  Dorsal Hand  No depletion  Patellar Region  Mild depletion  Anterior Thigh Region  Mild depletion  Posterior Calf Region  Moderate depletion  Edema (RD Assessment)  Mild       Diet Order:   Diet Order            Diet heart healthy/carb modified Room service appropriate? Yes; Fluid consistency: Thin  Diet effective now              EDUCATION NEEDS:   Education needs have been addressed  Skin:  Skin Assessment: Reviewed RN Assessment  Last BM:  11/11  Height:   Ht Readings from Last 1 Encounters:  09/30/18 5\' 6"  (1.676 m)    Weight:   Wt Readings from Last 1 Encounters:  10/17/18 57.3 kg    Ideal Body Weight:  59.1 kg  BMI:  Body mass index is 20.39 kg/m.  Estimated Nutritional  Needs:   Kcal:  1700-1900 kcals   Protein:  85-95 g  Fluid:  >/= 1.7 L   Kerman Passey MS, RD, LDN, CNSC [phone] Pager  [phone] Weekend/On-Call Pager

## 2018-10-17 NOTE — Progress Notes (Signed)
CT surgery p.m. Rounds  Patient breathing comfortably in bed with adequate saturation on room air Waiting for transfer, orders in place

## 2018-10-17 NOTE — Progress Notes (Signed)
Attempted to wean patient from oxygen via nasal cannula. Pt desated to 79% on room air post coughing episode. Pt was SOB and had dyspnea while resting. RN placed pt on [place]. RN will continue to monitor.

## 2018-10-17 NOTE — Progress Notes (Addendum)
TCTS DAILY ICU PROGRESS NOTE                   [address]            [place] [address]          [phone]   17 Days Post-Op Procedure(s) (LRB): CORONARY ARTERY BYPASS GRAFTING (CABG) X THREE, LIMA TO LAD, SVG TO DIAGONAL, SVG TO PDA  USING LEFT INTERNAL MAMMERY ARTERY AND RIGHT AND LEFT GREATER SAPHENOUS VEIN (N/A) TRANSESOPHAGEAL ECHOCARDIOGRAM (TEE) (N/A) LEFT UPPER LOBE WEDGE RESECTION (Left)  Total Length of Stay:  LOS: 21 days   Subjective:  No new complaints.  Continues to make good progress.  She was able tolerate some time on RA yesterday but continues to desaturate with activity.  + ambulation  + BM  Objective: Vital signs in last 24 hours: Temp:  [97.6 F (36.4 C)-98.4 F (36.9 C)] 97.8 F (36.6 C) (11/14 0400) Pulse Rate:  [60-76] 67 (11/14 0600) Cardiac Rhythm: Normal sinus rhythm (11/14 0400) Resp:  [13-24] 21 (11/14 0600) BP: (101-128)/(61-80) 121/68 (11/14 0600) SpO2:  [92 %-100 %] 93 % (11/14 0600) FiO2 (%):  [40 %] 40 % (11/13 2308) Weight:  [57.3 kg] 57.3 kg (11/14 0500)  Filed Weights   10/15/18 0600 10/16/18 0600 10/17/18 0500  Weight: 60.3 kg 60.2 kg 57.3 kg    Weight change: -2.9 kg   Intake/Output from previous day: 11/13 0701 - 11/14 0700 In: 1322.1 [P.O.:1080; IV Piggyback:242.1] Out: 1700 [Urine:1700]  Current Meds: Scheduled Meds: . aspirin EC  81 mg Oral Daily   Or  . aspirin  81 mg Per Tube Daily  . buPROPion  300 mg Oral q morning - 10a  . Chlorhexidine Gluconate Cloth  6 each Topical Daily  . cholecalciferol  2,000 Units Oral Daily  . digoxin  0.125 mg Oral Daily  . enoxaparin (LOVENOX) injection  30 mg Subcutaneous QHS  . feeding supplement  237 mL Oral TID WC  . finasteride  2.5 mg Oral Daily  . fluticasone  2 spray Each Nare BID  . fluticasone furoate-vilanterol  1 puff Inhalation Daily  . folic acid  1 mg Oral Daily  . furosemide  40 mg Intravenous Daily  . guaiFENesin  1,200 mg Oral BID  . insulin  aspart  0-9 Units Subcutaneous TID WC  . ipratropium  0.5 mg Nebulization TID  . levalbuterol  1.25 mg Nebulization TID  . loratadine  10 mg Oral Daily  . mouth rinse  15 mL Mouth Rinse BID  . methylPREDNISolone (SOLU-MEDROL) injection  40 mg Intravenous Daily  . metoprolol tartrate  25 mg Oral Q8H  . pantoprazole  40 mg Oral Daily  . potassium chloride  40 mEq Oral Daily  . sodium chloride  2 spray Each Nare BID   Continuous Infusions: . sodium chloride Stopped (10/15/18 1342)  . sodium chloride     PRN Meds:.sodium chloride, HYDROcodone-acetaminophen, ipratropium, levalbuterol, LORazepam, metoprolol tartrate, ondansetron (ZOFRAN) IV, sodium chloride flush, traMADol  General appearance: alert, cooperative and no distress Heart: regular rate and rhythm Lungs: diminished breath sounds bilaterally Abdomen: soft, non-tender; bowel sounds normal; no masses,  no organomegaly Extremities: edema trace Wound: clean and dry, some staples have been removed from [facility] sites,   Lab Results: CBC: Recent Labs    10/15/18 0434 10/17/18 0444  WBC 24.5* 19.6*  HGB 10.5* 11.3*  HCT 33.2* 34.3*  PLT 510* 445*   BMET:  Recent Labs  10/15/18 0434 10/16/18 0518 10/17/18 0444  NA 129* 129* 131*  K 4.0  --  4.3  CL 94*  --  96*  CO2 29  --  29  GLUCOSE 119*  --  96  BUN 19  --  20  CREATININE 0.69  --  0.66  CALCIUM 8.2*  --  8.2*    CMET: Lab Results  Component Value Date   WBC 19.6 (H) 10/17/2018   HGB 11.3 (L) 10/17/2018   HCT 34.3 (L) 10/17/2018   PLT 445 (H) 10/17/2018   GLUCOSE 96 10/17/2018   CHOL 120 09/27/2018   TRIG 68 09/27/2018   HDL 36 (L) 09/27/2018   LDLCALC 70 09/27/2018   ALT 432 (H) 10/12/2018   AST 219 (H) 10/12/2018   NA 131 (L) 10/17/2018   K 4.3 10/17/2018   CL 96 (L) 10/17/2018   CREATININE 0.66 10/17/2018   BUN 20 10/17/2018   CO2 29 10/17/2018   TSH 1.676 09/30/2018   INR 1.43 09/30/2018   HGBA1C 5.3 09/30/2018      PT/INR: No results for  input(s): LABPROT, INR in the last 72 hours. Radiology: No results found.   Assessment/Plan: S/P Procedure(s) (LRB): CORONARY ARTERY BYPASS GRAFTING (CABG) X THREE, LIMA TO LAD, SVG TO DIAGONAL, SVG TO PDA  USING LEFT INTERNAL MAMMERY ARTERY AND RIGHT AND LEFT GREATER SAPHENOUS VEIN (N/A) TRANSESOPHAGEAL ECHOCARDIOGRAM (TEE) (N/A) LEFT UPPER LOBE WEDGE RESECTION (Left)  1. CV- NSR, on Lopressor, Digoxin 2. Pulm- ILD, weaning oxygen as tolerated, will require home use with activity and rest, continue pulm toilet/care per CCM, steroids down to 40 mg daily 3. Renal- creatinine WNL, weight is below baseline, Lasix down to 40 mg IV daily, can hopefully transition to oral regimen soon 4. ID- afebrile, ABX course has been completed, + leukocytosis due to steroids 5. Dispo- patient stable, maintaining NSR, respiratory status continues to improve, if remains stable and continues to make progress may be able to discharge over the weekend     Ellwood Handler 10/17/2018 7:31 AM   Hold sinus  o2 demans lower  Convert to po lasix  To step down  I have seen and examined Nelva Nay and agree with the above assessment  and plan.  [name] MD Beeper [phone] Office [phone] 10/17/2018 8:36 AM

## 2018-10-17 NOTE — Progress Notes (Signed)
NAME:  Sarah Escobar, MRN:  [id number], DOB:  1956-11-03, LOS: 21 ADMISSION DATE:  09/26/2018,   Brief History   Admitted s/p CABG, history of ILD, post-op hypoxemia   Past Medical History  CAD, s/p CABG, ILD (?UIP), chronic HP?  Past Medical History:  Diagnosis Date  . Allergy   . Asthma    adult onset  . Depression   . Hyperlipidemia    LDL goal = < 100  . Hypertension   . Osteopenia    mild  . Personal history of adenomatous colonic polyps/FHx colon cancer sister and father    [name]  . Sciatica of left side    intermittent     Significant Hospital Events   CABG  Consults:  TCTS   Procedures:  POD 17, CABG   Significant Diagnostic Tests:   Micro Data:   Antimicrobials:  Complete ceftriaxone for serratia   Interim history/subjective:  Doing well overnight. Stable up walking on 3 L today. Was given BIPAP QHS   Objective   Blood pressure (!) 101/58, pulse 69, temperature 97.7 F (36.5 C), temperature source Oral, resp. rate 14, height 5\' 6"  (1.676 m), weight 57.3 kg, SpO2 90 %.    FiO2 (%):  [40 %] 40 %   Intake/Output Summary (Last 24 hours) at 10/17/2018 1048 Last data filed at 10/17/2018 0800 Gross per 24 hour  Intake 1280 ml  Output 1500 ml  Net -220 ml   Filed Weights   10/15/18 0600 10/16/18 0600 10/17/18 0500  Weight: 60.3 kg 60.2 kg 57.3 kg    Examination: General: older fm, comfortable resting in chair  HENT: NCAT, sclera clear, pupils reactive  Lungs: basilar crackles, no wheeze  Cardiovascular: RRR< s1 s2  Abdomen: soft, nt, nd  Extremities: vein graft in left, staples present  Neuro: following commands,  No deficit   Resolved Hospital Problem list     Assessment & Plan:   Acute hypoxemic respiratory failure secondary to exacerbation of her chronic diffuse parenchymal lung disease, pathology consistent with chronic interstitial lung disease, NSIP versus UIP pathology.  No clear etiology at this time.  Pathology does not  seem to be consistent with chronic HP however there is evidence of air trapping and upper lobe predominance on HRCT. - wean FiO2  - prn A&A nebs TID  - stable for transfer from floor - Accept sats >88%  Serratia HCAP - off abx Postop atrial fibrillation - per TCTS, resolved  Leukocytosis - from steroids  Hyponatremia - stable   Best practice:  Diet: regular  Pain/Anxiety/Delirium protocol (if indicated): n/a VAP protocol (if indicated): n/a DVT prophylaxis: lovenox GI prophylaxis: full diet  Glucose control: ssi  Mobility: up walking  Code Status: full  Family Communication: husband  Disposition:   Labs   CBC: Recent Labs  Lab 10/12/18 0333  10/12/18 1720 10/13/18 0404 10/14/18 0413 10/15/18 0434 10/17/18 0444  WBC 22.5*  --   --  23.2* 22.7* 24.5* 19.6*  NEUTROABS  --   --   --   --   --   --  17.2*  HGB 9.7*   < > 10.5* 9.9* 9.7* 10.5* 11.3*  HCT 28.9*   < > 31.0* 31.1* 31.0* 33.2* 34.3*  MCV 95.7  --   --  96.9 97.5 96.5 96.9  PLT 485*  --   --  520* 508* 510* 445*   < > = values in this interval not displayed.    Basic Metabolic Panel:  Recent Labs  Lab 10/12/18 0333  10/12/18 1720 10/13/18 0404 10/14/18 0413 10/15/18 0434 10/16/18 0518 10/17/18 0444  NA 135   < > 132* 132* 132* 129* 129* 131*  K 2.8*   < > 4.9 4.3 3.9 4.0  --  4.3  CL 92*   < > 92* 96* 96* 94*  --  96*  CO2 32  --   --  30 32 29  --  29  GLUCOSE 127*   < > 180* 127* 124* 119*  --  96  BUN 18   < > 24* 21 19 19   --  20  CREATININE 0.80   < > 0.70 0.82 0.67 0.69  --  0.66  CALCIUM 7.8*  --   --  8.4* 8.2* 8.2*  --  8.2*  MG  --   --   --   --   --   --   --  2.2  PHOS  --   --   --   --   --   --   --  2.7   < > = values in this interval not displayed.   GFR: Estimated Creatinine Clearance: 66 mL/min (by C-G formula based on SCr of 0.66 mg/dL). Recent Labs  Lab 10/13/18 0404 10/14/18 0413 10/15/18 0434 10/17/18 0444  WBC 23.2* 22.7* 24.5* 19.6*    Liver Function  Tests: Recent Labs  Lab 10/11/18 0436 10/12/18 0333  AST 347* 219*  ALT 464* 432*  ALKPHOS 257* 207*  BILITOT 0.7 0.4  PROT 5.9* 5.6*  ALBUMIN 2.4* 2.5*   No results for input(s): LIPASE, AMYLASE in the last 168 hours. No results for input(s): AMMONIA in the last 168 hours.  ABG    Component Value Date/Time   PHART 7.349 (L) 09/30/2018 2016   PCO2ART 40.1 09/30/2018 2016   PO2ART 79.0 (L) 09/30/2018 2016   HCO3 22.1 09/30/2018 2016   TCO2 33 (H) 10/12/2018 1720   ACIDBASEDEF 3.0 (H) 09/30/2018 2016   O2SAT 95.0 09/30/2018 2016     Coagulation Profile: No results for input(s): INR, PROTIME in the last 168 hours.  Cardiac Enzymes: No results for input(s): CKTOTAL, CKMB, CKMBINDEX, TROPONINI in the last 168 hours.  HbA1C: Hgb A1c MFr Bld  Date/Time Value Ref Range Status  09/30/2018 05:00 AM 5.3 4.8 - 5.6 % Final    Comment:    (NOTE) Pre diabetes:          5.7%-6.4% Diabetes:              >6.4% Glycemic control for   <7.0% adults with diabetes     CBG: Recent Labs  Lab 10/16/18 1146 10/16/18 1627 10/16/18 2133 10/17/18 0655 10/17/18 0716  GLUCAP 136* 158* 146* 67* 85    Garner Nash, DO [facility] 10/17/2018 10:49 AM  Personal pager: [phone] If unanswered, please page CCM On-call: [phone]

## 2018-10-18 LAB — COMPREHENSIVE METABOLIC PANEL
ALT: 120 U/L — ABNORMAL HIGH (ref 0–44)
AST: 32 U/L (ref 15–41)
Albumin: 2.3 g/dL — ABNORMAL LOW (ref 3.5–5.0)
Alkaline Phosphatase: 132 U/L — ABNORMAL HIGH (ref 38–126)
Anion gap: 6 (ref 5–15)
BUN: 19 mg/dL (ref 8–23)
CO2: 29 mmol/L (ref 22–32)
Calcium: 8 mg/dL — ABNORMAL LOW (ref 8.9–10.3)
Chloride: 93 mmol/L — ABNORMAL LOW (ref 98–111)
Creatinine, Ser: 0.81 mg/dL (ref 0.44–1.00)
GFR calc Af Amer: >60 mL/min (ref >60)
GFR calc non Af Amer: >60 mL/min (ref >60)
Glucose, Bld: 89 mg/dL (ref 70–99)
Potassium: 4.3 mmol/L (ref 3.5–5.1)
Sodium: 128 mmol/L — ABNORMAL LOW (ref 135–145)
Total Bilirubin: 0.9 mg/dL (ref 0.3–1.2)
Total Protein: 4.8 g/dL — ABNORMAL LOW (ref 6.5–8.1)

## 2018-10-18 LAB — GLUCOSE, CAPILLARY
Glucose-Capillary: 48 mg/dL — ABNORMAL LOW (ref 70–99)
Glucose-Capillary: 140 mg/dL — ABNORMAL HIGH (ref 70–99)
Glucose-Capillary: 187 mg/dL — ABNORMAL HIGH (ref 70–99)
Glucose-Capillary: 206 mg/dL — ABNORMAL HIGH (ref 70–99)
Glucose-Capillary: 81 mg/dL (ref 70–99)

## 2018-10-18 LAB — CBC
HCT: 34.2 % — ABNORMAL LOW (ref 36.0–46.0)
Hemoglobin: 11.2 g/dL — ABNORMAL LOW (ref 12.0–15.0)
MCH: 31.5 pg (ref 26.0–34.0)
MCHC: 32.7 g/dL (ref 30.0–36.0)
MCV: 96.1 fL (ref 80.0–100.0)
Platelets: 391 10*3/uL (ref 150–400)
RBC: 3.56 MIL/uL — ABNORMAL LOW (ref 3.87–5.11)
RDW: 13.9 % (ref 11.5–15.5)
WBC: 17.5 10*3/uL — ABNORMAL HIGH (ref 4.0–10.5)
nRBC: 0 % (ref 0.0–0.2)

## 2018-10-18 MED ORDER — LEVALBUTEROL HCL 1.25 MG/0.5ML IN NEBU
INHALATION_SOLUTION | RESPIRATORY_TRACT | Status: AC
  Administered 2018-10-18: 1.25 mg via RESPIRATORY_TRACT
  Filled 2018-10-18: qty 0.5

## 2018-10-18 MED ORDER — PREDNISONE 20 MG PO TABS: 20.0000 mg | ORAL_TABLET | Freq: Every day | ORAL | Status: DC

## 2018-10-18 MED ORDER — PREDNISONE 20 MG PO TABS
40.0000 mg | ORAL_TABLET | Freq: Every day | ORAL | Status: AC
  Administered 2018-10-19 – 2018-10-22 (×4): 40 mg via ORAL
  Filled 2018-10-18 (×4): qty 2

## 2018-10-18 MED ORDER — LEVALBUTEROL HCL 1.25 MG/0.5ML IN NEBU
1.2500 mg | INHALATION_SOLUTION | Freq: Four times a day (QID) | RESPIRATORY_TRACT | Status: DC | PRN
  Administered 2018-10-19: 1.25 mg via RESPIRATORY_TRACT
  Filled 2018-10-18: qty 0.5

## 2018-10-18 MED ORDER — PREDNISONE 20 MG PO TABS
30.0000 mg | ORAL_TABLET | Freq: Every day | ORAL | Status: DC
  Administered 2018-10-23: 30 mg via ORAL
  Filled 2018-10-18: qty 1

## 2018-10-18 MED ORDER — PREDNISONE 10 MG PO TABS: 10.0000 mg | ORAL_TABLET | Freq: Every day | ORAL | Status: DC

## 2018-10-18 NOTE — Progress Notes (Signed)
NAME:  Sarah Escobar, MRN:  [id number], DOB:  Mar 28, 1956, LOS: 81 ADMISSION DATE:  09/26/2018,   Brief History   Admitted s/p CABG, history of ILD, post-op hypoxemia   Past Medical History  CAD, s/p CABG, ILD (?UIP), chronic HP?  Past Medical History:  Diagnosis Date  . Allergy   . Asthma    adult onset  . Depression   . Hyperlipidemia    LDL goal = < 100  . Hypertension   . Osteopenia    mild  . Personal history of adenomatous colonic polyps/FHx colon cancer sister and father    [name]  . Sciatica of left side    intermittent     Significant Hospital Events   CABG  Consults:  TCTS   Procedures:  POD 17, CABG   Significant Diagnostic Tests:   Micro Data:   Antimicrobials:  Complete ceftriaxone for serratia   Interim history/subjective:  Doing well overnight. Stable up walking on 3 L today. Was given BIPAP QHS   Objective   Blood pressure (!) 94/55, pulse 75, temperature 98.1 F (36.7 C), temperature source Oral, resp. rate 16, height 5\' 6"  (1.676 m), weight 58.2 kg, SpO2 93 %.        Intake/Output Summary (Last 24 hours) at 10/18/2018 0949 Last data filed at 10/18/2018 5102 Gross per 24 hour  Intake 1123 ml  Output 2100 ml  Net -977 ml   Filed Weights   10/16/18 0600 10/17/18 0500 10/18/18 0500  Weight: 60.2 kg 57.3 kg 58.2 kg    Examination: General: Thin female no acute distress HEENT: No adenopathy or JVD Neuro: Intact CV: s1s2 rrr, no m/r/g PULM: even/non-labored, lungs bilaterally air movement HE:NIDP, non-tender, bsx4 active  Extremities: warm/dry, negative edema  Skin: no rashes or lesions   Resolved Hospital Problem list     Assessment & Plan:   Acute hypoxemic respiratory failure secondary to exacerbation of her chronic diffuse parenchymal lung disease, pathology consistent with chronic interstitial lung disease, NSIP versus UIP pathology.  No clear etiology at this time.  Pathology does not seem to be consistent with  chronic HP however there is evidence of air trapping and upper lobe predominance on HRCT. -Continue to wean FiO2 as tolerated -Nebulizers -Increase activity as tolerated -Okay to transfer to floor  Serratia HCAP - off abx Postop atrial fibrillation - per TCTS, resolved  Leukocytosis - from steroids  Hyponatremia - stable   Best practice:  Diet: regular  Pain/Anxiety/Delirium protocol (if indicated): n/a VAP protocol (if indicated): n/a DVT prophylaxis: lovenox GI prophylaxis: full diet  Glucose control: ssi  Mobility: up walking  Code Status: full  Family Communication: Patient updated Disposition: Scheduled for transfer to floor  Labs   CBC: Recent Labs  Lab 10/13/18 0404 10/14/18 0413 10/15/18 0434 10/17/18 0444 10/18/18 0336  WBC 23.2* 22.7* 24.5* 19.6* 17.5*  NEUTROABS  --   --   --  17.2*  --   HGB 9.9* 9.7* 10.5* 11.3* 11.2*  HCT 31.1* 31.0* 33.2* 34.3* 34.2*  MCV 96.9 97.5 96.5 96.9 96.1  PLT 520* 508* 510* 445* 824    Basic Metabolic Panel: Recent Labs  Lab 10/13/18 0404 10/14/18 0413 10/15/18 0434 10/16/18 0518 10/17/18 0444 10/18/18 0336  NA 132* 132* 129* 129* 131* 128*  K 4.3 3.9 4.0  --  4.3 4.3  CL 96* 96* 94*  --  96* 93*  CO2 30 32 29  --  29 29  GLUCOSE 127* 124* 119*  --  96 89  BUN 21 19 19   --  20 19  CREATININE 0.82 0.67 0.69  --  0.66 0.81  CALCIUM 8.4* 8.2* 8.2*  --  8.2* 8.0*  MG  --   --   --   --  2.2  --   PHOS  --   --   --   --  2.7  --    GFR: Estimated Creatinine Clearance: 66.2 mL/min (by C-G formula based on SCr of 0.81 mg/dL). Recent Labs  Lab 10/14/18 0413 10/15/18 0434 10/17/18 0444 10/18/18 0336  WBC 22.7* 24.5* 19.6* 17.5*    Liver Function Tests: Recent Labs  Lab 10/12/18 0333 10/18/18 0336  AST 219* 32  ALT 432* 120*  ALKPHOS 207* 132*  BILITOT 0.4 0.9  PROT 5.6* 4.8*  ALBUMIN 2.5* 2.3*   No results for input(s): LIPASE, AMYLASE in the last 168 hours. No results for input(s): AMMONIA in the  last 168 hours.  ABG    Component Value Date/Time   PHART 7.349 (L) 09/30/2018 2016   PCO2ART 40.1 09/30/2018 2016   PO2ART 79.0 (L) 09/30/2018 2016   HCO3 22.1 09/30/2018 2016   TCO2 33 (H) 10/12/2018 1720   ACIDBASEDEF 3.0 (H) 09/30/2018 2016   O2SAT 95.0 09/30/2018 2016     Coagulation Profile: No results for input(s): INR, PROTIME in the last 168 hours.  Cardiac Enzymes: No results for input(s): CKTOTAL, CKMB, CKMBINDEX, TROPONINI in the last 168 hours.  HbA1C: Hgb A1c MFr Bld  Date/Time Value Ref Range Status  09/30/2018 05:00 AM 5.3 4.8 - 5.6 % Final    Comment:    (NOTE) Pre diabetes:          5.7%-6.4% Diabetes:              >6.4% Glycemic control for   <7.0% adults with diabetes     CBG: Recent Labs  Lab 10/17/18 1150 10/17/18 1635 10/17/18 2156 10/18/18 0712 10/18/18 0814  GLUCAP 167* 116* 111* 48* 140*    Steve Keyan Folson ACNP [facility] Pager [phone] till 1 pm If no answer page [phone] 10/18/2018, 9:49 AM

## 2018-10-18 NOTE — Progress Notes (Signed)
CARDIAC REHAB PHASE I   PRE:  Rate/Rhythm: 81 SR  BP:  Supine:   Sitting: 112/67  Standing:    SaO2: 95% 3.5L  MODE:  Ambulation: 350 ft   POST:  Rate/Rhythm: 89 SR  BP:  Supine:   Sitting: 116/71  Standing:    SaO2: took 10 L to keep 88-89%   Back to 3.5 L in bed resting with sats at 93% 1415-1442 Pt motivated to walk. This was her third walk today. Pt walked 350 ft with rolling walker and asst x 2. One asst monitoring oxygen requirements. Started out at 3.5 L but had to increase to 6, 8 and then 10L to keep sats up. Stopped a couple of times to rest. Back to bed and able to put back to 3.5 L with sats at 93%.   Graylon Good, RN BSN  10/18/2018 2:36 PM

## 2018-10-18 NOTE — Progress Notes (Addendum)
[facility] DAILY ICU PROGRESS NOTE                   [address]            [place] [address]          [phone]   18 Days Post-Op Procedure(s) (LRB): CORONARY ARTERY BYPASS GRAFTING (CABG) X THREE, LIMA TO LAD, SVG TO DIAGONAL, SVG TO PDA  USING LEFT INTERNAL MAMMERY ARTERY AND RIGHT AND LEFT GREATER SAPHENOUS VEIN (N/A) TRANSESOPHAGEAL ECHOCARDIOGRAM (TEE) (N/A) LEFT UPPER LOBE WEDGE RESECTION (Left)  Total Length of Stay:  LOS: 22 days   Subjective:  No new complaints.  Patient continues to feel better.  She is ambulating without much difficulty.  Her saturations continue to drop into the 80s.  Objective: Vital signs in last 24 hours: Temp:  [97.1 F (36.2 C)-98.3 F (36.8 C)] 98.1 F (36.7 C) (11/15 0400) Pulse Rate:  [57-79] 72 (11/15 0705) Cardiac Rhythm: Normal sinus rhythm (11/15 0600) Resp:  [13-25] 20 (11/15 0705) BP: (87-115)/(50-69) 94/55 (11/15 0705) SpO2:  [79 %-100 %] 92 % (11/15 0705) Weight:  [58.2 kg] 58.2 kg (11/15 0500)  Filed Weights   10/16/18 0600 10/17/18 0500 10/18/18 0500  Weight: 60.2 kg 57.3 kg 58.2 kg    Weight change: 0.9 kg   Intake/Output from previous day: 11/14 0701 - 11/15 0700 In: 1243 [P.O.:1240; I.V.:3] Out: 2100 [Urine:2100]  Current Meds: Scheduled Meds: . buPROPion  300 mg Oral q morning - 10a  . Chlorhexidine Gluconate Cloth  6 each Topical Daily  . cholecalciferol  2,000 Units Oral Daily  . digoxin  0.125 mg Oral Daily  . docusate sodium  200 mg Oral Daily  . enoxaparin (LOVENOX) injection  30 mg Subcutaneous QHS  . feeding supplement  237 mL Oral BID BM  . feeding supplement (ENSURE ENLIVE)  237 mL Oral Daily  . finasteride  2.5 mg Oral Daily  . fluticasone  2 spray Each Nare BID  . fluticasone furoate-vilanterol  1 puff Inhalation Daily  . folic acid  1 mg Oral Daily  . furosemide  40 mg Oral Daily  . guaiFENesin  1,200 mg Oral BID  . insulin aspart  0-24 Units Subcutaneous TID AC & HS  .  ipratropium  0.5 mg Nebulization TID  . levalbuterol  1.25 mg Nebulization TID  . loratadine  10 mg Oral Daily  . mouth rinse  15 mL Mouth Rinse BID  . methylPREDNISolone (SOLU-MEDROL) injection  40 mg Intravenous Daily  . metoprolol tartrate  25 mg Oral Q8H  . pantoprazole  40 mg Oral QAC breakfast  . potassium chloride  20 mEq Oral Daily  . sodium chloride flush  3 mL Intravenous Q12H   Continuous Infusions: . sodium chloride    . sodium chloride     PRN Meds:.sodium chloride, sodium chloride, alum & mag hydroxide-simeth, bisacodyl **OR** bisacodyl, LORazepam, ondansetron **OR** ondansetron (ZOFRAN) IV, oxyCODONE, sodium chloride flush, sodium chloride flush, traMADol  General appearance: alert, cooperative and no distress Heart: regular rate and rhythm Lungs: clear to auscultation bilaterally Abdomen: soft, non-tender; bowel sounds normal; no masses,  no organomegaly Extremities: edema trace Wound: clean and dry, staples remain in place in lower leg incisions  Lab Results: CBC: Recent Labs    10/17/18 0444 10/18/18 0336  WBC 19.6* 17.5*  HGB 11.3* 11.2*  HCT 34.3* 34.2*  PLT 445* 391   BMET:  Recent Labs    10/17/18 0444 10/18/18 0336  NA 131* 128*  K 4.3 4.3  CL 96* 93*  CO2 29 29  GLUCOSE 96 89  BUN 20 19  CREATININE 0.66 0.81  CALCIUM 8.2* 8.0*    CMET: Lab Results  Component Value Date   WBC 17.5 (H) 10/18/2018   HGB 11.2 (L) 10/18/2018   HCT 34.2 (L) 10/18/2018   PLT 391 10/18/2018   GLUCOSE 89 10/18/2018   CHOL 120 09/27/2018   TRIG 68 09/27/2018   HDL 36 (L) 09/27/2018   LDLCALC 70 09/27/2018   ALT 120 (H) 10/18/2018   AST 32 10/18/2018   NA 128 (L) 10/18/2018   K 4.3 10/18/2018   CL 93 (L) 10/18/2018   CREATININE 0.81 10/18/2018   BUN 19 10/18/2018   CO2 29 10/18/2018   TSH 1.676 09/30/2018   INR 1.43 09/30/2018   HGBA1C 5.3 09/30/2018      PT/INR: No results for input(s): LABPROT, INR in the last 72 hours. Radiology: No results  found.   Assessment/Plan: S/P Procedure(s) (LRB): CORONARY ARTERY BYPASS GRAFTING (CABG) X THREE, LIMA TO LAD, SVG TO DIAGONAL, SVG TO PDA  USING LEFT INTERNAL MAMMERY ARTERY AND RIGHT AND LEFT GREATER SAPHENOUS VEIN (N/A) TRANSESOPHAGEAL ECHOCARDIOGRAM (TEE) (N/A) LEFT UPPER LOBE WEDGE RESECTION (Left)  1. CV- NSR, continue Lopressor, Digoxin 2. Pulm- ILD, weaning oxygen as tolerated, currently on 2L [place], continue to wean as tolerated, remains on steroid, taper per CCM 3. Renal- creatinine WNL, weight is relatively stable, will continue lasixm K is WNL 4. ID- remains afebrile, ABX have been stopped, leukocytosis with steroid use 5. DM- sugars well controlled 6. Dispo- patient stable, awaiting transfer to 4E, continue to wean oxygen, steroid taper per CCM, will get 2V CXR in AM     Erin Barrett 10/18/2018 7:50 AM   To 4e  Stepdown today  I have seen and examined Sarah Escobar and agree with the above assessment  and plan.  [name] MD Beeper [phone] Office [phone] 10/18/2018 1:57 PM

## 2018-10-19 ENCOUNTER — Inpatient Hospital Stay (HOSPITAL_COMMUNITY): Payer: 59

## 2018-10-19 LAB — GLUCOSE, CAPILLARY
Glucose-Capillary: 72 mg/dL (ref 70–99)
Glucose-Capillary: 140 mg/dL — ABNORMAL HIGH (ref 70–99)
Glucose-Capillary: 110 mg/dL — ABNORMAL HIGH (ref 70–99)

## 2018-10-19 NOTE — Progress Notes (Signed)
Pacing wires removed intact per order.

## 2018-10-19 NOTE — Progress Notes (Addendum)
Pt walked about 300 ft. in hall. Pt started out on 4 liters and had to be increased to 10 liter to keep sats maintained in the 90s . Pt stated she feels very tired when walking. Otherwise pt tolerated walk fair. Pt is back in bed with high flow nasal cannula at 4L due to feeling short of breathe. Will continue to monitor.

## 2018-10-19 NOTE — Progress Notes (Addendum)
CARDIAC REHAB PHASE I   PRE:  Rate/Rhythm: 69 SR  BP:  Supine:   Sitting: 88/60  Standing:    SaO2: 96 5L  MODE:  Ambulation: 570 ft   POST:  Rate/Rhythm: 80 SR  BP:  Supine:   Sitting: 103/62  Standing:    SaO2: 87 Non rebreather 25L 1120-1215 On arrival pt in recliner on O2 5L nasal cannula. She has walked twice today but wanted to walk again. Assisted X 1 used walker and O2 6L n/c to start walking. Her  oxygen saturations dropped rapidly to 83% and oxygen increased to 10 liters nasal cannula. I changed pt's to 100% non re breather mask 10 liters which had her saturations up to 94%. As she continued to walk her saturation dropped to 85%. We finished the walk on 25 liters at the end of the walk saturation was 87%. I had pt to take multiple standing rest stops. She is DOE. Pt back to recliner after walk,as she recovered from walking was able to leave pt on 4 liters nasal cannula with saturation 95%.Pt did ask for breathing treatment after walk, respiratory paged.  Rodney Langton RN 10/19/2018 12:10 PM

## 2018-10-19 NOTE — Progress Notes (Addendum)
      [address]       [place] [address]             [phone]      19 Days Post-Op Procedure(s) (LRB): CORONARY ARTERY BYPASS GRAFTING (CABG) X THREE, LIMA TO LAD, SVG TO DIAGONAL, SVG TO PDA  USING LEFT INTERNAL MAMMERY ARTERY AND RIGHT AND LEFT GREATER SAPHENOUS VEIN (N/A) TRANSESOPHAGEAL ECHOCARDIOGRAM (TEE) (N/A) LEFT UPPER LOBE WEDGE RESECTION (Left) Subjective: She still gets short of breath while walking. She is eating well. Normal bowels.   Objective: Vital signs in last 24 hours: Temp:  [97.5 F (36.4 C)-98.1 F (36.7 C)] 97.5 F (36.4 C) (11/16 0354) Pulse Rate:  [59-88] 60 (11/16 0459) Cardiac Rhythm: Normal sinus rhythm (11/16 0701) Resp:  [16-18] 18 (11/16 0354) BP: (95-112)/(52-69) 95/62 (11/16 0633) SpO2:  [91 %-100 %] 97 % (11/16 0855) Weight:  [57.6 kg] 57.6 kg (11/16 0459)     Intake/Output from previous day: 11/15 0701 - 11/16 0700 In: 1200 [P.O.:1200] Out: -  Intake/Output this shift: No intake/output data recorded.  General appearance: alert, cooperative and no distress Heart: regular rate and rhythm, S1, S2 normal, no murmur, click, rub or gallop Lungs: clear to auscultation bilaterally Abdomen: soft, non-tender; bowel sounds normal; no masses,  no organomegaly Extremities: extremities normal, atraumatic, no cyanosis or edema Wound: clean and dry  Lab Results: Recent Labs    10/17/18 0444 10/18/18 0336  WBC 19.6* 17.5*  HGB 11.3* 11.2*  HCT 34.3* 34.2*  PLT 445* 391   BMET:  Recent Labs    10/17/18 0444 10/18/18 0336  NA 131* 128*  K 4.3 4.3  CL 96* 93*  CO2 29 29  GLUCOSE 96 89  BUN 20 19  CREATININE 0.66 0.81  CALCIUM 8.2* 8.0*    PT/INR: No results for input(s): LABPROT, INR in the last 72 hours. ABG    Component Value Date/Time   PHART 7.349 (L) 09/30/2018 2016   HCO3 22.1 09/30/2018 2016   TCO2 33 (H) 10/12/2018 1720   ACIDBASEDEF 3.0 (H) 09/30/2018 2016   O2SAT 95.0 09/30/2018 2016   CBG  (last 3)  Recent Labs    10/18/18 1612 10/18/18 2121 10/19/18 0623  GLUCAP 206* 81 72    Assessment/Plan: S/P Procedure(s) (LRB): CORONARY ARTERY BYPASS GRAFTING (CABG) X THREE, LIMA TO LAD, SVG TO DIAGONAL, SVG TO PDA  USING LEFT INTERNAL MAMMERY ARTERY AND RIGHT AND LEFT GREATER SAPHENOUS VEIN (N/A) TRANSESOPHAGEAL ECHOCARDIOGRAM (TEE) (N/A) LEFT UPPER LOBE WEDGE RESECTION (Left)  1. CV- NSR, continue Lopressor, Digoxin, discontinue EPW 2. Pulm- chronic interstitial lung disease, weaning oxygen as tolerated, currently on 3L [place], remains on steroid, taper. CXR shows stable interstitial opacities.  3. Renal- creatinine 0.81, weight is trending down, will continue lasix. Electrolytes okay. 4. ID- remains afebrile, ABX have been stopped, leukocytosis with steroid use 5. DM- sugars well controlled  Plan:  Continue to optimize lung function. She is ambulating short distances but does still get short of breath. She is using her incentive spirometer. Continue steroid taper.    LOS: 23 days    Sarah Escobar 10/19/2018  Patient drops Sat with ambulation  Tapering steroids  Off antibiotics  I have seen and examined Sarah Escobar and agree with the above assessment  and plan.  [name] MD Beeper [phone] Office [phone] 10/19/2018 12:40 PM

## 2018-10-20 LAB — GLUCOSE, CAPILLARY
Glucose-Capillary: 80 mg/dL (ref 70–99)
Glucose-Capillary: 144 mg/dL — ABNORMAL HIGH (ref 70–99)
Glucose-Capillary: 156 mg/dL — ABNORMAL HIGH (ref 70–99)
Glucose-Capillary: 120 mg/dL — ABNORMAL HIGH (ref 70–99)

## 2018-10-20 MED ORDER — GUAIFENESIN ER 600 MG PO TB12
1200.0000 mg | ORAL_TABLET | Freq: Two times a day (BID) | ORAL | Status: DC
  Administered 2018-10-20 – 2018-10-23 (×7): 1200 mg via ORAL
  Filled 2018-10-20 (×7): qty 2

## 2018-10-20 NOTE — Progress Notes (Addendum)
      [address]       [place] [address]             [phone]      20 Days Post-Op Procedure(s) (LRB): CORONARY ARTERY BYPASS GRAFTING (CABG) X THREE, LIMA TO LAD, SVG TO DIAGONAL, SVG TO PDA  USING LEFT INTERNAL MAMMERY ARTERY AND RIGHT AND LEFT GREATER SAPHENOUS VEIN (N/A) TRANSESOPHAGEAL ECHOCARDIOGRAM (TEE) (N/A) LEFT UPPER LOBE WEDGE RESECTION (Left) Subjective: Remains short of breath while ambulating. Put her oxygen down to 3L since 100% on 4L  Objective: Vital signs in last 24 hours: Temp:  [97.7 F (36.5 C)-99.4 F (37.4 C)] 98.6 F (37 C) (11/17 0518) Pulse Rate:  [65-75] 66 (11/17 0640) Cardiac Rhythm: Normal sinus rhythm (11/17 0701) Resp:  [18-20] 18 (11/17 0518) BP: (88-106)/(57-68) 105/57 (11/17 0640) SpO2:  [92 %-100 %] 99 % (11/17 0518) Weight:  [57.7 kg] 57.7 kg (11/17 0518)    Intake/Output from previous day: 11/16 0701 - 11/17 0700 In: 840 [P.O.:840] Out: 300 [Urine:300] Intake/Output this shift: No intake/output data recorded.  General appearance: alert, cooperative and no distress Heart: regular rate and rhythm, S1, S2 normal, no murmur, click, rub or gallop Lungs: clear to auscultation bilaterally Abdomen: soft, non-tender; bowel sounds normal; no masses,  no organomegaly Extremities: extremities normal, atraumatic, no cyanosis or edema Wound: clean and dry  Lab Results: Recent Labs    10/18/18 0336  WBC 17.5*  HGB 11.2*  HCT 34.2*  PLT 391   BMET:  Recent Labs    10/18/18 0336  NA 128*  K 4.3  CL 93*  CO2 29  GLUCOSE 89  BUN 19  CREATININE 0.81  CALCIUM 8.0*    PT/INR: No results for input(s): LABPROT, INR in the last 72 hours. ABG    Component Value Date/Time   PHART 7.349 (L) 09/30/2018 2016   HCO3 22.1 09/30/2018 2016   TCO2 33 (H) 10/12/2018 1720   ACIDBASEDEF 3.0 (H) 09/30/2018 2016   O2SAT 95.0 09/30/2018 2016   CBG (last 3)  Recent Labs    10/19/18 1130 10/19/18 1611 10/20/18 0634    GLUCAP 140* 110* 80    Assessment/Plan: S/P Procedure(s) (LRB): CORONARY ARTERY BYPASS GRAFTING (CABG) X THREE, LIMA TO LAD, SVG TO DIAGONAL, SVG TO PDA  USING LEFT INTERNAL MAMMERY ARTERY AND RIGHT AND LEFT GREATER SAPHENOUS VEIN (N/A) TRANSESOPHAGEAL ECHOCARDIOGRAM (TEE) (N/A) LEFT UPPER LOBE WEDGE RESECTION (Left)   1. CV- NSR, continue Lopressor, Digoxin, wires removed 2. Pulm- chronic interstitial lung disease, weaning oxygen as tolerated, currently on 3L [place], remains on steroid taper. CXR shows stable interstitial opacities.  3. Renal- creatinine 0.81, weight is trending down, will continue lasix. Electrolytes okay. 4. ID- remains afebrile, leukocytosis with steroid use 5. DM- sugars well controlled  Plan:  Continue to optimize lung function. Adjusted her mucinex to earlier BID dosing so she can continue to cough up secretions before heading to bed. Lung fields are clear on ausculation this morning. Continue flutter valve, incentive spirometer, and ambulation. Continue nebs and steroid taper.    LOS: 24 days    Elgie Collard 10/20/2018  Slowly improving , will ned home o2 I have seen and examined Nelva Nay and agree with the above assessment  and plan.  [name] MD Beeper [phone] Office [phone] 10/20/2018 12:51 PM

## 2018-10-20 NOTE — Progress Notes (Signed)
Walked pt this AM, about 900 ft. Pt did not require any rest breaks, denied SOB. Pt on NRB during whole walk on 10L, however weaned down to 6 to maintain sats around 93-98%. Pt assisted to chair and now 97% on 4L [place].

## 2018-10-20 NOTE — Progress Notes (Signed)
Pt took 5th walk of the day this pm, approx 936ft. Took several rest breaks. O2 titrated up to 8L to maintain sats around 90%. Assisted back to chair and now 94% on 3L.

## 2018-10-21 LAB — GLUCOSE, CAPILLARY: Glucose-Capillary: 77 mg/dL (ref 70–99)

## 2018-10-21 NOTE — Progress Notes (Addendum)
      [address]       [place] [address]             [phone]      21 Days Post-Op Procedure(s) (LRB): CORONARY ARTERY BYPASS GRAFTING (CABG) X THREE, LIMA TO LAD, SVG TO DIAGONAL, SVG TO PDA  USING LEFT INTERNAL MAMMERY ARTERY AND RIGHT AND LEFT GREATER SAPHENOUS VEIN (N/A) TRANSESOPHAGEAL ECHOCARDIOGRAM (TEE) (N/A) LEFT UPPER LOBE WEDGE RESECTION (Left)   Subjective:  No new complaints.  Remains stable overall, continues to desat with ambulation.    Objective: Vital signs in last 24 hours: Temp:  [98 F (36.7 C)-99.2 F (37.3 C)] 99.2 F (37.3 C) (11/18 0355) Pulse Rate:  [65-81] 68 (11/18 0627) Cardiac Rhythm: Normal sinus rhythm (11/18 0701) Resp:  [15-18] 16 (11/18 0355) BP: (94-106)/(56-69) 94/57 (11/18 0627) SpO2:  [93 %-100 %] 99 % (11/18 0355) Weight:  [57.4 kg] 57.4 kg (11/18 0424)  Intake/Output from previous day: 11/17 0701 - 11/18 0700 In: 690 [P.O.:680; I.V.:10] Out: -   General appearance: alert, cooperative and no distress Heart: regular rate and rhythm Lungs: clear to auscultation bilaterally Abdomen: soft, non-tender; bowel sounds normal; no masses,  no organomegaly Extremities: edema trace, much improved Wound: clean and dry, staples remain in place in [facility] sites, no signs of infection, well healed  Lab Results: No results for input(s): WBC, HGB, HCT, PLT in the last 72 hours. BMET: No results for input(s): NA, K, CL, CO2, GLUCOSE, BUN, CREATININE, CALCIUM in the last 72 hours.  PT/INR: No results for input(s): LABPROT, INR in the last 72 hours. ABG    Component Value Date/Time   PHART 7.349 (L) 09/30/2018 2016   HCO3 22.1 09/30/2018 2016   TCO2 33 (H) 10/12/2018 1720   ACIDBASEDEF 3.0 (H) 09/30/2018 2016   O2SAT 95.0 09/30/2018 2016   CBG (last 3)  Recent Labs    10/20/18 1554 10/20/18 2157 10/21/18 0619  GLUCAP 156* 120* 77    Assessment/Plan: S/P Procedure(s) (LRB): CORONARY ARTERY BYPASS GRAFTING (CABG) X  THREE, LIMA TO LAD, SVG TO DIAGONAL, SVG TO PDA  USING LEFT INTERNAL MAMMERY ARTERY AND RIGHT AND LEFT GREATER SAPHENOUS VEIN (N/A) TRANSESOPHAGEAL ECHOCARDIOGRAM (TEE) (N/A) LEFT UPPER LOBE WEDGE RESECTION (Left)  1.CV- NSR, BP labile- continue Lopressor, Digoxin- will touch base with cardiology for discharge medications 2. Pulm- ILD, tolerating sats on 3L, continues to desat with ambulation, will require home use... Will need to have pulm make final recommendations for discharge medication regimen, steroid taper is ordered 3. Renal- creatinine stable, weight is below baseline, continue lasix for now 4.CBGs are normal, patient is not a diabetic, will d/c SSIP and cbg checks 5. Dispo- patient stable, will require home oxygen with activity and sleeping, will require close follow up with Cardiology, Pulmonary at discharge, will await recommendations for discharge medications... If patient remains stable, hopefully ready for d/c this week   LOS: 25 days    Ellwood Handler 10/21/2018  Plan d/c Wednesday on home o2 Will ask cardiology and pulmonary to see before d/c home I have seen and examined Nelva Nay and agree with the above assessment  and plan.  [name] MD Beeper [phone] Office [phone] 10/21/2018 2:41 PM

## 2018-10-21 NOTE — Plan of Care (Signed)

## 2018-10-21 NOTE — Progress Notes (Signed)
CARDIAC REHAB PHASE I   PRE:  Rate/Rhythm: 82 SR  BP:  Sitting: 94/54        SaO2: 94 6 L  MODE:  Ambulation: 470 ft   POST:  Rate/Rhythm: 78 SR  BP:  Sitting: 97/56        SaO2: 97 2L  0920 - 1010  Pt ambulated 470 ft with assistance x1 and on 8L of O2. Tolerated well. Gait was slow and steady. Pt took one standing rest period due to breathlessness, but saturation did not drop below 94% at any point. O2 was decreased to 2L after pt sat back in recliner upon entering the room. Call bell in recliner with pt.   Philis Kendall, MS 10/21/2018 10:06 AM

## 2018-10-22 ENCOUNTER — Other Ambulatory Visit: Payer: Self-pay | Admitting: Cardiothoracic Surgery

## 2018-10-22 MED ORDER — METOPROLOL TARTRATE 25 MG PO TABS
25.0000 mg | ORAL_TABLET | Freq: Two times a day (BID) | ORAL | Status: DC
  Filled 2018-10-22: qty 1

## 2018-10-22 MED ORDER — FUROSEMIDE 20 MG PO TABS
20.0000 mg | ORAL_TABLET | Freq: Every day | ORAL | Status: DC
  Administered 2018-10-23: 20 mg via ORAL
  Filled 2018-10-22: qty 1

## 2018-10-22 NOTE — Progress Notes (Signed)
Progress Note  Patient Name: RANITA STJULIEN Date of Encounter: 10/22/2018  Primary Cardiologist  Harrington Challenger  Subjective   Chest wall pain   Breathing is OK    Inpatient Medications    Scheduled Meds: . buPROPion  300 mg Oral q morning - 10a  . Chlorhexidine Gluconate Cloth  6 each Topical Daily  . cholecalciferol  2,000 Units Oral Daily  . digoxin  0.125 mg Oral Daily  . docusate sodium  200 mg Oral Daily  . enoxaparin (LOVENOX) injection  30 mg Subcutaneous QHS  . feeding supplement  237 mL Oral BID BM  . feeding supplement (ENSURE ENLIVE)  237 mL Oral Daily  . finasteride  2.5 mg Oral Daily  . fluticasone  2 spray Each Nare BID  . fluticasone furoate-vilanterol  1 puff Inhalation Daily  . folic acid  1 mg Oral Daily  . furosemide  40 mg Oral Daily  . guaiFENesin  1,200 mg Oral BID  . ipratropium  0.5 mg Nebulization TID  . levalbuterol  1.25 mg Nebulization TID  . loratadine  10 mg Oral Daily  . mouth rinse  15 mL Mouth Rinse BID  . metoprolol tartrate  25 mg Oral Q8H  . pantoprazole  40 mg Oral QAC breakfast  . potassium chloride  20 mEq Oral Daily  . [START ON 10/23/2018] predniSONE  30 mg Oral Q breakfast   Followed by  . [START ON 10/27/2018] predniSONE  20 mg Oral Q breakfast   Followed by  . [START ON 10/31/2018] predniSONE  10 mg Oral Q breakfast  . sodium chloride flush  3 mL Intravenous Q12H   Continuous Infusions: . sodium chloride    . sodium chloride Stopped (10/20/18 1800)   PRN Meds: sodium chloride, sodium chloride, alum & mag hydroxide-simeth, bisacodyl **OR** bisacodyl, levalbuterol, LORazepam, ondansetron **OR** ondansetron (ZOFRAN) IV, oxyCODONE, sodium chloride flush, sodium chloride flush, traMADol   Vital Signs    Vitals:   10/21/18 2006 10/21/18 2342 10/22/18 0500 10/22/18 0759  BP:  96/60 111/70 99/61  Pulse:  73 77 69  Resp:  20    Temp:  97.6 F (36.4 C) 98.6 F (37 C) 98.7 F (37.1 C)  TempSrc:  Oral Oral Oral  SpO2: 100% 99% 98%  97%  Weight:   57.2 kg   Height:       No intake or output data in the 24 hours ending 10/22/18 0825 Filed Weights   10/20/18 0518 10/21/18 0424 10/22/18 0500  Weight: 57.7 kg 57.4 kg 57.2 kg    Telemetry    SR   Off tele now.   Personally Reviewed  ECG      Physical Exam   GEN: No acute distress.   Neck: No JVD Cardiac: RRR, no murmurs, rubs, or gallops.  Respiratory: Clear to auscultation bilaterally.  Mild decreased airflow   GI: Soft, nontender, non-distended  MS: No edema; No deformity. Neuro:  Nonfocal  Psych: Normal affect   Labs    Chemistry Recent Labs  Lab 10/16/18 0518 10/17/18 0444 10/18/18 0336  NA 129* 131* 128*  K  --  4.3 4.3  CL  --  96* 93*  CO2  --  29 29  GLUCOSE  --  96 89  BUN  --  20 19  CREATININE  --  0.66 0.81  CALCIUM  --  8.2* 8.0*  PROT  --   --  4.8*  ALBUMIN  --   --  2.3*  AST  --   --  32  ALT  --   --  120*  ALKPHOS  --   --  132*  BILITOT  --   --  0.9  GFRNONAA  --  >60 >60  GFRAA  --  >60 >60  ANIONGAP  --  6 6     Hematology Recent Labs  Lab 10/17/18 0444 10/18/18 0336  WBC 19.6* 17.5*  RBC 3.54* 3.56*  HGB 11.3* 11.2*  HCT 34.3* 34.2*  MCV 96.9 96.1  MCH 31.9 31.5  MCHC 32.9 32.7  RDW 13.8 13.9  PLT 445* 391    Cardiac EnzymesNo results for input(s): TROPONINI in the last 168 hours. No results for input(s): TROPIPOC in the last 168 hours.   BNPNo results for input(s): BNP, PROBNP in the last 168 hours.   DDimer No results for input(s): DDIMER in the last 168 hours.   Radiology    No results found.  Cardiac Studies     Patient Profile     62 y.o. female with hx of Pulmonary fibrosis/ bronchiectasis  followed by Dr. Elsworth Soho, coronary calcification by CT, hypertension and hyperlipidemia brought by EMS for evaluation of chest pain.     Assessment & Plan    1  CAD  S/p CABG x 3 along with L upper lobe wedge resection   Now 22 days post op     2  HL   Not on a statin  Had been on  simvistatin prior to admit   Would empirically put on lipitor 40 mg   F/U as outpt   3  HTN  BP has been a little on low side  Cut back on metoprolol to bid  Cut lasix to 20 daily  Follow I/O and BUN Cr    4  Afib  Post op     For questions or updates, please contact [facility] Please consult [email or web address] for contact info under        Signed, Dorris Carnes, MD  10/22/2018, 8:25 AM

## 2018-10-22 NOTE — Progress Notes (Addendum)
      [address]       [place] [address]             [phone]      22 Days Post-Op Procedure(s) (LRB): CORONARY ARTERY BYPASS GRAFTING (CABG) X THREE, LIMA TO LAD, SVG TO DIAGONAL, SVG TO PDA  USING LEFT INTERNAL MAMMERY ARTERY AND RIGHT AND LEFT GREATER SAPHENOUS VEIN (N/A) TRANSESOPHAGEAL ECHOCARDIOGRAM (TEE) (N/A) LEFT UPPER LOBE WEDGE RESECTION (Left)   Subjective:  No new complaints.  Continues to feel better.    Objective: Vital signs in last 24 hours: Temp:  [97.6 F (36.4 C)-98.7 F (37.1 C)] 98.7 F (37.1 C) (11/19 0759) Pulse Rate:  [69-88] 69 (11/19 0759) Cardiac Rhythm: Normal sinus rhythm (11/19 0701) Resp:  [18-20] 20 (11/18 2342) BP: (83-111)/(52-70) 99/61 (11/19 0759) SpO2:  [92 %-100 %] 97 % (11/19 0759) Weight:  [57.2 kg] 57.2 kg (11/19 0500)  Hemodynamic parameters for last 24 hours:    Intake/Output from previous day: No intake/output data recorded. Intake/Output this shift: No intake/output data recorded.  General appearance: alert, cooperative and no distress Heart: regular rate and rhythm Lungs: clear to auscultation bilaterally Abdomen: soft, non-tender; bowel sounds normal; no masses,  no organomegaly Extremities: edema trace Wound: clean, mild skin edge dehiscence no signs of infection, likely due to steroid use  Lab Results: No results for input(s): WBC, HGB, HCT, PLT in the last 72 hours. BMET: No results for input(s): NA, K, CL, CO2, GLUCOSE, BUN, CREATININE, CALCIUM in the last 72 hours.  PT/INR: No results for input(s): LABPROT, INR in the last 72 hours. ABG    Component Value Date/Time   PHART 7.349 (L) 09/30/2018 2016   HCO3 22.1 09/30/2018 2016   TCO2 33 (H) 10/12/2018 1720   ACIDBASEDEF 3.0 (H) 09/30/2018 2016   O2SAT 95.0 09/30/2018 2016   CBG (last 3)  Recent Labs    10/20/18 1554 10/20/18 2157 10/21/18 0619  GLUCAP 156* 120* 77    Assessment/Plan: S/P Procedure(s) (LRB): CORONARY ARTERY  BYPASS GRAFTING (CABG) X THREE, LIMA TO LAD, SVG TO DIAGONAL, SVG TO PDA  USING LEFT INTERNAL MAMMERY ARTERY AND RIGHT AND LEFT GREATER SAPHENOUS VEIN (N/A) TRANSESOPHAGEAL ECHOCARDIOGRAM (TEE) (N/A) LEFT UPPER LOBE WEDGE RESECTION (Left)  1. CV- NSR, on Digoxin, and Lopressor 2. Pulm- ILD, remains on oxygen at 3L , ordered placed for home use... Will continue steroid taper per Pulmonary, spoke with Marni Griffon who will see today ' 3. Renal- creatinine has been stable, remains on lasix 40 mg daily 4. Dispo- patient stable, patient is tolerating 3L of oxygen with ambulation, continue steroid taper per CCM, cardiology is following, will plan to d/c in AM, home health orders have been placed   LOS: 26 days    Ellwood Handler 10/22/2018  Arranging follow up and home health needs today , plan home tomorrow  Plan home tomorrow  I have seen and examined Sarah Escobar and agree with the above assessment  and plan.  [name] MD Beeper [phone] Office [phone] 10/22/2018 11:20 AM

## 2018-10-22 NOTE — Progress Notes (Signed)
CARDIAC REHAB PHASE I   PRE:  Rate/Rhythm: 83 SR  BP:  Sitting: 90/53      SaO2: 90 RA  MODE:  Ambulation: 400 ft 90 peak HR  POST:  Rate/Rhythm: 78 SR  BP:  Sitting: 105/56    SaO2: 90 6L   Pt ambulated 451ft standby assist on 6L of O2 (please see qualifying note). Pt took one standing rest break, guided through purse lipped breathing. D/c education completed with pt and husband. Pt educated to shower daily and monitor incisions. Encouraged continued IS and flutter use. Reinforced sternal precautions. Pt given in-the-tube sheet and heart healthy diet. Reviewed restrictions and exercise guidelines. Will refer to [facility] [place]. Will continue to follow.  [id number] Rufina Falco, RN BSN 10/22/2018 11:01 AM

## 2018-10-22 NOTE — Progress Notes (Signed)
NAME:  Sarah Escobar, MRN:  [id number], DOB:  [birth year], LOS: 66 ADMISSION DATE:  09/26/2018,   Brief History   Admitted s/p CABG, history of ILD, post-op hypoxemia   Past Medical History  CAD, s/p CABG, ILD (?UIP), chronic HP?  Past Medical History:  Diagnosis Date  . Allergy   . Asthma    adult onset  . Depression   . Hyperlipidemia    LDL goal = < 100  . Hypertension   . Osteopenia    mild  . Personal history of adenomatous colonic polyps/FHx colon cancer sister and father    [name]  . Sciatica of left side    intermittent     Significant Hospital Events   CABG  Consults:  TCTS   Procedures:   CABG   Significant Diagnostic Tests:  HRCT 10/26 Spectrum of findings compatible with fibrotic interstitial lung disease with moderate air trapping. Chronic hypersensitivity pneumonitis is favored. Minimal progression of bronchiectasis since 02/14/2018 high-resolution chest CT study. Findings are otherwise stable. Findings are suggestive of an alternative diagnosis (not UIP) per consensus guidelines: Diagnosis of Idiopathic Pulmonary Fibrosis:  Three-vessel coronary atherosclerosis.  Micro Data:   Antimicrobials:  Complete ceftriaxone for serratia   Interim history/subjective:  Doing well overnight. Stable up walking on 3 L oxygen. Is using  BIPAP QHS   Objective   Blood pressure 99/61, pulse 74, temperature 98.7 F (37.1 C), temperature source Oral, resp. rate 20, height 5\' 6"  (1.676 m), weight 57.2 kg, SpO2 100 %.       No intake or output data in the 24 hours ending 10/22/18 0941 Filed Weights   10/20/18 0518 10/21/18 0424 10/22/18 0500  Weight: 57.7 kg 57.4 kg 57.2 kg    Examination: General: Thin female no acute distress, wearing 3 L [place] oxygen HEENT: No adenopathy or JVD Neuro: Awake alert and appropriate CV: s1s2 rrr, no m/r/g PULM: even/non-labored, lungs bilaterally air movement, crackles per bases bilaterally HE:NIDP, non-tender, bsx4  active  Extremities: warm/dry, negative edema , no obvious deformities Skin: no rashes or lesions, warm dry and intact   Resolved Hospital Problem list   CABG>> ready for discharge Acute Hypoxemic Respiratory failure>> Now on 3 L [place]  Assessment & Plan:   Acute hypoxemic respiratory failure secondary to exacerbation of her chronic diffuse parenchymal lung disease, pathology consistent with chronic interstitial lung disease, NSIP versus UIP pathology.  No clear etiology at this time.  Pathology does not seem to be consistent with chronic HP however there is evidence of air trapping and upper lobe predominance on HRCT. Ready for discharge home 11/19 - Discharge on oxygen at 3 L with exertion. - Wean for sats > 88% - Continue home Duonebs TID - Add Xopenex nebs as rescue Q 6 prn wheezing ( previously on albuterol) - Continue Breo 1 puff once daily - Combivent rescue 1 puff up to 4 times daily as needed for breakthrough wheezing - Continue Flonase 2 puffs each nare BID - Continue Claritin 10 mg daily - Continue Mucinex 1200 mg BID with a full glass of water - Consider home PT/OT as she is deconditioned  - Will offer Pulmonary rehab at follow up - Follow up with pulmonary office next week. - Patient already has a 3 month follow up 10/28/2018 at 1:30 pm.I have converted it to a hospital follow up visit.  There is an anxiety component to her dyspnea.Continue her home bedtime ativan. We are optimistic that with rehab and conditioning we  can liberate her from oxygen need  Serratia HCAP - off abx Postop atrial fibrillation - per TCTS, resolved  Leukocytosis - from steroids  Hyponatremia - stable   Best practice:  Diet: regular  Pain/Anxiety/Delirium protocol (if indicated): n/a VAP protocol (if indicated): n/a DVT prophylaxis: lovenox GI prophylaxis: full diet  Glucose control: ssi  Mobility: up walking  Code Status: full  Family Communication: Patient updated Disposition:  Scheduled for transfer to floor  Labs   CBC: Recent Labs  Lab 10/17/18 0444 10/18/18 0336  WBC 19.6* 17.5*  NEUTROABS 17.2*  --   HGB 11.3* 11.2*  HCT 34.3* 34.2*  MCV 96.9 96.1  PLT 445* 141    Basic Metabolic Panel: Recent Labs  Lab 10/16/18 0518 10/17/18 0444 10/18/18 0336  NA 129* 131* 128*  K  --  4.3 4.3  CL  --  96* 93*  CO2  --  29 29  GLUCOSE  --  96 89  BUN  --  20 19  CREATININE  --  0.66 0.81  CALCIUM  --  8.2* 8.0*  MG  --  2.2  --   PHOS  --  2.7  --    GFR: Estimated Creatinine Clearance: 65 mL/min (by C-G formula based on SCr of 0.81 mg/dL). Recent Labs  Lab 10/17/18 0444 10/18/18 0336  WBC 19.6* 17.5*    Liver Function Tests: Recent Labs  Lab 10/18/18 0336  AST 32  ALT 120*  ALKPHOS 132*  BILITOT 0.9  PROT 4.8*  ALBUMIN 2.3*   No results for input(s): LIPASE, AMYLASE in the last 168 hours. No results for input(s): AMMONIA in the last 168 hours.  ABG    Component Value Date/Time   PHART 7.349 (L) 09/30/2018 2016   PCO2ART 40.1 09/30/2018 2016   PO2ART 79.0 (L) 09/30/2018 2016   HCO3 22.1 09/30/2018 2016   TCO2 33 (H) 10/12/2018 1720   ACIDBASEDEF 3.0 (H) 09/30/2018 2016   O2SAT 95.0 09/30/2018 2016     Coagulation Profile: No results for input(s): INR, PROTIME in the last 168 hours.  Cardiac Enzymes: No results for input(s): CKTOTAL, CKMB, CKMBINDEX, TROPONINI in the last 168 hours.  HbA1C: Hgb A1c MFr Bld  Date/Time Value Ref Range Status  09/30/2018 05:00 AM 5.3 4.8 - 5.6 % Final    Comment:    (NOTE) Pre diabetes:          5.7%-6.4% Diabetes:              >6.4% Glycemic control for   <7.0% adults with diabetes     CBG: Recent Labs  Lab 10/20/18 0634 10/20/18 1127 10/20/18 1554 10/20/18 2157 10/21/18 0619  GLUCAP 80 144* 156* 120* 77    Magdalen Spatz, AGACNP-BC Maryanna Shape PCCM Pager # [phone] 10/22/2018, 9:41 AM

## 2018-10-22 NOTE — Progress Notes (Signed)
SATURATION QUALIFICATIONS: (This note is used to comply with regulatory documentation for home oxygen)  Patient Saturations on Room Air at Rest = 90%  Patient Saturations on Room Air while Ambulating = 78%  Patient Saturations on 6 Liters of oxygen while Ambulating = 90%  Please briefly explain why patient needs home oxygen: yes pt needs oxygen, pt desats while ambulating

## 2018-10-22 NOTE — Care Management Note (Signed)
Case Management Note Initial CM note completed by Midge Minium RN, BSN, NCM-BC, ACM-RN [phone] 10/11/2018, 3:51 PM   Patient Details  Name: Sarah Escobar MRN: [id number] Date of Birth: 1956-03-20  Subjective/Objective: 62yo female presented with CP; s/p CABG                  Action/Plan: CM met with patient/spouse to discuss transitional needs. Patient lived at home with spouse PTA, employed with [employer], [place], with no DME in use. PCP verified as: Dr. Lew Dawes; [facility]. Patient s/p CABG and remains on HFNC; will watch for possible home O2 needs. Patient/spouse denied any CM needs at this time, with CM team to continue to follow.  Expected Discharge Date:                  Expected Discharge Plan:  [place]  In-House Referral:  NA  Discharge planning Services  CM Consult  Post Acute Care Choice:  Durable Medical Equipment, Home Health Choice offered to:  Spouse, Patient  DME Arranged:  Oxygen, 3-N-1 DME Agency:  [place]:  RN, PT [facility] Agency:  [place]  Status of Service:  In process, will continue to follow  If discussed at Long Length of Stay Meetings, dates discussed:    Discharge Disposition: home/home health   Additional Comments:  10/22/18- 1145- Marvetta Gibbons RN, CM- follow done for transition of care needs- spoke with pt and husband at bedside regarding HHRN/PT and home 02 needs- pt has decided to remain with [facility] for both [facility] and DME needs at this time choice offered for alternate DME companies. Discussed DME needs- pt declines need for RW or other DME- will arrange home 02 - portable tank to be delivered to room prior to discharge- also discussed using TOC pharmacy for medications-pt and spouse interested in using TOC- will let CM know in am regarding TOC use for medications. Call made to [facility] with [facility] for HHRN/PT and home 02 needs- noted order for 3n1 will  f/u with pt and have it delivered if she decides she wants it.   10/17/18 @ 1248-Natalie Gay RNCM-Home O2 orders entered by attending. CM met with patient/spouse, with DME preference list provided and [employer] selected to service patient's home O2; AVS updated. CM spoke to [place], [facility] liaison via phone and discussed referral. Patient will tentatively transition home over the weekend; home O2 will be delivered closer to that time. CM team will continue to follow.   Marvetta Gibbons RN, BSN Transitions of Care Unit 4E- RN Case Manager [phone] 10/22/2018, 11:39 AM

## 2018-10-23 MED ORDER — METOPROLOL TARTRATE 25 MG PO TABS: 25.0000 mg | ORAL_TABLET | Freq: Two times a day (BID) | ORAL | 3 refills | Status: DC

## 2018-10-23 MED ORDER — DIGOXIN 125 MCG PO TABS: 0.1250 mg | ORAL_TABLET | Freq: Every day | ORAL | 3 refills | Status: DC

## 2018-10-23 MED ORDER — TRAMADOL HCL 50 MG PO TABS: 50.0000 mg | ORAL_TABLET | ORAL | 0 refills | Status: DC | PRN

## 2018-10-23 MED ORDER — IPRATROPIUM-ALBUTEROL 0.5-2.5 (3) MG/3ML IN SOLN: 3.0000 mL | Freq: Three times a day (TID) | RESPIRATORY_TRACT | 2 refills | Status: DC

## 2018-10-23 MED ORDER — LEVALBUTEROL HCL 1.25 MG/0.5ML IN NEBU: 1.2500 mg | INHALATION_SOLUTION | Freq: Four times a day (QID) | RESPIRATORY_TRACT | 1 refills | Status: DC | PRN

## 2018-10-23 MED ORDER — PREDNISONE 10 MG PO TABS: ORAL_TABLET | ORAL | 1 refills | Status: DC

## 2018-10-23 MED ORDER — GUAIFENESIN ER 600 MG PO TB12: 1200.0000 mg | ORAL_TABLET | Freq: Two times a day (BID) | ORAL | 0 refills | Status: AC

## 2018-10-23 MED ORDER — FUROSEMIDE 20 MG PO TABS: 20.0000 mg | ORAL_TABLET | Freq: Every day | ORAL | 0 refills | Status: DC

## 2018-10-23 MED ORDER — POTASSIUM CHLORIDE CRYS ER 10 MEQ PO TBCR: 10.0000 meq | EXTENDED_RELEASE_TABLET | Freq: Every day | ORAL | 3 refills | Status: DC

## 2018-10-23 MED FILL — traMADol HCL 50 MG TABS: 50 | 4 days supply | Qty: 40 | Fill #0

## 2018-10-23 MED FILL — POTASSIUM CHL ER M10 TABLET: 10 | 30 days supply | Qty: 30 | Fill #0

## 2018-10-23 MED FILL — DIGOXIN 0.125 MG TABLET: 125 | 30 days supply | Qty: 30 | Fill #0 | Status: TO

## 2018-10-23 MED FILL — FUROSEMIDE 20 MG TAB: 20 | 30 days supply | Qty: 30 | Fill #0

## 2018-10-23 MED FILL — LEVALBUTEROL HCL 1.25 MG/3M: 1.25 | 7 days supply | Qty: 75 | Fill #0

## 2018-10-23 NOTE — Progress Notes (Addendum)
      [address]       [place] [address]             [phone]      23 Days Post-Op Procedure(s) (LRB): CORONARY ARTERY BYPASS GRAFTING (CABG) X THREE, LIMA TO LAD, SVG TO DIAGONAL, SVG TO PDA  USING LEFT INTERNAL MAMMERY ARTERY AND RIGHT AND LEFT GREATER SAPHENOUS VEIN (N/A) TRANSESOPHAGEAL ECHOCARDIOGRAM (TEE) (N/A) LEFT UPPER LOBE WEDGE RESECTION (Left)   Subjective:  No new complaints.  Excited to be going home.  + ambulation + BM  Objective: Vital signs in last 24 hours: Temp:  [97.7 F (36.5 C)-98.1 F (36.7 C)] 98.1 F (36.7 C) (11/20 0740) Pulse Rate:  [71-94] 83 (11/20 0740) Cardiac Rhythm: Normal sinus rhythm (11/20 0701) Resp:  [18-20] 18 (11/20 0740) BP: (92-120)/(55-77) 110/63 (11/20 0740) SpO2:  [94 %-100 %] 98 % (11/20 0836) Weight:  [57.1 kg] 57.1 kg (11/20 0458)  Intake/Output from previous day: 11/19 0701 - 11/20 0700 In: 940 [P.O.:940] Out: -   General appearance: alert, cooperative and no distress Heart: regular rate and rhythm Lungs: clear to auscultation bilaterally Abdomen: soft, non-tender; bowel sounds normal; no masses,  no organomegaly Extremities: edema trace Wound: clean and dry, mild skin dehiscence, mild oozing from [facility] sites, due to swelling  Lab Results: No results for input(s): WBC, HGB, HCT, PLT in the last 72 hours. BMET: No results for input(s): NA, K, CL, CO2, GLUCOSE, BUN, CREATININE, CALCIUM in the last 72 hours.  PT/INR: No results for input(s): LABPROT, INR in the last 72 hours. ABG    Component Value Date/Time   PHART 7.349 (L) 09/30/2018 2016   HCO3 22.1 09/30/2018 2016   TCO2 33 (H) 10/12/2018 1720   ACIDBASEDEF 3.0 (H) 09/30/2018 2016   O2SAT 95.0 09/30/2018 2016   CBG (last 3)  Recent Labs    10/20/18 1554 10/20/18 2157 10/21/18 0619  GLUCAP 156* 120* 77    Assessment/Plan: S/P Procedure(s) (LRB): CORONARY ARTERY BYPASS GRAFTING (CABG) X THREE, LIMA TO LAD, SVG TO DIAGONAL, SVG TO  PDA  USING LEFT INTERNAL MAMMERY ARTERY AND RIGHT AND LEFT GREATER SAPHENOUS VEIN (N/A) TRANSESOPHAGEAL ECHOCARDIOGRAM (TEE) (N/A) LEFT UPPER LOBE WEDGE RESECTION (Left)  1. CV- NSR, some hypotension, on Digoxin, Lopressor dose decreased per Cardiology 2. Pulm- ILD, stable, continue steroid taper per Pulm, home oxygen arranged 3. Renal- creatinine remains stable 4. ID- EVH sites are clean, some dehiscence, these will be difficult to heal with steroids on board, continue wound care 5. Dispo-  Patient stable, will d/c home today   LOS: 27 days    Erin Barrett 10/23/2018  I have seen and examined Sarah Escobar and agree with the above assessment  and plan.  [name] MD Beeper [phone] Office [phone] 10/23/2018 10:57 AM

## 2018-10-23 NOTE — Discharge Instructions (Signed)
Leg Incisions:  1. Please wash with soap and water daily 2. Cover with clean and dry dressing daily 3. If wounds do not seem to be healing please contact office at [phone] to set up wound check   Discharge Instructions:  1. You may shower, please wash incisions daily with soap and water and keep dry.  If you wish to cover wounds with dressing you may do so but please keep clean and change daily.  No tub baths or swimming until incisions have completely healed.  If your incisions become red or develop any drainage please call our office at [phone]  2. No Driving until cleared by Dr. Everrett Coombe office and you are no longer using narcotic pain medications  3. Monitor your weight daily.. Please use the same scale and weigh at same time... If you gain 5-10 lbs in 48 hours with associated lower extremity swelling, please contact our office at [phone]  4. Fever of 101.5 for at least 24 hours with no source, please contact our office at [phone]  5. Activity- up as tolerated, please walk at least 3 times per day.  Avoid strenuous activity, no lifting, pushing, or pulling with your arms over 8-10 lbs for a minimum of 6 weeks  6. If any questions or concerns arise, please do not hesitate to contact our office at [phone]

## 2018-10-23 NOTE — Progress Notes (Signed)
Note plans for discharge today Appt has been made for pt to be seen by L Kilroy on 11/26 I have asked her to weigh herself daily at home   Take BP, esp if experiences dizziness Call office sooner if experiences any of above.  Sarah Escobar

## 2018-10-23 NOTE — Care Management Note (Signed)
Case Management Note Initial CM note completed by Midge Minium RN, BSN, NCM-BC, ACM-RN [phone] 10/11/2018, 3:51 PM   Patient Details  Name: Sarah Escobar MRN: [id number] Date of Birth: October 05, 1956  Subjective/Objective: 62yo female presented with CP; s/p CABG                  Action/Plan: CM met with patient/spouse to discuss transitional needs. Patient lived at home with spouse PTA, employed with [employer], [place], with no DME in use. PCP verified as: Dr. Lew Dawes; [facility]. Patient s/p CABG and remains on HFNC; will watch for possible home O2 needs. Patient/spouse denied any CM needs at this time, with CM team to continue to follow.  Expected Discharge Date:  10/23/18               Expected Discharge Plan:  [place]  In-House Referral:  NA  Discharge planning Services  CM Consult  Post Acute Care Choice:  Durable Medical Equipment, Home Health Choice offered to:  Spouse, Patient  DME Arranged:  Oxygen, 3-N-1 DME Agency:  [place]:  RN, PT [facility] Agency:  [place]  Status of Service:  Completed, signed off  If discussed at [place] of Stay Meetings, dates discussed:    Discharge Disposition: home/home health   Additional Comments:    10/23/18-1030-Jacqulyne Gladue, RN CM- Follow up for DME order for 3:1. Pt states if no copay, she will accept equipment. Call made to [facility] with [facility] to check on copay (if no copay will be delivered to room along with portable O2). Confirmed with [facility] pharmacy patient's meds to be delivered to patient bedside prior to discharge.  10/22/18- 1145- Kristi Webster RN, CM- follow done for transition of care needs- spoke with pt and husband at bedside regarding HHRN/PT and home 02 needs- pt has decided to remain with [facility] for both [facility] and DME needs at this time choice offered for alternate DME companies. Discussed DME needs- pt declines need for RW  or other DME- will arrange home 02 - portable tank to be delivered to room prior to discharge- also discussed using TOC pharmacy for medications-pt and spouse interested in using TOC- will let CM know in am regarding TOC use for medications. Call made to [facility] with [facility] for HHRN/PT and home 02 needs- noted order for 3n1 will f/u with pt and have it delivered if she decides she wants it.   10/17/18 @ 1248-Natalie Gay RNCM-Home O2 orders entered by attending. CM met with patient/spouse, with DME preference list provided and [facility] selected to service patient's home O2; AVS updated. CM spoke to [place], [facility] liaison via phone and discussed referral. Patient will tentatively transition home over the weekend; home O2 will be delivered closer to that time. CM team will continue to follow.   [name] Silvas RN MSN CCM Transitions of Care Unit 4E- RN Case Manager [phone] 10/23/2018, 12:50 PM

## 2018-10-23 NOTE — Progress Notes (Signed)
Patient is ready for discharge. She has been taken off of telemetry, IV has been removed without complication, catheter intact. Patient has had all of her questions regarding discharge answered. She will be transported home by her husband who is here with her. She will leave the unit by wheelchair and meet husband at the front entrance of the hospital.

## 2018-10-23 NOTE — Progress Notes (Signed)
CARDIAC REHAB PHASE I    Pt for d/c today. Reviewed d/c ed with pt and husband. CM helping with meds and oxygen needs. Pt referred to [facility] [place].   [id number] Rufina Falco, RN BSN 10/23/2018 10:15 AM

## 2018-10-24 ENCOUNTER — Telehealth: Payer: Self-pay

## 2018-10-24 ENCOUNTER — Telehealth (HOSPITAL_COMMUNITY): Payer: Self-pay

## 2018-10-24 DIAGNOSIS — I480 Paroxysmal atrial fibrillation: Secondary | ICD-10-CM | POA: Diagnosis not present

## 2018-10-24 DIAGNOSIS — J9611 Chronic respiratory failure with hypoxia: Secondary | ICD-10-CM | POA: Diagnosis not present

## 2018-10-24 DIAGNOSIS — J45909 Unspecified asthma, uncomplicated: Secondary | ICD-10-CM | POA: Diagnosis not present

## 2018-10-24 DIAGNOSIS — I2510 Atherosclerotic heart disease of native coronary artery without angina pectoris: Secondary | ICD-10-CM | POA: Diagnosis not present

## 2018-10-24 DIAGNOSIS — J479 Bronchiectasis, uncomplicated: Secondary | ICD-10-CM | POA: Diagnosis not present

## 2018-10-24 DIAGNOSIS — Z48812 Encounter for surgical aftercare following surgery on the circulatory system: Secondary | ICD-10-CM | POA: Diagnosis not present

## 2018-10-24 DIAGNOSIS — J84112 Idiopathic pulmonary fibrosis: Secondary | ICD-10-CM | POA: Diagnosis not present

## 2018-10-24 DIAGNOSIS — I10 Essential (primary) hypertension: Secondary | ICD-10-CM | POA: Diagnosis not present

## 2018-10-24 DIAGNOSIS — I214 Non-ST elevation (NSTEMI) myocardial infarction: Secondary | ICD-10-CM | POA: Diagnosis not present

## 2018-10-24 DIAGNOSIS — I700 Atherosclerosis of aorta: Secondary | ICD-10-CM | POA: Diagnosis not present

## 2018-10-24 NOTE — Telephone Encounter (Signed)
Called patient to see if she is interested in the Cardiac Rehab Program. Patient expressed interest. Explained scheduling process and went over insurance, patient verbalized understanding. Will contact patient for scheduling once f/u has been completed.  Mailed pt a CR brochure.

## 2018-10-24 NOTE — Telephone Encounter (Signed)
Pt insurance is active and benefits verified through [employer]. Co-pay $0.00, DED $1,000.00/$1,000.00 met, out of pocket $5,000.00/$4,389.10 met, co-insurance 20%. No pre-authorization required. Passport, 10/24/18 @ 3:08PM, REF# [id number]  Will contact patient to see if she is interested in the Cardiac Rehab Program. If interested, patient will need to complete follow up appt. Once completed, patient will be contacted for scheduling upon review by the RN Navigator.

## 2018-10-24 NOTE — Telephone Encounter (Signed)
Sarah Escobar, nurse with [facility] [phone], contacted the office requesting verbal orders for home health plan of care.  Sarah Escobar is s/p CABG x3 09/30/18 with Dr. Servando Snare.  Verbal orders given for 1 time a week for 3 weeks for Cardiopulmonary Assessment, Medication education, and wound care management.  She also asked about patient taking Metoprolol 25 mg BID.  Patient stated that she was only to take the medication if her blood pressure systolic was above 664.  I advised for [place] to contact patient's Cardiologist office, Dr. Harrington Challenger to get clarification.  Patient was unsure of who told her this.

## 2018-10-25 ENCOUNTER — Telehealth: Payer: Self-pay

## 2018-10-25 NOTE — Telephone Encounter (Signed)
Sarah Escobar, PT with [facility] [phone] contacted the office requesting verbal orders for Physical Therapy 1 time a week for 1 week, 2 times a week for 2 weeks, and 1 time a week for 1 week.  Verbal orders given, will await faxed copy to be signed.   He also stated that Ms. Merritts's resting heart rate was 104.  He stated he has to call if HR is over 100 bpm.  I again advised PT to contact Cardiology regarding medications as they were adjusted in the hospital before discharge.

## 2018-10-28 ENCOUNTER — Ambulatory Visit: Payer: Self-pay | Admitting: Pulmonary Disease

## 2018-10-28 ENCOUNTER — Inpatient Hospital Stay: Payer: 59 | Admitting: Acute Care

## 2018-10-29 ENCOUNTER — Encounter: Payer: Self-pay | Admitting: Cardiology

## 2018-10-29 ENCOUNTER — Telehealth: Payer: Self-pay

## 2018-10-29 ENCOUNTER — Ambulatory Visit (INDEPENDENT_AMBULATORY_CARE_PROVIDER_SITE_OTHER): Payer: 59 | Admitting: Cardiology

## 2018-10-29 DIAGNOSIS — Z951 Presence of aortocoronary bypass graft: Secondary | ICD-10-CM

## 2018-10-29 DIAGNOSIS — J479 Bronchiectasis, uncomplicated: Secondary | ICD-10-CM | POA: Diagnosis not present

## 2018-10-29 DIAGNOSIS — Z48812 Encounter for surgical aftercare following surgery on the circulatory system: Secondary | ICD-10-CM | POA: Diagnosis not present

## 2018-10-29 DIAGNOSIS — J849 Interstitial pulmonary disease, unspecified: Secondary | ICD-10-CM | POA: Diagnosis not present

## 2018-10-29 DIAGNOSIS — I10 Essential (primary) hypertension: Secondary | ICD-10-CM | POA: Diagnosis not present

## 2018-10-29 DIAGNOSIS — I700 Atherosclerosis of aorta: Secondary | ICD-10-CM | POA: Diagnosis not present

## 2018-10-29 DIAGNOSIS — J45909 Unspecified asthma, uncomplicated: Secondary | ICD-10-CM | POA: Diagnosis not present

## 2018-10-29 DIAGNOSIS — I2510 Atherosclerotic heart disease of native coronary artery without angina pectoris: Secondary | ICD-10-CM | POA: Diagnosis not present

## 2018-10-29 DIAGNOSIS — I255 Ischemic cardiomyopathy: Secondary | ICD-10-CM

## 2018-10-29 DIAGNOSIS — I480 Paroxysmal atrial fibrillation: Secondary | ICD-10-CM | POA: Diagnosis not present

## 2018-10-29 DIAGNOSIS — I214 Non-ST elevation (NSTEMI) myocardial infarction: Secondary | ICD-10-CM | POA: Diagnosis not present

## 2018-10-29 DIAGNOSIS — J84112 Idiopathic pulmonary fibrosis: Secondary | ICD-10-CM | POA: Diagnosis not present

## 2018-10-29 DIAGNOSIS — J9611 Chronic respiratory failure with hypoxia: Secondary | ICD-10-CM | POA: Diagnosis not present

## 2018-10-29 NOTE — Telephone Encounter (Signed)
Patient contacted the office concerned about last bowel movement.  She stated she had some bright red blood in her stool.  I asked if she has been straining during bowel movements, she stated yes.  She has started taking miralax for constipation issues.  I also asked if she had hemorrhoids. She stated, no.  I advised that if this persists she needed to go see her PCP.  She acknowledged receipt.

## 2018-10-29 NOTE — Progress Notes (Signed)
10/29/2018 Sarah Escobar   13-Feb-1956  [id number]  Primary Physician Plotnikov, Evie Lacks, MD Primary Cardiologist: Dr Oval Linsey  HPI: Ms. Cobos is a pleasant 62 year old female with no prior history of coronary disease who had been followed by Dr. Elsworth Soho for interstitial lung disease.  She presented with a non-ST elevation MI 10/01/2018.  She underwent bypass grafting x3 on 10/01/2018.  Her LV function by echocardiogram preop was 40 to 45%.  Her postoperative course was complicated by respiratory failure, PAF, and volume overload.  At one point she was placed on amiodarone.  It was felt this was causing exacerbation of her interstitial lung disease and it was discontinued.  She also had hemoptysis and so it was not felt to be safe to anticoagulate her, her CHADS VASC score is 3.  She remained in the hospital until December 21.  She seen in the office today for post hospital transition of care visit.  She is in the office today with her husband.  She is on oxygen.  Fortunately her EKG shows normal sinus rhythm.  She does have dyspnea with conversation.  She denies orthopnea or tachycardia at home.  She had a list of questions about her medications and her medical issues and we went over these in detail.  She has had borderline low blood pressure at home but she has not been orthostatic and I suggested she continue her Lopressor as directed.   Current Outpatient Medications  Medication Sig Dispense Refill  . Biotin 5000 MCG CAPS Take 5,000 mcg by mouth daily.     Marland Kitchen buPROPion (WELLBUTRIN XL) 300 MG 24 hr tablet Take 1 tablet (300 mg total) by mouth every morning. Must keep upcoming appt to get refills 90 tablet 0  . Cholecalciferol 2000 units TABS Take 2,000 Units by mouth daily.     . digoxin (LANOXIN) 0.125 MG tablet Take 1 tablet (0.125 mg total) by mouth daily. 30 tablet 3  . estrogen-methylTESTOSTERone 0.625-1.25 MG tablet Take 1 tablet by mouth daily.    . feeding supplement, ENSURE ENLIVE,  (ENSURE ENLIVE) LIQD Take 237 mLs by mouth 2 (two) times daily between meals. (Patient taking differently: Take 237 mLs by mouth 3 (three) times a week. ) 237 mL 12  . fexofenadine (ALLEGRA) 180 MG tablet Take 180 mg by mouth daily.    . finasteride (PROSCAR) 5 MG tablet Take 2.5 mg by mouth daily.     . Flaxseed, Linseed, (FLAXSEED OIL) 1000 MG CAPS Take 1,000 mg by mouth 2 (two) times daily.     . fluticasone (FLONASE) 50 MCG/ACT nasal spray USE 1 SPRAY IN EACH NOSTRIL TWO TIMES A DAY AS NEEDED (Patient taking differently: Place 1 spray into both nostrils 2 (two) times daily as needed for allergies. ) 48 g 3  . fluticasone furoate-vilanterol (BREO ELLIPTA) 100-25 MCG/INH AEPB USE 1 INHALATION DAILY (Patient taking differently: Inhale 1 puff into the lungs daily. ) 180 each 3  . furosemide (LASIX) 20 MG tablet Take 1 tablet (20 mg total) by mouth daily. 30 tablet 0  . guaiFENesin (MUCINEX) 600 MG 12 hr tablet Take 2 tablets (1,200 mg total) by mouth 2 (two) times daily. With full glass of water 60 tablet 0  . Ipratropium-Albuterol (COMBIVENT RESPIMAT) 20-100 MCG/ACT AERS respimat Inhale 1 puff into the lungs 4 (four) times daily as needed for wheezing. 3 Inhaler 3  . ipratropium-albuterol (DUONEB) 0.5-2.5 (3) MG/3ML SOLN Take 3 mLs by nebulization every 8 (eight) hours. 360 mL  2  . levalbuterol (XOPENEX) 1.25 MG/0.5ML nebulizer solution Take 1.25 mg by nebulization every 6 (six) hours as needed for wheezing or shortness of breath. 30 each 1  . LORazepam (ATIVAN) 0.5 MG tablet Take 1-2 tablets (0.5-1 mg total) by mouth at bedtime as needed for anxiety. Patient needs office visit before refills will be given (Patient taking differently: Take 0.5 mg by mouth at bedtime as needed for anxiety. Patient needs office visit before refills will be given) 60 tablet 2  . metoprolol tartrate (LOPRESSOR) 25 MG tablet Take 1 tablet (25 mg total) by mouth 2 (two) times daily. 60 tablet 3  . minoxidil (ROGAINE) 2 %  external solution Apply 1 application topically as needed (for hair loss).     . Multiple Vitamins-Minerals (ONE-A-DAY 50 PLUS PO) Take 1 tablet by mouth daily.     . potassium chloride SA (K-DUR,KLOR-CON) 10 MEQ tablet Take 1 tablet (10 mEq total) by mouth daily. 30 tablet 3  . predniSONE (DELTASONE) 10 MG tablet Take 30 mg (3 tab) daily for 3 days Starting 11/24 take 20 mg (2 tab) daily for 4 days Starting 11/28 take 10 mg daily 90 tablet 1  . Respiratory Therapy Supplies (FLUTTER) DEVI Use a directed. 1 each 0  . simvastatin (ZOCOR) 20 MG tablet TAKE 1 TABLET BY MOUTH  DAILY AT 6 PM. (Patient taking differently: Take 20 mg by mouth at bedtime. ) 90 tablet 3  . traMADol (ULTRAM) 50 MG tablet Take 1-2 tablets (50-100 mg total) by mouth every 4 (four) hours as needed for moderate pain. 40 tablet 0   No current facility-administered medications for this visit.     Allergies  Allergen Reactions  . Flagyl [Metronidazole] Other (See Comments)    Nerve pain  . Levofloxacin Rash  . Loperamide Hcl Rash  . Neomycin-Bacitracin Zn-Polymyx Rash  . Ofloxacin Rash  . Sulfonamide Derivatives Hives  . Band-Aid Liquid Bandage [New Skin] Other (See Comments)    Regular band-aid cause itching and redness  . Collodion Dermatitis  . Gabapentin Other (See Comments)    Mental status changes  . Latex Other (See Comments) and Dermatitis    "redness"  . Sulfa Antibiotics Hives  . Oxycontin [Oxycodone Hcl] Rash         Past Medical History:  Diagnosis Date  . Allergy   . Asthma    adult onset  . Depression   . Hyperlipidemia    LDL goal = < 100  . Hypertension   . Osteopenia    mild  . Personal history of adenomatous colonic polyps/FHx colon cancer sister and father    [name]  . Sciatica of left side    intermittent    Social History   Socioeconomic History  . Marital status: Married    Spouse name: Not on file  . Number of children: 1  . Years of education: Not on file  .  Highest education level: Not on file  Occupational History    Employer: [employer]  . Financial resource strain: Not on file  . Food insecurity:    Worry: Not on file    Inability: Not on file  . Transportation needs:    Medical: Not on file    Non-medical: Not on file  Tobacco Use  . Smoking status: Former Smoker    Packs/day: 1.00    Years: 30.00    Pack years: 30.00    Types: Cigarettes    Last  attempt to quit: 12/04/2001    Years since quitting: 16.9  . Smokeless tobacco: Never Used  . Tobacco comment:  smoked 1973-2003, up to 1.5 ppd  Substance and Sexual Activity  . Alcohol use: Yes    Alcohol/week: 0.0 standard drinks    Comment: occasionally  . Drug use: No  . Sexual activity: Not on file  Lifestyle  . Physical activity:    Days per week: Not on file    Minutes per session: Not on file  . Stress: Not on file  Relationships  . Social connections:    Talks on phone: Not on file    Gets together: Not on file    Attends religious service: Not on file    Active member of club or organization: Not on file    Attends meetings of clubs or organizations: Not on file    Relationship status: Not on file  . Intimate partner violence:    Fear of current or ex partner: Not on file    Emotionally abused: Not on file    Physically abused: Not on file    Forced sexual activity: Not on file  Other Topics Concern  . Not on file  Social History Narrative  . Not on file     Family History  Problem Relation Age of Onset  . Colon cancer Father   . Heart attack Father 38  . Diabetes Father   . Stroke Father 49  . Colon cancer Sister   . Asthma Sister   . Asthma Mother   . Colitis Mother   . Cirrhosis Mother        non alcoholic, fatty liver  . Asthma Maternal Aunt   . Heart attack Sister 72  . Cirrhosis Brother        non alcoholic  . Colon polyps Brother   . Anemia Brother         Spherocytosis, hereditrary  . Asthma Maternal Grandmother   .  Kidney failure Unknown        2 bro, 1 sister ? from HTN  . Prostate cancer Brother   . Breast cancer Neg Hx      Review of Systems: General: negative for chills, fever, night sweats or weight changes.  Cardiovascular: negative for chest pain, dyspnea on exertion, edema, orthopnea, palpitations, paroxysmal nocturnal dyspnea or shortness of breath Dermatological: negative for rash Respiratory: negative for cough or wheezing Urologic: negative for hematuria Abdominal: negative for nausea, vomiting, diarrhea, bright red blood per rectum, melena, or hematemesis Neurologic: negative for visual changes, syncope, or dizziness All other systems reviewed and are otherwise negative except as noted above.    Blood pressure 90/62, pulse 71, height 5\' 6"  (1.676 m), weight 127 lb (57.6 kg).  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 Not anticoagulated secondary to hemoptysis  ILD (interstitial lung disease) (  [place]) Not a candidate for Amiodarone.  Ischemic cardiomyopathy EF 40-45% by pre op echo   PLAN same medical therapy for now.  She should see Dr. Oval Linsey in 6 to 8 weeks.  She has an upcoming appointment with Dr. Elsworth Soho and Dr. Servando Snare.  Kerin Ransom  PA-C 10/29/2018 11:12 AM

## 2018-10-29 NOTE — Assessment & Plan Note (Signed)
Patient had PAF postop, she initially received amiodarone but this was discontinued because of complications from pulmonary disease, she was discharged in normal sinus rhythm. Not anticoagulated secondary to hemoptysis

## 2018-10-29 NOTE — Assessment & Plan Note (Signed)
Admitted 09/26/18 with NSTEMI

## 2018-10-29 NOTE — Assessment & Plan Note (Signed)
CABG x3 10/01/18- post op course complicated by PAF, respiratory failure

## 2018-10-29 NOTE — Assessment & Plan Note (Signed)
Not a candidate for Amiodarone.

## 2018-10-29 NOTE — Assessment & Plan Note (Signed)
EF 40-45% by pre op echo

## 2018-10-29 NOTE — Patient Instructions (Signed)
Medication Instructions:  Your physician recommends that you continue on your current medications as directed. Please refer to the Current Medication list given to you today. If you need a refill on your cardiac medications before your next appointment, please call your pharmacy.   Lab work: None  If you have labs (blood work) drawn today and your tests are completely normal, you will receive your results only by: Marland Kitchen MyChart Message (if you have MyChart) OR . A paper copy in the mail If you have any lab test that is abnormal or we need to change your treatment, we will call you to review the results.  Testing/Procedures: Non e  Follow-Up: At [facility], you and your health needs are our priority.  As part of our continuing mission to provide you with exceptional heart care, we have created designated Provider Care Teams.  These Care Teams include your primary Cardiologist (physician) and Advanced Practice Providers (APPs -  Physician Assistants and Nurse Practitioners) who all work together to provide you with the care you need, when you need it. . Your physician recommends that you schedule a follow-up appointment in: 6-8 weeks with Dr Oval Linsey.  Any Other Special Instructions Will Be Listed Below (If Applicable).

## 2018-10-31 LAB — FUNGUS CULTURE RESULT

## 2018-10-31 LAB — FUNGUS CULTURE WITH STAIN

## 2018-10-31 LAB — FUNGAL ORGANISM REFLEX

## 2018-11-04 DIAGNOSIS — I480 Paroxysmal atrial fibrillation: Secondary | ICD-10-CM | POA: Diagnosis not present

## 2018-11-04 DIAGNOSIS — I10 Essential (primary) hypertension: Secondary | ICD-10-CM | POA: Diagnosis not present

## 2018-11-04 DIAGNOSIS — J84112 Idiopathic pulmonary fibrosis: Secondary | ICD-10-CM | POA: Diagnosis not present

## 2018-11-04 DIAGNOSIS — J9611 Chronic respiratory failure with hypoxia: Secondary | ICD-10-CM | POA: Diagnosis not present

## 2018-11-04 DIAGNOSIS — I214 Non-ST elevation (NSTEMI) myocardial infarction: Secondary | ICD-10-CM | POA: Diagnosis not present

## 2018-11-04 DIAGNOSIS — J45909 Unspecified asthma, uncomplicated: Secondary | ICD-10-CM | POA: Diagnosis not present

## 2018-11-04 DIAGNOSIS — J479 Bronchiectasis, uncomplicated: Secondary | ICD-10-CM | POA: Diagnosis not present

## 2018-11-04 DIAGNOSIS — I700 Atherosclerosis of aorta: Secondary | ICD-10-CM | POA: Diagnosis not present

## 2018-11-04 DIAGNOSIS — I2510 Atherosclerotic heart disease of native coronary artery without angina pectoris: Secondary | ICD-10-CM | POA: Diagnosis not present

## 2018-11-04 DIAGNOSIS — Z48812 Encounter for surgical aftercare following surgery on the circulatory system: Secondary | ICD-10-CM | POA: Diagnosis not present

## 2018-11-04 NOTE — Telephone Encounter (Signed)
Attempted to call patient in regards to Cardiac Rehab - LM on VM 

## 2018-11-05 ENCOUNTER — Telehealth: Payer: Self-pay | Admitting: Pulmonary Disease

## 2018-11-05 MED ORDER — CEPHALEXIN 500 MG PO CAPS: 500.0000 mg | ORAL_CAPSULE | Freq: Three times a day (TID) | ORAL | 0 refills | Status: AC

## 2018-11-05 MED ORDER — PREDNISONE 10 MG PO TABS: ORAL_TABLET | ORAL | 0 refills | Status: DC

## 2018-11-05 NOTE — Telephone Encounter (Signed)
Called and spoke to patient. Patient stated that she has been having a productive cough (yellow) and nasal congestion. Patient reports increased shortness of breath but is wearing oxygen via nasal canula 3L at rest and 6 with exertion. Patient stated that she hasn't had a fever but is concerned due to recent open heart surgery. Patient declined visit with NP and stated she is seeing Dr. Elsworth Soho on 12/9.    Dr. Elsworth Soho please advise. Thanks

## 2018-11-05 NOTE — Telephone Encounter (Signed)
Please take Take prednisone 40mg  once daily x 3 days, then 30mg  once daily x 3 days, then 20mg  once daily x 3 days, then prednisone 10mg  once daily  x 3 days and stop  cephalexin 500mg  three times daily x  5 days  If not improving or getting worse needs to come in this week because ILD flare is top ddx  Allergies  Allergen Reactions  . Flagyl [Metronidazole] Other (See Comments)    Nerve pain  . Levofloxacin Rash  . Loperamide Hcl Rash  . Neomycin-Bacitracin Zn-Polymyx Rash  . Ofloxacin Rash  . Sulfonamide Derivatives Hives  . Band-Aid Liquid Bandage [New Skin] Other (See Comments)    Regular band-aid cause itching and redness  . Collodion Dermatitis  . Gabapentin Other (See Comments)    Mental status changes  . Latex Other (See Comments) and Dermatitis    "redness"  . Sulfa Antibiotics Hives  . Oxycontin [Oxycodone Hcl] Rash

## 2018-11-05 NOTE — Telephone Encounter (Signed)
Called and spoke to patient and gave her Dr. Golden Pop recommendations. Placed orders per MR. Patient verbalized understanding. Nothing further needed at this time.

## 2018-11-06 ENCOUNTER — Telehealth: Payer: Self-pay | Admitting: Pulmonary Disease

## 2018-11-06 DIAGNOSIS — I214 Non-ST elevation (NSTEMI) myocardial infarction: Secondary | ICD-10-CM | POA: Diagnosis not present

## 2018-11-06 DIAGNOSIS — I2510 Atherosclerotic heart disease of native coronary artery without angina pectoris: Secondary | ICD-10-CM | POA: Diagnosis not present

## 2018-11-06 DIAGNOSIS — J9611 Chronic respiratory failure with hypoxia: Secondary | ICD-10-CM | POA: Diagnosis not present

## 2018-11-06 DIAGNOSIS — I700 Atherosclerosis of aorta: Secondary | ICD-10-CM | POA: Diagnosis not present

## 2018-11-06 DIAGNOSIS — I10 Essential (primary) hypertension: Secondary | ICD-10-CM | POA: Diagnosis not present

## 2018-11-06 DIAGNOSIS — J45909 Unspecified asthma, uncomplicated: Secondary | ICD-10-CM | POA: Diagnosis not present

## 2018-11-06 DIAGNOSIS — J479 Bronchiectasis, uncomplicated: Secondary | ICD-10-CM | POA: Diagnosis not present

## 2018-11-06 DIAGNOSIS — Z48812 Encounter for surgical aftercare following surgery on the circulatory system: Secondary | ICD-10-CM | POA: Diagnosis not present

## 2018-11-06 DIAGNOSIS — I480 Paroxysmal atrial fibrillation: Secondary | ICD-10-CM | POA: Diagnosis not present

## 2018-11-06 DIAGNOSIS — J84112 Idiopathic pulmonary fibrosis: Secondary | ICD-10-CM | POA: Diagnosis not present

## 2018-11-06 NOTE — Telephone Encounter (Signed)
Pt wanting to make RA aware that MR prescribed prednisone and cephalexin yesterday.  I advised that this is in her chart and that I would forward this to RA as FYI.  Pt expressed understanding.  Verified appt next week.  Nothing further needed at this time- forwarding to RA as FYI.

## 2018-11-11 ENCOUNTER — Encounter: Payer: Self-pay | Admitting: Pulmonary Disease

## 2018-11-11 ENCOUNTER — Ambulatory Visit: Payer: 59 | Admitting: Pulmonary Disease

## 2018-11-11 DIAGNOSIS — J189 Pneumonia, unspecified organism: Secondary | ICD-10-CM

## 2018-11-11 DIAGNOSIS — J849 Interstitial pulmonary disease, unspecified: Secondary | ICD-10-CM

## 2018-11-11 DIAGNOSIS — J9611 Chronic respiratory failure with hypoxia: Secondary | ICD-10-CM | POA: Diagnosis not present

## 2018-11-11 MED ORDER — PREDNISONE 5 MG PO TABS: ORAL_TABLET | ORAL | 0 refills | Status: DC

## 2018-11-11 NOTE — Assessment & Plan Note (Addendum)
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 We will do an extended taper of prednisone Check sputum culture.  Stay on prednisone 20 mg daily On December 25, decrease to 15 mg daily   We discussed medications for pulmonary fibrosis including OFEV & perfenidone  Greater than 50% time of 6mins  was spent in counseling and coordination of care with the patient

## 2018-11-11 NOTE — Assessment & Plan Note (Signed)
Treated with 14 days of ceftriaxone as inpatient and then again with cephalexin as outpatient. Will obtain sputum culture to clarify before giving any more antibiotics

## 2018-11-11 NOTE — Progress Notes (Signed)
Subjective:    Patient ID: Sarah Escobar, female    DOB: 12-Dec-1955, 61 y.o.   MRN: [id number]  HPI  10 yoex-smoker with asthmatic bronchitis & bronchiectasis, hypersensitive to perfumes. She smoked 1 PPD for about 30-pack-years before quitting in 2003. She hasrepeated sinus infections, had multiple sinus surgeries -saw ENT Other issues include hair loss and yellowing of her nails She underwent CABG & OLBx 09/2018 Postop course complicated by severe hypoxia requiring high flow nasal cannula and  atrial fibrillation.  She responded gradually to high-dose prednisone and by discharge was requiring 4 L nasal cannula.  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 Follow-up    3 mo f/u & hosp f/u. Per patient she has been in the hospital since her last visit.     She called the office for cough with yellow mucus and was prescribed prednisone and cephalexin She reports oxygen saturation stays 93 to 95% at rest on 3 L but on walking she tends to desaturate sometimes to the high 70s and takes a few minutes to recover.  She still reports occasional yellow sputum and wonders if she needs more antibiotics  Significant tests/ events reviewed  PFT 04/2014 - no obstruction, preserved lung volumes, DLCO 76%    HRCT 02/2014- Nonspecific pattern of interstitial lung disease with bronchiectasis, subpleural reticulation and architectural distortion. 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 Allergy   . Asthma    adult onset  . Depression   . Hyperlipidemia    LDL goal = < 100  . Hypertension   . Osteopenia    mild  . Personal history of adenomatous colonic polyps/FHx colon cancer sister and father    [name]  . Sciatica of left side    intermittent    Past Surgical History:  Procedure Laterality Date  . BUNIONECTOMY    . CERVICAL FUSION  1999   Dr Arnoldo Morale, [place]  . COLONOSCOPY  2013  . COLONOSCOPY W/ POLYPECTOMY  multiple   adenomas; [name]; last 2013  . CORONARY ARTERY BYPASS GRAFT N/A 09/30/2018   Procedure: CORONARY ARTERY BYPASS GRAFTING (CABG) X THREE, LIMA TO LAD, SVG TO DIAGONAL, SVG TO PDA  USING LEFT INTERNAL MAMMERY ARTERY AND RIGHT AND LEFT GREATER SAPHENOUS VEIN;  Surgeon: [name], MD;  Location: [facility];  Service: Open Heart Surgery;  Laterality: N/A;  . discetomy lumbar  02/19/13   L1&2; Dr Arnoldo Morale, [place]  . LEFT HEART CATH AND CORONARY ANGIOGRAPHY N/A 09/26/2018   Procedure: LEFT HEART CATH AND CORONARY ANGIOGRAPHY;  Surgeon: Sarah Bush, MD;  Location: [facility];  Service: Cardiovascular;  Laterality: N/A;  . NASAL SINUS SURGERY  [phone]   X 3  . POLYPECTOMY  1217--2013   TA+  . TEE WITHOUT CARDIOVERSION N/A 09/30/2018   Procedure: TRANSESOPHAGEAL ECHOCARDIOGRAM (TEE);  Surgeon: [name], MD;  Location: [facility];  Service: Open Heart Surgery;  Laterality: N/A;  . TOTAL ABDOMINAL HYSTERECTOMY W/ BILATERAL SALPINGOOPHORECTOMY  2003   endometriosis   . VIDEO BRONCHOSCOPY Bilateral 03/04/2018   Procedure: VIDEO BRONCHOSCOPY WITH FLUORO;  Surgeon: Rigoberto Noel, MD;  Location: [facility];  Service: Cardiopulmonary;  Laterality: Bilateral;  . WEDGE RESECTION Left 09/30/2018   Procedure: LEFT UPPER LOBE WEDGE RESECTION;  Surgeon: [name], MD;  Location: [facility];  Service: Open Heart Surgery;  Laterality: Left;    Allergies  Allergen  Reactions  . Flagyl [Metronidazole] Other (See Comments)    Nerve pain  . Levofloxacin Rash  . Loperamide Hcl Rash  . Neomycin-Bacitracin Zn-Polymyx Rash  . Ofloxacin Rash  . Sulfonamide Derivatives Hives  . Band-Aid Liquid Bandage [New Skin] Other (See Comments)    Regular band-aid cause itching and redness  . Collodion Dermatitis  . Gabapentin Other (See Comments)    Mental status changes  . Latex Other (See Comments) and Dermatitis    "redness"  . Sulfa Antibiotics Hives  . Oxycontin [Oxycodone Hcl] Rash         Social History   Socioeconomic History  . Marital status: Married    Spouse name: Not on file  . Number of children: 1  . Years of education: Not on file  . Highest education level: Not on file  Occupational History    Employer: [employer]  . Financial resource strain: Not on file  . Food insecurity:    Worry: Not on file    Inability: Not on file  . Transportation needs:    Medical: Not on file    Non-medical: Not on file  Tobacco Use  . Smoking status: Former Smoker    Packs/day: 1.00    Years: 30.00    Pack years: 30.00    Types: Cigarettes    Last attempt to quit: 12/04/2001    Years since quitting: 16.9  . Smokeless tobacco: Never Used  . Tobacco comment:  smoked 1973-2003, up to 1.5 ppd  Substance and Sexual Activity  . Alcohol use: Yes    Alcohol/week: 0.0 standard drinks    Comment: occasionally  . Drug use: No  . Sexual activity: Not on file  Lifestyle  . Physical activity:    Days per week: Not on file    Minutes per session: Not on file  . Stress: Not on file  Relationships  . Social connections:    Talks on phone: Not on file    Gets together: Not on file    Attends religious service: Not on file    Active member of club or organization: Not on file    Attends meetings of clubs or organizations: Not on file    Relationship status: Not on file  . Intimate partner violence:    Fear of current or ex partner:  Not on file    Emotionally abused: Not on file    Physically abused: Not on file    Forced sexual activity: Not on file  Other Topics Concern  . Not on file  Social History Narrative  . Not on file     Review of Systems neg for any significant sore throat, dysphagia, itching, sneezing, nasal congestion or excess/ purulent secretions, fever, chills, sweats, unintended wt loss, pleuritic or exertional cp, hempoptysis, orthopnea pnd or change in chronic leg swelling. Also denies presyncope, palpitations, heartburn, abdominal pain, nausea, vomiting, diarrhea or change in bowel or urinary habits, dysuria,hematuria, rash,  arthralgias, visual complaints, headache, numbness weakness or ataxia.     Objective:   Physical Exam   Gen. Pleasant, well-nourished, in no distress, normal affect ENT - no palor, icterus , no post nasal drip Neck: No JVD, no thyromegaly, no carotid bruits Lungs: no use of accessory muscles, no dullness to percussion, BL 1/2 dry  rales no rhonchi  Cardiovascular: Rhythm regular, heart sounds  normal, no murmurs, 1+ peripheral edema Abdomen: soft and non-tender, no hepatosplenomegaly, BS normal. Musculoskeletal: No deformities, no cyanosis or clubbing Neuro:  alert, non focal Skin:  Warm, no lesions/ rash        Assessment & Plan:

## 2018-11-11 NOTE — Assessment & Plan Note (Signed)
Remains to be seen but we will see how her oxygen requirements evolve.  Based on her requirements during exercise, we will write her prescription for portable oxygen concentrator. Meanwhile we will try to get her humidifier

## 2018-11-11 NOTE — Addendum Note (Signed)
Addended by: Valerie Salts on: 11/11/2018 04:04 PM   Modules accepted: Orders

## 2018-11-11 NOTE — Patient Instructions (Addendum)
Check sputum culture.  Stay on prednisone 20 mg daily On December 25, decrease to 15 mg daily  Stay on 20 mg of Lasix daily  Based on your oxygen requirements during cardiac rehab/exercise, we will decide upon getting you a portable oxygen concentrator  We discussed medications for pulmonary fibrosis including OFEV & perfenidone

## 2018-11-12 ENCOUNTER — Telehealth: Payer: Self-pay | Admitting: Pulmonary Disease

## 2018-11-12 MED ORDER — PREDNISONE 10 MG PO TABS: 20.0000 mg | ORAL_TABLET | Freq: Every day | ORAL | 0 refills | Status: DC

## 2018-11-12 NOTE — Telephone Encounter (Signed)
Called and spoke with patient, she stated that her pharmacy [facility] stated that they needed clarification on the prednisone prescription. Called and spoke with pharmacy they stated that the signature on the patients prednisone did not make sense advised that per RA avs note she is to be on 20 mg of prednisone until December 25th then she is to decrease the medication to 15 mg daily. Pharmacist aware. New prescription sent in. Nothing further needed.

## 2018-11-13 DIAGNOSIS — J45909 Unspecified asthma, uncomplicated: Secondary | ICD-10-CM | POA: Diagnosis not present

## 2018-11-13 DIAGNOSIS — J9611 Chronic respiratory failure with hypoxia: Secondary | ICD-10-CM | POA: Diagnosis not present

## 2018-11-13 DIAGNOSIS — I700 Atherosclerosis of aorta: Secondary | ICD-10-CM | POA: Diagnosis not present

## 2018-11-13 DIAGNOSIS — J479 Bronchiectasis, uncomplicated: Secondary | ICD-10-CM | POA: Diagnosis not present

## 2018-11-13 DIAGNOSIS — I480 Paroxysmal atrial fibrillation: Secondary | ICD-10-CM | POA: Diagnosis not present

## 2018-11-13 DIAGNOSIS — J84112 Idiopathic pulmonary fibrosis: Secondary | ICD-10-CM | POA: Diagnosis not present

## 2018-11-13 DIAGNOSIS — Z48812 Encounter for surgical aftercare following surgery on the circulatory system: Secondary | ICD-10-CM | POA: Diagnosis not present

## 2018-11-13 DIAGNOSIS — I10 Essential (primary) hypertension: Secondary | ICD-10-CM | POA: Diagnosis not present

## 2018-11-13 DIAGNOSIS — I214 Non-ST elevation (NSTEMI) myocardial infarction: Secondary | ICD-10-CM | POA: Diagnosis not present

## 2018-11-13 DIAGNOSIS — I2510 Atherosclerotic heart disease of native coronary artery without angina pectoris: Secondary | ICD-10-CM | POA: Diagnosis not present

## 2018-11-13 LAB — ACID FAST CULTURE WITH REFLEXED SENSITIVITIES (MYCOBACTERIA): Acid Fast Culture: NEGATIVE

## 2018-11-13 NOTE — Telephone Encounter (Signed)
Called patient to see if she was interested in participating in the Cardiac Rehab Program. Patient stated yes. Patient will come in for orientation on 12/31/2018 @ 830AM and will attend the 245PM exercise class.  Mailed homework package.

## 2018-11-14 ENCOUNTER — Telehealth: Payer: Self-pay | Admitting: Pulmonary Disease

## 2018-11-14 ENCOUNTER — Encounter: Payer: Self-pay | Admitting: Cardiothoracic Surgery

## 2018-11-14 ENCOUNTER — Other Ambulatory Visit: Payer: Self-pay | Admitting: Internal Medicine

## 2018-11-14 ENCOUNTER — Other Ambulatory Visit: Payer: Self-pay

## 2018-11-14 ENCOUNTER — Other Ambulatory Visit: Payer: Self-pay | Admitting: Cardiothoracic Surgery

## 2018-11-14 ENCOUNTER — Ambulatory Visit (INDEPENDENT_AMBULATORY_CARE_PROVIDER_SITE_OTHER): Payer: Self-pay | Admitting: Cardiothoracic Surgery

## 2018-11-14 ENCOUNTER — Ambulatory Visit
Admission: RE | Admit: 2018-11-14 | Discharge: 2018-11-14 | Disposition: A | Discharge status: Home / Self Care | Payer: 59 | Source: Ambulatory Visit | Attending: Cardiothoracic Surgery | Admitting: Cardiothoracic Surgery

## 2018-11-14 VITALS — BP 102/74 | HR 94 | Resp 18 | Ht 66.0 in | Wt 122.0 lb

## 2018-11-14 DIAGNOSIS — Z951 Presence of aortocoronary bypass graft: Secondary | ICD-10-CM

## 2018-11-14 DIAGNOSIS — G8918 Other acute postprocedural pain: Secondary | ICD-10-CM

## 2018-11-14 DIAGNOSIS — R0602 Shortness of breath: Secondary | ICD-10-CM | POA: Diagnosis not present

## 2018-11-14 MED ORDER — TRAMADOL HCL 50 MG PO TABS: 50.0000 mg | ORAL_TABLET | Freq: Four times a day (QID) | ORAL | 0 refills | Status: AC | PRN

## 2018-11-14 NOTE — Progress Notes (Signed)
[address]       [place] [address]             [phone]      Mystery S Luckey Stephen Medical Record #[id number] Date of Birth: 1956/10/21  Referring: Nelva Bush, MD Primary Care: Cassandria Anger,   Chief Complaint:   POST OP FOLLOW UP DATE OF PROCEDURE:  09/30/2018  PREOPERATIVE DIAGNOSES:   1.  Coronary occlusive disease with recent non-STEMI myocardial infarction.   2.  Chronic lung disease.  POSTOPERATIVE DIAGNOSES:   1.  Coronary occlusive disease with recent non-STEMI myocardial infarction.   2.  Chronic lung disease.  SURGICAL PROCEDURE:  Coronary artery bypass grafting x3 with the left internal mammary to the left anterior descending coronary artery, reverse saphenous vein graft to the diagonal coronary artery, reverse saphenous vein graft to the distal right  coronary artery with bilateral lower leg open left lung biopsy, wedge resection.  SURGEON:  Lanelle Bal, MD  History of Present Illness:     Patient returns to the office for follow-up visit.  She underwent coronary artery bypass grafting September 30, 2018.  Prior to surgery she had significant underlying interstitial lung disease, significantly interfered with her postoperative course with severe respiratory failure, in addition to significant amount of postoperative atrial fib which was difficult to control.  She was discharged home on home oxygen and remains on oxygen.  She notes significant dyspnea with exertion, but has been able to increase her activity to some level.  Home physical therapy is discontinued visiting her in the home.      Past Medical History:  Diagnosis Date  . Allergy   . Asthma    adult onset  . Depression   . Hyperlipidemia    LDL goal = < 100  . Hypertension   . Osteopenia    mild  . Personal history of adenomatous colonic polyps/FHx colon cancer sister and father    [name]  . Sciatica of left side    intermittent      Social History   Tobacco Use  Smoking Status Former Smoker  . Packs/day: 1.00  . Years: 30.00  . Pack years: 30.00  . Types: Cigarettes  . Last attempt to quit: 12/04/2001  . Years since quitting: 16.9  Smokeless Tobacco Never Used  Tobacco Comment    smoked 1973-2003, up to 1.5 ppd    Social History   Substance and Sexual Activity  Alcohol Use Yes  . Alcohol/week: 0.0 standard drinks   Comment: occasionally     Allergies  Allergen Reactions  . Flagyl [Metronidazole] Other (See Comments)    Nerve pain  . Levofloxacin Rash  . Loperamide Hcl Rash  . Neomycin-Bacitracin Zn-Polymyx Rash  . Ofloxacin Rash  . Sulfonamide Derivatives Hives  . Band-Aid Liquid Bandage [New Skin] Other (See Comments)    Regular band-aid cause itching and redness  . Collodion Dermatitis  . Gabapentin Other (See Comments)    Mental status changes  . Latex Other (See Comments) and Dermatitis    "redness"  . Sulfa Antibiotics Hives  . Oxycontin [Oxycodone Hcl] Rash         Current Outpatient Medications  Medication Sig Dispense Refill  . Biotin 5000 MCG CAPS Take 5,000 mcg by mouth daily.     Marland Kitchen buPROPion (WELLBUTRIN XL) 300 MG 24 hr tablet Take 1 tablet (300 mg total) by mouth every morning. Must keep upcoming appt to get refills  90 tablet 0  . Cholecalciferol 2000 units TABS Take 2,000 Units by mouth daily.     . digoxin (LANOXIN) 0.125 MG tablet Take 1 tablet (0.125 mg total) by mouth daily. 30 tablet 3  . estrogen-methylTESTOSTERone 0.625-1.25 MG tablet Take 1 tablet by mouth daily.    . feeding supplement, ENSURE ENLIVE, (ENSURE ENLIVE) LIQD Take 237 mLs by mouth 2 (two) times daily between meals. (Patient taking differently: Take 237 mLs by mouth 3 (three) times a week. ) 237 mL 12  . fexofenadine (ALLEGRA) 180 MG tablet Take 180 mg by mouth daily.    . finasteride (PROSCAR) 5 MG tablet Take 2.5 mg by mouth daily.     . Flaxseed, Linseed, (FLAXSEED OIL) 1000 MG CAPS Take 1,000 mg  by mouth 2 (two) times daily.     . fluticasone (FLONASE) 50 MCG/ACT nasal spray USE 1 SPRAY IN EACH NOSTRIL TWO TIMES A DAY AS NEEDED (Patient taking differently: Place 1 spray into both nostrils 2 (two) times daily as needed for allergies. ) 48 g 3  . fluticasone furoate-vilanterol (BREO ELLIPTA) 100-25 MCG/INH AEPB USE 1 INHALATION DAILY (Patient taking differently: Inhale 1 puff into the lungs daily. ) 180 each 3  . furosemide (LASIX) 20 MG tablet Take 1 tablet (20 mg total) by mouth daily. 30 tablet 0  . guaiFENesin (MUCINEX) 600 MG 12 hr tablet Take 2 tablets (1,200 mg total) by mouth 2 (two) times daily. With full glass of water 60 tablet 0  . Ipratropium-Albuterol (COMBIVENT RESPIMAT) 20-100 MCG/ACT AERS respimat Inhale 1 puff into the lungs 4 (four) times daily as needed for wheezing. 3 Inhaler 3  . ipratropium-albuterol (DUONEB) 0.5-2.5 (3) MG/3ML SOLN Take 3 mLs by nebulization every 8 (eight) hours. 360 mL 2  . levalbuterol (XOPENEX) 1.25 MG/0.5ML nebulizer solution Take 1.25 mg by nebulization every 6 (six) hours as needed for wheezing or shortness of breath. 30 each 1  . LORazepam (ATIVAN) 0.5 MG tablet Take 1-2 tablets (0.5-1 mg total) by mouth at bedtime as needed for anxiety. Patient needs office visit before refills will be given (Patient taking differently: Take 0.5 mg by mouth at bedtime as needed for anxiety. Patient needs office visit before refills will be given) 60 tablet 2  . metoprolol tartrate (LOPRESSOR) 25 MG tablet Take 1 tablet (25 mg total) by mouth 2 (two) times daily. 60 tablet 3  . minoxidil (ROGAINE) 2 % external solution Apply 1 application topically as needed (for hair loss).     . Multiple Vitamins-Minerals (ONE-A-DAY 50 PLUS PO) Take 1 tablet by mouth daily.     . potassium chloride SA (K-DUR,KLOR-CON) 10 MEQ tablet Take 1 tablet (10 mEq total) by mouth daily. 30 tablet 3  . predniSONE (DELTASONE) 10 MG tablet Take 2 tablets (20 mg total) by mouth daily with  breakfast. 20 mg daily until December 25th, then 15mg  daily. 60 tablet 0  . Respiratory Therapy Supplies (FLUTTER) DEVI Use a directed. 1 each 0  . simvastatin (ZOCOR) 20 MG tablet TAKE 1 TABLET BY MOUTH  DAILY AT 6 PM. (Patient taking differently: Take 20 mg by mouth at bedtime. ) 90 tablet 3  . traMADol (ULTRAM) 50 MG tablet Take 1-2 tablets (50-100 mg total) by mouth every 6 (six) hours as needed for moderate pain. 28 tablet 0   No current facility-administered medications for this visit.        Physical Exam: BP 102/74 (BP Location: Right Arm, Patient Position: Sitting, Cuff Size: Normal)  Pulse 94   Resp 18   Ht 5\' 6"  (1.676 m)   Wt 122 lb (55.3 kg)   SpO2 91% Comment: RA  BMI 19.69 kg/m   General appearance: alert, cooperative, appears older than stated age and mild distress Neurologic: intact Heart: regular rate and rhythm, S1, S2 normal, no murmur, click, rub or gallop Lungs: diminished breath sounds bibasilar Abdomen: soft, non-tender; bowel sounds normal; no masses,  no organomegaly Extremities: extremities normal, atraumatic, no cyanosis or edema and Homans sign is negative, no sign of DVT Wound: Sternal incision is healing bilateral lower leg vein sites are also healing   Diagnostic Studies & Laboratory data:     Recent Radiology Findings:   Dg Chest 2 View  Result Date: 11/14/2018 CLINICAL DATA:  Severe shortness of breath today. EXAM: CHEST - 2 VIEW COMPARISON:  10/19/2018 FINDINGS: The cardiac silhouette, mediastinal and hilar contours are within normal limits and stable. Stable surgical changes from coronary artery bypass surgery. Severe chronic lung disease is again demonstrated. There appears to be an overlying airspace process with overall slight increased density of the lungs compared to the prior study. This is most notable at the left lung base. No definite pleural effusions or pneumothorax. The bony thorax is intact. IMPRESSION: Severe underlying lung  disease with suspected superimposed acute airspace process such as bronchopneumonia. Electronically Signed   By: Marijo Sanes M.D.   On: 11/14/2018 14:29   I have independently reviewed the above radiology studies  and reviewed the findings with the patient. On my review of the patient's film the current film looks improved compared to the others, there is severe underlying interstitial lung disease, she did not appear to have clinical bronchopneumonia at this point.  Recent Lab Findings: Lab Results  Component Value Date   WBC 17.5 (H) 10/18/2018   HGB 11.2 (L) 10/18/2018   HCT 34.2 (L) 10/18/2018   PLT 391 10/18/2018   GLUCOSE 89 10/18/2018   CHOL 120 09/27/2018   TRIG 68 09/27/2018   HDL 36 (L) 09/27/2018   LDLCALC 70 09/27/2018   ALT 120 (H) 10/18/2018   AST 32 10/18/2018   NA 128 (L) 10/18/2018   K 4.3 10/18/2018   CL 93 (L) 10/18/2018   CREATININE 0.81 10/18/2018   BUN 19 10/18/2018   CO2 29 10/18/2018   TSH 1.676 09/30/2018   INR 1.43 09/30/2018   HGBA1C 5.3 09/30/2018      Assessment / Plan:      Patient status post coronary artery bypass grafting with significant underlying interstitial lung disease which is limited her recovery She continues to be followed by the pulmonary service and is on home oxygen and steroids We have referred her to pulmonary rehab Continue with the current treatment regimen as outlined by pulmonary and cardiology services. Plan to see her back in 3 weeks with a follow-up chest x-ray   [name] MD      [address] [place] [address] Office [phone]   [facility]eeper [phone]  11/14/2018 3:46 PM

## 2018-11-14 NOTE — Addendum Note (Signed)
Addended by: Charlena Cross F on: 11/14/2018 04:10 PM   Modules accepted: Orders

## 2018-11-14 NOTE — Telephone Encounter (Signed)
Copied from [place] [id number]. Topic: Quick Communication - Rx Refill/Question >> Nov 14, 2018 10:50 AM Judyann Munson wrote: Medication: albuterol-ipratropium (COMBIVENT) 18-103 MCG/ACT inhaler  Has the patient contacted their pharmacy? No   Preferred Pharmacy (with phone number or street name): [facility], [place], [place]: Please be advised that RX refills may take up to 3 business days. We ask that you follow-up with your pharmacy.

## 2018-11-14 NOTE — Telephone Encounter (Signed)
Contacted pt to verify pharmacy; she states that it is [facility],  [phone]; Wannetta Sender at [employer] that the address is [place] [address] (electronic) and [address] [place]  [address] (print) Fax [phone].

## 2018-11-14 NOTE — Telephone Encounter (Signed)
Spoke with pt, she wanted to clarify the prednisone dosage. I gave her the instructions below from Dr. Elsworth Soho. Pt understood and nothing further is needed.   [facility], [place]      11/12/18 11:34 AM  Note    Called and spoke with patient, she stated that her pharmacy [facility] stated that they needed clarification on the prednisone prescription. Called and spoke with pharmacy they stated that the signature on the patients prednisone did not make sense advised that per RA avs note she is to be on 20 mg of prednisone until December 25th then she is to decrease the medication to 15 mg daily. Pharmacist aware. New prescription sent in. Nothing further needed.

## 2018-11-15 MED ORDER — IPRATROPIUM-ALBUTEROL 20-100 MCG/ACT IN AERS: 1.0000 | INHALATION_SPRAY | Freq: Four times a day (QID) | RESPIRATORY_TRACT | 0 refills | Status: DC | PRN

## 2018-11-15 NOTE — Telephone Encounter (Signed)
Requested medication (s) are due for refill today: ?  Requested medication (s) are on the active medication list: yes  Last refill:  04/27/17  Future visit scheduled: no  Notes to clinic:  Pt seen by pulmonary; last seen in office 04/27/17    Requested Prescriptions  Pending Prescriptions Disp Refills   Ipratropium-Albuterol (COMBIVENT RESPIMAT) 20-100 MCG/ACT AERS respimat 3 Inhaler 3    Sig: Inhale 1 puff into the lungs 4 (four) times daily as needed for wheezing.     Pulmonology:  Combination Products Failed - 11/14/2018 12:59 PM      Failed - Valid encounter within last 12 months    Recent Outpatient Visits          1 year ago Need for pneumococcal vaccination   [facility], Evie Lacks, MD   1 year ago Essential hypertension   [facility], Evie Lacks, MD   1 year ago Well adult exam   [facility], Evie Lacks, MD   2 years ago Asthmatic bronchitis, mild intermittent, uncomplicated   [place], Evie Lacks, MD   2 years ago Skin irritation   [place] [facility], Velora Heckler, RN      Future Appointments            In 1 month Elsworth Soho, Leanna Sato, MD [facility]

## 2018-11-18 ENCOUNTER — Other Ambulatory Visit: Payer: 59

## 2018-11-18 ENCOUNTER — Telehealth: Payer: Self-pay | Admitting: Pulmonary Disease

## 2018-11-18 DIAGNOSIS — J189 Pneumonia, unspecified organism: Secondary | ICD-10-CM | POA: Diagnosis not present

## 2018-11-18 DIAGNOSIS — J849 Interstitial pulmonary disease, unspecified: Secondary | ICD-10-CM

## 2018-11-18 MED ORDER — FUROSEMIDE 20 MG PO TABS: 20.0000 mg | ORAL_TABLET | Freq: Every day | ORAL | 0 refills | Status: DC

## 2018-11-18 MED ORDER — DOXYCYCLINE HYCLATE 100 MG PO TABS: 100.0000 mg | ORAL_TABLET | Freq: Two times a day (BID) | ORAL | 0 refills | Status: DC

## 2018-11-18 NOTE — Telephone Encounter (Signed)
Called and spoke with patient she is aware and verbalized understanding. Order placed. Nothing further needed.

## 2018-11-18 NOTE — Telephone Encounter (Signed)
Looked at pt's lasix Rx to see who filled it last for her and when looking at Rx, saw that Barrett, Lodema Hong, PA-C was the one who last ordered the refill of med for pt on 10/24/18.  Called pt who stated that she is needing a refill of the lasix as she only has a couple days left of med. Pt stated she does not know if this was prescribed for her by someone at the hospital or whom, and I stated to her the name that was on the Rx but not sure if they are from the hospital or what but pt is wanting to know if RA will okay a refill of the lasix.  Pt is also requesting an abx now. Pt stated at last OV with RA, she did not want an abx at that time but states she now wants one to be called in for her.  Dr. Elsworth Soho, please advise on this for pt. Thanks!

## 2018-11-18 NOTE — Telephone Encounter (Signed)
Called and spoke with pt letting her know that RA stated we could refill lasix x1. Stated to her that RA stated we needed sputum culture to be done and after that she could take abx.  Pt stated she did do sputum today, 11/18/18 and husband brought specimen to lab. Dr. Elsworth Soho, do you need to have the results to come back on culture before pt begins doxy or can we go ahead and send it in now for her to start taking now?

## 2018-11-18 NOTE — Telephone Encounter (Signed)
Okay to send doxycycline now

## 2018-11-18 NOTE — Telephone Encounter (Signed)
Called patient unable to reach left message to give us a call back.

## 2018-11-18 NOTE — Telephone Encounter (Signed)
Lasix was prescribed by [facility] -but okay to refill x1 She needs sputum culture Then can take doxycycline 100 twice daily x 7 days

## 2018-11-18 NOTE — Telephone Encounter (Signed)
Patient returned call, CB is [phone].

## 2018-11-19 ENCOUNTER — Other Ambulatory Visit: Payer: Self-pay | Admitting: Internal Medicine

## 2018-11-21 ENCOUNTER — Other Ambulatory Visit: Payer: Self-pay | Admitting: Internal Medicine

## 2018-11-21 MED ORDER — IPRATROPIUM-ALBUTEROL 20-100 MCG/ACT IN AERS: 1.0000 | INHALATION_SPRAY | Freq: Four times a day (QID) | RESPIRATORY_TRACT | 0 refills | Status: DC | PRN

## 2018-11-21 NOTE — Telephone Encounter (Signed)
Copied from [place] [id number]. Topic: Quick Communication - See Telephone Encounter >> Nov 21, 2018  9:57 AM Bea Graff, NT wrote: CRM for notification. See Telephone encounter for: 11/21/18. Pt states that the Ipratropium-Albuterol (COMBIVENT RESPIMAT) 20-100 MCG/ACT AERS respimat was only sent in for 30 days and [facility] stated it will be cheaper if sent in for 90 days. Pt requesting the rx to be resent but for 90 days.

## 2018-11-22 ENCOUNTER — Other Ambulatory Visit: Payer: Self-pay | Admitting: Cardiothoracic Surgery

## 2018-11-22 DIAGNOSIS — J9621 Acute and chronic respiratory failure with hypoxia: Secondary | ICD-10-CM | POA: Diagnosis not present

## 2018-11-22 DIAGNOSIS — R0602 Shortness of breath: Secondary | ICD-10-CM | POA: Diagnosis not present

## 2018-11-22 DIAGNOSIS — Z951 Presence of aortocoronary bypass graft: Secondary | ICD-10-CM

## 2018-11-22 DIAGNOSIS — J9611 Chronic respiratory failure with hypoxia: Secondary | ICD-10-CM | POA: Diagnosis not present

## 2018-11-28 ENCOUNTER — Ambulatory Visit (INDEPENDENT_AMBULATORY_CARE_PROVIDER_SITE_OTHER): Payer: Self-pay | Admitting: Cardiothoracic Surgery

## 2018-11-28 ENCOUNTER — Ambulatory Visit
Admission: RE | Admit: 2018-11-28 | Discharge: 2018-11-28 | Disposition: A | Discharge status: Home / Self Care | Payer: 59 | Source: Ambulatory Visit | Attending: Cardiothoracic Surgery | Admitting: Cardiothoracic Surgery

## 2018-11-28 VITALS — BP 100/64 | HR 83 | Resp 20 | Ht 66.0 in | Wt 121.0 lb

## 2018-11-28 DIAGNOSIS — Z951 Presence of aortocoronary bypass graft: Secondary | ICD-10-CM

## 2018-11-28 DIAGNOSIS — I2510 Atherosclerotic heart disease of native coronary artery without angina pectoris: Secondary | ICD-10-CM

## 2018-11-28 DIAGNOSIS — J849 Interstitial pulmonary disease, unspecified: Secondary | ICD-10-CM

## 2018-11-28 DIAGNOSIS — J8410 Pulmonary fibrosis, unspecified: Secondary | ICD-10-CM | POA: Diagnosis not present

## 2018-11-28 NOTE — Progress Notes (Signed)
Sarah Escobar       [place] [address]             [id number]      [facility] Medical Record #[id number] Date of Birth: 1956/06/25  Referring: Nelva Bush, MD Primary Care: Cassandria Anger,   Chief Complaint:   POST OP FOLLOW UP DATE OF PROCEDURE:  09/30/2018  PREOPERATIVE DIAGNOSES:   1.  Coronary occlusive disease with recent non-STEMI myocardial infarction.   2.  Chronic lung disease.  POSTOPERATIVE DIAGNOSES:   1.  Coronary occlusive disease with recent non-STEMI myocardial infarction.   2.  Chronic lung disease.  SURGICAL PROCEDURE:  Coronary artery bypass grafting x3 with the left internal mammary to the left anterior descending coronary artery, reverse saphenous vein graft to the diagonal coronary artery, reverse saphenous vein graft to the distal right  coronary artery with bilateral lower leg open left lung biopsy, wedge resection.  SURGEON:  Lanelle Bal, MD  History of Present Illness:     Patient returns to the office for follow-up visit.  She underwent coronary artery bypass grafting September 30, 2018.  Prior to surgery she had significant underlying interstitial lung disease, significantly interfered with her postoperative course with severe respiratory failure, in addition to significant amount of postoperative atrial fib which was difficult to control.  She was discharged home on home oxygen and remains on oxygen.  She continues to be severely limited by her respiratory failure, she was given a course of doxycycline by the pulmonary service which she finished yesterday.  She denies fevers or chills, still has some sputum production    Past Medical History:  Diagnosis Date  . Allergy   . Asthma    adult onset  . Depression   . Hyperlipidemia    LDL goal = < 100  . Hypertension   . Osteopenia    mild  . Personal history of adenomatous colonic polyps/FHx colon cancer sister and father    [name]  .  Sciatica of left side    intermittent     Social History   Tobacco Use  Smoking Status Former Smoker  . Packs/day: 1.00  . Years: 30.00  . Pack years: 30.00  . Types: Cigarettes  . Last attempt to quit: 12/04/2001  . Years since quitting: 16.9  Smokeless Tobacco Never Used  Tobacco Comment    smoked 1973-2003, up to 1.5 ppd    Social History   Substance and Sexual Activity  Alcohol Use Yes  . Alcohol/week: 0.0 standard drinks   Comment: occasionally     Allergies  Allergen Reactions  . Flagyl [Metronidazole] Other (See Comments)    Nerve pain  . Levofloxacin Rash  . Loperamide Hcl Rash  . Neomycin-Bacitracin Zn-Polymyx Rash  . Ofloxacin Rash  . Sulfonamide Derivatives Hives  . Band-Aid Liquid Bandage [New Skin] Other (See Comments)    Regular band-aid cause itching and redness  . Collodion Dermatitis  . Gabapentin Other (See Comments)    Mental status changes  . Latex Other (See Comments) and Dermatitis    "redness"  . Sulfa Antibiotics Hives  . Oxycontin [Oxycodone Hcl] Rash         Current Outpatient Medications  Medication Sig Dispense Refill  . Biotin 5000 MCG CAPS Take 5,000 mcg by mouth daily.     Marland Kitchen buPROPion (WELLBUTRIN XL) 300 MG 24 hr tablet Take 1 tablet (300 mg total) by mouth every morning. Must  keep upcoming appt to get refills 90 tablet 0  . Cholecalciferol 2000 units TABS Take 2,000 Units by mouth daily.     . digoxin (LANOXIN) 0.125 MG tablet Take 1 tablet (0.125 mg total) by mouth daily. 30 tablet 3  . doxycycline (VIBRA-TABS) 100 MG tablet Take 1 tablet (100 mg total) by mouth 2 (two) times daily. 14 tablet 0  . estrogen-methylTESTOSTERone 0.625-1.25 MG tablet Take 1 tablet by mouth daily.    . feeding supplement, ENSURE ENLIVE, (ENSURE ENLIVE) LIQD Take 237 mLs by mouth 2 (two) times daily between meals. (Patient taking differently: Take 237 mLs by mouth 3 (three) times a week. ) 237 mL 12  . fexofenadine (ALLEGRA) 180 MG tablet Take 180 mg  by mouth daily.    . finasteride (PROSCAR) 5 MG tablet Take 2.5 mg by mouth daily.     . Flaxseed, Linseed, (FLAXSEED OIL) 1000 MG CAPS Take 1,000 mg by mouth 2 (two) times daily.     . fluticasone (FLONASE) 50 MCG/ACT nasal spray USE 1 SPRAY IN EACH NOSTRIL TWO TIMES A DAY AS NEEDED (Patient taking differently: Place 1 spray into both nostrils 2 (two) times daily as needed for allergies. ) 48 g 3  . fluticasone furoate-vilanterol (BREO ELLIPTA) 100-25 MCG/INH AEPB USE 1 INHALATION DAILY (Patient taking differently: Inhale 1 puff into the lungs daily. ) 180 each 3  . furosemide (LASIX) 20 MG tablet Take 1 tablet (20 mg total) by mouth daily. 30 tablet 0  . guaiFENesin (MUCINEX) 600 MG 12 hr tablet Take 2 tablets (1,200 mg total) by mouth 2 (two) times daily. With full glass of water 60 tablet 0  . Ipratropium-Albuterol (COMBIVENT RESPIMAT) 20-100 MCG/ACT AERS respimat Inhale 1 puff into the lungs 4 (four) times daily as needed for wheezing. Overdue for annual appt must see provider for refills 3 Inhaler 0  . ipratropium-albuterol (DUONEB) 0.5-2.5 (3) MG/3ML SOLN Take 3 mLs by nebulization every 8 (eight) hours. 360 mL 2  . levalbuterol (XOPENEX) 1.25 MG/0.5ML nebulizer solution Take 1.25 mg by nebulization every 6 (six) hours as needed for wheezing or shortness of breath. 30 each 1  . LORazepam (ATIVAN) 0.5 MG tablet Take 1-2 tablets (0.5-1 mg total) by mouth at bedtime as needed for anxiety. Patient needs office visit before refills will be given (Patient taking differently: Take 0.5 mg by mouth at bedtime as needed for anxiety. Patient needs office visit before refills will be given) 60 tablet 2  . metoprolol tartrate (LOPRESSOR) 25 MG tablet Take 1 tablet (25 mg total) by mouth 2 (two) times daily. 60 tablet 3  . minoxidil (ROGAINE) 2 % external solution Apply 1 application topically as needed (for hair loss).     . Multiple Vitamins-Minerals (ONE-A-DAY 50 PLUS PO) Take 1 tablet by mouth daily.       . potassium chloride SA (K-DUR,KLOR-CON) 10 MEQ tablet Take 1 tablet (10 mEq total) by mouth daily. 30 tablet 3  . predniSONE (DELTASONE) 10 MG tablet Take 2 tablets (20 mg total) by mouth daily with breakfast. 20 mg daily until December 25th, then 15mg  daily. 60 tablet 0  . Respiratory Therapy Supplies (FLUTTER) DEVI Use a directed. 1 each 0  . simvastatin (ZOCOR) 20 MG tablet TAKE 1 TABLET BY MOUTH  DAILY AT 6 PM. (Patient taking differently: Take 20 mg by mouth at bedtime. ) 90 tablet 3  . traMADol (ULTRAM) 50 MG tablet Take 1-2 tablets (50-100 mg total) by mouth every 6 (six) hours  as needed for moderate pain. 28 tablet 0   No current facility-administered medications for this visit.        Physical Exam: BP 100/64   Pulse 83   Resp 20   Ht 5\' 6"  (1.676 m)   Wt 121 lb (54.9 kg)   SpO2 96% Comment: 6L O2per [place]  BMI 19.53 kg/m  General appearance: alert, cooperative, appears older than stated age and mild distress Head: Normocephalic, without obvious abnormality, atraumatic Resp: diminished breath sounds bilaterally Cardio: regular rate and rhythm, S1, S2 normal, no murmur, click, rub or gallop Extremities: extremities normal, atraumatic, no cyanosis or edema Neurologic: Grossly normal Patient remains on oxygen, dyspneic at rest   Diagnostic Studies & Laboratory data:     Recent Radiology Findings:   Dg Chest 2 View  Result Date: 11/28/2018 CLINICAL DATA:  Shortness of breath and hypertension EXAM: CHEST - 2 VIEW COMPARISON:  November 14, 2018 and October 01, 2018; March 04, 2018 FINDINGS: There is extensive underlying interstitial fibrosis. There is no appreciable airspace consolidation. There may be a degree of interstitial edema superimposed on fibrosis. There is chronic atelectatic change and scarring in the lung bases. Heart is upper normal in size with pulmonary vascularity normal. No adenopathy. Status post coronary artery bypass grafting. Postoperative change noted in  the lower cervical spine. There is aortic atherosclerosis. IMPRESSION: Extensive pulmonary fibrosis. A degree of interstitial edema superimposed can not be excluded, although well-defined edema is not seen with certainty. There is chronic scarring and atelectasis in the bases. There is no frank airspace consolidation. Heart is upper normal in size with pulmonary vascularity normal. Postoperative changes noted. No evident adenopathy. There is aortic atherosclerosis. Aortic Atherosclerosis (ICD10-I70.0). Electronically Signed   By: Lowella Grip III M.D.   On: 11/28/2018 13:50   I have independently reviewed the above radiology studies  and reviewed the findings with the patient.     Recent Lab Findings: Lab Results  Component Value Date   WBC 17.5 (H) 10/18/2018   HGB 11.2 (L) 10/18/2018   HCT 34.2 (L) 10/18/2018   PLT 391 10/18/2018   GLUCOSE 89 10/18/2018   CHOL 120 09/27/2018   TRIG 68 09/27/2018   HDL 36 (L) 09/27/2018   LDLCALC 70 09/27/2018   ALT 120 (H) 10/18/2018   AST 32 10/18/2018   NA 128 (L) 10/18/2018   K 4.3 10/18/2018   CL 93 (L) 10/18/2018   CREATININE 0.81 10/18/2018   BUN 19 10/18/2018   CO2 29 10/18/2018   TSH 1.676 09/30/2018   INR 1.43 09/30/2018   HGBA1C 5.3 09/30/2018      Assessment / Plan:      Patient status post coronary artery bypass grafting with significant underlying interstitial lung disease which is limited her recovery She continues to be followed by the pulmonary service and is on home oxygen and steroids, he has completed a course of p.o. antibiotics We have referred her to pulmonary rehab-she has not heard from them we will resubmit referral Continue with the current treatment regimen as outlined by pulmonary and cardiology services. Plan to see back in 4 weeks with a chest x-ray   [name] MD      [address] [place] [address] Office [phone]   Beeper [phone]  11/28/2018 2:45  PM

## 2018-11-29 ENCOUNTER — Telehealth: Payer: Self-pay | Admitting: Pulmonary Disease

## 2018-11-29 NOTE — Telephone Encounter (Signed)
Called and spoke with patient, she has been schedule to see RA. Nothing further needed.

## 2018-11-29 NOTE — Telephone Encounter (Signed)
Called and spoke with pt who stated she finished her doxy abx about 1 week ago and stated that she is still taking prednisone and states she is at 15mg .  Pt stated her breathing became worse x3 days ago and also has head and chest congestion and is also coughing up green and yellow mucus.  Pt denies any fever. Pt states that she does have some mild chest pain. Pt stated a cxr was performed yesterday by Dr. Servando Snare which is able to be seen in pt's epic chart.  At this time, pt does not want to come in for an appt but would like for another round of abx to be prescribed and wants to know if the prednisone could be increased again.  Aaron Edelman, please advise on this for pt. Thanks!

## 2018-11-29 NOTE — Telephone Encounter (Signed)
Pt will need ov for further evaluation.  Please schedule with Dr. Elsworth Soho or an APP next week.  Patient with interstitial lung disease, may need anti-fibrotic therapies, patient needs further evaluation and assessment.  Maintain prednisone as prescribed.  If symptoms are not improving patient to present to the emergency room for further evaluation.  Wyn Quaker, FNP

## 2018-12-02 ENCOUNTER — Other Ambulatory Visit: Payer: Self-pay | Admitting: Physician Assistant

## 2018-12-02 ENCOUNTER — Other Ambulatory Visit: Payer: Self-pay

## 2018-12-02 DIAGNOSIS — J849 Interstitial pulmonary disease, unspecified: Secondary | ICD-10-CM

## 2018-12-02 DIAGNOSIS — Z951 Presence of aortocoronary bypass graft: Secondary | ICD-10-CM

## 2018-12-05 ENCOUNTER — Encounter: Payer: Self-pay | Admitting: Pulmonary Disease

## 2018-12-05 ENCOUNTER — Ambulatory Visit (INDEPENDENT_AMBULATORY_CARE_PROVIDER_SITE_OTHER): Payer: 59 | Admitting: Pulmonary Disease

## 2018-12-05 DIAGNOSIS — J849 Interstitial pulmonary disease, unspecified: Secondary | ICD-10-CM

## 2018-12-05 DIAGNOSIS — J9611 Chronic respiratory failure with hypoxia: Secondary | ICD-10-CM | POA: Diagnosis not present

## 2018-12-05 DIAGNOSIS — J189 Pneumonia, unspecified organism: Secondary | ICD-10-CM

## 2018-12-05 LAB — HEPATIC FUNCTION PANEL
ALK PHOS: 117 U/L (ref 39–117)
ALT: 70 U/L — ABNORMAL HIGH (ref 0–35)
AST: 35 U/L (ref 0–37)
Albumin: 3.7 g/dL (ref 3.5–5.2)
BILIRUBIN DIRECT: 0.1 mg/dL (ref 0.0–0.3)
Total Bilirubin: 0.3 mg/dL (ref 0.2–1.2)
Total Protein: 6.6 g/dL (ref 6.0–8.3)

## 2018-12-05 NOTE — Progress Notes (Signed)
   Subjective:    Patient ID: Sarah Escobar, female    DOB: 1956/03/04, 63 y.o.   MRN: [id number]  HPI  62yoex-smoker for FU of ILD , asthmatic bronchitis & bronchiectasis, hypersensitive to perfumes. She smoked 1 PPD for about 30-pack-years before quitting in 2003. She hasrepeated sinus infections, had multiple sinus surgeries Other issues include hair loss and yellowing of her nails She underwent CABG & OLBx 09/2018 Postop course complicated by severe hypoxia requiring high flow nasal cannula and  atrial fibrillation.  She responded gradually to high-dose prednisone and by discharge was requiring 4 L nasal cannula.  She was treated for Serratia HCAP with ceftriaxone for 14 days  Pathology from upper lobe biopsy-favor fibrotic NSIP/ chronic HP  clinically but second opinion from [place] was read as UIP   She was given course of doxycycline 12/16.  Based on her last visit she was asked to decrease prednisone from 20 mg to 15 mg around December 25. Lasix was refilled. Sputum culture was never sent unfortunately, sputum AFB was sent which is growing an isolate  She saw Dr. Servando Snare on 12/26, I reviewed chest x-ray which shows bilateral interstitial fibrosis, he felt she was not a candidate for cardiac rehab at this time  Her breathing worsened when she decrease prednisone and hence has increased it back up to 20 mg on at least 2-3 occasions  Significant tests/ events reviewed  PFT 04/2014 - no obstruction, preserved lung volumes, DLCO 76%   10/2016Spirometry >> ratio 90, FEV 81%, FVC 71%  2/2019spirometry showed slight drop in FEV1 to 70 and ratio 88>>moderate restriction 09/2018 spirometry ratio 91, FEV1 71%, FVC 60%  HRCT 02/2014- Nonspecific pattern of interstitial lung disease with bronchiectasis, subpleural reticulation and architectural distortion. There are minor areas of alveolitis in the left upper and lower lobes Serology neg  hypersens panel neg  07/2015  CT sinuses neg 02/2018 HRCT >> slight worse,favor chr hypersens pneumonitis, NOT UIP 09/2018 HRCT favor chronic HP  Past Medical History:  Diagnosis Date  . Allergy   . Asthma    adult onset  . Depression   . Hyperlipidemia    LDL goal = < 100  . Hypertension   . Osteopenia    mild  . Personal history of adenomatous colonic polyps/FHx colon cancer sister and father    [name]  . Sciatica of left side    intermittent     Review of Systems neg for any significant sore throat, dysphagia, itching, sneezing, nasal congestion or excess/ purulent secretions, fever, chills, sweats, unintended wt loss, pleuritic or exertional cp, hempoptysis, orthopnea pnd or change in chronic leg swelling. Also denies presyncope, palpitations, heartburn, abdominal pain, nausea, vomiting, diarrhea or change in bowel or urinary habits, dysuria,hematuria, rash, arthralgias, visual complaints, headache, numbness weakness or ataxia.     Objective:   Physical Exam   Gen. Pleasant, well-nourished, in no distress, anxious affect ENT - no pallor,icterus, no post nasal drip Neck: No JVD, no thyromegaly, no carotid bruits Lungs: no use of accessory muscles, no dullness to percussion, BL 1/2  rales no rhonchi  Cardiovascular: Rhythm regular, heart sounds  normal, no murmurs or gallops, no peripheral edema Abdomen: soft and non-tender, no hepatosplenomegaly, BS normal. Musculoskeletal: No deformities, no cyanosis or clubbing Neuro:  alert, non focal        Assessment & Plan:

## 2018-12-05 NOTE — Addendum Note (Signed)
Addended by: Valerie Salts on: 12/05/2018 10:54 AM   Modules accepted: Orders

## 2018-12-05 NOTE — Assessment & Plan Note (Signed)
Treated for Serratia Sputum AFB growing and isolate which we will clarify

## 2018-12-05 NOTE — Assessment & Plan Note (Signed)
Referral to pulmonary rehab Stay on 3 L at rest and 6 L with exertion until clarified during rehab

## 2018-12-05 NOTE — Addendum Note (Signed)
Addended by: Suzzanne Cloud E on: 12/05/2018 10:49 AM   Modules accepted: Orders

## 2018-12-05 NOTE — Assessment & Plan Note (Addendum)
Referral to [facility]-Dr. Randol Kern for second opinion and transplant referral  We will place paperwork for OFEV 150 twice daily-we discussed side effects of diarrhea and liver toxicity Feel that she has underlying fibrotic ILD and hence will qualify based on recent papers Extensive discussion about side effects  Increase prednisone to 20 mg on Monday/Wednesday/Friday Keep at 15 mg on other days  Greater than 50% time  Of 45 mins was spent in counseling and coordination of care with the patient

## 2018-12-05 NOTE — Patient Instructions (Signed)
Referral to pulmonary rehab Referral to [facility]-Dr. Randol Kern for second opinion.  We will place paperwork for OFEV 150 twice daily-we discussed side effects of diarrhea and liver toxicity  Increase prednisone to 20 mg on Monday/Wednesday/Friday Keep at 15 mg on other days

## 2018-12-06 ENCOUNTER — Telehealth: Payer: Self-pay | Admitting: Pulmonary Disease

## 2018-12-06 DIAGNOSIS — J0101 Acute recurrent maxillary sinusitis: Secondary | ICD-10-CM

## 2018-12-06 MED ORDER — DOXYCYCLINE HYCLATE 100 MG PO TABS: 100.0000 mg | ORAL_TABLET | Freq: Two times a day (BID) | ORAL | 0 refills | Status: DC

## 2018-12-06 NOTE — Telephone Encounter (Signed)
Called and spoke with pt letting her know that Wyn Quaker, NP said we could send in doxy abx to her pharmacy for her. abx was sent to pt's preferred pharmacy.  Stated to her that we may need to do referral to ID if she continues to have flare ups. Stated to her that we would get clarification from Dr. Elsworth Soho if another sputum culture needed to be done as she had done that one 2 months ago.   Pt stated to me she has been referred to [facility] due to her lungs. I stated to her we would also clarify with Dr. Elsworth Soho if a referral to ID needed to be done and also to see if she needed to do another sputum culture.  Dr. Elsworth Soho, please advise on this. Thanks!

## 2018-12-06 NOTE — Telephone Encounter (Signed)
Attempted to contact pt. Call went straight to voicemail. I have left a message for the pt to return our call.

## 2018-12-06 NOTE — Telephone Encounter (Signed)
Can offer patient:Doxycycline >>> 1 100 mg tablet every 12 hours for 7 days >>>take with food  >>>wear sunscreen   We are honestly quite limited due to patient's allergies as far as with managing with antibiotics.  With patient's sputum culture results from 2 months ago we may need to consider infectious disease referral if she continues to have flares.  Please route to Dr. Elsworth Soho as an FYI as the patient needs clarification from what he wants from 12/05/2018 appointment.  Wyn Quaker, FNP

## 2018-12-06 NOTE — Telephone Encounter (Signed)
Left message for patient to call back  

## 2018-12-06 NOTE — Telephone Encounter (Signed)
Can we clarify the patient if the patient has a sputum cup at home that she needs to be producing?  Dr. Bari Mantis note mentions grown out sputum culture.   Wyn Quaker, FNP

## 2018-12-06 NOTE — Telephone Encounter (Signed)
Pt is returning call. Cb is [phone].

## 2018-12-06 NOTE — Telephone Encounter (Signed)
Spoke with pt. I attempted to help the pt with her question but she wants to speak to [place] only.  Will route message to [place].

## 2018-12-06 NOTE — Telephone Encounter (Signed)
Pt already did provide a sputum culture. Checked with Benetta Spar, CMA to see if anything was mentioned at pt's OV in regards to another sample needing to be done and per Cherina, nothing was mentioned at [place] that pt needed to do another sample.

## 2018-12-06 NOTE — Telephone Encounter (Signed)
Spoke with patient. She was seen by RA yesterday for an acute visit due to her continued SOB and congestion. She was advised to increase her prednisone but did not have an antibiotic called in her. She feels that she needs an antibiotic. Increase body aches, congestion with yellow phlegm. Denies having a fever.   She wishes to use [facility] in [place].   Aaron Edelman, since RA is not available, please advise. Thank you!

## 2018-12-06 NOTE — Telephone Encounter (Signed)
Yes she produced one 2 months ago.  Im talking about from last office visit fasting yesterday did she produce another sputum sample?  Wyn Quaker FNP

## 2018-12-06 NOTE — Telephone Encounter (Signed)
Pt is returning call. Cb is [phone].

## 2018-12-09 ENCOUNTER — Other Ambulatory Visit: Payer: Self-pay | Admitting: *Deleted

## 2018-12-09 ENCOUNTER — Other Ambulatory Visit: Payer: Self-pay

## 2018-12-09 DIAGNOSIS — E782 Mixed hyperlipidemia: Secondary | ICD-10-CM

## 2018-12-09 NOTE — Patient Outreach (Signed)
[facility]) Care Management  12/09/2018  Sarah Escobar [birth year] [id number]  Transition of care telephone call: Date referral received: 12/05/17 Insurance: Holland Falling  Initial unsuccessful telephone call to patient's preferred number (mobile) in order to complete transition of care assessment; no answer, left HIPAA compliant voicemail message requesting return call.  Sarah Escobar was hospitalized at [facility]  from  10/24-1/20/19 for non ST elevation myocardial infarction with 3 vessel CAD, she underwent coronary artery bypass grafting x3 with the left internal mammary to the left anterior descending coronary artery, reverse saphenous vein graft to the diagonal coronary artery, reverse saphenous vein graft to the distal right coronary artery with bilateral lower leg open left lung biopsy, wedge resection on 10/01/18. Comorbidities include: HTN, ischemic cardiomyopathy, paroxysmal atrial fibrillation, interstitial lung disease, asthma, osteopenia, anxiety, depression, and hyperlipidemia.  She was discharged to home on 10/23/18 with 3 in 1,  home oxygen at 3 liters at rest and 6 liters with activity, and home health RN and physical therapy services  provided by [facility]. .   Plan: This RNCM will attempt another outreach within 4 business days and will route unsuccessful outreach letter to [place] Management clinical pool to be mailed to patient's home address.  Barrington Ellison RN,CCM,CDE [place] Management Coordinator Office Phone [phone] Office Fax [phone]

## 2018-12-09 NOTE — Telephone Encounter (Signed)
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

## 2018-12-09 NOTE — Telephone Encounter (Signed)
Called and spoke with patient regarding RA recommendations below. Pt verbalized understanding and will come by the labs for another sputum culture Placed order today for respiratory culture. Nothing further needed at this time.

## 2018-12-10 ENCOUNTER — Telehealth: Payer: Self-pay | Admitting: *Deleted

## 2018-12-10 NOTE — Telephone Encounter (Signed)
Left message for patient to call and schedule appointment with Pharm D for Lipid consult

## 2018-12-11 ENCOUNTER — Telehealth: Payer: Self-pay | Admitting: Pulmonary Disease

## 2018-12-11 NOTE — Telephone Encounter (Signed)
Received call from [place], [facility] lab, on sputum culture showing growth collected 11/18/18.  Results are being faxed for Dr. Elsworth Soho, triage fax given [phone].  Will route to Dr. Elsworth Soho

## 2018-12-12 ENCOUNTER — Other Ambulatory Visit: Payer: Self-pay | Admitting: *Deleted

## 2018-12-12 ENCOUNTER — Telehealth: Payer: Self-pay | Admitting: Pulmonary Disease

## 2018-12-12 DIAGNOSIS — J849 Interstitial pulmonary disease, unspecified: Secondary | ICD-10-CM

## 2018-12-12 NOTE — Telephone Encounter (Signed)
Called and spoke to pt, who is requesting update on pulmonary rehab. Per our records order that was placed on 12/05/18 has been canceled.   PCC's can you guys help with this?

## 2018-12-12 NOTE — Patient Outreach (Addendum)
Sarah Escobar Sarah Escobar Sarah Escobar) Care Management  12/12/2018  Sarah Escobar Dec 07, 1955 [id number]  Transition of care telephone call: Date referral received: 12/05/17 Insurance: Holland Falling  Subjective: Reached Sarah Escobar via her preferred number (mobile) in order to complete transition of care assessment.  Explained purpose of call. Sarah Escobar said "I don't feel like talking, I was laying down and I have an infection." She declined this RNCM's help with answering any concerns today and did agree to a follow up call next week.  Objective: Sarah Escobar was hospitalized at [facility]  from  10/24-1/20/19 for non ST elevation myocardial infarction with 3 vessel CAD, she underwent coronary artery bypass grafting x 3 with the left internal mammary to the left anterior descending coronary artery, reverse saphenous vein graft to the diagonal coronary artery, reverse saphenous vein graft to the distal right coronary artery with bilateral lower leg open left lung biopsy, wedge resection on 10/01/18. Comorbidities include: HTN, ischemic cardiomyopathy, paroxysmal atrial fibrillation, interstitial lung disease, asthma, osteopenia, anxiety, depression, and hyperlipidemia.  She was discharged to home on 10/23/18 with 3 in 1,  home oxygen at 3 liters at rest and 6 liters with activity, and home health RN and physical therapy services  provided by [facility]. .   Plan: This RNCM will attempt third outreach on 12/16/18 in order to complete transition of care assessment.  Sarah Ellison RN,CCM,CDE [place] Management Coordinator Office Phone [phone] Office Fax [phone]

## 2018-12-12 NOTE — Telephone Encounter (Signed)
Pulmonary rehab referral has been re ordered per 12/05/18 OV note. Lm to make pt aware.

## 2018-12-12 NOTE — Telephone Encounter (Signed)
The order was cancelled by a nurse in Cardiac Rehab im not sure why It states it was created in error we may need to put it in again

## 2018-12-13 NOTE — Telephone Encounter (Signed)
I was told that she was not a candidate for cardiac rehab. Hence ordered pulmonary rehab

## 2018-12-13 NOTE — Telephone Encounter (Signed)
Left message for patient

## 2018-12-13 NOTE — Telephone Encounter (Signed)
Mendel Ryder called from [facility]  cardiac and pulmonary rehab to confirm which treatment should be done first.Per patient they  thought Dr. Elsworth Soho wanted Pulmonary to be first. Please advise.

## 2018-12-13 NOTE — Telephone Encounter (Signed)
Dr. Elsworth Soho do you want pulmonary rehab first?

## 2018-12-14 ENCOUNTER — Other Ambulatory Visit: Payer: Self-pay | Admitting: Pulmonary Disease

## 2018-12-16 ENCOUNTER — Other Ambulatory Visit: Payer: Self-pay | Admitting: *Deleted

## 2018-12-16 ENCOUNTER — Ambulatory Visit: Payer: Self-pay | Admitting: *Deleted

## 2018-12-16 ENCOUNTER — Other Ambulatory Visit: Payer: Self-pay | Admitting: Physician Assistant

## 2018-12-16 ENCOUNTER — Telehealth: Payer: Self-pay | Admitting: Pulmonary Disease

## 2018-12-16 MED ORDER — PREDNISONE 10 MG PO TABS: ORAL_TABLET | ORAL | 0 refills | Status: DC

## 2018-12-16 NOTE — Telephone Encounter (Signed)
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 Waiting on lab for full results which might take a few more weeks. We will discuss implications of this on next visit -may be simply a colonizer

## 2018-12-16 NOTE — Telephone Encounter (Signed)
LMTCB

## 2018-12-16 NOTE — Progress Notes (Signed)
Patient called requesting to speak with someone about Prednisone refill.  She stated with the increase she is needing a refill, to be sent to [facility] in [place].  Refill sent to preferred pharmacy.  Nothing further at this time.  Per Dr. Elsworth Soho 12/05/18- Increase prednisone to 20 mg on Monday/Wednesday/Friday Keep at 15 mg on other days

## 2018-12-16 NOTE — Telephone Encounter (Signed)
Received call report from [facility] with results on patient's sputum done on 11/18/18. please review the result/impression copied below: Fax received    Component 4wk ago  MICRO NUMBER: [id number] P  SPECIMEN QUALITY: Adequate P  Source: SPUTUM P  STATUS: PRELIMINARY P  SMEAR: No acid fast bacilli seen. 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 for Identification. DNA probe result negative for M. tuberculosis complex (    Please advise, thank you.  Message routed to Dr. Elsworth Soho

## 2018-12-16 NOTE — Telephone Encounter (Signed)
Looked at pt's recent labs and the recent labs that were ordered by Dr. Elsworth Soho have not been resulted in pt's chart. Due to not having a phone number or any lab results to be able to view, will hold in triage in hopes that we will receive a return call.

## 2018-12-17 ENCOUNTER — Other Ambulatory Visit: Payer: Self-pay | Admitting: *Deleted

## 2018-12-17 ENCOUNTER — Ambulatory Visit: Payer: 59 | Admitting: Pulmonary Disease

## 2018-12-17 NOTE — Patient Outreach (Signed)
[facility]) Care Management  12/17/2018  Sarah Escobar Aug 01, 1956 [id number]   Transition of care telephone call: Date referral received: 12/05/17 Insurance: [employer] Falling   [name] called this RNCM back and stated anytime next week is OK for a follow up call.  Will plan to cal her next week per her request.  Barrington Ellison RN,CCM,CDE [place] Management Coordinator Office Phone [phone] Office Fax [phone]

## 2018-12-17 NOTE — Patient Outreach (Addendum)
[place]) Care Management  12/17/2018  Sarah Escobar [birth year] [id number]  Transition of care telephone call: Date referral received: 12/05/17 Insurance: Holland Falling  Subjective: Reached Sarah Escobar via her preferred number(mobile)in order to complete transition of care assessment.  Explained purpose of call. She states she is still not feeling well and has "lots of things going on with her work and many other things".  She did verify that she has an appointment at the [facility] with Sarah Escobar on 01/25/19 for second opinion and transplant referral (per Sarah Escobar office visit note of 12/05/18). She requests a call back next week.  Objective:  Per the electronic medial record, Sarah Escobar is scheduled to see her cardiac surgeon on 1/23 and will have cardiac rehabilitation orientation on 1/28. Once she completes cardiac rehabilitation, Sarah Escobar has made a referral for pulmonary rehabilitation.  Plan: This RNCM will attempt third outreach the week of 1/20-1/24/20 in order to complete transition of care assessment.   Barrington Ellison RN,CCM,CDE [place] Management Coordinator Office Phone [phone] Office Fax [phone]

## 2018-12-17 NOTE — Telephone Encounter (Signed)
Spoke with Ria Comment at rehab and she states she spoke with RA and the pt about this and is just waiting for Dr. Eliseo Squires to clear her for cardiac rehab. She had a CABBAGE done and they feel she should do cardiac rehab first. Nothing further is needed.

## 2018-12-18 ENCOUNTER — Telehealth (HOSPITAL_COMMUNITY): Payer: Self-pay | Admitting: *Deleted

## 2018-12-18 NOTE — Telephone Encounter (Signed)
Called and spoke with Patient.  Dr. Elsworth Soho results and recommendations given. Understanding stated.  Patient stated that she still doesn't feel completely well, and asked about finishing her antibiotic.  Advised Patient to finished antibiotic prescribed and if still feeling bad, to call back for recommendations, or OV.

## 2018-12-18 NOTE — Telephone Encounter (Signed)
-----   Message from Rigoberto Noel, MD sent at 12/17/2018  5:18 PM EST ----- Regarding: RE: Oxygen therapy for cardiac rehab 88% & above ok  RA ----- Message ----- From: Rowe Pavy, RN Sent: 12/17/2018   2:57 PM EST To: Rigoberto Noel, MD Subject: Oxygen therapy for cardiac rehab                Dr. Elsworth Soho,   Pt is scheduled to participate in cardiac rehab s/p 09/26/18 NSTEMI and 09/30/18 CABG x 3. As you are aware, pt uses oxygen therapy.  For exercise, may we titrate her oxygen to maintain her O2 saturation?  What is an appropriate saturation for this pt on exertion and at rest?   Thanks for your valued input Psychologist, clinical, BSN Cardiac and Pulmonary Rehab Nurse Navigator

## 2018-12-19 ENCOUNTER — Telehealth (HOSPITAL_COMMUNITY): Payer: Self-pay

## 2018-12-19 NOTE — Telephone Encounter (Signed)
Pt returned call. Pt informed of clearance by Dr. Elsworth Soho and Dr. Servando Snare for cardiac rehab prior to start of pulmonary rehab. Pt informed that Dr. Servando Snare approved a shortened rehab of 6 weeks with then transitioning to pulmonary rehab as long as pt experiences no events. Pt agreeable. Confirmed appointment for cardiac rehab orientation on 1/28. All questions answered.   Joycelyn Man RN, BSN Cardiac and Pulmonary Rehab RN

## 2018-12-19 NOTE — Telephone Encounter (Signed)
Called pt back and left message for callback at pt convenience.   Joycelyn Man RN, BSN Cardiac and Pulmonary Rehab RN

## 2018-12-23 ENCOUNTER — Telehealth: Payer: Self-pay | Admitting: Pulmonary Disease

## 2018-12-23 DIAGNOSIS — R0602 Shortness of breath: Secondary | ICD-10-CM | POA: Diagnosis not present

## 2018-12-23 DIAGNOSIS — J9611 Chronic respiratory failure with hypoxia: Secondary | ICD-10-CM | POA: Diagnosis not present

## 2018-12-23 DIAGNOSIS — J9621 Acute and chronic respiratory failure with hypoxia: Secondary | ICD-10-CM | POA: Diagnosis not present

## 2018-12-23 NOTE — Telephone Encounter (Signed)
Primary Pulmonologist: Dr Elsworth Soho Last office visit and with whom: 12/05/18 What do we see them for (pulmonary problems): ILD Last OV assessment/plan:       Instructions      Return in about 1 month (around 01/05/2019).  Referral to pulmonary rehab Referral to [facility]-Dr. Randol Kern for second opinion.  We will place paperwork for OFEV 150 twice daily-we discussed side effects of diarrhea and liver toxicity  Increase prednisone to 20 mg on Monday/Wednesday/Friday Keep at 15 mg on other days        Was appointment offered to patient (explain)?  Yes, declined OV because Dr. Elsworth Soho instructed her in the past to call and we can prescribe her something, so does not have to come out.   Reason for call: Patient feels that she has sinus infection.  She has head congestion, pressure, with green/yellow mucus.  She denies fever, but has non productive cough. She has been taking Tylenol and Mucinex, with no results.  Will route to Wyn Quaker, NP

## 2018-12-23 NOTE — Telephone Encounter (Signed)
Ok. Can route note to RA now as FYI.   Aaron Edelman

## 2018-12-23 NOTE — Telephone Encounter (Signed)
Spoke with pt. She is aware of Brian's response. Pt states that she will back tomorrow so that Dr. Elsworth Soho can handle her issues. Nothing further was needed.

## 2018-12-23 NOTE — Telephone Encounter (Signed)
Please route to App of the day.   Sarah Escobar

## 2018-12-23 NOTE — Telephone Encounter (Signed)
Patient can start using Mucinex DM.  Another over-the-counter medications.  Can also start doing nasal saline rinses.  And Flonase if she has not tried that.    When symptoms are lasting longer than 7 to 10 days then we can start considering antibiotic therapy.  Patient is more than welcome to present to our office for further evaluation if she would like.  I currently do not see an indication for an antibiotic at this time.  Wyn Quaker, FNP

## 2018-12-23 NOTE — Telephone Encounter (Signed)
Primary Pulmonologist: Dr. Elsworth Soho Last office visit and with whom: 12/05/2018 with Dr. Elsworth Soho What do we see them for (pulmonary problems): ILD  Was appointment offered to patient (explain)?  Yes, pt declined.  Reason for call:  Spoke with pt. States that she has a sinus infection. Reports sinus pressure, sinus congestion and sinus headache. Denies chest tightness, wheezing, coughing, shortness of breath or fever. Symptoms started 2-3 days ago. She has not tried any OTC medications. States that this happens all the time and Dr. Elsworth Soho always gives her an antibiotic without having to come in.  Aaron Edelman - please advise. Thanks.

## 2018-12-24 ENCOUNTER — Telehealth: Payer: Self-pay | Admitting: Cardiology

## 2018-12-24 NOTE — Telephone Encounter (Signed)
New Message    Patient had Hepatic Function Panel done 12/09/18 and has a letter to have them done again 01/09/19 and wants to make sure she needs to have them labs done so she doesn't repeat it unless she has too.

## 2018-12-24 NOTE — Telephone Encounter (Signed)
Called patient, advised that she should have repeat labs done after stopping the Simvastatin.  Patient verbalized understanding.

## 2018-12-25 ENCOUNTER — Telehealth (HOSPITAL_COMMUNITY): Payer: Self-pay | Admitting: Pharmacy Technician

## 2018-12-25 ENCOUNTER — Other Ambulatory Visit: Payer: Self-pay | Admitting: Cardiothoracic Surgery

## 2018-12-25 ENCOUNTER — Telehealth: Payer: Self-pay | Admitting: Pulmonary Disease

## 2018-12-25 DIAGNOSIS — Z951 Presence of aortocoronary bypass graft: Secondary | ICD-10-CM

## 2018-12-25 NOTE — Telephone Encounter (Signed)
Cardiac Rehab Medication Review by a Pharmacist  Does the patient  feel that his/her medications are working for him/her?  yes  Has the patient been experiencing any side effects to the medications prescribed?  no  Does the patient measure his/her own blood pressure or blood glucose at home?  yes   Does the patient have any problems obtaining medications due to transportation or finances?   no  Understanding of regimen: fair Understanding of indications: fair Potential of compliance: fair  Pharmacist comments: Patient take BP a few times a week with SBP usually running low (around 100 mmHg) and DBP roughly 70-80. Patient reports having a few "dizzy spells."   Brendolyn Patty, PharmD PGY1 Pharmacy Resident Phone [phone]  12/25/2018   3:19 PM

## 2018-12-25 NOTE — Telephone Encounter (Signed)
Received call report from [place] with [facility] on patient's sputum done on 11/18/18. 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  This is final result.  [facility] is sending fax with results to [phone].    Routed message to Dr. Elsworth Soho

## 2018-12-26 ENCOUNTER — Ambulatory Visit: Payer: Self-pay | Admitting: *Deleted

## 2018-12-26 ENCOUNTER — Ambulatory Visit (INDEPENDENT_AMBULATORY_CARE_PROVIDER_SITE_OTHER): Payer: Self-pay | Admitting: Cardiothoracic Surgery

## 2018-12-26 ENCOUNTER — Ambulatory Visit
Admission: RE | Admit: 2018-12-26 | Discharge: 2018-12-26 | Disposition: A | Discharge status: Home / Self Care | Payer: 59 | Source: Ambulatory Visit | Attending: Cardiothoracic Surgery | Admitting: Cardiothoracic Surgery

## 2018-12-26 ENCOUNTER — Ambulatory Visit: Payer: 59 | Admitting: Cardiovascular Disease

## 2018-12-26 ENCOUNTER — Other Ambulatory Visit: Payer: Self-pay

## 2018-12-26 ENCOUNTER — Encounter: Payer: Self-pay | Admitting: Cardiothoracic Surgery

## 2018-12-26 ENCOUNTER — Other Ambulatory Visit: Payer: Self-pay | Admitting: *Deleted

## 2018-12-26 VITALS — BP 100/60 | HR 82 | Resp 20 | Ht 66.0 in | Wt 121.0 lb

## 2018-12-26 DIAGNOSIS — Z951 Presence of aortocoronary bypass graft: Secondary | ICD-10-CM

## 2018-12-26 DIAGNOSIS — R05 Cough: Secondary | ICD-10-CM | POA: Diagnosis not present

## 2018-12-26 DIAGNOSIS — J849 Interstitial pulmonary disease, unspecified: Secondary | ICD-10-CM

## 2018-12-26 DIAGNOSIS — Z09 Encounter for follow-up examination after completed treatment for conditions other than malignant neoplasm: Secondary | ICD-10-CM

## 2018-12-26 NOTE — Patient Outreach (Addendum)
[facility] Care Management  12/26/2018  Sarah Escobar 03-Aug-1956 [id number]   Transition of care telephone call: Date referral received: 12/05/17 Initial outreach: 12/09/18 Insurance: Holland Falling  Subjective:  Reached Sarah Escobar via herpreferred number(mobile)in order to complete transition of care assessment. Explained purpose of call. 2 HIPAA identifiers verified. Sarah Escobar says she is preparing for her appointment with her cardiac surgeon later today and will answer any questions she can but she has limited time. Transition of care assessment completed.  She agrees to a referral to [employer]'s disease management program and also requests someone from the benefits department call her back as she does not feel she was provided with the correct information regarding her copay for cardiac rehabilitation which she is scheduled to start on 12/31/18.  She again verified she is to see an interstitial lung disease specialist at [facility] on 01/25/19 for treatment options and possible lung transplant consideration.   Objective:  Sarah Escobar was hospitalized at [facility]  from 10/24-1/20/19 for non ST elevation myocardial infarction with 3 vessel CAD, she underwent coronary artery bypass grafting x3 with the left internal mammary to the left anterior descending coronary artery, reverse saphenous vein graft to the diagonal coronary artery, reverse saphenous vein graft to the distal right coronary artery with bilateral lower leg open left lung biopsy, wedge resection on 10/01/18. Comorbidities include: HTN, ischemic cardiomyopathy, paroxysmal atrial fibrillation, interstitial lung disease, asthma, osteopenia, anxiety, depression, and hyperlipidemia.  She was discharged to home on 10/23/18 with 3 in 1,  home oxygen at 3 liters at rest and 6 liters with activity, and home health RN and physical therapy services  provided by [facility].   Assessment: See transition of care flowsheet for  assessment details.    Plan: This RNCM will make referral to [employer]'s disease management and/or case management program via secure fax of completed referral form to [place]. Will also ask Margaretha Sheffield to have customer service representative in [place] benefits contact Sarah [facility] for benefit questions and concerns.  Will close case to [place] Management services.  Barrington Ellison RN,CCM,CDE [place] Management Coordinator Office Phone [phone] Office Fax [phone]

## 2018-12-26 NOTE — Progress Notes (Signed)
[address]       [place] [address]             [phone]      Christia S Gu [place] Medical Record #[id number] Date of Birth: October 18, 1956  Referring: Nelva Bush, MD Primary Care: Cassandria Anger,   Chief Complaint:   POST OP FOLLOW UP DATE OF PROCEDURE:  09/30/2018  PREOPERATIVE DIAGNOSES:   1.  Coronary occlusive disease with recent non-STEMI myocardial infarction.   2.  Chronic lung disease.  POSTOPERATIVE DIAGNOSES:   1.  Coronary occlusive disease with recent non-STEMI myocardial infarction.   2.  Chronic lung disease.  SURGICAL PROCEDURE:  Coronary artery bypass grafting x3 with the left internal mammary to the left anterior descending coronary artery, reverse saphenous vein graft to the diagonal coronary artery, reverse saphenous vein graft to the distal right  coronary artery with bilateral lower leg open left lung biopsy, wedge resection.  SURGEON:  Lanelle Bal, MD  History of Present Illness:     Patient returns to the office for follow-up visit.  She underwent coronary artery bypass grafting September 30, 2018.  Prior to surgery she had significant underlying interstitial lung disease, significantly interfered with her postoperative course with severe respiratory failure, in addition to significant amount of postoperative atrial fib which was difficult to control.    Remains limited by sob with activity, remains on o2   Past Medical History:  Diagnosis Date  . Allergy   . Asthma    adult onset  . Depression   . Hyperlipidemia    LDL goal = < 100  . Hypertension   . Osteopenia    mild  . Personal history of adenomatous colonic polyps/FHx colon cancer sister and father    [name]  . Sciatica of left side    intermittent     Social History   Tobacco Use  Smoking Status Former Smoker  . Packs/day: 1.00  . Years: 30.00  . Pack years: 30.00  . Types: Cigarettes  . Last attempt to quit: 12/04/2001  .  Years since quitting: 17.0  Smokeless Tobacco Never Used  Tobacco Comment    smoked 1973-2003, up to 1.5 ppd    Social History   Substance and Sexual Activity  Alcohol Use Yes  . Alcohol/week: 0.0 standard drinks   Comment: occasionally     Allergies  Allergen Reactions  . Flagyl [Metronidazole] Other (See Comments)    Nerve pain  . Levofloxacin Rash  . Loperamide Hcl Rash  . Neomycin-Bacitracin Zn-Polymyx Rash  . Ofloxacin Rash  . Sulfonamide Derivatives Hives  . Band-Aid Liquid Bandage [New Skin] Other (See Comments)    Regular band-aid cause itching and redness  . Collodion Dermatitis  . Gabapentin Other (See Comments)    Mental status changes  . Latex Other (See Comments) and Dermatitis    "redness"  . Sulfa Antibiotics Hives  . Oxycontin [Oxycodone Hcl] Rash         Current Outpatient Medications  Medication Sig Dispense Refill  . Biotin 5000 MCG CAPS Take 5,000 mcg by mouth daily.     Marland Kitchen buPROPion (WELLBUTRIN XL) 300 MG 24 hr tablet Take 1 tablet (300 mg total) by mouth every morning. Must keep upcoming appt to get refills 90 tablet 0  . Cholecalciferol 2000 units TABS Take 2,000 Units by mouth daily.     . digoxin (LANOXIN) 0.125 MG tablet Take 1 tablet (0.125 mg total) by  mouth daily. 30 tablet 3  . estrogen-methylTESTOSTERone 0.625-1.25 MG tablet Take 1 tablet by mouth daily.    . feeding supplement, ENSURE ENLIVE, (ENSURE ENLIVE) LIQD Take 237 mLs by mouth 2 (two) times daily between meals. (Patient taking differently: Take 237 mLs by mouth 3 (three) times a week. ) 237 mL 12  . fexofenadine (ALLEGRA) 180 MG tablet Take 180 mg by mouth daily.    . finasteride (PROSCAR) 5 MG tablet Take 2.5 mg by mouth daily.     . Flaxseed, Linseed, (FLAXSEED OIL) 1000 MG CAPS Take 1,000 mg by mouth 2 (two) times daily.     . fluticasone (FLONASE) 50 MCG/ACT nasal spray USE 1 SPRAY IN EACH NOSTRIL TWO TIMES A DAY AS NEEDED (Patient taking differently: Place 1 spray into both  nostrils 2 (two) times daily as needed for allergies. ) 48 g 3  . fluticasone furoate-vilanterol (BREO ELLIPTA) 100-25 MCG/INH AEPB USE 1 INHALATION DAILY (Patient taking differently: Inhale 1 puff into the lungs daily. ) 180 each 3  . furosemide (LASIX) 20 MG tablet TAKE 1 TABLET BY MOUTH ONCE DAILY 30 tablet 0  . guaiFENesin (MUCINEX) 600 MG 12 hr tablet Take 2 tablets (1,200 mg total) by mouth 2 (two) times daily. With full glass of water 60 tablet 0  . Ipratropium-Albuterol (COMBIVENT RESPIMAT) 20-100 MCG/ACT AERS respimat Inhale 1 puff into the lungs 4 (four) times daily as needed for wheezing. Overdue for annual appt must see provider for refills 3 Inhaler 0  . ipratropium-albuterol (DUONEB) 0.5-2.5 (3) MG/3ML SOLN Take 3 mLs by nebulization every 8 (eight) hours. 360 mL 2  . levalbuterol (XOPENEX) 1.25 MG/0.5ML nebulizer solution Take 1.25 mg by nebulization every 6 (six) hours as needed for wheezing or shortness of breath. 30 each 1  . LORazepam (ATIVAN) 0.5 MG tablet Take 1-2 tablets (0.5-1 mg total) by mouth at bedtime as needed for anxiety. Patient needs office visit before refills will be given (Patient taking differently: Take 0.5 mg by mouth at bedtime as needed for anxiety. Patient needs office visit before refills will be given) 60 tablet 2  . metoprolol tartrate (LOPRESSOR) 25 MG tablet Take 1 tablet (25 mg total) by mouth 2 (two) times daily. 60 tablet 3  . Multiple Vitamins-Minerals (ONE-A-DAY 50 PLUS PO) Take 1 tablet by mouth daily.     . potassium chloride SA (K-DUR,KLOR-CON) 10 MEQ tablet Take 1 tablet (10 mEq total) by mouth daily. 30 tablet 3  . predniSONE (DELTASONE) 10 MG tablet 20mg  M/W/F, and 15mg  T/TH/Sat/Sun 60 tablet 0  . Respiratory Therapy Supplies (FLUTTER) DEVI Use a directed. 1 each 0  . traMADol (ULTRAM) 50 MG tablet Take 1-2 tablets (50-100 mg total) by mouth every 6 (six) hours as needed for moderate pain. 28 tablet 0  . minoxidil (ROGAINE) 2 % external solution  Apply 1 application topically as needed (for hair loss).      No current facility-administered medications for this visit.        Physical Exam: BP 100/60 (BP Location: Right Arm, Patient Position: Sitting, Cuff Size: Normal)   Pulse 82   Resp 20   Ht 5\' 6"  (1.676 m)   Wt 121 lb (54.9 kg)   SpO2 98% Comment: ON 6L O2  BMI 19.53 kg/m  General appearance: alert, cooperative, cachectic and no distress Head: Normocephalic, without obvious abnormality, atraumatic Resp: diminished breath sounds bibasilar Cardio: regular rate and rhythm, S1, S2 normal, no murmur, click, rub or gallop GI: soft, non-tender;  bowel sounds normal; no masses,  no organomegaly Extremities: extremities normal, atraumatic, no cyanosis or edema and Homans sign is negative, no sign of DVT Neurologic: Grossly normal     Diagnostic Studies & Laboratory data:     Recent Radiology Findings:   Dg Chest 2 View  Result Date: 12/26/2018 CLINICAL DATA:  Cough and shortness of breath. Status post CABG in October 2019. History of asthma. EXAM: CHEST - 2 VIEW COMPARISON:  11/28/2018. FINDINGS: Stable enlarged cardiac silhouette and post CABG changes. Stable marked diffuse prominence of the interstitial markings without superimposed airspace opacity. Stable cervical spine fixation hardware and thoracic spine degenerative changes and mild scoliosis. IMPRESSION: No acute abnormality. Stable cardiomegaly and marked chronic interstitial lung disease. Electronically Signed   By: Claudie Revering M.D.   On: 12/26/2018 13:41   I have independently reviewed the above radiology studies  and reviewed the findings with the patient.     Recent Lab Findings: Lab Results  Component Value Date   WBC 17.5 (H) 10/18/2018   HGB 11.2 (L) 10/18/2018   HCT 34.2 (L) 10/18/2018   PLT 391 10/18/2018   GLUCOSE 89 10/18/2018   CHOL 120 09/27/2018   TRIG 68 09/27/2018   HDL 36 (L) 09/27/2018   LDLCALC 70 09/27/2018   ALT 70 (H) 12/05/2018    AST 35 12/05/2018   NA 128 (L) 10/18/2018   K 4.3 10/18/2018   CL 93 (L) 10/18/2018   CREATININE 0.81 10/18/2018   BUN 19 10/18/2018   CO2 29 10/18/2018   TSH 1.676 09/30/2018   INR 1.43 09/30/2018   HGBA1C 5.3 09/30/2018      Assessment / Plan:      Patient status post coronary artery bypass grafting with significant underlying interstitial lung disease which is limited her recovery, starting cardiac and pulmonary rehab soon   [name] MD      [address] [place] [address] Office [phone]   Beeper [phone]  12/26/2018 2:11 PM

## 2018-12-27 ENCOUNTER — Telehealth: Payer: Self-pay | Admitting: Pulmonary Disease

## 2018-12-27 NOTE — Telephone Encounter (Signed)
Primary Pulmonologist: Elsworth Soho Last office visit and with whom:  What do we see them for (pulmonary problems): ILD Last OV assessment/plan: 12/05/2018  Was appointment offered to patient (explain)? Yes but stated RA advised her to call if she had symptoms   Reason for call: Congestion, headache, general feeling bad,  requesting antibiotics  (examples of things to ask: : When did symptoms start? Couple weeks ago  Fever? No fever   Cough? Productive? Color to sputum? Yes cough with a little production/yellowish in color    More sputum than usual?  No, not in her chest  Wheezing? No   Have you needed increased oxygen? Yes using more oxygen at 7L normally at 6L  Are you taking your respiratory medications? What over the counter measures have you tried?) Yes using medications prescribed, along with Allegra, Mucinex, Flonase  RA please advise.

## 2018-12-27 NOTE — Telephone Encounter (Signed)
Called pt and advised message from the provider. Pt understood and verbalized understanding. Nothing further is needed.    

## 2018-12-27 NOTE — Telephone Encounter (Signed)
She has received too many antibiotics recently. Suggest over-the-counter decongestant

## 2018-12-29 NOTE — Progress Notes (Signed)
Devine Dant Penalver 63 y.o. female DOB 1956-02-01 MRN [id number]       Nutrition Screener Note  No diagnosis found. Past Medical History:  Diagnosis Date  . Allergy   . Asthma    adult onset  . Depression   . Hyperlipidemia    LDL goal = < 100  . Hypertension   . Osteopenia    mild  . Personal history of adenomatous colonic polyps/FHx colon cancer sister and father    [name]  . Sciatica of left side    intermittent   Meds reviewed.    Current Outpatient Medications (Endocrine & Metabolic):  .  estrogen-methylTESTOSTERone 0.625-1.25 MG tablet, Take 1 tablet by mouth daily. .  predniSONE (DELTASONE) 10 MG tablet, 20mg  M/W/F, and 15mg  T/TH/Sat/Sun  Current Outpatient Medications (Cardiovascular):  .  digoxin (LANOXIN) 0.125 MG tablet, Take 1 tablet (0.125 mg total) by mouth daily. .  furosemide (LASIX) 20 MG tablet, TAKE 1 TABLET BY MOUTH ONCE DAILY .  metoprolol tartrate (LOPRESSOR) 25 MG tablet, Take 1 tablet (25 mg total) by mouth 2 (two) times daily.  Current Outpatient Medications (Respiratory):  .  fexofenadine (ALLEGRA) 180 MG tablet, Take 180 mg by mouth daily. .  fluticasone (FLONASE) 50 MCG/ACT nasal spray, USE 1 SPRAY IN EACH NOSTRIL TWO TIMES A DAY AS NEEDED (Patient taking differently: Place 1 spray into both nostrils 2 (two) times daily as needed for allergies. ) .  fluticasone furoate-vilanterol (BREO ELLIPTA) 100-25 MCG/INH AEPB, USE 1 INHALATION DAILY (Patient taking differently: Inhale 1 puff into the lungs daily. ) .  guaiFENesin (MUCINEX) 600 MG 12 hr tablet, Take 2 tablets (1,200 mg total) by mouth 2 (two) times daily. With full glass of water .  Ipratropium-Albuterol (COMBIVENT RESPIMAT) 20-100 MCG/ACT AERS respimat, Inhale 1 puff into the lungs 4 (four) times daily as needed for wheezing. Overdue for annual appt must see provider for refills .  ipratropium-albuterol (DUONEB) 0.5-2.5 (3) MG/3ML SOLN, Take 3 mLs by nebulization every 8 (eight) hours. Marland Kitchen   levalbuterol (XOPENEX) 1.25 MG/0.5ML nebulizer solution, Take 1.25 mg by nebulization every 6 (six) hours as needed for wheezing or shortness of breath.  Current Outpatient Medications (Analgesics):  .  traMADol (ULTRAM) 50 MG tablet, Take 1-2 tablets (50-100 mg total) by mouth every 6 (six) hours as needed for moderate pain.   Current Outpatient Medications (Other):  .  Biotin 5000 MCG CAPS, Take 5,000 mcg by mouth daily.  Marland Kitchen  buPROPion (WELLBUTRIN XL) 300 MG 24 hr tablet, Take 1 tablet (300 mg total) by mouth every morning. Must keep upcoming appt to get refills .  Cholecalciferol 2000 units TABS, Take 2,000 Units by mouth daily.  .  feeding supplement, ENSURE ENLIVE, (ENSURE ENLIVE) LIQD, Take 237 mLs by mouth 2 (two) times daily between meals. (Patient taking differently: Take 237 mLs by mouth 3 (three) times a week. ) .  finasteride (PROSCAR) 5 MG tablet, Take 2.5 mg by mouth daily.  .  Flaxseed, Linseed, (FLAXSEED OIL) 1000 MG CAPS, Take 1,000 mg by mouth 2 (two) times daily.  Marland Kitchen  LORazepam (ATIVAN) 0.5 MG tablet, Take 1-2 tablets (0.5-1 mg total) by mouth at bedtime as needed for anxiety. Patient needs office visit before refills will be given (Patient taking differently: Take 0.5 mg by mouth at bedtime as needed for anxiety. Patient needs office visit before refills will be given) .  minoxidil (ROGAINE) 2 % external solution, Apply 1 application topically as needed (for hair loss).  Marland Kitchen  Multiple Vitamins-Minerals (ONE-A-DAY 50 PLUS PO), Take 1 tablet by mouth daily.  .  potassium chloride SA (K-DUR,KLOR-CON) 10 MEQ tablet, Take 1 tablet (10 mEq total) by mouth daily. Marland Kitchen  Respiratory Therapy Supplies (FLUTTER) DEVI, Use a directed.   HT: Ht Readings from Last 1 Encounters:  12/26/18 5\' 6"  (1.676 m)    WT: Wt Readings from Last 5 Encounters:  12/26/18 121 lb (54.9 kg)  12/05/18 121 lb 12.8 oz (55.2 kg)  11/28/18 121 lb (54.9 kg)  11/14/18 122 lb (55.3 kg)  11/11/18 126 lb (57.2 kg)      BMI = 19.54    12/26/18   Current tobacco use? No       Labs:  Lipid Panel     Component Value Date/Time   CHOL 120 09/27/2018 0058   CHOL 121 12/14/2017 1454   CHOL 121 (L) 12-05-202017 0801   TRIG 68 09/27/2018 0058   TRIG 62 12-05-202017 0801   HDL 36 (L) 09/27/2018 0058   HDL 41 12/14/2017 1454   HDL 42 (L) 12-05-202017 0801   CHOLHDL 3.3 09/27/2018 0058   VLDL 14 09/27/2018 0058   LDLCALC 70 09/27/2018 0058   LDLCALC 60 12/14/2017 1454   LDLCALC 67 12-05-202017 0801    Lab Results  Component Value Date   HGBA1C 5.3 09/30/2018   CBG (last 3)  No results for input(s): GLUCAP in the last 72 hours.  Nutrition Diagnosis ? Food-and nutrition-related knowledge deficit related to lack of exposure to information as related to diagnosis of: ? CVD   Nutrition Goal(s):  ? To be determined  Plan:  Pt to attend nutrition classes ? Nutrition I ? Nutrition II ? Portion Distortion  Will provide client-centered nutrition education as part of interdisciplinary care.   Monitor and evaluate progress toward nutrition goal with team.  Laurina Bustle, MS, RD, LDN 12/29/2018 1:19 PM

## 2018-12-31 ENCOUNTER — Encounter (HOSPITAL_COMMUNITY)
Admission: RE | Admit: 2018-12-31 | Discharge: 2018-12-31 | Disposition: A | Discharge status: Home / Self Care | Payer: 59 | Source: Ambulatory Visit | Attending: Cardiovascular Disease | Admitting: Cardiovascular Disease

## 2018-12-31 ENCOUNTER — Telehealth: Payer: Self-pay | Admitting: Pulmonary Disease

## 2018-12-31 ENCOUNTER — Telehealth: Payer: Self-pay | Admitting: Cardiovascular Disease

## 2018-12-31 ENCOUNTER — Encounter (HOSPITAL_COMMUNITY): Payer: Self-pay

## 2018-12-31 ENCOUNTER — Ambulatory Visit (INDEPENDENT_AMBULATORY_CARE_PROVIDER_SITE_OTHER): Payer: 59 | Admitting: Pulmonary Disease

## 2018-12-31 ENCOUNTER — Telehealth (HOSPITAL_COMMUNITY): Payer: Self-pay | Admitting: *Deleted

## 2018-12-31 ENCOUNTER — Encounter: Payer: Self-pay | Admitting: Pulmonary Disease

## 2018-12-31 VITALS — BP 98/70 | HR 87 | Ht 65.0 in | Wt 125.2 lb

## 2018-12-31 VITALS — BP 108/68 | HR 90 | Ht 66.0 in | Wt 119.0 lb

## 2018-12-31 DIAGNOSIS — R05 Cough: Secondary | ICD-10-CM | POA: Diagnosis not present

## 2018-12-31 DIAGNOSIS — J0101 Acute recurrent maxillary sinusitis: Secondary | ICD-10-CM

## 2018-12-31 DIAGNOSIS — A319 Mycobacterial infection, unspecified: Secondary | ICD-10-CM | POA: Diagnosis not present

## 2018-12-31 DIAGNOSIS — Z951 Presence of aortocoronary bypass graft: Secondary | ICD-10-CM | POA: Insufficient documentation

## 2018-12-31 DIAGNOSIS — J9611 Chronic respiratory failure with hypoxia: Secondary | ICD-10-CM

## 2018-12-31 DIAGNOSIS — R059 Cough, unspecified: Secondary | ICD-10-CM

## 2018-12-31 DIAGNOSIS — J479 Bronchiectasis, uncomplicated: Secondary | ICD-10-CM

## 2018-12-31 DIAGNOSIS — J849 Interstitial pulmonary disease, unspecified: Secondary | ICD-10-CM

## 2018-12-31 DIAGNOSIS — I214 Non-ST elevation (NSTEMI) myocardial infarction: Secondary | ICD-10-CM | POA: Diagnosis not present

## 2018-12-31 HISTORY — DX: Atherosclerotic heart disease of native coronary artery without angina pectoris: I25.10

## 2018-12-31 MED ORDER — PREDNISONE 10 MG PO TABS: ORAL_TABLET | ORAL | 0 refills | Status: DC

## 2018-12-31 MED ORDER — AZITHROMYCIN 500 MG PO TABS: ORAL_TABLET | ORAL | 1 refills | Status: AC

## 2018-12-31 NOTE — Progress Notes (Signed)
Pt in this morning for cardiac rehab orientation.  Pt is s/p CABG x 3 in October. Pt is on [place] and having difficulty maintaining her O2 saturation above 80. Pt only able to ambulate one lap around the track(200 feet).  Walk test stopped due to o2 saturation dropped into the upper 70's with slow recovery to 88-89% on 8Lnc with purse lip breathing over 4-5 minutes sitting still.  Pt dropped to 79% on 8Lnc during the grip strength test.  Pt unable to lay down for initial measurements due to O2 saturation dropping in the 70's.  Pt complains of feeling tired and low energy.  Breath sounds typical ILD crackles in the base with rhonchi and congestion throughout upper lobes no wheezes with non productive cough. Pt has used her nebulizer treatment twice this morning. Pt denies a fever. Pt contacted Dr. Elsworth Soho office on last week for "sinsusitis".  Called and spoke to scheduler at Dr. Elsworth Soho office requesting pt be seen today.  Pt scheduled for 10:45.  Pt escorted to main entrance of the hospital via wheelchair accompanied by husband using our O2 tank in order to preserve her personal tank to last for the office appt and drive home.  Pt advised that her office note would be reviewed and she will be contacted for when she may return for exercise. Cherre Huger, BSN Cardiac and Training and development officer

## 2018-12-31 NOTE — Assessment & Plan Note (Signed)
We will give her azithromycin 500 mg 3 times a week for 4 to 8 weeks as chronic suppressive therapy-due to her frequent requests of antibiotics for yellow sputum

## 2018-12-31 NOTE — Telephone Encounter (Signed)
Spoke with a Software engineer at [employer]. She is aware of RA's response. Orders for the lab work has been ordered. Nothing further was needed.

## 2018-12-31 NOTE — Progress Notes (Signed)
Cardiac Individual Treatment Plan  Patient Details  Name: Sarah Escobar MRN: [id number] Date of Birth: 1956/11/17 Referring Provider:     CARDIAC REHAB PHASE II ORIENTATION from 12/31/2018 in [facility]  Referring Provider  Dr. 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  azithromycin (ZITHROMAX) 500 MG tablet, Take 3 times (M,W,F)per week for 4-8 weeks, Disp: 12 tablet, Rfl: 1 .  Biotin 5000 MCG CAPS, Take 5,000 mcg by mouth daily. , Disp: , Rfl:  .  buPROPion (WELLBUTRIN XL) 300 MG 24 hr tablet, Take 1 tablet (300 mg total) by mouth every morning. Must keep upcoming appt to get refills, Disp: 90 tablet, Rfl: 0 .  Cholecalciferol 2000 units TABS, Take 2,000 Units by mouth daily. , Disp: , Rfl:  .  digoxin (LANOXIN) 0.125 MG tablet, Take 1 tablet (0.125 mg total) by mouth daily., Disp: 30 tablet, Rfl: 3 .  estrogen-methylTESTOSTERone 0.625-1.25 MG tablet, Take 1 tablet by mouth daily., Disp: , Rfl:  .  feeding supplement, ENSURE ENLIVE, (ENSURE ENLIVE) LIQD, Take 237 mLs by mouth 2 (two) times daily between meals. (Patient taking differently: Take 237 mLs by mouth 3 (three) times a week. ), Disp: 237 mL, Rfl: 12 .  fexofenadine (ALLEGRA) 180 MG tablet, Take 180 mg by mouth daily., Disp: , Rfl:  .  finasteride (PROSCAR) 5 MG tablet, Take 2.5 mg by mouth daily. , Disp: , Rfl:  .  Flaxseed, Linseed, (FLAXSEED OIL) 1000 MG CAPS, Take 1,000 mg by mouth 2 (two) times daily. , Disp: , Rfl:  .  fluticasone (FLONASE) 50 MCG/ACT nasal spray, USE 1 SPRAY IN EACH NOSTRIL TWO TIMES A DAY AS NEEDED (Patient taking differently: Place 1 spray into both nostrils 2 (two) times  daily as needed for allergies. ), Disp: 48 g, Rfl: 3 .  fluticasone furoate-vilanterol (BREO ELLIPTA) 100-25 MCG/INH AEPB, USE 1 INHALATION DAILY (Patient taking differently: Inhale 1 puff into the lungs daily. ), Disp: 180 each, Rfl: 3 .  furosemide (LASIX) 20 MG tablet, TAKE 1 TABLET BY MOUTH ONCE DAILY, Disp: 30 tablet, Rfl: 0 .  guaiFENesin (MUCINEX) 600 MG 12 hr tablet, Take 2 tablets (1,200 mg total) by mouth 2 (two) times daily. With full glass of water, Disp: 60 tablet, Rfl: 0 .  Ipratropium-Albuterol (COMBIVENT RESPIMAT) 20-100 MCG/ACT AERS respimat, Inhale 1 puff into the lungs 4 (four) times daily as needed for wheezing. Overdue for annual appt must see provider for refills, Disp: 3 Inhaler, Rfl: 0 .  ipratropium-albuterol (DUONEB) 0.5-2.5 (3) MG/3ML SOLN, Take 3 mLs by nebulization every 8 (eight) hours., Disp: 360 mL, Rfl: 2 .  levalbuterol (XOPENEX) 1.25 MG/0.5ML nebulizer solution, Take 1.25 mg by nebulization every 6 (six) hours as needed for wheezing or shortness of breath., Disp: 30 each, Rfl: 1 .  LORazepam (ATIVAN) 0.5 MG tablet, Take 1-2 tablets (0.5-1 mg total) by mouth at bedtime as needed for anxiety. Patient needs office visit before refills will be given (Patient taking differently: Take 0.5 mg by mouth at bedtime as needed for anxiety. Patient needs office visit before refills will be given), Disp: 60 tablet, Rfl: 2 .  metoprolol tartrate (LOPRESSOR) 25  MG tablet, Take 1 tablet (25 mg total) by mouth 2 (two) times daily., Disp: 60 tablet, Rfl: 3 .  minoxidil (ROGAINE) 2 % external solution, Apply 1 application topically as needed (for hair loss). , Disp: , Rfl:  .  Multiple Vitamins-Minerals (ONE-A-DAY 50 PLUS PO), Take 1 tablet by mouth daily. , Disp: , Rfl:  .  potassium chloride SA (K-DUR,KLOR-CON) 10 MEQ tablet, Take 1 tablet (10 mEq total) by mouth daily., Disp: 30 tablet, Rfl: 3 .  predniSONE (DELTASONE) 10 MG tablet, Increase to 40mg dailyx1week,30mg dailyx1week,then stay  on 20mg daily, Disp: 60 tablet, Rfl: 0 .  Respiratory Therapy Supplies (FLUTTER) DEVI, Use a directed., Disp: 1 each, Rfl: 0 .  traMADol (ULTRAM) 50 MG tablet, Take 1-2 tablets (50-100 mg total) by mouth every 6 (six) hours as needed for moderate pain., Disp: 28 tablet, Rfl: 0  Past Medical History: Past Medical History:  Diagnosis Date  . Allergy   . Asthma    adult onset  . Coronary artery disease   . Depression   . Hyperlipidemia    LDL goal = < 100  . Hypertension   . Osteopenia    mild  . Personal history of adenomatous colonic polyps/FHx colon cancer sister and father    [name]  . Sciatica of left side    intermittent    Tobacco Use: Social History   Tobacco Use  Smoking Status Former Smoker  . Packs/day: 1.00  . Years: 30.00  . Pack years: 30.00  . Types: Cigarettes  . Last attempt to quit: 12/04/2001  . Years since quitting: 17.0  Smokeless Tobacco Never Used  Tobacco Comment    smoked 1973-2003, up to 1.5 ppd    Labs: Recent Review Flowsheet Data    Labs for ITP Cardiac and Pulmonary Rehab Latest Ref Rng & Units 09/30/2018 09/30/2018 10/01/2018 10/12/2018 10/12/2018   Cholestrol 0 - 200 mg/dL - - - - -   LDLCALC 0 - 99 mg/dL - - - - -   HDL >40 mg/dL - - - - -   Trlycerides <150 mg/dL - - - - -   Hemoglobin A1c 4.8 - 5.6 % - - - - -   PHART 7.350 - 7.450 - 7.349(L) - - -   PCO2ART 32.0 - 48.0 mmHg - 40.1 - - -   HCO3 20.0 - 28.0 mmol/L - 22.1 - - -   TCO2 22 - 32 mmol/L 22 23 23 30  33(H)   ACIDBASEDEF 0.0 - 2.0 mmol/L - 3.0(H) - - -   O2SAT % - 95.0 - - -      Capillary Blood Glucose: Lab Results  Component Value Date   GLUCAP 77 10/21/2018   GLUCAP 120 (H) 10/20/2018   GLUCAP 156 (H) 10/20/2018   GLUCAP 144 (H) 10/20/2018   GLUCAP 80 10/20/2018     Exercise Target Goals: Exercise Program Goal: Individual exercise prescription set using results from initial 6 min walk test and THRR while considering  patient's activity barriers and safety.    Exercise Prescription Goal: Initial exercise prescription builds to 30-45 minutes a day of aerobic activity, 2-3 days per week.  Home exercise guidelines will be given to patient during program as part of exercise prescription that the participant will acknowledge.  Activity Barriers & Risk Stratification: Activity Barriers & Cardiac Risk Stratification - 12/31/18 1134      Activity Barriers & Cardiac Risk Stratification   Activity Barriers  Muscular Weakness;Deconditioning;Balance Concerns;Shortness of Breath;Other (comment)  Comments  Oxygen use.     Cardiac Risk Stratification  High       6 Minute Walk: 6 Minute Walk    Row Name 12/31/18 1131         6 Minute Walk   Phase  Initial     Distance  200 feet     Walk Time  6 minutes     # of Rest Breaks  3     MPH  0.3     METS  1.48     RPE  15     Perceived Dyspnea   3     Symptoms  Yes (comment)     Comments  fatigue, 2-3/10 pain in chest     Resting HR  87 bpm     Resting BP  98/70     Resting Oxygen Saturation   88 %     Exercise Oxygen Saturation  during 6 min walk  83 %     Max Ex. HR  96 bpm     Max Ex. BP  100/64     2 Minute Post BP  98/62        Oxygen Initial Assessment:   Oxygen Re-Evaluation:   Oxygen Discharge (Final Oxygen Re-Evaluation):   Initial Exercise Prescription: Initial Exercise Prescription - 12/31/18 1100      Date of Initial Exercise RX and Referring Provider   Date  12/31/18    Referring Provider  Dr. Oval Linsey    Expected Discharge Date  04/11/19      Oxygen   Oxygen  Continuous    Liters  8      NuStep   Level  2    SPM  75    Minutes  10    METs  1.3      Prescription Details   Frequency (times per week)  3    Duration  Progress to 30 minutes of continuous aerobic without signs/symptoms of physical distress      Intensity   THRR 40-80% of Max Heartrate  63-126    Ratings of Perceived Exertion  11-13    Perceived Dyspnea  0-4      Progression   Progression   Continue to progress workloads to maintain intensity without signs/symptoms of physical distress.      Resistance Training   Training Prescription  Yes    Weight  2 lbs.     Reps  10-15       Perform Capillary Blood Glucose checks as needed.  Exercise Prescription Changes:   Exercise Comments:   Exercise Goals and Review: Exercise Goals    Row Name 12/31/18 1135             Exercise Goals   Increase Physical Activity  Yes       Intervention  Provide advice, education, support and counseling about physical activity/exercise needs.;Develop an individualized exercise prescription for aerobic and resistive training based on initial evaluation findings, risk stratification, comorbidities and participant's personal goals.       Expected Outcomes  Short Term: Attend rehab on a regular basis to increase amount of physical activity.       Increase Strength and Stamina  Yes       Intervention  Provide advice, education, support and counseling about physical activity/exercise needs.;Develop an individualized exercise prescription for aerobic and resistive training based on initial evaluation findings, risk stratification, comorbidities and participant's personal goals.       Expected Outcomes  Short Term: Increase workloads from initial exercise prescription for resistance, speed, and METs.       Able to understand and use rate of perceived exertion (RPE) scale  Yes       Intervention  Provide education and explanation on how to use RPE scale       Expected Outcomes  Short Term: Able to use RPE daily in rehab to express subjective intensity level;Long Term:  Able to use RPE to guide intensity level when exercising independently       Able to understand and use Dyspnea scale  Yes       Intervention  Provide education and explanation on how to use Dyspnea scale       Expected Outcomes  Short Term: Able to use Dyspnea scale daily in rehab to express subjective sense of shortness of breath  during exertion;Long Term: Able to use Dyspnea scale to guide intensity level when exercising independently       Knowledge and understanding of Target Heart Rate Range (THRR)  Yes       Intervention  Provide education and explanation of THRR including how the numbers were predicted and where they are located for reference       Expected Outcomes  Short Term: Able to state/look up THRR;Long Term: Able to use THRR to govern intensity when exercising independently;Short Term: Able to use daily as guideline for intensity in rehab       Able to check pulse independently  Yes       Intervention  Provide education and demonstration on how to check pulse in carotid and radial arteries.;Review the importance of being able to check your own pulse for safety during independent exercise       Expected Outcomes  Short Term: Able to explain why pulse checking is important during independent exercise;Long Term: Able to check pulse independently and accurately       Understanding of Exercise Prescription  Yes       Intervention  Provide education, explanation, and written materials on patient's individual exercise prescription       Expected Outcomes  Short Term: Able to explain program exercise prescription;Long Term: Able to explain home exercise prescription to exercise independently          Exercise Goals Re-Evaluation :   Discharge Exercise Prescription (Final Exercise Prescription Changes):   Nutrition:  Target Goals: Understanding of nutrition guidelines, daily intake of sodium 1500mg , cholesterol 200mg , calories 30% from fat and 7% or less from saturated fats, daily to have 5 or more servings of fruits and vegetables.  Biometrics: Pre Biometrics - 12/31/18 1135      Pre Biometrics   Height  5\' 5"  (1.651 m)    Weight  56.8 kg    Waist Circumference  35 inches    Hip Circumference  35.5 inches    Waist to Hip Ratio  0.99 %    BMI (Calculated)  20.84    Triceps Skinfold  15 mm    % Body Fat   31.8 %    Grip Strength  31 kg    Flexibility  9 in    Single Leg Stand  7.31 seconds        Nutrition Therapy Plan and Nutrition Goals: Nutrition Therapy & Goals - 12/31/18 1507      Nutrition Therapy   Diet  heart healthy      Personal Nutrition Goals   Nutrition Goal  to be determined  Intervention Plan   Intervention  Prescribe, educate and counsel regarding individualized specific dietary modifications aiming towards targeted core components such as weight, hypertension, lipid management, diabetes, heart failure and other comorbidities.    Expected Outcomes  Short Term Goal: Understand basic principles of dietary content, such as calories, fat, sodium, cholesterol and nutrients.;Long Term Goal: Adherence to prescribed nutrition plan.       Nutrition Assessments: Nutrition Assessments - 12/31/18 1507      MEDFICTS Scores   Pre Score  24       Nutrition Goals Re-Evaluation:   Nutrition Goals Re-Evaluation:   Nutrition Goals Discharge (Final Nutrition Goals Re-Evaluation):   Psychosocial: Target Goals: Acknowledge presence or absence of significant depression and/or stress, maximize coping skills, provide positive support system. Participant is able to verbalize types and ability to use techniques and skills needed for reducing stress and depression.  Initial Review & Psychosocial Screening: Initial Psych Review & Screening - 12/31/18 1423      Initial Review   Current issues with  Current Depression;Current Anxiety/Panic;Current Stress Concerns    Source of Stress Concerns  Chronic Illness;Unable to participate in former interests or hobbies;Unable to perform yard/household activities    Comments  Sarah Escobar has pulmonary fibrosis in addition to her CAD      Family Dynamics   Good Support System?  Yes   Sarah Escobar has her husband for support   Comments  Sarah Escobar has a lot of health issues that adds to her depression and stresss      Barriers   Psychosocial  barriers to participate in program  The patient should benefit from training in stress management and relaxation.      Screening Interventions   Interventions  Encouraged to exercise;To provide support and resources with identified psychosocial needs;Provide feedback about the scores to participant    Expected Outcomes  Short Term goal: Utilizing psychosocial counselor, staff and physician to assist with identification of specific Stressors or current issues interfering with healing process. Setting desired goal for each stressor or current issue identified.;Long Term Goal: Stressors or current issues are controlled or eliminated.;Short Term goal: Identification and review with participant of any Quality of Life or Depression concerns found by scoring the questionnaire.;Long Term goal: The participant improves quality of Life and PHQ9 Scores as seen by post scores and/or verbalization of changes       Quality of Life Scores: Quality of Life - 12/31/18 1102      Quality of Life   Select  Quality of Life      Quality of Life Scores   Health/Function Pre  5.2 %    Socioeconomic Pre  22.21 %    Psych/Spiritual Pre  4.29 %    Family Pre  25.4 %    GLOBAL Pre  11.46 %      Scores of 19 and below usually indicate a poorer quality of life in these areas.  A difference of  2-3 points is a clinically meaningful difference.  A difference of 2-3 points in the total score of the Quality of Life Index has been associated with significant improvement in overall quality of life, self-image, physical symptoms, and general health in studies assessing change in quality of life.  PHQ-9: Recent Review Flowsheet Data    There is no flowsheet data to display.     Interpretation of Total Score  Total Score Depression Severity:  1-4 = Minimal depression, 5-9 = Mild depression, 10-14 = Moderate depression, 15-19 = Moderately severe  depression, 20-27 = Severe depression   Psychosocial Evaluation and  Intervention:   Psychosocial Re-Evaluation:   Psychosocial Discharge (Final Psychosocial Re-Evaluation):   Vocational Rehabilitation: Provide vocational rehab assistance to qualifying candidates.   Vocational Rehab Evaluation & Intervention: Vocational Rehab - 12/31/18 1437      Initial Vocational Rehab Evaluation & Intervention   Assessment shows need for Vocational Rehabilitation  No   Will give packet to patient for review      Education: Education Goals: Education classes will be provided on a weekly basis, covering required topics. Participant will state understanding/return demonstration of topics presented.  Learning Barriers/Preferences: Learning Barriers/Preferences - 12/31/18 1103      Learning Barriers/Preferences   Learning Barriers  Exercise Concerns   Dizziness   Learning Preferences  Audio;Individual Instruction       Education Topics: Count Your Pulse:  -Group instruction provided by verbal instruction, demonstration, patient participation and written materials to support subject.  Instructors address importance of being able to find your pulse and how to count your pulse when at home without a heart monitor.  Patients get hands on experience counting their pulse with staff help and individually.   Heart Attack, Angina, and Risk Factor Modification:  -Group instruction provided by verbal instruction, video, and written materials to support subject.  Instructors address signs and symptoms of angina and heart attacks.    Also discuss risk factors for heart disease and how to make changes to improve heart health risk factors.   Functional Fitness:  -Group instruction provided by verbal instruction, demonstration, patient participation, and written materials to support subject.  Instructors address safety measures for doing things around the house.  Discuss how to get up and down off the floor, how to pick things up properly, how to safely get out of a chair  without assistance, and balance training.   Meditation and Mindfulness:  -Group instruction provided by verbal instruction, patient participation, and written materials to support subject.  Instructor addresses importance of mindfulness and meditation practice to help reduce stress and improve awareness.  Instructor also leads participants through a meditation exercise.    Stretching for Flexibility and Mobility:  -Group instruction provided by verbal instruction, patient participation, and written materials to support subject.  Instructors lead participants through series of stretches that are designed to increase flexibility thus improving mobility.  These stretches are additional exercise for major muscle groups that are typically performed during regular warm up and cool down.   Hands Only CPR:  -Group verbal, video, and participation provides a basic overview of AHA guidelines for community CPR. Role-play of emergencies allow participants the opportunity to practice calling for help and chest compression technique with discussion of AED use.   Hypertension: -Group verbal and written instruction that provides a basic overview of hypertension including the most recent diagnostic guidelines, risk factor reduction with self-care instructions and medication management.    Nutrition I class: Heart Healthy Eating:  -Group instruction provided by PowerPoint slides, verbal discussion, and written materials to support subject matter. The instructor gives an explanation and review of the Therapeutic Lifestyle Changes diet recommendations, which includes a discussion on lipid goals, dietary fat, sodium, fiber, plant stanol/sterol esters, sugar, and the components of a well-balanced, healthy diet.   Nutrition II class: Lifestyle Skills:  -Group instruction provided by PowerPoint slides, verbal discussion, and written materials to support subject matter. The instructor gives an explanation and review  of label reading, grocery shopping for heart health, heart healthy recipe  modifications, and ways to make healthier choices when eating out.   Diabetes Question & Answer:  -Group instruction provided by PowerPoint slides, verbal discussion, and written materials to support subject matter. The instructor gives an explanation and review of diabetes co-morbidities, pre- and post-prandial blood glucose goals, pre-exercise blood glucose goals, signs, symptoms, and treatment of hypoglycemia and hyperglycemia, and foot care basics.   Diabetes Blitz:  -Group instruction provided by PowerPoint slides, verbal discussion, and written materials to support subject matter. The instructor gives an explanation and review of the physiology behind type 1 and type 2 diabetes, diabetes medications and rational behind using different medications, pre- and post-prandial blood glucose recommendations and Hemoglobin A1c goals, diabetes diet, and exercise including blood glucose guidelines for exercising safely.    Portion Distortion:  -Group instruction provided by PowerPoint slides, verbal discussion, written materials, and food models to support subject matter. The instructor gives an explanation of serving size versus portion size, changes in portions sizes over the last 20 years, and what consists of a serving from each food group.   Stress Management:  -Group instruction provided by verbal instruction, video, and written materials to support subject matter.  Instructors review role of stress in heart disease and how to cope with stress positively.     Exercising on Your Own:  -Group instruction provided by verbal instruction, power point, and written materials to support subject.  Instructors discuss benefits of exercise, components of exercise, frequency and intensity of exercise, and end points for exercise.  Also discuss use of nitroglycerin and activating EMS.  Review options of places to exercise outside of  rehab.  Review guidelines for sex with heart disease.   Cardiac Drugs I:  -Group instruction provided by verbal instruction and written materials to support subject.  Instructor reviews cardiac drug classes: antiplatelets, anticoagulants, beta blockers, and statins.  Instructor discusses reasons, side effects, and lifestyle considerations for each drug class.   Cardiac Drugs II:  -Group instruction provided by verbal instruction and written materials to support subject.  Instructor reviews cardiac drug classes: angiotensin converting enzyme inhibitors (ACE-I), angiotensin II receptor blockers (ARBs), nitrates, and calcium channel blockers.  Instructor discusses reasons, side effects, and lifestyle considerations for each drug class.   Anatomy and Physiology of the Circulatory System:  Group verbal and written instruction and models provide basic cardiac anatomy and physiology, with the coronary electrical and arterial systems. Review of: AMI, Angina, Valve disease, Heart Failure, Peripheral Artery Disease, Cardiac Arrhythmia, Pacemakers, and the ICD.   Other Education:  -Group or individual verbal, written, or video instructions that support the educational goals of the cardiac rehab program.   Holiday Eating Survival Tips:  -Group instruction provided by PowerPoint slides, verbal discussion, and written materials to support subject matter. The instructor gives patients tips, tricks, and techniques to help them not only survive but enjoy the holidays despite the onslaught of food that accompanies the holidays.   Knowledge Questionnaire Score: Knowledge Questionnaire Score - 12/31/18 1102      Knowledge Questionnaire Score   Pre Score  21/24       Core Components/Risk Factors/Patient Goals at Admission: Personal Goals and Risk Factors at Admission - 12/31/18 1104      Core Components/Risk Factors/Patient Goals on Admission    Weight Management  Yes;Weight Maintenance     Intervention  Weight Management: Develop a combined nutrition and exercise program designed to reach desired caloric intake, while maintaining appropriate intake of nutrient and fiber, sodium and fats,  and appropriate energy expenditure required for the weight goal.;Weight Management: Provide education and appropriate resources to help participant work on and attain dietary goals.    Admit Weight  125 lb 3.5 oz (56.8 kg)    Expected Outcomes  Short Term: Continue to assess and modify interventions until short term weight is achieved;Long Term: Adherence to nutrition and physical activity/exercise program aimed toward attainment of established weight goal;Weight Maintenance: Understanding of the daily nutrition guidelines, which includes 25-35% calories from fat, 7% or less cal from saturated fats, less than 200mg  cholesterol, less than 1.5gm of sodium, & 5 or more servings of fruits and vegetables daily;Understanding recommendations for meals to include 15-35% energy as protein, 25-35% energy from fat, 35-60% energy from carbohydrates, less than 200mg  of dietary cholesterol, 20-35 gm of total fiber daily;Understanding of distribution of calorie intake throughout the day with the consumption of 4-5 meals/snacks    Improve shortness of breath with ADL's  Yes    Intervention  Provide education, individualized exercise plan and daily activity instruction to help decrease symptoms of SOB with activities of daily living.    Expected Outcomes  Short Term: Improve cardiorespiratory fitness to achieve a reduction of symptoms when performing ADLs;Long Term: Be able to perform more ADLs without symptoms or delay the onset of symptoms    Hypertension  Yes    Intervention  Provide education on lifestyle modifcations including regular physical activity/exercise, weight management, moderate sodium restriction and increased consumption of fresh fruit, vegetables, and low fat dairy, alcohol moderation, and smoking  cessation.;Monitor prescription use compliance.    Expected Outcomes  Short Term: Continued assessment and intervention until BP is < 140/106mm HG in hypertensive participants. < 130/71mm HG in hypertensive participants with diabetes, heart failure or chronic kidney disease.;Long Term: Maintenance of blood pressure at goal levels.    Lipids  Yes    Intervention  Provide education and support for participant on nutrition & aerobic/resistive exercise along with prescribed medications to achieve LDL 70mg , HDL >40mg .    Expected Outcomes  Short Term: Participant states understanding of desired cholesterol values and is compliant with medications prescribed. Participant is following exercise prescription and nutrition guidelines.;Long Term: Cholesterol controlled with medications as prescribed, with individualized exercise RX and with personalized nutrition plan. Value goals: LDL < 70mg , HDL > 40 mg.    Stress  Yes    Intervention  Offer individual and/or small group education and counseling on adjustment to heart disease, stress management and health-related lifestyle change. Teach and support self-help strategies.;Refer participants experiencing significant psychosocial distress to appropriate mental health specialists for further evaluation and treatment. When possible, include family members and significant others in education/counseling sessions.    Expected Outcomes  Short Term: Participant demonstrates changes in health-related behavior, relaxation and other stress management skills, ability to obtain effective social support, and compliance with psychotropic medications if prescribed.;Long Term: Emotional wellbeing is indicated by absence of clinically significant psychosocial distress or social isolation.       Core Components/Risk Factors/Patient Goals Review:    Core Components/Risk Factors/Patient Goals at Discharge (Final Review):    ITP Comments: ITP Comments    Row Name 12/31/18 1127            ITP Comments  Dr. Fransico Him, Medical Director          Comments: Tashonda attended orientation from 0830 to [phone] to review rules and guidelines for program. Completed 6 minute walk test, Intitial ITP, and exercise prescription.  VSS. Telemetry-Sinus Rhythm  with intermittent PVC's. Sarah Escobar resting oxygen saturation was 88% on 6l/min and was increased to 8l/min during her walk test in which she walked one lap.Please see previous note for details. Patient did go to see Dr Elsworth Soho immediately after orientation for evaluation. Sarah Escobar's resting oxygen saturation's went back to mid 90% after resting. Will fax today's walk test sheet Dr. Bari Mantis office for review and will fax today's ECG tracings to Dr Blenda Mounts office for review. Maurice Small RN nurse navigator received order for the patient to use a Oxymizer pendant. Will hold off on having Sarah Escobar return to exercise until she receives the pendant from home health. Will plan to have the patient exercise in cardiac rehab for 30 days then transfer to pulmonary rehab.Barnet Pall, RN,BSN 12/31/2018 3:40 PM

## 2018-12-31 NOTE — Telephone Encounter (Signed)
Spoke with Verdis Frederickson from cardiac rehab who states pt presented today for her 6 min walk test, and was only able to preform 1 lap before her O2 dropped to 83 % on 6 L.  She also report pt had some intermittent PVC's which she will fax strip for Dr. Oval Linsey to review. She states pt was seen today by Dr. Elsworth Soho and prescribed prednisone and ordered a oxymizer pendant. She is calling to inquire if Dr. Oval Linsey feels pt should be seen in office before she continues rehab. Will route to MD and Nurse.

## 2018-12-31 NOTE — Assessment & Plan Note (Addendum)
Increase prednisone to 40 mg for 1 week, then 30 mg for 1 week, then stay at 20 mg Will need steroid sparing agent  Await LFTs on feb 3, if normal we will send in Rx for OFEV  Await [facility] input on - alternative for steroids -role of OFev - mycobacterium -transplant

## 2018-12-31 NOTE — Telephone Encounter (Signed)
Called and spoke with Patient. She went to pharmacy to pick up prescriptions from [facility], and they told her she did not have anything.  Per Dr Elsworth Soho 12/31/18 OV- We will give her azithromycin 500 mg 3 times a week for 4 to 8 weeks as chronic suppressive therapy-due to her frequent requests of antibiotics for yellow sputum. Increase prednisone to 40 mg for 1 week, then 30 mg for 1 week, then stay at 20 mg.  Patient stated she still had 15-20 10 mg Prednisone tabs from last prescription. Prescriptions sent to preferred pharmacy.  Nothing further at this time.

## 2018-12-31 NOTE — Telephone Encounter (Signed)
Okay since we are only using azithromycin every other day Please add on digoxin levels to her next labs scheduled for February 3 with LFTs

## 2018-12-31 NOTE — Addendum Note (Signed)
Addended by: Georjean Mode on: 12/31/2018 12:22 PM   Modules accepted: Orders

## 2018-12-31 NOTE — Telephone Encounter (Signed)
Spoke with Marcie Bal at [facility]. She states that they received the prescription for Azithromycin. Pt is currently taking Digoxin 0.125mg  daily. There is a contradiction between these 2 medications. Marcie Bal is wanting to verify that Dr. Elsworth Soho is aware of this and if it's okay to fill the Azithromycin.  Dr. Elsworth Soho - please advise. Thanks.

## 2018-12-31 NOTE — Assessment & Plan Note (Signed)
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

## 2018-12-31 NOTE — Assessment & Plan Note (Signed)
We will send order for Oxymizer pendant to [facility] to accept satn 85% on exercise & increase O2 as needed during rehab

## 2018-12-31 NOTE — Addendum Note (Signed)
Addended by: Amado Coe on: 12/31/2018 12:23 PM   Modules accepted: Orders

## 2018-12-31 NOTE — Patient Instructions (Addendum)
We will send order for Oxymizer pendant to [facility] to accept satn 85% on exercise & increase O2 as needed during rehab  Increase prednisone to 40 mg for 1 week, then 30 mg for 1 week, then stay at 20 mg  Await LFTs on feb 3, if normal we will send in Rx for OFEV  We discussed mycobacterium in sputum culture Repeat sputum culture for bacteria & afb   Await [facility] input on - alternative for steroids -role of OFev - mycobacterium -transplant

## 2018-12-31 NOTE — Telephone Encounter (Signed)
It sounds like this is respiratory.   She should just wait on cardiac rehab for a week until she finishes the treatment prescribed by her pulmonologist.

## 2018-12-31 NOTE — Progress Notes (Signed)
Subjective:    Patient ID: Sarah Escobar, female    DOB: [birth year], 63 y.o.   MRN: [id number]  HPI  63yoex-smoker for FU of ILD , asthmatic bronchitis & bronchiectasis, hypersensitive to perfumes. She smoked 1 PPD for about 30-pack-years before quitting in 2003. 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 Acute Visit    Started pulm rehab today, had to stop 3 times due to low O2. At 79% at 6L. Still having some congestion.    At her last visit, we changed prednisone dosing to 20 mg alternating with 15 mg.  She reports increasing dyspnea over the last 2 weeks.  Cough is increased mostly dry but occasionally productive of yellow sputum.  She again requests antibiotics.  I have received multiple calls for antibiotics.  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  Pedal edema is controlled on 20 mg of Lasix daily  At our last visit LFTs showed slightly high AST of 70 so we held off on ordering O FEV    Significant tests/ events reviewed  PFT 04/2014 - no obstruction, preserved lung volumes, DLCO 76%   10/2016Spirometry >> ratio 90, FEV 81%, FVC 71%  2/2019spirometry showed slight drop in FEV1 to 70 and ratio 88>>moderate restriction 09/2018 spirometry ratio 91, FEV1 71%, FVC 60%  HRCT 02/2014-  Nonspecific pattern of interstitial lung disease with bronchiectasis, subpleural reticulation and architectural distortion. There are minor areas of alveolitis in the left upper and lower lobes Serology neg  hypersens panel neg  07/2015 CT sinuses neg 02/2018 HRCT >> slight worse,favor chr hypersens pneumonitis, NOT UIP 09/2018 HRCT favor chronic HP  Past Medical History:  Diagnosis Date  . Allergy   . Asthma    adult onset  . Depression   . Hyperlipidemia    LDL goal = < 100  . Hypertension   . Osteopenia    mild  . Personal history of adenomatous colonic polyps/FHx colon cancer sister and father    [name]  . Sciatica of left side    intermittent     Review of Systems neg for any significant sore throat, dysphagia, itching, sneezing, nasal congestion or excess/ purulent secretions, fever, chills, sweats, unintended wt loss, pleuritic or exertional cp, hempoptysis, orthopnea pnd or change in chronic leg swelling. Also denies presyncope, palpitations, heartburn, abdominal pain, nausea, vomiting, diarrhea or change in bowel or urinary habits, dysuria,hematuria, rash, arthralgias, visual complaints, headache, numbness weakness or ataxia.     Objective:   Physical Exam  Gen. Pleasant, well-nourished, in no distress, anxious affect ENT - no pallor,icterus, no post nasal drip Neck: No JVD, no thyromegaly, no carotid bruits Lungs: no use of accessory muscles, no dullness to percussion, BL 1/2 rales no rhonchi  Cardiovascular: Rhythm regular, heart sounds  normal, no murmurs or gallops, 1+ peripheral edema Abdomen: soft and non-tender, no hepatosplenomegaly, BS normal. Musculoskeletal: No deformities, no cyanosis or  clubbing Neuro:  alert, non focal       Assessment & Plan:

## 2019-01-01 ENCOUNTER — Other Ambulatory Visit: Payer: Self-pay | Admitting: *Deleted

## 2019-01-01 ENCOUNTER — Ambulatory Visit: Payer: 59 | Admitting: Cardiovascular Disease

## 2019-01-01 NOTE — Telephone Encounter (Signed)
Dr Oval Linsey reviewed information from cardiac rehab, ok to proceed in 1 week and continue to monitor. Call back if any further issues. Tiffany Kocher, verbalized understanding.

## 2019-01-01 NOTE — Patient Outreach (Addendum)
[facility]) Care Management  01/01/2019  Shalane Florendo [place] 1956/01/30 [id number]   Received voice mail  from [place] on 12/31/18 at 5:14 pm requesting contact phone numbers for Mrs Nachtigal in regards to referral made by this RNCM  to [employer]'s disease/case management program on 12/26/18 . Returned call to Fraser Din at 9:49 am today at [phone] and provided contact numbers for Mrs. Krusemark along with an update on patient's intolerance to first cardiac rehab session yesterday with sats dropping to upper  70's on 8 liters O2 via nasal cannula after one lap (200 feet) and despite patient using nebulizer twice prior to rehab.   Pat voiced appreciation for information and states she will make outreach to patient today.  Barrington Ellison RN,CCM,CDE [place] Management Coordinator Office Phone [phone] Office Fax [phone]

## 2019-01-01 NOTE — Telephone Encounter (Signed)
Maria from Cardiac Rehab updated with Dr. Blenda Mounts recommendation. Verbalized understanding.

## 2019-01-02 ENCOUNTER — Telehealth (HOSPITAL_COMMUNITY): Payer: Self-pay | Admitting: *Deleted

## 2019-01-03 LAB — MYCOBACTERIA,CULT W/FLUOROCHROME SMEAR
MICRO NUMBER:: 91502138
SMEAR:: NONE SEEN
SPECIMEN QUALITY:: ADEQUATE

## 2019-01-03 LAB — CP MYCOBACTERIAL IDENTIFICATION

## 2019-01-06 ENCOUNTER — Ambulatory Visit (HOSPITAL_COMMUNITY): Payer: 59

## 2019-01-06 ENCOUNTER — Telehealth: Payer: Self-pay | Admitting: Pulmonary Disease

## 2019-01-06 DIAGNOSIS — A319 Mycobacterial infection, unspecified: Secondary | ICD-10-CM

## 2019-01-06 DIAGNOSIS — E782 Mixed hyperlipidemia: Secondary | ICD-10-CM | POA: Diagnosis not present

## 2019-01-06 LAB — HEPATIC FUNCTION PANEL
ALT: 46 IU/L — ABNORMAL HIGH (ref 0–32)
AST: 26 IU/L (ref 0–40)
Albumin: 3.5 g/dL — ABNORMAL LOW (ref 3.8–4.8)
Alkaline Phosphatase: 103 IU/L (ref 39–117)
Bilirubin Total: 0.2 mg/dL (ref 0.0–1.2)
Bilirubin, Direct: 0.09 mg/dL (ref 0.00–0.40)
Total Protein: 6.1 g/dL (ref 6.0–8.5)

## 2019-01-06 NOTE — Telephone Encounter (Signed)
Sputum culture showed Serratia-which is the same bug that we treated while she was in the hospital with IV ceftriaxone for 14 days The only oral antibiotics it is sensitive to is ciprofloxacin, levofloxacin and Bactrim. So we could do  ciprofloxacin 500 twice daily instead x 10 days She can stop azithromycin for the duration of this antibiotic I would also recommend ID consultation for this and Mycobacterium infection

## 2019-01-06 NOTE — Telephone Encounter (Signed)
-----   Message from Sarah Escobar, [place] sent at 01/06/2019  2:11 PM EST ----- Spoke with patient. She states she can not take Levaquin as it causes severe joint pain. She stated that she has been on azithromycin Mondays, Wednesdays and Fridays. She also wants to know what the sputum culture showed.   RA, please advise. Thanks!

## 2019-01-07 ENCOUNTER — Telehealth: Payer: Self-pay | Admitting: Internal Medicine

## 2019-01-07 ENCOUNTER — Telehealth (HOSPITAL_COMMUNITY): Payer: Self-pay | Admitting: *Deleted

## 2019-01-07 MED ORDER — CIPROFLOXACIN HCL 500 MG PO TABS: 500.0000 mg | ORAL_TABLET | Freq: Two times a day (BID) | ORAL | 0 refills | Status: DC

## 2019-01-07 NOTE — Telephone Encounter (Signed)
Called and spoke with patient. Dr Elsworth Soho sputum culture results and recommendations given.  Understanding stated.  ID consult placed and Cipro prescription sent to requested pharmacy, [facility] in [place].    Per Dr Elsworth Soho- Sputum culture showed Serratia-which is the same bug that we treated while she was in the hospital with IV ceftriaxone for 14 days The only oral antibiotics it is sensitive to is ciprofloxacin, levofloxacin and Bactrim. So we could do  ciprofloxacin 500 twice daily instead x 10 days She can stop azithromycin for the duration of this antibiotic I would also recommend ID consultation for this and Mycobacterium infection

## 2019-01-07 NOTE — Telephone Encounter (Signed)
Pt is calling back [phone]

## 2019-01-08 ENCOUNTER — Ambulatory Visit (HOSPITAL_COMMUNITY): Payer: 59

## 2019-01-08 ENCOUNTER — Telehealth (HOSPITAL_COMMUNITY): Payer: Self-pay | Admitting: *Deleted

## 2019-01-09 ENCOUNTER — Other Ambulatory Visit: Payer: Self-pay | Admitting: Internal Medicine

## 2019-01-09 ENCOUNTER — Encounter (HOSPITAL_COMMUNITY): Payer: Self-pay | Admitting: *Deleted

## 2019-01-09 DIAGNOSIS — Z951 Presence of aortocoronary bypass graft: Secondary | ICD-10-CM

## 2019-01-09 DIAGNOSIS — I214 Non-ST elevation (NSTEMI) myocardial infarction: Secondary | ICD-10-CM

## 2019-01-09 NOTE — Telephone Encounter (Signed)
Please advise, pt has not been seen since 10/2017

## 2019-01-09 NOTE — Telephone Encounter (Signed)
Pt would like to know if her LORazepam 0.5 MG could be refilled since she made an appt for 3/26. She said she just had surgery and had to push it out this far      [address], [place]  [place] [id number]

## 2019-01-09 NOTE — Telephone Encounter (Signed)
Copied from [place] [phone]. Topic: Quick Communication - Rx Refill/Question >> Jan 09, 2019  1:14 PM Ahmed Prima L wrote: Medication: buPROPion (WELLBUTRIN XL) 300 MG 24 hr tablet has to be 90 days to get the discount  Has the patient contacted their pharmacy? Yes, pt has appt on 3/26 (Agent: If no, request that the patient contact the pharmacy for the refill.) (Agent: If yes, when and what did the pharmacy advise?)  Preferred Pharmacy (with phone number or street name): [facility], [address] [employer] [place] [address] [place] [address]     Agent: Please be advised that RX refills may take up to 3 business days. We ask that you follow-up with your pharmacy.

## 2019-01-09 NOTE — Progress Notes (Signed)
Cardiac Individual Treatment Plan  Patient Details  Name: Sarah Escobar MRN: [id number] Date of Birth: [birth year] Referring Provider:     CARDIAC REHAB PHASE II ORIENTATION from 12/31/2018 in [facility]  Referring Provider  Dr. 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  azithromycin (ZITHROMAX) 500 MG tablet, Take 3 times (M,W,F)per week for 4-8 weeks, Disp: 12 tablet, Rfl: 1 .  Biotin 5000 MCG CAPS, Take 5,000 mcg by mouth daily. , Disp: , Rfl:  .  buPROPion (WELLBUTRIN XL) 300 MG 24 hr tablet, Take 1 tablet (300 mg total) by mouth every morning. Must keep upcoming appt to get refills, Disp: 90 tablet, Rfl: 0 .  Cholecalciferol 2000 units TABS, Take 2,000 Units by mouth daily. , Disp: , Rfl:  .  ciprofloxacin (CIPRO) 500 MG tablet, Take 1 tablet (500 mg total) by mouth 2 (two) times daily., Disp: 20 tablet, Rfl: 0 .  digoxin (LANOXIN) 0.125 MG tablet, Take 1 tablet (0.125 mg total) by mouth daily., Disp: 30 tablet, Rfl: 3 .  estrogen-methylTESTOSTERone 0.625-1.25 MG tablet, Take 1 tablet by mouth daily., Disp: , Rfl:  .  feeding supplement, ENSURE ENLIVE, (ENSURE ENLIVE) LIQD, Take 237 mLs by mouth 2 (two) times daily between meals. (Patient taking differently: Take 237 mLs by mouth 3 (three) times a week. ), Disp: 237 mL, Rfl: 12 .  fexofenadine (ALLEGRA) 180 MG tablet, Take 180 mg by mouth daily., Disp: , Rfl:  .  finasteride (PROSCAR) 5 MG tablet, Take 2.5 mg by mouth daily. , Disp: , Rfl:  .  Flaxseed, Linseed, (FLAXSEED OIL) 1000 MG CAPS, Take 1,000 mg by mouth 2 (two) times daily. , Disp: , Rfl:  .  fluticasone (FLONASE) 50 MCG/ACT nasal spray, USE 1  SPRAY IN EACH NOSTRIL TWO TIMES A DAY AS NEEDED (Patient taking differently: Place 1 spray into both nostrils 2 (two) times daily as needed for allergies. ), Disp: 48 g, Rfl: 3 .  fluticasone furoate-vilanterol (BREO ELLIPTA) 100-25 MCG/INH AEPB, USE 1 INHALATION DAILY (Patient taking differently: Inhale 1 puff into the lungs daily. ), Disp: 180 each, Rfl: 3 .  furosemide (LASIX) 20 MG tablet, TAKE 1 TABLET BY MOUTH ONCE DAILY, Disp: 30 tablet, Rfl: 0 .  guaiFENesin (MUCINEX) 600 MG 12 hr tablet, Take 2 tablets (1,200 mg total) by mouth 2 (two) times daily. With full glass of water, Disp: 60 tablet, Rfl: 0 .  Ipratropium-Albuterol (COMBIVENT RESPIMAT) 20-100 MCG/ACT AERS respimat, Inhale 1 puff into the lungs 4 (four) times daily as needed for wheezing. Overdue for annual appt must see provider for refills, Disp: 3 Inhaler, Rfl: 0 .  ipratropium-albuterol (DUONEB) 0.5-2.5 (3) MG/3ML SOLN, Take 3 mLs by nebulization every 8 (eight) hours., Disp: 360 mL, Rfl: 2 .  levalbuterol (XOPENEX) 1.25 MG/0.5ML nebulizer solution, Take 1.25 mg by nebulization every 6 (six) hours as needed for wheezing or shortness of breath., Disp: 30 each, Rfl: 1 .  LORazepam (ATIVAN) 0.5 MG tablet, Take 1-2 tablets (0.5-1 mg total) by mouth at bedtime as needed for anxiety. Patient needs office visit before refills will be given (Patient taking differently: Take 0.5 mg by mouth at bedtime  as needed for anxiety. Patient needs office visit before refills will be given), Disp: 60 tablet, Rfl: 2 .  metoprolol tartrate (LOPRESSOR) 25 MG tablet, Take 1 tablet (25 mg total) by mouth 2 (two) times daily., Disp: 60 tablet, Rfl: 3 .  minoxidil (ROGAINE) 2 % external solution, Apply 1 application topically as needed (for hair loss). , Disp: , Rfl:  .  Multiple Vitamins-Minerals (ONE-A-DAY 50 PLUS PO), Take 1 tablet by mouth daily. , Disp: , Rfl:  .  potassium chloride SA (K-DUR,KLOR-CON) 10 MEQ tablet, Take 1 tablet (10 mEq total) by mouth  daily., Disp: 30 tablet, Rfl: 3 .  predniSONE (DELTASONE) 10 MG tablet, Increase to 40mg dailyx1week,30mg dailyx1week,then stay on 20mg daily, Disp: 60 tablet, Rfl: 0 .  Respiratory Therapy Supplies (FLUTTER) DEVI, Use a directed., Disp: 1 each, Rfl: 0 .  traMADol (ULTRAM) 50 MG tablet, Take 1-2 tablets (50-100 mg total) by mouth every 6 (six) hours as needed for moderate pain., Disp: 28 tablet, Rfl: 0  Past Medical History: Past Medical History:  Diagnosis Date  . Allergy   . Asthma    adult onset  . Coronary artery disease   . Depression   . Hyperlipidemia    LDL goal = < 100  . Hypertension   . Osteopenia    mild  . Personal history of adenomatous colonic polyps/FHx colon cancer sister and father    [name]  . Sciatica of left side    intermittent    Tobacco Use: Social History   Tobacco Use  Smoking Status Former Smoker  . Packs/day: 1.00  . Years: 30.00  . Pack years: 30.00  . Types: Cigarettes  . Last attempt to quit: 12/04/2001  . Years since quitting: 17.1  Smokeless Tobacco Never Used  Tobacco Comment    smoked 1973-2003, up to 1.5 ppd    Labs: Recent Review Flowsheet Data    Labs for ITP Cardiac and Pulmonary Rehab Latest Ref Rng & Units 09/30/2018 09/30/2018 10/01/2018 10/12/2018 10/12/2018   Cholestrol 0 - 200 mg/dL - - - - -   LDLCALC 0 - 99 mg/dL - - - - -   HDL >40 mg/dL - - - - -   Trlycerides <150 mg/dL - - - - -   Hemoglobin A1c 4.8 - 5.6 % - - - - -   PHART 7.350 - 7.450 - 7.349(L) - - -   PCO2ART 32.0 - 48.0 mmHg - 40.1 - - -   HCO3 20.0 - 28.0 mmol/L - 22.1 - - -   TCO2 22 - 32 mmol/L 22 23 23 30  33(H)   ACIDBASEDEF 0.0 - 2.0 mmol/L - 3.0(H) - - -   O2SAT % - 95.0 - - -      Capillary Blood Glucose: Lab Results  Component Value Date   GLUCAP 77 10/21/2018   GLUCAP 120 (H) 10/20/2018   GLUCAP 156 (H) 10/20/2018   GLUCAP 144 (H) 10/20/2018   GLUCAP 80 10/20/2018     Exercise Target Goals: Exercise Program Goal: Individual exercise  prescription set using results from initial 6 min walk test and THRR while considering  patient's activity barriers and safety.   Exercise Prescription Goal: Initial exercise prescription builds to 30-45 minutes a day of aerobic activity, 2-3 days per week.  Home exercise guidelines will be given to patient during program as part of exercise prescription that the participant will acknowledge.  Activity Barriers & Risk Stratification: Activity Barriers & Cardiac Risk Stratification - 12/31/18 1134  Activity Barriers & Cardiac Risk Stratification   Activity Barriers  Muscular Weakness;Deconditioning;Balance Concerns;Shortness of Breath;Other (comment)    Comments  Oxygen use.     Cardiac Risk Stratification  High       6 Minute Walk: 6 Minute Walk    Row Name 12/31/18 1131         6 Minute Walk   Phase  Initial     Distance  200 feet     Walk Time  6 minutes     # of Rest Breaks  3     MPH  0.3     METS  1.48     RPE  15     Perceived Dyspnea   3     Symptoms  Yes (comment)     Comments  fatigue, 2-3/10 pain in chest     Resting HR  87 bpm     Resting BP  98/70     Resting Oxygen Saturation   88 %     Exercise Oxygen Saturation  during 6 min walk  83 %     Max Ex. HR  96 bpm     Max Ex. BP  100/64     2 Minute Post BP  98/62        Oxygen Initial Assessment:   Oxygen Re-Evaluation:   Oxygen Discharge (Final Oxygen Re-Evaluation):   Initial Exercise Prescription: Initial Exercise Prescription - 12/31/18 1100      Date of Initial Exercise RX and Referring Provider   Date  12/31/18    Referring Provider  Dr. Oval Linsey    Expected Discharge Date  04/11/19      Oxygen   Oxygen  Continuous    Liters  8      NuStep   Level  2    SPM  75    Minutes  10    METs  1.3      Prescription Details   Frequency (times per week)  3    Duration  Progress to 30 minutes of continuous aerobic without signs/symptoms of physical distress      Intensity   THRR 40-80%  of Max Heartrate  63-126    Ratings of Perceived Exertion  11-13    Perceived Dyspnea  0-4      Progression   Progression  Continue to progress workloads to maintain intensity without signs/symptoms of physical distress.      Resistance Training   Training Prescription  Yes    Weight  2 lbs.     Reps  10-15       Perform Capillary Blood Glucose checks as needed.  Exercise Prescription Changes:   Exercise Comments:   Exercise Goals and Review:  Exercise Goals    Row Name 12/31/18 1135             Exercise Goals   Increase Physical Activity  Yes       Intervention  Provide advice, education, support and counseling about physical activity/exercise needs.;Develop an individualized exercise prescription for aerobic and resistive training based on initial evaluation findings, risk stratification, comorbidities and participant's personal goals.       Expected Outcomes  Short Term: Attend rehab on a regular basis to increase amount of physical activity.       Increase Strength and Stamina  Yes       Intervention  Provide advice, education, support and counseling about physical activity/exercise needs.;Develop an individualized exercise prescription for aerobic and resistive training  based on initial evaluation findings, risk stratification, comorbidities and participant's personal goals.       Expected Outcomes  Short Term: Increase workloads from initial exercise prescription for resistance, speed, and METs.       Able to understand and use rate of perceived exertion (RPE) scale  Yes       Intervention  Provide education and explanation on how to use RPE scale       Expected Outcomes  Short Term: Able to use RPE daily in rehab to express subjective intensity level;Long Term:  Able to use RPE to guide intensity level when exercising independently       Able to understand and use Dyspnea scale  Yes       Intervention  Provide education and explanation on how to use Dyspnea scale        Expected Outcomes  Short Term: Able to use Dyspnea scale daily in rehab to express subjective sense of shortness of breath during exertion;Long Term: Able to use Dyspnea scale to guide intensity level when exercising independently       Knowledge and understanding of Target Heart Rate Range (THRR)  Yes       Intervention  Provide education and explanation of THRR including how the numbers were predicted and where they are located for reference       Expected Outcomes  Short Term: Able to state/look up THRR;Long Term: Able to use THRR to govern intensity when exercising independently;Short Term: Able to use daily as guideline for intensity in rehab       Able to check pulse independently  Yes       Intervention  Provide education and demonstration on how to check pulse in carotid and radial arteries.;Review the importance of being able to check your own pulse for safety during independent exercise       Expected Outcomes  Short Term: Able to explain why pulse checking is important during independent exercise;Long Term: Able to check pulse independently and accurately       Understanding of Exercise Prescription  Yes       Intervention  Provide education, explanation, and written materials on patient's individual exercise prescription       Expected Outcomes  Short Term: Able to explain program exercise prescription;Long Term: Able to explain home exercise prescription to exercise independently          Exercise Goals Re-Evaluation :   Discharge Exercise Prescription (Final Exercise Prescription Changes):   Nutrition:  Target Goals: Understanding of nutrition guidelines, daily intake of sodium 1500mg , cholesterol 200mg , calories 30% from fat and 7% or less from saturated fats, daily to have 5 or more servings of fruits and vegetables.  Biometrics: Pre Biometrics - 12/31/18 1135      Pre Biometrics   Height  5\' 5"  (1.651 m)    Weight  125 lb 3.5 oz (56.8 kg)    Waist Circumference  35  inches    Hip Circumference  35.5 inches    Waist to Hip Ratio  0.99 %    BMI (Calculated)  20.84    Triceps Skinfold  15 mm    % Body Fat  31.8 %    Grip Strength  31 kg    Flexibility  9 in    Single Leg Stand  7.31 seconds        Nutrition Therapy Plan and Nutrition Goals: Nutrition Therapy & Goals - 12/31/18 1507      Nutrition Therapy  Diet  heart healthy      Personal Nutrition Goals   Nutrition Goal  to be determined      Intervention Plan   Intervention  Prescribe, educate and counsel regarding individualized specific dietary modifications aiming towards targeted core components such as weight, hypertension, lipid management, diabetes, heart failure and other comorbidities.    Expected Outcomes  Short Term Goal: Understand basic principles of dietary content, such as calories, fat, sodium, cholesterol and nutrients.;Long Term Goal: Adherence to prescribed nutrition plan.       Nutrition Assessments: Nutrition Assessments - 12/31/18 1507      MEDFICTS Scores   Pre Score  24       Nutrition Goals Re-Evaluation:   Nutrition Goals Re-Evaluation:   Nutrition Goals Discharge (Final Nutrition Goals Re-Evaluation):   Psychosocial: Target Goals: Acknowledge presence or absence of significant depression and/or stress, maximize coping skills, provide positive support system. Participant is able to verbalize types and ability to use techniques and skills needed for reducing stress and depression.  Initial Review & Psychosocial Screening: Initial Psych Review & Screening - 12/31/18 1423      Initial Review   Current issues with  Current Depression;Current Anxiety/Panic;Current Stress Concerns    Source of Stress Concerns  Chronic Illness;Unable to participate in former interests or hobbies;Unable to perform yard/household activities    Comments  Anette has pulmonary fibrosis in addition to her CAD      Family Dynamics   Good Support System?  Yes   Anupama has her  husband for support   Comments  Anette has a lot of health issues that adds to her depression and stresss      Barriers   Psychosocial barriers to participate in program  The patient should benefit from training in stress management and relaxation.      Screening Interventions   Interventions  Encouraged to exercise;To provide support and resources with identified psychosocial needs;Provide feedback about the scores to participant    Expected Outcomes  Short Term goal: Utilizing psychosocial counselor, staff and physician to assist with identification of specific Stressors or current issues interfering with healing process. Setting desired goal for each stressor or current issue identified.;Long Term Goal: Stressors or current issues are controlled or eliminated.;Short Term goal: Identification and review with participant of any Quality of Life or Depression concerns found by scoring the questionnaire.;Long Term goal: The participant improves quality of Life and PHQ9 Scores as seen by post scores and/or verbalization of changes       Quality of Life Scores: Quality of Life - 12/31/18 1102      Quality of Life   Select  Quality of Life      Quality of Life Scores   Health/Function Pre  5.2 %    Socioeconomic Pre  22.21 %    Psych/Spiritual Pre  4.29 %    Family Pre  25.4 %    GLOBAL Pre  11.46 %      Scores of 19 and below usually indicate a poorer quality of life in these areas.  A difference of  2-3 points is a clinically meaningful difference.  A difference of 2-3 points in the total score of the Quality of Life Index has been associated with significant improvement in overall quality of life, self-image, physical symptoms, and general health in studies assessing change in quality of life.  PHQ-9: Recent Review Flowsheet Data    There is no flowsheet data to display.     Interpretation  of Total Score  Total Score Depression Severity:  1-4 = Minimal depression, 5-9 = Mild  depression, 10-14 = Moderate depression, 15-19 = Moderately severe depression, 20-27 = Severe depression   Psychosocial Evaluation and Intervention:   Psychosocial Re-Evaluation:   Psychosocial Discharge (Final Psychosocial Re-Evaluation):   Vocational Rehabilitation: Provide vocational rehab assistance to qualifying candidates.   Vocational Rehab Evaluation & Intervention: Vocational Rehab - 12/31/18 1437      Initial Vocational Rehab Evaluation & Intervention   Assessment shows need for Vocational Rehabilitation  No   Will give packet to patient for review      Education: Education Goals: Education classes will be provided on a weekly basis, covering required topics. Participant will state understanding/return demonstration of topics presented.  Learning Barriers/Preferences: Learning Barriers/Preferences - 12/31/18 1103      Learning Barriers/Preferences   Learning Barriers  Exercise Concerns   Dizziness   Learning Preferences  Audio;Individual Instruction       Education Topics: Count Your Pulse:  -Group instruction provided by verbal instruction, demonstration, patient participation and written materials to support subject.  Instructors address importance of being able to find your pulse and how to count your pulse when at home without a heart monitor.  Patients get hands on experience counting their pulse with staff help and individually.   Heart Attack, Angina, and Risk Factor Modification:  -Group instruction provided by verbal instruction, video, and written materials to support subject.  Instructors address signs and symptoms of angina and heart attacks.    Also discuss risk factors for heart disease and how to make changes to improve heart health risk factors.   Functional Fitness:  -Group instruction provided by verbal instruction, demonstration, patient participation, and written materials to support subject.  Instructors address safety measures for doing  things around the house.  Discuss how to get up and down off the floor, how to pick things up properly, how to safely get out of a chair without assistance, and balance training.   Meditation and Mindfulness:  -Group instruction provided by verbal instruction, patient participation, and written materials to support subject.  Instructor addresses importance of mindfulness and meditation practice to help reduce stress and improve awareness.  Instructor also leads participants through a meditation exercise.    Stretching for Flexibility and Mobility:  -Group instruction provided by verbal instruction, patient participation, and written materials to support subject.  Instructors lead participants through series of stretches that are designed to increase flexibility thus improving mobility.  These stretches are additional exercise for major muscle groups that are typically performed during regular warm up and cool down.   Hands Only CPR:  -Group verbal, video, and participation provides a basic overview of AHA guidelines for community CPR. Role-play of emergencies allow participants the opportunity to practice calling for help and chest compression technique with discussion of AED use.   Hypertension: -Group verbal and written instruction that provides a basic overview of hypertension including the most recent diagnostic guidelines, risk factor reduction with self-care instructions and medication management.    Nutrition I class: Heart Healthy Eating:  -Group instruction provided by PowerPoint slides, verbal discussion, and written materials to support subject matter. The instructor gives an explanation and review of the Therapeutic Lifestyle Changes diet recommendations, which includes a discussion on lipid goals, dietary fat, sodium, fiber, plant stanol/sterol esters, sugar, and the components of a well-balanced, healthy diet.   Nutrition II class: Lifestyle Skills:  -Group instruction provided  by PowerPoint slides, verbal discussion,  and written materials to support subject matter. The instructor gives an explanation and review of label reading, grocery shopping for heart health, heart healthy recipe modifications, and ways to make healthier choices when eating out.   Diabetes Question & Answer:  -Group instruction provided by PowerPoint slides, verbal discussion, and written materials to support subject matter. The instructor gives an explanation and review of diabetes co-morbidities, pre- and post-prandial blood glucose goals, pre-exercise blood glucose goals, signs, symptoms, and treatment of hypoglycemia and hyperglycemia, and foot care basics.   Diabetes Blitz:  -Group instruction provided by PowerPoint slides, verbal discussion, and written materials to support subject matter. The instructor gives an explanation and review of the physiology behind type 1 and type 2 diabetes, diabetes medications and rational behind using different medications, pre- and post-prandial blood glucose recommendations and Hemoglobin A1c goals, diabetes diet, and exercise including blood glucose guidelines for exercising safely.    Portion Distortion:  -Group instruction provided by PowerPoint slides, verbal discussion, written materials, and food models to support subject matter. The instructor gives an explanation of serving size versus portion size, changes in portions sizes over the last 20 years, and what consists of a serving from each food group.   Stress Management:  -Group instruction provided by verbal instruction, video, and written materials to support subject matter.  Instructors review role of stress in heart disease and how to cope with stress positively.     Exercising on Your Own:  -Group instruction provided by verbal instruction, power point, and written materials to support subject.  Instructors discuss benefits of exercise, components of exercise, frequency and intensity of  exercise, and end points for exercise.  Also discuss use of nitroglycerin and activating EMS.  Review options of places to exercise outside of rehab.  Review guidelines for sex with heart disease.   Cardiac Drugs I:  -Group instruction provided by verbal instruction and written materials to support subject.  Instructor reviews cardiac drug classes: antiplatelets, anticoagulants, beta blockers, and statins.  Instructor discusses reasons, side effects, and lifestyle considerations for each drug class.   Cardiac Drugs II:  -Group instruction provided by verbal instruction and written materials to support subject.  Instructor reviews cardiac drug classes: angiotensin converting enzyme inhibitors (ACE-I), angiotensin II receptor blockers (ARBs), nitrates, and calcium channel blockers.  Instructor discusses reasons, side effects, and lifestyle considerations for each drug class.   Anatomy and Physiology of the Circulatory System:  Group verbal and written instruction and models provide basic cardiac anatomy and physiology, with the coronary electrical and arterial systems. Review of: AMI, Angina, Valve disease, Heart Failure, Peripheral Artery Disease, Cardiac Arrhythmia, Pacemakers, and the ICD.   Other Education:  -Group or individual verbal, written, or video instructions that support the educational goals of the cardiac rehab program.   Holiday Eating Survival Tips:  -Group instruction provided by PowerPoint slides, verbal discussion, and written materials to support subject matter. The instructor gives patients tips, tricks, and techniques to help them not only survive but enjoy the holidays despite the onslaught of food that accompanies the holidays.   Knowledge Questionnaire Score: Knowledge Questionnaire Score - 12/31/18 1102      Knowledge Questionnaire Score   Pre Score  21/24       Core Components/Risk Factors/Patient Goals at Admission: Personal Goals and Risk Factors at  Admission - 12/31/18 1104      Core Components/Risk Factors/Patient Goals on Admission    Weight Management  Yes;Weight Maintenance    Intervention  Weight  Management: Develop a combined nutrition and exercise program designed to reach desired caloric intake, while maintaining appropriate intake of nutrient and fiber, sodium and fats, and appropriate energy expenditure required for the weight goal.;Weight Management: Provide education and appropriate resources to help participant work on and attain dietary goals.    Admit Weight  125 lb 3.5 oz (56.8 kg)    Expected Outcomes  Short Term: Continue to assess and modify interventions until short term weight is achieved;Long Term: Adherence to nutrition and physical activity/exercise program aimed toward attainment of established weight goal;Weight Maintenance: Understanding of the daily nutrition guidelines, which includes 25-35% calories from fat, 7% or less cal from saturated fats, less than 200mg  cholesterol, less than 1.5gm of sodium, & 5 or more servings of fruits and vegetables daily;Understanding recommendations for meals to include 15-35% energy as protein, 25-35% energy from fat, 35-60% energy from carbohydrates, less than 200mg  of dietary cholesterol, 20-35 gm of total fiber daily;Understanding of distribution of calorie intake throughout the day with the consumption of 4-5 meals/snacks    Improve shortness of breath with ADL's  Yes    Intervention  Provide education, individualized exercise plan and daily activity instruction to help decrease symptoms of SOB with activities of daily living.    Expected Outcomes  Short Term: Improve cardiorespiratory fitness to achieve a reduction of symptoms when performing ADLs;Long Term: Be able to perform more ADLs without symptoms or delay the onset of symptoms    Hypertension  Yes    Intervention  Provide education on lifestyle modifcations including regular physical activity/exercise, weight management,  moderate sodium restriction and increased consumption of fresh fruit, vegetables, and low fat dairy, alcohol moderation, and smoking cessation.;Monitor prescription use compliance.    Expected Outcomes  Short Term: Continued assessment and intervention until BP is < 140/50mm HG in hypertensive participants. < 130/57mm HG in hypertensive participants with diabetes, heart failure or chronic kidney disease.;Long Term: Maintenance of blood pressure at goal levels.    Lipids  Yes    Intervention  Provide education and support for participant on nutrition & aerobic/resistive exercise along with prescribed medications to achieve LDL 70mg , HDL >40mg .    Expected Outcomes  Short Term: Participant states understanding of desired cholesterol values and is compliant with medications prescribed. Participant is following exercise prescription and nutrition guidelines.;Long Term: Cholesterol controlled with medications as prescribed, with individualized exercise RX and with personalized nutrition plan. Value goals: LDL < 70mg , HDL > 40 mg.    Stress  Yes    Intervention  Offer individual and/or small group education and counseling on adjustment to heart disease, stress management and health-related lifestyle change. Teach and support self-help strategies.;Refer participants experiencing significant psychosocial distress to appropriate mental health specialists for further evaluation and treatment. When possible, include family members and significant others in education/counseling sessions.    Expected Outcomes  Short Term: Participant demonstrates changes in health-related behavior, relaxation and other stress management skills, ability to obtain effective social support, and compliance with psychotropic medications if prescribed.;Long Term: Emotional wellbeing is indicated by absence of clinically significant psychosocial distress or social isolation.       Core Components/Risk Factors/Patient Goals Review:  Goals  and Risk Factor Review    Row Name 01/09/19 1542             Core Components/Risk Factors/Patient Goals Review   Personal Goals Review  Weight Management/Obesity;Improve shortness of breath with ADL's;Lipids;Hypertension;Stress;Develop more efficient breathing techniques such as purse lipped breathing and diaphragmatic breathing and  practicing self-pacing with activity.       Review  Alaiyah's cardiac rehab is currently on hold due to her respitory issues       Expected Outcomes  Arietta will be able to participate in cardiac rheab when able and then transition to pulmonary rehab          Core Components/Risk Factors/Patient Goals at Discharge (Final Review):  Goals and Risk Factor Review - 01/09/19 1542      Core Components/Risk Factors/Patient Goals Review   Personal Goals Review  Weight Management/Obesity;Improve shortness of breath with ADL's;Lipids;Hypertension;Stress;Develop more efficient breathing techniques such as purse lipped breathing and diaphragmatic breathing and practicing self-pacing with activity.    Review  Zailyn's cardiac rehab is currently on hold due to her respitory issues    Expected Outcomes  Janiyla will be able to participate in cardiac rheab when able and then transition to pulmonary rehab       ITP Comments: ITP Comments    Row Name 12/31/18 1127 01/09/19 1546         ITP Comments  Dr. Fransico Him, Medical Director  30 Day ITP comments. Viviann's cardiac rehab is currently on hold.         Comments: See ITP comments.Barnet Pall, RN,BSN 01/09/2019 3:48 PM

## 2019-01-09 NOTE — Telephone Encounter (Signed)
Requested medication (s) are due for refill today: yes  Requested medication (s) are on the active medication list: yes  Last refill:  08/23/18  Future visit scheduled: yes  Notes to clinic:  Last refill states need to keep appointment. It was canceled. Need PHQ-2 PHQ-9    Requested Prescriptions  Pending Prescriptions Disp Refills   buPROPion (WELLBUTRIN XL) 300 MG 24 hr tablet 90 tablet 0    Sig: Take 1 tablet (300 mg total) by mouth every morning. Must keep upcoming appt to get refills     Psychiatry: Antidepressants - bupropion Failed - 01/09/2019  1:20 PM      Failed - Completed PHQ-2 or PHQ-9 in the last 360 days.      Failed - Valid encounter within last 6 months    Recent Outpatient Visits          1 year ago Need for pneumococcal vaccination   [facility] Plot[facility], Evie Lacks, MD   1 year ago Essential hypertension   [facility], Evie Lacks, MD   1 year ago Well adult exam   [facility] -[facility] Plotnikov, Evie Lacks, MD   2 years ago Asthmatic bronchitis, mild intermittent, uncomplicated   [place], Evie Lacks, MD   2 years ago Skin irritation   [facility], [place], RN      Future Appointments            In 1 month Rigoberto Noel, MD [facility]   In 1 month Plotnikov, Evie Lacks, MD [place], [place] BP in normal range    BP Readings from Last 1 Encounters:  12/31/18 98/70

## 2019-01-10 ENCOUNTER — Telehealth: Payer: Self-pay | Admitting: Cardiovascular Disease

## 2019-01-10 ENCOUNTER — Other Ambulatory Visit: Payer: Self-pay | Admitting: Internal Medicine

## 2019-01-10 ENCOUNTER — Ambulatory Visit (HOSPITAL_COMMUNITY): Payer: 59

## 2019-01-10 MED ORDER — LORAZEPAM 0.5 MG PO TABS: 0.5000 mg | ORAL_TABLET | Freq: Every evening | ORAL | 2 refills | Status: DC | PRN

## 2019-01-10 NOTE — Telephone Encounter (Signed)
Copied from [place] [id number]. Topic: Quick Communication - See Telephone Encounter >> Jan 10, 2019  4:29 PM Loma Boston wrote: CRM for notification. See Telephone encounter for: 01/10/19.buPROPion (WELLBUTRIN XL) 300 MG 24 hr tablet / must be called in mail order and is running out LORazepam (ATIVAN) 0.5 MG tablet needs to be called [facility] in [place]. She says had open heart surgery has been in hosptial for a month and can barely speak, she says she can't keep calling back she is feeling too poorly Questions [phone]

## 2019-01-10 NOTE — Telephone Encounter (Signed)
Pt would like a call back regarding medications. Please advise. GF#[phone]

## 2019-01-10 NOTE — Telephone Encounter (Signed)
OK. Thx

## 2019-01-10 NOTE — Telephone Encounter (Signed)
Patient says Wellbutrin will need to be called in to [facility].

## 2019-01-10 NOTE — Telephone Encounter (Signed)
Spoke with Verdis Frederickson RN with Cardiac Rehab. Patient continues to be treated for infection and has received oxymyzer pendant. patient has an appointment with ID 2/18 and Dr Oval Linsey 2/26. Will await these appointments before returning to Cardiac Rehab.

## 2019-01-10 NOTE — Addendum Note (Signed)
Addended by: Cassandria Anger on: 01/10/2019 03:59 PM   Modules accepted: Orders

## 2019-01-10 NOTE — Telephone Encounter (Signed)
Copied from [place] [phone]. Topic: Quick Communication - See Telephone Encounter >> Jan 10, 2019  4:29 PM Loma Boston wrote: CRM for notification. See Telephone encounter for: 01/10/19.buPROPion (WELLBUTRIN XL) 300 MG 24 hr tablet / must be called in mail order and is running out LORazepam (ATIVAN) 0.5 MG tablet needs to be called [facility] in [place]. She says had open heart surgery has been in hosptial for a month and can barely speak, she says she can't keep calling back she is feeling too poorly Questions [phone]

## 2019-01-13 ENCOUNTER — Telehealth: Payer: Self-pay | Admitting: Pulmonary Disease

## 2019-01-13 ENCOUNTER — Ambulatory Visit (HOSPITAL_COMMUNITY): Payer: 59

## 2019-01-13 NOTE — Telephone Encounter (Signed)
Pt stated to ask Inez Catalina to call in a 90 day supply of the [facility] instead of 30 because she pays the same copay amount

## 2019-01-13 NOTE — Telephone Encounter (Signed)
Spoke with pt. States that her breathing has gotten worse and feels like her prednisone dose needs to be increased. Reports increased shortness of breath, chest tightness, wheezing and coughing. Cough is non productive. Denies fever/chills/sweats. Pt has called several times over the past few weeks about not feeling well. She has been instructed to come in for an appointment several times and refuses. I have advised the pt that she should come in for an appointment again today and she states, "I'm just too sick to come in." She has been advised that Dr. Elsworth Soho is not available at this time to address her message and she needs to come in for an appointment. Pt again refused and stated that she would adjust her prednisone herself. I strongly advised the pt not to do this. Pt stated that she will call back later this week and see if Dr. Elsworth Soho is back in the office. I have made my direct clinical supervisor, Norva Karvonen aware of this conversation.

## 2019-01-14 ENCOUNTER — Other Ambulatory Visit: Payer: Self-pay | Admitting: Pharmacist Clinician (PhC)/ Clinical Pharmacy Specialist

## 2019-01-14 ENCOUNTER — Telehealth: Payer: Self-pay

## 2019-01-14 ENCOUNTER — Ambulatory Visit: Payer: 59 | Admitting: Pulmonary Disease

## 2019-01-14 ENCOUNTER — Ambulatory Visit (INDEPENDENT_AMBULATORY_CARE_PROVIDER_SITE_OTHER): Payer: 59 | Admitting: Pharmacist Clinician (PhC)/ Clinical Pharmacy Specialist

## 2019-01-14 VITALS — BP 98/64 | HR 71 | Resp 15 | Ht 66.0 in | Wt 122.4 lb

## 2019-01-14 DIAGNOSIS — E782 Mixed hyperlipidemia: Secondary | ICD-10-CM

## 2019-01-14 MED ORDER — BUPROPION HCL ER (XL) 300 MG PO TB24: 300.0000 mg | ORAL_TABLET | Freq: Every morning | ORAL | 1 refills | Status: DC

## 2019-01-14 NOTE — Telephone Encounter (Signed)
Called patient. She was resting at the time of call but her husband answered the phone. He verbalized understanding about results. Noticed where patient had called yesterday stating that she was not feeling well. Marcello Moores stated that he could not tell a difference, just that the patient was tired and depressed that she is not feeling better. She has been using the Neti-Pot for her sinuses but it takes so much of her energy.   Marcello Moores stated that he would have the patient call back later today if she felt like it.

## 2019-01-14 NOTE — Telephone Encounter (Signed)
-----   Message from Rigoberto Noel, MD sent at 01/10/2019  1:44 PM EST ----- Please let patient know that LFTs have come down to near normal range  RA ----- Message ----- From: Harold Hedge, CMA Sent: 01/07/2019   7:49 AM EST To: Rigoberto Noel, MD  Please review with patient

## 2019-01-14 NOTE — Progress Notes (Signed)
01/15/2019 Sarah Escobar [birth year] [id number]   HPI:  Sarah Escobar is a 63 y.o. female patient of Dr Oval Linsey, who presents today for a lipid clinic evaluation.  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  Complications with respiratory failure, PAF and volume overload kept her hospitalized until November 20.  She was put on amiodarone for the PAF, but caused an exacerbation of her interstitial lung disease and was discontinued.  She also had hemoptysis and with a CHADS2-VASc score of 3, was felt to be better off without anticoagulation.   At one point during hospitalization her LFTs increased to > 3 x UNL and her simvastatin was put on hold until this resolved.     Current Medications:  None, previously on simvastatin, had recent increase in LFTs to > 3 x UNL while in hospital  Cholesterol Goals:   LDL < 70  Intolerant/previously tried:  Previously on simvastatin, held due to elevated LFTs while hospitalized,  Family history:   Father first MI at 42, several more, CABG surgery x 2, died from stroke at second CABG - age 59  Mother had liver disease, died at 69  7 siblings - 1 sister with MI/CABG before age 54  3 brothers deceased - no heart disease  1 daughter in good health  Diet:   Drinking Ensure as supplement, has been trying to gain weight since hospitalization  Exercise:    Started cardiac rehab, but had to hold off for now due to lung infection, needs clearance from Dr. Oval Linsey to continue  Labs:   01/2019:  TC 198, TG 71, HDL 50, LDL 134, AST 26, ALT 46    Current Outpatient Medications  Medication Sig Dispense Refill  . Biotin 5000 MCG CAPS Take 5,000 mcg by mouth daily.     Marland Kitchen buPROPion (WELLBUTRIN XL) 300 MG 24 hr tablet Take 1 tablet (300 mg total) by mouth every morning. Must keep upcoming appt to get refills 90 tablet 1  . Cholecalciferol 2000 units TABS Take 2,000 Units by mouth daily.     . ciprofloxacin (CIPRO) 500 MG tablet  Take 1 tablet (500 mg total) by mouth 2 (two) times daily. 20 tablet 0  . digoxin (LANOXIN) 0.125 MG tablet Take 1 tablet (0.125 mg total) by mouth daily. 30 tablet 3  . estrogen-methylTESTOSTERone 0.625-1.25 MG tablet Take 1 tablet by mouth daily.    . feeding supplement, ENSURE ENLIVE, (ENSURE ENLIVE) LIQD Take 237 mLs by mouth 2 (two) times daily between meals. (Patient taking differently: Take 237 mLs by mouth 3 (three) times a week. ) 237 mL 12  . fexofenadine (ALLEGRA) 180 MG tablet Take 180 mg by mouth daily.    . finasteride (PROSCAR) 5 MG tablet Take 2.5 mg by mouth daily.     . Flaxseed, Linseed, (FLAXSEED OIL) 1000 MG CAPS Take 1,000 mg by mouth 2 (two) times daily.     . fluticasone (FLONASE) 50 MCG/ACT nasal spray USE 1 SPRAY IN EACH NOSTRIL TWO TIMES A DAY AS NEEDED (Patient taking differently: Place 1 spray into both nostrils 2 (two) times daily as needed for allergies. ) 48 g 3  . fluticasone furoate-vilanterol (BREO ELLIPTA) 100-25 MCG/INH AEPB USE 1 INHALATION DAILY (Patient taking differently: Inhale 1 puff into the lungs daily. ) 180 each 3  . guaiFENesin (MUCINEX) 600 MG 12 hr tablet Take 2 tablets (1,200 mg total) by mouth 2 (two) times daily. With full glass of water  60 tablet 0  . Ipratropium-Albuterol (COMBIVENT RESPIMAT) 20-100 MCG/ACT AERS respimat Inhale 1 puff into the lungs 4 (four) times daily as needed for wheezing. Overdue for annual appt must see provider for refills 3 Inhaler 0  . ipratropium-albuterol (DUONEB) 0.5-2.5 (3) MG/3ML SOLN Take 3 mLs by nebulization every 8 (eight) hours. 360 mL 2  . levalbuterol (XOPENEX) 1.25 MG/0.5ML nebulizer solution Take 1.25 mg by nebulization every 6 (six) hours as needed for wheezing or shortness of breath. 30 each 1  . LORazepam (ATIVAN) 0.5 MG tablet Take 1-2 tablets (0.5-1 mg total) by mouth at bedtime as needed for anxiety or sleep. Patient needs office visit before refills will be given 60 tablet 2  . metoprolol tartrate  (LOPRESSOR) 25 MG tablet Take 1 tablet (25 mg total) by mouth 2 (two) times daily. 60 tablet 3  . Multiple Vitamins-Minerals (ONE-A-DAY 50 PLUS PO) Take 1 tablet by mouth daily.     . potassium chloride SA (K-DUR,KLOR-CON) 10 MEQ tablet Take 1 tablet (10 mEq total) by mouth daily. 30 tablet 3  . predniSONE (DELTASONE) 10 MG tablet Increase to 40mg dailyx1week,30mg dailyx1week,then stay on 20mg daily 60 tablet 0  . Respiratory Therapy Supplies (FLUTTER) DEVI Use a directed. 1 each 0  . traMADol (ULTRAM) 50 MG tablet Take 1-2 tablets (50-100 mg total) by mouth every 6 (six) hours as needed for moderate pain. 28 tablet 0  . azithromycin (ZITHROMAX) 500 MG tablet Take 3 times (M,W,F)per week for 4-8 weeks (Patient not taking: Reported on 01/14/2019) 12 tablet 1  . furosemide (LASIX) 20 MG tablet TAKE 1 TABLET BY MOUTH ONCE DAILY 30 tablet 0  . minoxidil (ROGAINE) 2 % external solution Apply 1 application topically as needed (for hair loss).     . rosuvastatin (CRESTOR) 10 MG tablet Take 1 tablet (10 mg total) by mouth daily. 30 tablet 10   No current facility-administered medications for this visit.     Allergies  Allergen Reactions  . Flagyl [Metronidazole] Other (See Comments)    Nerve pain  . Levofloxacin Rash  . Loperamide Hcl Rash  . Neomycin-Bacitracin Zn-Polymyx Rash  . Ofloxacin Rash  . Sulfonamide Derivatives Hives  . Band-Aid Liquid Bandage [New Skin] Other (See Comments)    Regular band-aid cause itching and redness  . Collodion Dermatitis  . Gabapentin Other (See Comments)    Mental status changes  . Latex Other (See Comments) and Dermatitis    "redness"  . Sulfa Antibiotics Hives  . Oxycontin [Oxycodone Hcl] Rash         Past Medical History:  Diagnosis Date  . Allergy   . Asthma    adult onset  . Coronary artery disease   . Depression   . Hyperlipidemia    LDL goal = < 100  . Hypertension   . Osteopenia    mild  . Personal history of adenomatous colonic  polyps/FHx colon cancer sister and father    [name]  . Sciatica of left side    intermittent    Blood pressure 98/64, pulse 71, resp. rate 15, height 5\' 6"  (1.676 m), weight 122 lb 6.4 oz (55.5 kg).   HYPERLIPIDEMIA Patient with CAD s/p CABG and elevated LDL cholesterol, now at 134 since discontinuing simvastatin.  It appears that her increase in LFT was during hospitalization and not caused by simvastatin use.  Her LDL was at 70 on admit, so will probably need to shoot for lower LDL goal. Reviewed labs with patient and will start her  on rosuvastatin 10 mg daily.  Will repeat hepatic function in 3 weeks to be sure that the statin will not irritate her liver.  After 10-12 weeks will get repeat lipid panel.  At that point if her liver status is fine, will probably need to bump her to a higher dose, but I would rather make sure she tolerates the medication before doing so.     Tommy Medal PharmD CPP [facility]

## 2019-01-14 NOTE — Telephone Encounter (Signed)
LVM and informed patient that both medications were called in

## 2019-01-14 NOTE — Patient Instructions (Signed)
Get labs drawn today to check cholesterol.  Once we get those results we will determine the next course of action.  Repatha or ezetimibe.   Cholesterol  Cholesterol is a fat. Your body needs a small amount of cholesterol. Cholesterol (plaque) may build up in your blood vessels (arteries). That makes you more likely to have a heart attack or stroke. You cannot feel your cholesterol level. Having a blood test is the only way to find out if your level is high. Keep your test results. Work with your doctor to keep your cholesterol at a good level. What do the results mean?  Total cholesterol is how much cholesterol is in your blood.  LDL is bad cholesterol. This is the type that can build up. Try to have low LDL.  HDL is good cholesterol. It cleans your blood vessels and carries LDL away. Try to have high HDL.  Triglycerides are fat that the body can store or burn for energy. What are good levels of cholesterol?  Total cholesterol below 200.  LDL below 100 is good for people who have health risks. LDL below 70 is good for people who have very high risks.  HDL above 40 is good. It is best to have HDL of 60 or higher.  Triglycerides below 150. How can I lower my cholesterol? Diet Follow your diet program as told by your doctor.  Choose fish, white meat chicken, or [place] that is roasted or baked. Try not to eat red meat, fried foods, sausage, or lunch meats.  Eat lots of fresh fruits and vegetables.  Choose whole grains, beans, pasta, potatoes, and cereals.  Choose olive oil, corn oil, or canola oil. Only use small amounts.  Try not to eat butter, mayonnaise, shortening, or palm kernel oils.  Try not to eat foods with trans fats.  Choose low-fat or nonfat dairy foods. ? Drink skim or nonfat milk. ? Eat low-fat or nonfat yogurt and cheeses. ? Try not to drink whole milk or cream. ? Try not to eat ice cream, egg yolks, or full-fat cheeses.  Healthy desserts include angel food  cake, ginger snaps, animal crackers, hard candy, popsicles, and low-fat or nonfat frozen yogurt. Try not to eat pastries, cakes, pies, and cookies.  Exercise Follow your exercise program as told by your doctor.  Be more active. Try gardening, walking, and taking the stairs.  Ask your doctor about ways that you can be more active. Medicine  Take over-the-counter and prescription medicines only as told by your doctor. This information is not intended to replace advice given to you by your health care provider. Make sure you discuss any questions you have with your health care provider. Document Released: 02/16/2009 Document Revised: 06/21/2016 Document Reviewed: 06/01/2016 Elsevier Interactive Patient Education  2019 [employer].

## 2019-01-15 ENCOUNTER — Ambulatory Visit (HOSPITAL_COMMUNITY): Payer: 59

## 2019-01-15 ENCOUNTER — Encounter: Payer: Self-pay | Admitting: Pharmacist Clinician (PhC)/ Clinical Pharmacy Specialist

## 2019-01-15 ENCOUNTER — Other Ambulatory Visit: Payer: Self-pay | Admitting: Pulmonary Disease

## 2019-01-15 LAB — LIPID PANEL
Chol/HDL Ratio: 4 ratio (ref 0.0–4.4)
Cholesterol, Total: 198 mg/dL (ref 100–199)
HDL: 50 mg/dL (ref >39)
LDL Calculated: 134 mg/dL — ABNORMAL HIGH (ref 0–99)
Triglycerides: 71 mg/dL (ref 0–149)
VLDL Cholesterol Cal: 14 mg/dL (ref 5–40)

## 2019-01-15 MED ORDER — ROSUVASTATIN CALCIUM 10 MG PO TABS: 10.0000 mg | ORAL_TABLET | Freq: Every day | ORAL | 10 refills | Status: AC

## 2019-01-15 NOTE — Telephone Encounter (Signed)
Called and spoke with patient, she stated that she is supposed to be on 6 liters when ambulating but she is having to wear it on 8 or 9 liters. Patient also stated that she is supposed to be on 3 liters when resting or sleeping but she is having to use 4 to 5 liters while doing so. Patient stated that the smallest movement causes her to get very [facility] and it takes forever to get back to baseline and feel normal. Patient also stated that when she is sleeping or resting if she turns over on her side her o2 sats drop from 94% to low 80's. she has a struggle getting her breathing even going to the bathroom or brushing her teeth. Patient wanted to know if she could increase the dosage of her prednisone to see if this would help with her breathing. Patient is upset that she is not getting any better. No other concerns or questions. RA please advise, thank you.

## 2019-01-15 NOTE — Telephone Encounter (Signed)
Pt is returning call requesting to speak to nurse. Cb is [phone]

## 2019-01-15 NOTE — Telephone Encounter (Signed)
She can increase to 40 mg daily for 1 week and then back down to her usual dose. Her appointment at [facility] should be coming up soon. If worse, she will need to go to the hospital

## 2019-01-15 NOTE — Telephone Encounter (Signed)
Called and spoke with Patients Husband Marcello Moores, and Patient.  Dr Elsworth Soho recommendations given to both Patient and Marcello Moores.  Understanding stated.  Nothing further at this time.  Rigoberto Noel, MD   4:34 PM  Note    She can increase to 40 mg daily for 1 week and then back down to her usual dose. Her appointment at [facility] should be coming up soon. If worse, she will need to go to the hospital

## 2019-01-15 NOTE — Assessment & Plan Note (Addendum)
Patient with CAD s/p CABG and elevated LDL cholesterol, now at 134 since discontinuing simvastatin.  It appears that her increase in LFT was during hospitalization and not caused by simvastatin use.  Her LDL was at 70 on admit, so will probably need to shoot for lower LDL goal. Reviewed labs with patient and will start her on rosuvastatin 10 mg daily.  Will repeat hepatic function in 3 weeks to be sure that the statin will not irritate her liver.  After 10-12 weeks will get repeat lipid panel.  At that point if her liver status is fine, will probably need to bump her to a higher dose, but I would rather make sure she tolerates the medication before doing so.

## 2019-01-16 ENCOUNTER — Other Ambulatory Visit: Payer: Self-pay | Admitting: Pulmonary Disease

## 2019-01-16 ENCOUNTER — Telehealth: Payer: Self-pay | Admitting: Pulmonary Disease

## 2019-01-16 MED ORDER — LEVALBUTEROL HCL 1.25 MG/0.5ML IN NEBU: 1.2500 mg | INHALATION_SOLUTION | Freq: Four times a day (QID) | RESPIRATORY_TRACT | 3 refills | Status: DC | PRN

## 2019-01-16 NOTE — Telephone Encounter (Signed)
Spoke with patient. She stated that she needed a refill on her Xopenex neb solution as well as her prednisone. RA has increased her prednisone to 4 tablets daily for a week and she is out. Advised her that I would send in these medications for her. She verbalized understanding. Nothing further needed at time of call.

## 2019-01-17 ENCOUNTER — Ambulatory Visit (HOSPITAL_COMMUNITY): Payer: 59

## 2019-01-20 ENCOUNTER — Ambulatory Visit (HOSPITAL_COMMUNITY): Payer: 59

## 2019-01-21 ENCOUNTER — Ambulatory Visit: Payer: 59 | Admitting: Internal Medicine

## 2019-01-22 ENCOUNTER — Telehealth: Payer: Self-pay | Admitting: Pulmonary Disease

## 2019-01-22 ENCOUNTER — Ambulatory Visit (HOSPITAL_COMMUNITY): Payer: 59

## 2019-01-22 NOTE — Telephone Encounter (Signed)
Spoke with patient. She is currently out of work on short-term disability. She stated that she was scheduled to return back to work on 2/24 and work from home. She was notified yesterday that that wouldn't be the case. Her employer is requiring her to come into the office to work. She doesn't feel that she is able to do this with the high amount of O2 that she is currently using.   She wants to know if RA thinks she would be a good candidate for long term disability and applying for Medicare.   Advised patient that she wants to apply for long term disability she will need to let her employer know so that the proper paperwork can be filled out. She verbalized understanding.   RA, please advise. Thanks!

## 2019-01-23 ENCOUNTER — Ambulatory Visit: Payer: 59 | Admitting: Internal Medicine

## 2019-01-23 DIAGNOSIS — J9611 Chronic respiratory failure with hypoxia: Secondary | ICD-10-CM | POA: Diagnosis not present

## 2019-01-23 DIAGNOSIS — R0602 Shortness of breath: Secondary | ICD-10-CM | POA: Diagnosis not present

## 2019-01-23 DIAGNOSIS — J9621 Acute and chronic respiratory failure with hypoxia: Secondary | ICD-10-CM | POA: Diagnosis not present

## 2019-01-23 NOTE — Telephone Encounter (Signed)
Called pt and advised message from the provider. Pt understood and verbalized understanding. Nothing further is needed.    

## 2019-01-23 NOTE — Telephone Encounter (Signed)
She should consider application for long-term disability

## 2019-01-24 ENCOUNTER — Ambulatory Visit (HOSPITAL_COMMUNITY): Payer: 59

## 2019-01-24 ENCOUNTER — Ambulatory Visit: Payer: 59 | Admitting: Cardiothoracic Surgery

## 2019-01-27 ENCOUNTER — Other Ambulatory Visit: Payer: 59

## 2019-01-27 ENCOUNTER — Ambulatory Visit (INDEPENDENT_AMBULATORY_CARE_PROVIDER_SITE_OTHER): Payer: 59 | Admitting: Pulmonary Disease

## 2019-01-27 ENCOUNTER — Ambulatory Visit (HOSPITAL_COMMUNITY): Payer: 59

## 2019-01-27 ENCOUNTER — Encounter: Payer: Self-pay | Admitting: Pulmonary Disease

## 2019-01-27 DIAGNOSIS — J9611 Chronic respiratory failure with hypoxia: Secondary | ICD-10-CM

## 2019-01-27 DIAGNOSIS — A319 Mycobacterial infection, unspecified: Secondary | ICD-10-CM | POA: Diagnosis not present

## 2019-01-27 DIAGNOSIS — J849 Interstitial pulmonary disease, unspecified: Secondary | ICD-10-CM

## 2019-01-27 DIAGNOSIS — J189 Pneumonia, unspecified organism: Secondary | ICD-10-CM

## 2019-01-27 DIAGNOSIS — R05 Cough: Secondary | ICD-10-CM

## 2019-01-27 DIAGNOSIS — R059 Cough, unspecified: Secondary | ICD-10-CM

## 2019-01-27 NOTE — Patient Instructions (Signed)
Increase Lasix to 40 mg daily for 5 days, then back down to 20 mg daily. Stay on prednisone 40 mg daily   We will put in an application for new medication O FEV   We will give you letter to take you out of work  Please obtain from medical records-CD with your CT scans to take with you for your [facility] appointment. Printout of biopsy report given

## 2019-01-27 NOTE — Assessment & Plan Note (Signed)
Increase Lasix to 40 mg daily for 5 days, then back down to 20 mg daily. Stay on prednisone 40 mg daily   We will put in an application for new medication O FEV -for fibrotic lung disease   We will give you letter to take you out of work

## 2019-01-27 NOTE — Assessment & Plan Note (Signed)
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

## 2019-01-27 NOTE — Assessment & Plan Note (Addendum)
Persistent Serratia-we will obtain ID consultation.

## 2019-01-27 NOTE — Progress Notes (Signed)
Subjective:    Patient ID: Sarah Escobar, female    DOB: [birth year], 63 y.o.   MRN: [id number]  HPI  62yoex-smokerfor FU of ILD ,asthmatic bronchitis &bronchiectasis, hypersensitive to perfumes. She smoked 1 PPD for about 30-pack-years before quitting in 2003. 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 Cough    Per patient cough is getting worse, especially at night.   She appears worse since her last visit She now requires about 7 L oxygen continuously and more on exertion.  Even the simplest daily activities seem to wear out and she takes a long time to recover her saturations Sputum culture after her last visit 12/2017 showed Serratia again-she had been treated with 14 days of IV ceftriaxone during her hospitalization 09/2018 and we treated her again with 7 days of ciprofloxacin She has had to increase her prednisone back up to 40 mg. She also reports increased bipedal edema  Coughing is also worsened and she has to sleep upright  LFTs reviewed from last visit show near normal enzymes-AST is 26 and ALT has decreased to 46  She needs letter for work, needs advice on long-term disability and medical insurance issues   Significant tests/ events reviewed  PFT 04/2014 - no obstruction, preserved lung volumes, DLCO 76%   10/2016Spirometry >> ratio 90, FEV 81%, FVC  71%  2/2019spirometry showed slight drop in FEV1 to 70 and ratio 88>>moderate restriction 09/2018 spirometry ratio 91, FEV1 71%, FVC 60%  HRCT 02/2014- Nonspecific pattern of interstitial lung disease with bronchiectasis, subpleural reticulation and architectural distortion. There are minor areas of alveolitis in the left upper and lower lobes Serology neg  hypersens panel neg  07/2015 CT sinuses neg 02/2018 HRCT >> slight worse,favor chr hypersens pneumonitis, NOT UIP 09/2018 HRCT favor chronic HP  Past Medical History:  Diagnosis Date  . Allergy   . Asthma    adult onset  . Coronary artery disease   . Depression   . Hyperlipidemia    LDL goal = < 100  . Hypertension   . Osteopenia    mild  . Personal history of adenomatous colonic polyps/FHx colon cancer sister and father    [name]  . Sciatica of left side    intermittent      Review of Systems neg for any significant sore throat, dysphagia, itching, sneezing, nasal congestion or excess/ purulent secretions, fever, chills, sweats, unintended wt loss, pleuritic or exertional cp, hempoptysis, orthopnea pnd or change in chronic leg swelling. Also denies presyncope, palpitations, heartburn, abdominal pain, nausea, vomiting, diarrhea or change in bowel or urinary habits, dysuria,hematuria, rash, arthralgias, visual complaints, headache, numbness weakness or ataxia.     Objective:   Physical Exam   Gen. Pleasant, well-nourished, in no distress, normal affect ENT - no pallor,icterus, no post nasal drip Neck: No JVD, no thyromegaly, no carotid bruits Lungs: no use of accessory muscles, no dullness to percussion, BL rales 2/3 no rhonchi  Cardiovascular: Rhythm regular, heart sounds  normal, no  murmurs or gallops, 2+ peripheral ankle edema Abdomen: soft and non-tender, no hepatosplenomegaly, BS normal. Musculoskeletal: No deformities, no cyanosis or clubbing Neuro:  alert, non focal        Assessment & Plan:

## 2019-01-27 NOTE — Assessment & Plan Note (Signed)
Seems to require 7 L O2 at rest and up to 10 L on exertion. We will also need lung transplant referral

## 2019-01-28 ENCOUNTER — Telehealth: Payer: Self-pay | Admitting: Pulmonary Disease

## 2019-01-28 ENCOUNTER — Other Ambulatory Visit: Payer: Self-pay | Admitting: Internal Medicine

## 2019-01-28 ENCOUNTER — Other Ambulatory Visit: Payer: Self-pay | Admitting: Pulmonary Disease

## 2019-01-28 ENCOUNTER — Encounter: Payer: Self-pay | Admitting: Internal Medicine

## 2019-01-28 ENCOUNTER — Ambulatory Visit (INDEPENDENT_AMBULATORY_CARE_PROVIDER_SITE_OTHER): Payer: 59 | Admitting: Internal Medicine

## 2019-01-28 VITALS — BP 87/56 | HR 56

## 2019-01-28 DIAGNOSIS — J479 Bronchiectasis, uncomplicated: Secondary | ICD-10-CM

## 2019-01-28 NOTE — Progress Notes (Signed)
[facility]      Reason for Consult: positive culture for Mycobacteria porcinum and Serratia  Referring Physician: Dr. Elsworth Soho    Patient ID: Sarah Escobar, female    DOB: 05-10-1956, 63 y.o.   MRN: [id number]  HPI:   Here for evaluation of above.   She has a history of smoking for about 30 years and repeated sinus infections, sinus surgery, CABG in October 2019 with severe hypoxia post op requiring steroids.  She was found to have Serratia in her sputum in November 2019 and treated with cipro for 10 days.  She continues to decline.  CXRs do not show a discrete opacity.  Also grew Mycobacteria porcinum x 1 in sputum.  She gets significantly hypoxic with any activity.  Going to [facility] this week for transplant evaluation.   Previous record reviewed in Epic and summarized above.   Past Medical History:  Diagnosis Date  . Allergy   . Asthma    adult onset  . Coronary artery disease   . Depression   . Hyperlipidemia    LDL goal = < 100  . Hypertension   . Osteopenia    mild  . Personal history of adenomatous colonic polyps/FHx colon cancer sister and father    [name]  . Sciatica of left side    intermittent    Prior to Admission medications   Medication Sig Start Date End Date Taking? Authorizing Provider  azithromycin (ZITHROMAX) 500 MG tablet Take 3 times (M,W,F)per week for 4-8 weeks 12/31/18   Rigoberto Noel, MD  Biotin 5000 MCG CAPS Take 5,000 mcg by mouth daily.     [provider]  buPROPion (WELLBUTRIN XL) 300 MG 24 hr tablet Take 1 tablet (300 mg total) by mouth every morning. Must keep upcoming appt to get refills 01/14/19   Plotnikov, Evie Lacks, MD  Cholecalciferol 2000 units TABS Take 2,000 Units by mouth daily.     [provider]  ciprofloxacin (CIPRO) 500 MG tablet Take 1 tablet (500 mg total) by mouth 2 (two) times daily. 01/07/19   Rigoberto Noel, MD  digoxin (LANOXIN) 0.125 MG tablet Take 1 tablet (0.125 mg total) by  mouth daily. 10/24/18   Barrett, Erin R, PA-C  estrogen-methylTESTOSTERone 0.625-1.25 MG tablet Take 1 tablet by mouth daily.    [provider]  feeding supplement, ENSURE ENLIVE, (ENSURE ENLIVE) LIQD Take 237 mLs by mouth 2 (two) times daily between meals. Patient taking differently: Take 237 mLs by mouth 3 (three) times a week.  05/12/15   Hendricks Limes, MD  fexofenadine (ALLEGRA) 180 MG tablet Take 180 mg by mouth daily.    [provider]  finasteride (PROSCAR) 5 MG tablet Take 2.5 mg by mouth daily.     [provider]  Flaxseed, Linseed, (FLAXSEED OIL) 1000 MG CAPS Take 1,000 mg by mouth 2 (two) times daily.     [provider]  fluticasone (FLONASE) 50 MCG/ACT nasal spray USE 1 SPRAY IN EACH NOSTRIL TWO TIMES A DAY AS NEEDED Patient taking differently: Place 1 spray into both nostrils 2 (two) times daily as needed for allergies.  12/07/17   Plotnikov, Evie Lacks, MD  fluticasone furoate-vilanterol (BREO ELLIPTA) 100-25 MCG/INH AEPB USE 1 INHALATION DAILY Patient taking differently: Inhale 1 puff into the lungs daily.  03/26/18   Rigoberto Noel, MD  furosemide (LASIX) 20 MG tablet TAKE 1 TABLET BY MOUTH ONCE DAILY 01/15/19   Kara Mead  V, MD  guaiFENesin (MUCINEX) 600 MG 12 hr tablet Take 2 tablets (1,200 mg total) by mouth 2 (two) times daily. With full glass of water 10/23/18   Barrett, Erin R, PA-C  Ipratropium-Albuterol (COMBIVENT RESPIMAT) 20-100 MCG/ACT AERS respimat Inhale 1 puff into the lungs 4 (four) times daily as needed for wheezing. Overdue for annual appt must see provider for refills 11/21/18   Plotnikov, Evie Lacks, MD  ipratropium-albuterol (DUONEB) 0.5-2.5 (3) MG/3ML SOLN Take 3 mLs by nebulization every 8 (eight) hours. 10/23/18   Barrett, Erin R, PA-C  levalbuterol (XOPENEX) 1.25 MG/0.5ML nebulizer solution Take 1.25 mg by nebulization every 6 (six) hours as needed for wheezing or shortness of breath. 01/16/19   Rigoberto Noel, MD  LORazepam  (ATIVAN) 0.5 MG tablet Take 1-2 tablets (0.5-1 mg total) by mouth at bedtime as needed for anxiety or sleep. Patient needs office visit before refills will be given 01/10/19   Plotnikov, Evie Lacks, MD  metoprolol tartrate (LOPRESSOR) 25 MG tablet Take 1 tablet (25 mg total) by mouth 2 (two) times daily. 10/23/18   Barrett, Erin R, PA-C  minoxidil (ROGAINE) 2 % external solution Apply 1 application topically as needed (for hair loss).     [provider]  Multiple Vitamins-Minerals (ONE-A-DAY 50 PLUS PO) Take 1 tablet by mouth daily.     [provider]  potassium chloride SA (K-DUR,KLOR-CON) 10 MEQ tablet Take 1 tablet (10 mEq total) by mouth daily. 10/24/18   Barrett, Erin R, PA-C  predniSONE (DELTASONE) 10 MG tablet Increase to 4 tablets daily for a week. Then decrease to 2 tablets daily. 01/16/19   Rigoberto Noel, MD  Respiratory Therapy Supplies (FLUTTER) DEVI Use a directed. 04/11/18   Rigoberto Noel, MD  rosuvastatin (CRESTOR) 10 MG tablet Take 1 tablet (10 mg total) by mouth daily. 01/15/19 04/15/19  Alvstad, Casimiro Needle, RPH-CPP  traMADol (ULTRAM) 50 MG tablet Take 1-2 tablets (50-100 mg total) by mouth every 6 (six) hours as needed for moderate pain. 11/14/18   [name], MD    Allergies  Allergen Reactions  . Flagyl [Metronidazole] Other (See Comments)    Nerve pain  . Levofloxacin Rash  . Loperamide Hcl Rash  . Neomycin-Bacitracin Zn-Polymyx Rash  . Ofloxacin Rash  . Sulfonamide Derivatives Hives  . Band-Aid Liquid Bandage [New Skin] Other (See Comments)    Regular band-aid cause itching and redness  . Collodion Dermatitis  . Gabapentin Other (See Comments)    Mental status changes  . Latex Other (See Comments) and Dermatitis    "redness"  . Sulfa Antibiotics Hives  . Oxycontin [Oxycodone Hcl] Rash         Social History   Tobacco Use  . Smoking status: Former Smoker    Packs/day: 1.00    Years: 30.00    Pack years: 30.00    Types: Cigarettes     Last attempt to quit: 12/04/2001    Years since quitting: 17.1  . Smokeless tobacco: Never Used  . Tobacco comment:  smoked 1973-2003, up to 1.5 ppd  Substance Use Topics  . Alcohol use: Yes    Alcohol/week: 0.0 standard drinks    Comment: occasionally  . Drug use: No    Family History  Problem Relation Age of Onset  . Colon cancer Father   . Heart attack Father 69  . Diabetes Father   . Stroke Father 62  . Colon cancer Sister   . Asthma Sister   . Asthma  Mother   . Colitis Mother   . Cirrhosis Mother        non alcoholic, fatty liver  . Asthma Maternal Aunt   . Heart attack Sister 47  . Cirrhosis Brother        non alcoholic  . Colon polyps Brother   . Anemia Brother         Spherocytosis, hereditrary  . Asthma Maternal Grandmother   . Kidney failure Other        2 bro, 1 sister ? from HTN  . Prostate cancer Brother   . Breast cancer Neg Hx     Review of Systems  Constitutional: positive for fatigue and malaise or negative for fevers and chills Respiratory: positive for cough, sputum, wheezing or dyspnea on exertion Gastrointestinal: negative for nausea and diarrhea All other systems reviewed and are negative    Constitutional: in no apparent distress  There were no vitals filed for this visit. EYES: anicteric ENMT: no thrush Cardiovascular: Cor RRR Respiratory: wheezes, sob minimal activity Musculoskeletal: no pedal edema noted Skin: negatives: no rash Neuro: non-focal  Labs: Lab Results  Component Value Date   WBC 17.5 (H) 10/18/2018   HGB 11.2 (L) 10/18/2018   HCT 34.2 (L) 10/18/2018   MCV 96.1 10/18/2018   PLT 391 10/18/2018    Lab Results  Component Value Date   CREATININE 0.81 10/18/2018   BUN 19 10/18/2018   NA 128 (L) 10/18/2018   K 4.3 10/18/2018   CL 93 (L) 10/18/2018   CO2 29 10/18/2018    Lab Results  Component Value Date   ALT 46 (H) 01/06/2019   AST 26 01/06/2019   ALKPHOS 103 01/06/2019   BILITOT 0.2 01/06/2019   INR 1.43  09/30/2018     Assessment: bronchiectasis, ILD previous Serratia infection, one culture with Mycobacteria.  I do not find any evidence of ongoing infection, though it certainly is clouded with her obvious worsening pulmonary decline.  She has been treated appropriately for the Serratia x 2 and I doubt the significance of her Mycobacteria culture.    Plan: 1) repeat AFB, bactererial sputum, consider treatment if persistent.   Will update her with results as they come in.

## 2019-01-28 NOTE — Telephone Encounter (Signed)
Spoke with patient. Explained to her that we do not make CDs at our office and that she will need to go to Medical Records. She stated that while she was here yesterday, Sarah Escobar had obtained the signed release for her and stated that the CD would be sent to our office.   Kelli, please advise.

## 2019-01-28 NOTE — Telephone Encounter (Signed)
LVM for Sarah Escobar in [place] today at phone [phone] Pt is going to [facility] to Dr. Randol Kern MD and in need of copies of both CT chest scans on CD One Scan was done on 09/2018 the other on 02/2018  Called and spoke with patient today letting her know message was left for Sarah Escobar in imaging  Pt is going to [facility] on 01/30/2019 early in the morning for an appt Explained to patient that we will try to get these scans on CD for her tomorrow 01/29/2019 If not, we can arrange to have the CD mailed to Dr. Randol Kern MD office at [facility] when completed Will f/u tomorrow

## 2019-01-29 ENCOUNTER — Inpatient Hospital Stay (HOSPITAL_COMMUNITY): Admission: RE | Admit: 2019-01-29 | Payer: 59 | Source: Ambulatory Visit

## 2019-01-29 ENCOUNTER — Telehealth: Payer: Self-pay | Admitting: Pulmonary Disease

## 2019-01-29 ENCOUNTER — Encounter: Payer: Self-pay | Admitting: Cardiovascular Disease

## 2019-01-29 ENCOUNTER — Ambulatory Visit (INDEPENDENT_AMBULATORY_CARE_PROVIDER_SITE_OTHER): Payer: 59 | Admitting: Cardiovascular Disease

## 2019-01-29 VITALS — BP 90/62 | HR 81 | Ht 66.0 in | Wt 124.0 lb

## 2019-01-29 DIAGNOSIS — R0602 Shortness of breath: Secondary | ICD-10-CM

## 2019-01-29 DIAGNOSIS — Z5181 Encounter for therapeutic drug level monitoring: Secondary | ICD-10-CM | POA: Diagnosis not present

## 2019-01-29 DIAGNOSIS — I10 Essential (primary) hypertension: Secondary | ICD-10-CM | POA: Diagnosis not present

## 2019-01-29 DIAGNOSIS — I480 Paroxysmal atrial fibrillation: Secondary | ICD-10-CM | POA: Diagnosis not present

## 2019-01-29 DIAGNOSIS — I5042 Chronic combined systolic (congestive) and diastolic (congestive) heart failure: Secondary | ICD-10-CM

## 2019-01-29 DIAGNOSIS — E782 Mixed hyperlipidemia: Secondary | ICD-10-CM | POA: Diagnosis not present

## 2019-01-29 NOTE — Telephone Encounter (Signed)
Called and spoke with patient this morning letting her know CD for her CT Chest scans Is ready for pick up per Stacy in [place] will need to p/u CD at [place] [address] 3rd floor -ask for medical records Patient verbalized understanding Nothing further needed.

## 2019-01-29 NOTE — Patient Instructions (Addendum)
Medication Instructions:  DECREASE YOUR METOPROLOL TO 25 MG 1/2 TABLET BY MOUTH TWICE A DAY   If you need a refill on your cardiac medications before your next appointment, please call your pharmacy.   Lab work: FASTING LP/CMET IN FEW WEEKS WHEN YOU GET YOUR ECHO  If you have labs (blood work) drawn today and your tests are completely normal, you will receive your results only by: Marland Kitchen MyChart Message (if you have MyChart) OR . A paper copy in the mail If you have any lab test that is abnormal or we need to change your treatment, we will call you to review the results.  Testing/Procedures: Your physician has requested that you have an echocardiogram. Echocardiography is a painless test that uses sound waves to create images of your heart. It provides your doctor with information about the size and shape of your heart and how well your heart's chambers and valves are working. This procedure takes approximately one hour. There are no restrictions for this procedure. IN A FEW WEEKS  [facility] [address]  Follow-Up: At [facility], you and your health needs are our priority.  As part of our continuing mission to provide you with exceptional heart care, we have created designated Provider Care Teams.  These Care Teams include your primary Cardiologist (physician) and Advanced Practice Providers (APPs -  Physician Assistants and Nurse Practitioners) who all work together to provide you with the care you need, when you need it. You will need a follow up appointment in 6 months.  Please call our office 2 months in advance to schedule this appointment.  You may see DR [facility] or one of the following Advanced Practice Providers on your designated Care Team:   Kerin Ransom, PA-C Roby Lofts, [place] . Sande Rives, PA-C  Any Other Special Instructions Will Be Listed Below (If Applicable).  Echocardiogram An echocardiogram is a procedure that uses painless sound waves  (ultrasound) to produce an image of the heart. Images from an echocardiogram can provide important information about:  Signs of coronary artery disease (CAD).  Aneurysm detection. An aneurysm is a weak or damaged part of an artery wall that bulges out from the normal force of blood pumping through the body.  Heart size and shape. Changes in the size or shape of the heart can be associated with certain conditions, including heart failure, aneurysm, and CAD.  Heart muscle function.  Heart valve function.  Signs of a past heart attack.  Fluid buildup around the heart.  Thickening of the heart muscle.  A tumor or infectious growth around the heart valves. Tell a health care provider about:  Any allergies you have.  All medicines you are taking, including vitamins, herbs, eye drops, creams, and over-the-counter medicines.  Any blood disorders you have.  Any surgeries you have had.  Any medical conditions you have.  Whether you are pregnant or may be pregnant. What are the risks? Generally, this is a safe procedure. However, problems may occur, including:  Allergic reaction to dye (contrast) that may be used during the procedure. What happens before the procedure? No specific preparation is needed. You may eat and drink normally. What happens during the procedure?   An IV tube may be inserted into one of your veins.  You may receive contrast through this tube. A contrast is an injection that improves the quality of the pictures from your heart.  A gel will be applied to your chest.  A  wand-like tool (transducer) will be moved over your chest. The gel will help to transmit the sound waves from the transducer.  The sound waves will harmlessly bounce off of your heart to allow the heart images to be captured in real-time motion. The images will be recorded on a computer. The procedure may vary among health care providers and hospitals. What happens after the  procedure?  You may return to your normal, everyday life, including diet, activities, and medicines, unless your health care provider tells you not to do that. Summary  An echocardiogram is a procedure that uses painless sound waves (ultrasound) to produce an image of the heart.  Images from an echocardiogram can provide important information about the size and shape of your heart, heart muscle function, heart valve function, and fluid buildup around your heart.  You do not need to do anything to prepare before this procedure. You may eat and drink normally.  After the echocardiogram is completed, you may return to your normal, everyday life, unless your health care provider tells you not to do that. This information is not intended to replace advice given to you by your health care provider. Make sure you discuss any questions you have with your health care provider. Document Released: 11/17/2000 Document Revised: 12/23/2016 Document Reviewed: 12/23/2016 Elsevier Interactive Patient Education  2019 [employer].

## 2019-01-29 NOTE — Progress Notes (Signed)
Cardiology Office Note   Date:  02/02/2019   ID:  Sarah Escobar, DOB 01/26/1956, MRN [id number]  PCP:  Cassandria Anger, MD  Cardiologist:   Skeet Latch, MD   Chief Complaint  Patient presents with  . Follow-up      History of Present Illness: Sarah Escobar is a 63 y.o. female with CAD s/p NSTEMI and CABG 83/3825, chronic systolic and diastolic heart failure, PAF and ILD on chronic O2 who presents for follow up.  She was admitted 09/2018 with NSTEMI.  LHC reveaeled 2 vessel CAD with 80% ostial LAD and 70% mid RCA lesions.  It was felt that CABG would be superior to PCI.  She underwent 3v CABG (LIMA-->LAD, SVG-->D, SVG-->RCA) with Dr. Servando Snare.  Echo during that hospitalization revealed LVEF 55-60% with grade 1 diastolic dysfunction.  On intraoperative TEE her LVEF was 40-45%.  Her hospitalization was complicated by acute on chornic respiratory failure and atrial fibrillation.  She was started on amiodarone but this was discontinued due to worsening respiratory function..  She also developed hemoptysis.  Since discharge she has struggled with shortness of breath.  She is using 3L oxygen when sleeping and 6L with ambulation.  She is scheduled to see a pulmonologist at [facility] later this week.  Sarah Escobar hasn't had any chest pain or pressure.  She does have LE edema but denies orthopnea or PND.  She notes some chest congestion and cough.  She denies fever or sputum production.  She hasn't experienced any recurrent atrial fibrillation.      Past Medical History:  Diagnosis Date  . Allergy   . Asthma    adult onset  . Coronary artery disease   . Depression   . Hyperlipidemia    LDL goal = < 100  . Hypertension   . Osteopenia    mild  . Personal history of adenomatous colonic polyps/FHx colon cancer sister and father    [name]  . Sciatica of left side    intermittent    Past Surgical History:  Procedure Laterality Date  . BUNIONECTOMY    . CARDIAC CATHETERIZATION      . CERVICAL FUSION  1999   Dr Arnoldo Morale, [place]  . COLONOSCOPY  2013  . COLONOSCOPY W/ POLYPECTOMY  multiple   adenomas; [name]; last 2013  . CORONARY ARTERY BYPASS GRAFT N/A 09/30/2018   Procedure: CORONARY ARTERY BYPASS GRAFTING (CABG) X THREE, LIMA TO LAD, SVG TO DIAGONAL, SVG TO PDA  USING LEFT INTERNAL MAMMERY ARTERY AND RIGHT AND LEFT GREATER SAPHENOUS VEIN;  Surgeon: [name], MD;  Location: [place];  Service: Open Heart Surgery;  Laterality: N/A;  . discetomy lumbar  02/19/13   L1&2; Dr Arnoldo Morale, NS  . LEFT HEART CATH AND CORONARY ANGIOGRAPHY N/A 09/26/2018   Procedure: LEFT HEART CATH AND CORONARY ANGIOGRAPHY;  Surgeon: Nelva Bush, MD;  Location: [place] [facility];  Service: Cardiovascular;  Laterality: N/A;  . NASAL SINUS SURGERY  [phone]   X 3  . POLYPECTOMY  1217--2013   TA+  . TEE WITHOUT CARDIOVERSION N/A 09/30/2018   Procedure: TRANSESOPHAGEAL ECHOCARDIOGRAM (TEE);  Surgeon: [name], MD;  Location: [place];  Service: Open Heart Surgery;  Laterality: N/A;  . TOTAL ABDOMINAL HYSTERECTOMY W/ BILATERAL SALPINGOOPHORECTOMY  2003   endometriosis   . VIDEO BRONCHOSCOPY Bilateral 03/04/2018   Procedure: VIDEO BRONCHOSCOPY WITH FLUORO;  Surgeon: Rigoberto Noel, MD;  Location: [facility];  Service: Cardiopulmonary;  Laterality: Bilateral;  .  WEDGE RESECTION Left 09/30/2018   Procedure: LEFT UPPER LOBE WEDGE RESECTION;  Surgeon: [name], MD;  Location: [place];  Service: Open Heart Surgery;  Laterality: Left;     Current Outpatient Medications  Medication Sig Dispense Refill  . azithromycin (ZITHROMAX) 500 MG tablet Take 3 times (M,W,F)per week for 4-8 weeks 12 tablet 1  . Biotin 5000 MCG CAPS Take 5,000 mcg by mouth daily.     Marland Kitchen buPROPion (WELLBUTRIN XL) 300 MG 24 hr tablet Take 1 tablet (300 mg total) by mouth every morning. Must keep upcoming appt to get refills 90 tablet 1  . Cholecalciferol 2000 units TABS Take 2,000 Units by mouth daily.      . digoxin (LANOXIN) 0.125 MG tablet Take 1 tablet (0.125 mg total) by mouth daily. 30 tablet 3  . estrogen-methylTESTOSTERone 0.625-1.25 MG tablet Take 1 tablet by mouth daily.    . feeding supplement, ENSURE ENLIVE, (ENSURE ENLIVE) LIQD Take 237 mLs by mouth 2 (two) times daily between meals. (Patient taking differently: Take 237 mLs by mouth 3 (three) times a week. ) 237 mL 12  . fexofenadine (ALLEGRA) 180 MG tablet Take 180 mg by mouth daily.    . finasteride (PROSCAR) 5 MG tablet Take 2.5 mg by mouth daily.     . Flaxseed, Linseed, (FLAXSEED OIL) 1000 MG CAPS Take 1,000 mg by mouth 2 (two) times daily.     . fluticasone (FLONASE) 50 MCG/ACT nasal spray USE 1 SPRAY IN EACH NOSTRIL TWO TIMES A DAY AS NEEDED (Patient taking differently: Place 1 spray into both nostrils 2 (two) times daily as needed for allergies. ) 48 g 3  . fluticasone furoate-vilanterol (BREO ELLIPTA) 100-25 MCG/INH AEPB USE 1 INHALATION DAILY (Patient taking differently: Inhale 1 puff into the lungs daily. ) 180 each 3  . furosemide (LASIX) 20 MG tablet TAKE 1 TABLET BY MOUTH ONCE DAILY 30 tablet 0  . guaiFENesin (MUCINEX) 600 MG 12 hr tablet Take 2 tablets (1,200 mg total) by mouth 2 (two) times daily. With full glass of water 60 tablet 0  . Ipratropium-Albuterol (COMBIVENT RESPIMAT) 20-100 MCG/ACT AERS respimat Inhale 1 puff into the lungs 4 (four) times daily as needed for wheezing. Overdue for annual appt must see provider for refills 3 Inhaler 0  . ipratropium-albuterol (DUONEB) 0.5-2.5 (3) MG/3ML SOLN INHALE CONTENTS OF 1 VIAL INTO THE LUNGS VIA NEBULIZER EVERY 6 (SIX) HOURS AS NEEDED 360 mL 2  . levalbuterol (XOPENEX) 1.25 MG/0.5ML nebulizer solution Take 1.25 mg by nebulization every 6 (six) hours as needed for wheezing or shortness of breath. 30 each 3  . LORazepam (ATIVAN) 0.5 MG tablet Take 1-2 tablets (0.5-1 mg total) by mouth at bedtime as needed for anxiety or sleep. Patient needs office visit before refills will be  given 60 tablet 2  . metoprolol tartrate (LOPRESSOR) 25 MG tablet Take 25 mg by mouth as directed. TAKE 1/2 TABLET BY MOUTH TWICE A DAY    . Multiple Vitamins-Minerals (ONE-A-DAY 50 PLUS PO) Take 1 tablet by mouth daily.     . potassium chloride SA (K-DUR,KLOR-CON) 10 MEQ tablet Take 1 tablet (10 mEq total) by mouth daily. 30 tablet 3  . predniSONE (DELTASONE) 10 MG tablet Increase to 4 tablets daily for a week. Then decrease to 2 tablets daily. 90 tablet 1  . Respiratory Therapy Supplies (FLUTTER) DEVI Use a directed. 1 each 0  . rosuvastatin (CRESTOR) 10 MG tablet Take 1 tablet (10 mg total) by mouth daily. [place]  tablet 10  . traMADol (ULTRAM) 50 MG tablet Take 1-2 tablets (50-100 mg total) by mouth every 6 (six) hours as needed for moderate pain. 28 tablet 0  . albuterol (PROVENTIL) (2.5 MG/3ML) 0.083% nebulizer solution INHALE CONTENTS OF ONE VIAL VIA NEBULIZER EVERY 4 HOURS AS NEEDED 300 mL 3   No current facility-administered medications for this visit.     Allergies:   Flagyl [metronidazole]; Levofloxacin; Loperamide hcl; Neomycin-bacitracin zn-polymyx; Ofloxacin; Sulfonamide derivatives; Band-aid liquid bandage [new skin]; Collodion; Gabapentin; Latex; Sulfa antibiotics; and Oxycontin [oxycodone hcl]    Social History:  The patient  reports that she quit smoking about 17 years ago. Her smoking use included cigarettes. She has a 30.00 pack-year smoking history. She has never used smokeless tobacco. She reports current alcohol use. She reports that she does not use drugs.   Family History:  The patient's family history includes Anemia in her brother; Asthma in her maternal aunt, maternal grandmother, mother, and sister; Cirrhosis in her brother and mother; Colitis in her mother; Colon cancer in her father and sister; Colon polyps in her brother; Diabetes in her father; Heart attack (age of onset: 27) in her sister; Heart attack (age of onset: 74) in her father; Kidney failure in an other family  member; Prostate cancer in her brother; Stroke (age of onset: 63) in her father.    ROS:  Please see the history of present illness.   Otherwise, review of systems are positive for none.   All other systems are reviewed and negative.    PHYSICAL EXAM: VS:  BP 90/62 (BP Location: Left Arm, Patient Position: Sitting, Cuff Size: Normal)   Pulse 81   Ht 5\' 6"  (1.676 m)   Wt 124 lb (56.2 kg)   SpO2 94%   BMI 20.01 kg/m  , BMI Body mass index is 20.01 kg/m. GENERAL:  Well appearing HEENT:  Pupils equal round and reactive, fundi not visualized, oral mucosa unremarkable NECK:  No jugular venous distention, waveform within normal limits, carotid upstroke brisk and symmetric, no bruits, no thyromegaly LYMPHATICS:  No cervical adenopathy LUNGS:  Clear to auscultation bilaterally HEART:  RRR.  PMI not displaced or sustained,S1 and S2 within normal limits, no S3, no S4, no clicks, no rubs, no murmurs ABD:  Flat, positive bowel sounds normal in frequency in pitch, no bruits, no rebound, no guarding, no midline pulsatile mass, no hepatomegaly, no splenomegaly EXT:  2 plus pulses throughout, 1+ LE edema to lower tibia bilaterally, no cyanosis no clubbing SKIN:  No rashes no nodules NEURO:  Cranial nerves II through XII grossly intact, motor grossly intact throughout PSYCH:  Cognitively intact, oriented to person place and time   EKG:  EKG is not ordered today.  Echo 09/27/18: Study Conclusions  - Left ventricle: The cavity size was normal. There was mild focal   basal hypertrophy of the septum. Systolic function was normal.   The estimated ejection fraction was in the range of 55% to 60%.   There is hypokinesis of the mid septal myocardium. Doppler   parameters are consistent with abnormal left ventricular   relaxation (grade 1 diastolic dysfunction).  Impressions:  - Septal hypokinesis with overall preserved LV systolic function;   mild diastolic dysfunction; sclerotic aortic  valve.   LHC 09/26/18: Conclusions: 1. Diffuse, heavy calcification of the coronary arteries with significant 2-vessel CAD.  There is an 80% ostial/proximal LAD stenosis as well as an eccentric 70% mid RCA lesion. 2. Normal left ventricular systolic function and  filling pressure.  Recommendations: 1. Images reviewed with Dr. Ellyn Hack.  We both agree that given heavy calcification and ostial disease of the LAD would be best suited for bypass.  PCI would be high risk, requiring atherectomy and stenting back into the distal LMCA.  Cardiac surgery has been contacted for consultation. 2. Restart heparin infusion 2 hours after TR band removal. 3. Metoprolol and IV nitroglycerin for antianginal therapy. 4. Aggressive secondary prevention. 5. Obtain transthoracic echocardiogram.  Recent Labs: 09/30/2018: TSH 1.676 10/17/2018: Magnesium 2.2 10/18/2018: BUN 19; Creatinine, Ser 0.81; Hemoglobin 11.2; Platelets 391; Potassium 4.3; Sodium 128 01/06/2019: ALT 46   09/30/18: TSH 1.67   Lipid Panel    Component Value Date/Time   CHOL 198 01/14/2019 1420   CHOL 121 (L) 07/02/2016 0801   TRIG 71 01/14/2019 1420   TRIG 62 07/02/2016 0801   HDL 50 01/14/2019 1420   HDL 42 (L) 07/02/2016 0801   CHOLHDL 4.0 01/14/2019 1420   CHOLHDL 3.3 09/27/2018 0058   VLDL 14 09/27/2018 0058   LDLCALC 134 (H) 01/14/2019 1420   LDLCALC 67 07/02/2016 0801      Wt Readings from Last 3 Encounters:  01/29/19 124 lb (56.2 kg)  01/27/19 124 lb 6.4 oz (56.4 kg)  01/14/19 122 lb 6.4 oz (55.5 kg)      ASSESSMENT AND PLAN:  # Chronic systolic and diastolic heart failure: # Shortness of breath: Echo was preserved on TTE but reduced on TEE.  She has shortness of breath that is mostly attributable to her lung disease.  However, she also has LE edema and it is unclear how her systolic function dysfunction may be contributing.  We will get an echo to better assess.  Increase lasix to 40mg  daily.  Will likely stop  digoxin if LVEF is within normal limits.  # Dizziness: BP is low.  Reduce metoprolol to 12.5mg  bid  # Paroxysmal atrial fibrillation: No known recurrence.  Reduce metoprolol as above.  Continue digoxin.  No anticoagulation 2/2 hemoptysis.  # CAD s/p CABG: # Hyperlipidemia: Stable.  Continue aspirin and rosuvastatin.  Reduce metoprolol as above.  Check fasting lipids/CMP.   Current medicines are reviewed at length with the patient today.  The patient does not have concerns regarding medicines.  The following changes have been made:  no change  Labs/ tests ordered today include:   Orders Placed This Encounter  Procedures  . Comprehensive metabolic panel  . Lipid panel  . ECHOCARDIOGRAM COMPLETE     Disposition:   FU with Rashidi Loh C. Oval Linsey, MD, [facility] in 6 months.     Signed, Kristene Liberati C. Oval Linsey, MD, [facility]  02/02/2019 10:36 AM    [place]

## 2019-01-29 NOTE — Telephone Encounter (Signed)
Started the process of ofev paperwork with the patient Patient has signed and completed their portion Gave these documents to cherina for RA to sign his portion Awaiting forms to be completed.

## 2019-01-30 ENCOUNTER — Other Ambulatory Visit: Payer: Self-pay | Admitting: Internal Medicine

## 2019-01-30 DIAGNOSIS — J849 Interstitial pulmonary disease, unspecified: Secondary | ICD-10-CM | POA: Diagnosis not present

## 2019-01-30 DIAGNOSIS — I255 Ischemic cardiomyopathy: Secondary | ICD-10-CM | POA: Diagnosis not present

## 2019-01-30 DIAGNOSIS — J479 Bronchiectasis, uncomplicated: Secondary | ICD-10-CM | POA: Diagnosis not present

## 2019-01-30 DIAGNOSIS — A319 Mycobacterial infection, unspecified: Secondary | ICD-10-CM | POA: Diagnosis not present

## 2019-01-30 DIAGNOSIS — R0602 Shortness of breath: Secondary | ICD-10-CM | POA: Diagnosis not present

## 2019-01-30 DIAGNOSIS — J9621 Acute and chronic respiratory failure with hypoxia: Secondary | ICD-10-CM | POA: Diagnosis not present

## 2019-01-30 DIAGNOSIS — J398 Other specified diseases of upper respiratory tract: Secondary | ICD-10-CM | POA: Diagnosis not present

## 2019-01-30 DIAGNOSIS — J45998 Other asthma: Secondary | ICD-10-CM | POA: Diagnosis not present

## 2019-01-30 DIAGNOSIS — R918 Other nonspecific abnormal finding of lung field: Secondary | ICD-10-CM | POA: Diagnosis not present

## 2019-01-30 DIAGNOSIS — J84111 Idiopathic interstitial pneumonia, not otherwise specified: Secondary | ICD-10-CM | POA: Diagnosis not present

## 2019-01-30 DIAGNOSIS — R0600 Dyspnea, unspecified: Secondary | ICD-10-CM | POA: Diagnosis not present

## 2019-01-31 ENCOUNTER — Ambulatory Visit (HOSPITAL_COMMUNITY): Payer: 59

## 2019-01-31 ENCOUNTER — Encounter (HOSPITAL_COMMUNITY): Payer: Self-pay | Admitting: *Deleted

## 2019-01-31 DIAGNOSIS — J45998 Other asthma: Secondary | ICD-10-CM | POA: Diagnosis not present

## 2019-01-31 DIAGNOSIS — I255 Ischemic cardiomyopathy: Secondary | ICD-10-CM | POA: Diagnosis not present

## 2019-01-31 DIAGNOSIS — R0602 Shortness of breath: Secondary | ICD-10-CM | POA: Diagnosis not present

## 2019-01-31 DIAGNOSIS — J9621 Acute and chronic respiratory failure with hypoxia: Secondary | ICD-10-CM | POA: Diagnosis not present

## 2019-01-31 DIAGNOSIS — J849 Interstitial pulmonary disease, unspecified: Secondary | ICD-10-CM | POA: Diagnosis not present

## 2019-01-31 MED ORDER — LIDOCAINE HCL 1 % IJ SOLN
.50 | INTRAMUSCULAR | Status: DC
Start: ? — End: 2019-01-31

## 2019-01-31 MED ORDER — GUAIFENESIN ER 600 MG PO TB12
600.00 | ORAL_TABLET | ORAL | Status: DC
Start: 2019-02-09 — End: 2019-01-31

## 2019-01-31 MED ORDER — BUPROPION HCL ER (XL) 150 MG PO TB24
300.00 | ORAL_TABLET | ORAL | Status: DC
Start: 2019-02-09 — End: 2019-01-31

## 2019-01-31 MED ORDER — BUDESONIDE-FORMOTEROL FUMARATE 160-4.5 MCG/ACT IN AERO
2.00 | INHALATION_SPRAY | RESPIRATORY_TRACT | Status: DC
Start: 2019-02-08 — End: 2019-01-31

## 2019-01-31 MED ORDER — GENERIC EXTERNAL MEDICATION
12.50 | Status: DC
Start: 2019-02-08 — End: 2019-01-31

## 2019-01-31 MED ORDER — ENOXAPARIN SODIUM 40 MG/0.4ML ~~LOC~~ SOLN
40.00 | SUBCUTANEOUS | Status: DC
Start: 2019-02-09 — End: 2019-01-31

## 2019-01-31 MED ORDER — GENERIC EXTERNAL MEDICATION
1.00 | Status: DC
Start: 2019-02-04 — End: 2019-01-31

## 2019-01-31 MED ORDER — MELATONIN 3 MG PO TABS
3.00 | ORAL_TABLET | ORAL | Status: DC
Start: 2019-02-08 — End: 2019-01-31

## 2019-01-31 MED ORDER — ROSUVASTATIN CALCIUM 10 MG PO TABS
10.00 | ORAL_TABLET | ORAL | Status: DC
Start: 2019-02-04 — End: 2019-01-31

## 2019-01-31 MED ORDER — LORAZEPAM 0.5 MG PO TABS
0.50 | ORAL_TABLET | ORAL | Status: DC
Start: 2019-01-31 — End: 2019-01-31

## 2019-01-31 MED ORDER — DIGOXIN 125 MCG PO TABS
.13 | ORAL_TABLET | ORAL | Status: DC
Start: 2019-02-09 — End: 2019-01-31

## 2019-01-31 MED ORDER — FLUTICASONE PROPIONATE 50 MCG/ACT NA SUSP
1.00 | NASAL | Status: DC
Start: ? — End: 2019-01-31

## 2019-01-31 MED ORDER — ACETAMINOPHEN 325 MG PO TABS
975.00 | ORAL_TABLET | ORAL | Status: DC
Start: ? — End: 2019-01-31

## 2019-01-31 MED ORDER — IPRATROPIUM-ALBUTEROL 0.5-2.5 (3) MG/3ML IN SOLN
3.00 | RESPIRATORY_TRACT | Status: DC
Start: 2019-02-08 — End: 2019-01-31

## 2019-01-31 NOTE — Progress Notes (Signed)
Noted that Sarah Escobar is in the hospital at [facility]. Will cancel appointments for cardiac rehab.Barnet Pall, RN,BSN 01/31/2019 8:57 AM

## 2019-02-01 DIAGNOSIS — J9621 Acute and chronic respiratory failure with hypoxia: Secondary | ICD-10-CM | POA: Diagnosis not present

## 2019-02-01 DIAGNOSIS — J45998 Other asthma: Secondary | ICD-10-CM | POA: Diagnosis not present

## 2019-02-01 DIAGNOSIS — I255 Ischemic cardiomyopathy: Secondary | ICD-10-CM | POA: Diagnosis not present

## 2019-02-01 DIAGNOSIS — J849 Interstitial pulmonary disease, unspecified: Secondary | ICD-10-CM | POA: Diagnosis not present

## 2019-02-02 ENCOUNTER — Encounter: Payer: Self-pay | Admitting: Cardiovascular Disease

## 2019-02-03 ENCOUNTER — Ambulatory Visit (HOSPITAL_COMMUNITY): Payer: 59

## 2019-02-03 MED ORDER — CALCIUM CARBONATE ANTACID 750 MG PO CHEW
CHEWABLE_TABLET | ORAL | Status: DC
Start: 2019-02-03 — End: 2019-02-03

## 2019-02-03 MED ORDER — BUSPIRONE HCL 10 MG PO TABS
10.00 | ORAL_TABLET | ORAL | Status: DC
Start: 2019-02-09 — End: 2019-02-03

## 2019-02-03 MED ORDER — FUROSEMIDE 20 MG PO TABS
20.00 | ORAL_TABLET | ORAL | Status: DC
Start: 2019-02-09 — End: 2019-02-03

## 2019-02-03 MED ORDER — TRAZODONE HCL 50 MG PO TABS
50.00 | ORAL_TABLET | ORAL | Status: DC
Start: ? — End: 2019-02-03

## 2019-02-03 MED ORDER — PREDNISONE 20 MG PO TABS
20.00 | ORAL_TABLET | ORAL | Status: DC
Start: 2019-02-09 — End: 2019-02-03

## 2019-02-03 MED ORDER — LORAZEPAM 0.5 MG PO TABS
.50 | ORAL_TABLET | ORAL | Status: DC
Start: ? — End: 2019-02-03

## 2019-02-03 MED ORDER — GENERIC EXTERNAL MEDICATION
1.00 | Status: DC
Start: 2019-02-04 — End: 2019-02-03

## 2019-02-03 MED ORDER — CHOLECALCIFEROL 25 MCG (1000 UT) PO TABS
1000.00 | ORAL_TABLET | ORAL | Status: DC
Start: 2019-02-04 — End: 2019-02-03

## 2019-02-03 MED ORDER — PANTOPRAZOLE SODIUM 40 MG PO TBEC
40.00 | DELAYED_RELEASE_TABLET | ORAL | Status: DC
Start: 2019-02-09 — End: 2019-02-03

## 2019-02-03 MED ORDER — SALINE NASAL SPRAY 0.65 % NA SOLN
1.00 | NASAL | Status: DC
Start: ? — End: 2019-02-03

## 2019-02-03 MED ORDER — BENZONATATE 100 MG PO CAPS
200.00 | ORAL_CAPSULE | ORAL | Status: DC
Start: ? — End: 2019-02-03

## 2019-02-04 ENCOUNTER — Ambulatory Visit: Payer: 59 | Admitting: Internal Medicine

## 2019-02-05 ENCOUNTER — Ambulatory Visit (HOSPITAL_COMMUNITY): Payer: 59

## 2019-02-06 ENCOUNTER — Ambulatory Visit: Payer: 59 | Admitting: Cardiothoracic Surgery

## 2019-02-07 ENCOUNTER — Ambulatory Visit (HOSPITAL_COMMUNITY): Payer: 59

## 2019-02-08 MED ORDER — SALINE NASAL SPRAY 0.65 % NA SOLN
1.00 | NASAL | Status: DC
Start: ? — End: 2019-02-08

## 2019-02-08 MED ORDER — MORPHINE SULFATE 10 MG/5ML PO SOLN
5.00 | ORAL | Status: DC
Start: ? — End: 2019-02-08

## 2019-02-08 MED ORDER — HYDROCODONE-ACETAMINOPHEN 5-325 MG PO TABS
1.00 | ORAL_TABLET | ORAL | Status: DC
Start: ? — End: 2019-02-08

## 2019-02-08 MED ORDER — POLYETHYLENE GLYCOL 3350 17 G PO PACK
17.00 | PACK | ORAL | Status: DC
Start: ? — End: 2019-02-08

## 2019-02-09 ENCOUNTER — Telehealth: Payer: Self-pay | Admitting: Family Medicine

## 2019-02-09 MED ORDER — LORAZEPAM 0.5 MG PO TABS: 0.5000 mg | ORAL_TABLET | Freq: Every evening | ORAL | 0 refills | Status: DC | PRN

## 2019-02-09 NOTE — Telephone Encounter (Signed)
ON call Note   Called reguarding pt discharged from [facility] with ILD to  [facility]. Pt nonambulatory as O2 Sats drop to 80% with minimal movement despite continuous oxygen. This was same as at discharge, pt new declining baseline.  Nurse calls with request for refill of lorazepam at bedtime as pt cannot come to office.  Last refilled #60 on 2/7. No red flags noted and  NCCSR reviewed.   Refilled 10 tabs to last 5 days, pt will need to call office for longterm refills as it appears appt requested.

## 2019-02-10 ENCOUNTER — Ambulatory Visit (HOSPITAL_COMMUNITY): Payer: 59

## 2019-02-11 ENCOUNTER — Ambulatory Visit: Payer: 59 | Admitting: Pulmonary Disease

## 2019-02-12 ENCOUNTER — Ambulatory Visit (HOSPITAL_COMMUNITY): Payer: 59

## 2019-02-12 ENCOUNTER — Other Ambulatory Visit (HOSPITAL_COMMUNITY): Payer: 59

## 2019-02-12 ENCOUNTER — Other Ambulatory Visit: Payer: 59

## 2019-02-12 ENCOUNTER — Telehealth: Payer: Self-pay | Admitting: Emergency Medicine

## 2019-02-12 MED ORDER — LORAZEPAM 0.5 MG PO TABS: 0.5000 mg | ORAL_TABLET | Freq: Two times a day (BID) | ORAL | 1 refills | Status: DC | PRN

## 2019-02-12 NOTE — Addendum Note (Signed)
Addended by: Cassandria Anger on: 02/12/2019 07:54 AM   Modules accepted: Orders

## 2019-02-12 NOTE — Telephone Encounter (Signed)
Copied from [place] [id number]. Topic: General - Inquiry >> Feb 10, 2019  2:23 PM Virl Axe D wrote: Reason for CRM: Deanna, a [facility] Nurse stated that pt was released from the hospital and into [facility] care. Deanna needs to know which medications Dr. Alain Marion would like for the pt to remain on and have those refilled today. Please advise. CB#[phone] >> Feb 12, 2019  4:55 PM Percell Belt A wrote:  Tilda Burrow, a [facility] called back and stated they have not heard anything back and family keep calling.  Please advise  Best number  [phone]

## 2019-02-12 NOTE — Telephone Encounter (Signed)
Amy, Thank you! AP

## 2019-02-13 ENCOUNTER — Ambulatory Visit: Payer: 59 | Admitting: Cardiothoracic Surgery

## 2019-02-13 NOTE — Telephone Encounter (Signed)
Is this list of meds Anette's current list of meds? Thanks

## 2019-02-13 NOTE — Telephone Encounter (Signed)
Please advise 

## 2019-02-14 ENCOUNTER — Ambulatory Visit (HOSPITAL_COMMUNITY): Payer: 59

## 2019-02-14 LAB — MYCOBACTERIA,CULT W/FLUOROCHROME SMEAR
MICRO NUMBER:: 115260
SMEAR:: NONE SEEN
SPECIMEN QUALITY:: ADEQUATE

## 2019-02-14 LAB — RESPIRATORY CULTURE OR RESPIRATORY AND SPUTUM CULTURE
MICRO NUMBER: 115261
RESULT:: NORMAL
SPECIMEN QUALITY: ADEQUATE

## 2019-02-14 NOTE — Telephone Encounter (Signed)
Yes, is there any you do not want patient taking?

## 2019-02-15 NOTE — Telephone Encounter (Signed)
No change in current meds.  Thank you

## 2019-02-17 ENCOUNTER — Ambulatory Visit (HOSPITAL_COMMUNITY): Payer: 59

## 2019-02-17 ENCOUNTER — Telehealth: Payer: Self-pay | Admitting: Internal Medicine

## 2019-02-17 MED ORDER — DIGOXIN 125 MCG PO TABS: 0.1250 mg | ORAL_TABLET | Freq: Every day | ORAL | 3 refills | Status: DC

## 2019-02-17 MED ORDER — FLUTICASONE PROPIONATE 50 MCG/ACT NA SUSP: 1.0000 | Freq: Two times a day (BID) | NASAL | 3 refills | Status: AC | PRN

## 2019-02-17 MED ORDER — POTASSIUM CHLORIDE CRYS ER 10 MEQ PO TBCR: 10.0000 meq | EXTENDED_RELEASE_TABLET | Freq: Every day | ORAL | 3 refills | Status: AC

## 2019-02-17 MED ORDER — IPRATROPIUM-ALBUTEROL 20-100 MCG/ACT IN AERS: 1.0000 | INHALATION_SPRAY | Freq: Four times a day (QID) | RESPIRATORY_TRACT | 0 refills | Status: AC | PRN

## 2019-02-17 MED ORDER — LEVALBUTEROL HCL 1.25 MG/3ML IN NEBU: 1.2500 mg | INHALATION_SOLUTION | Freq: Four times a day (QID) | RESPIRATORY_TRACT | 12 refills | Status: AC | PRN

## 2019-02-17 NOTE — Telephone Encounter (Signed)
RX fixed and sent to [facility]

## 2019-02-17 NOTE — Telephone Encounter (Signed)
RXs sent, will have to contact pulmonology to have certain meds filled

## 2019-02-17 NOTE — Telephone Encounter (Signed)
Patient called requesting refill on Xopenex.  States she is in Hospice care with Plotnikov as the signing MD.  Patient states she thought since Plotnikov was the signing MD he would be the one to refill this medication and not Pulmonary.  Please follow up with patient in regard.

## 2019-02-18 ENCOUNTER — Telehealth: Payer: Self-pay | Admitting: Pulmonary Disease

## 2019-02-18 NOTE — Telephone Encounter (Signed)
Returned call to [place] at [facility] [employer], she states they are waiting on the [facility] PA. They will place the order on hold until the PA has been completed and would like a call once the PA has been done.

## 2019-02-19 ENCOUNTER — Ambulatory Visit (HOSPITAL_COMMUNITY): Payer: 59

## 2019-02-19 NOTE — Telephone Encounter (Signed)
Left message for [facility]. Requested to have PA info sent to triage fax. Per patient's chart, she is being followed by [facility] now.

## 2019-02-19 NOTE — Telephone Encounter (Signed)
OFEV is not handled through the injection room. Message will be routed to triage.

## 2019-02-20 ENCOUNTER — Telehealth: Payer: Self-pay | Admitting: Internal Medicine

## 2019-02-20 ENCOUNTER — Other Ambulatory Visit: Payer: Self-pay | Admitting: Pulmonary Disease

## 2019-02-20 NOTE — Telephone Encounter (Signed)
Copied from [place] [id number]. Topic: Quick Communication - Rx Refill/Question >> Feb 20, 2019  2:32 PM Bea Graff, NT wrote: Medication: buPROPion (WELLBUTRIN XL) 300 MG 24 hr tablet, furosemide (LASIX) 20 MG tablet, ipratropium-albuterol (DUONEB) 0.5-2.5 (3) MG/3ML SOLN  Patient states she is now in hospice and they state that her PCP has to fill her medications. Also changed pharmacies due to insurance.   Has the patient contacted their pharmacy? Yes.   (Agent: If no, request that the patient contact the pharmacy for the refill.) (Agent: If yes, when and what did the pharmacy advise?)  Preferred Pharmacy (with phone number or street name): [place], [place] [phone] (Phone) [phone] (Fax)    Agent: Please be advised that RX refills may take up to 3 business days. We ask that you follow-up with your pharmacy.

## 2019-02-21 ENCOUNTER — Ambulatory Visit (HOSPITAL_COMMUNITY): Payer: 59

## 2019-02-21 ENCOUNTER — Telehealth: Payer: Self-pay | Admitting: Internal Medicine

## 2019-02-21 ENCOUNTER — Ambulatory Visit: Payer: Self-pay

## 2019-02-21 MED ORDER — BUPROPION HCL ER (XL) 300 MG PO TB24: 300.0000 mg | ORAL_TABLET | Freq: Every morning | ORAL | 1 refills | Status: AC

## 2019-02-21 MED ORDER — IPRATROPIUM-ALBUTEROL 0.5-2.5 (3) MG/3ML IN SOLN: RESPIRATORY_TRACT | 2 refills | Status: AC

## 2019-02-21 MED ORDER — FUROSEMIDE 20 MG PO TABS: 20.0000 mg | ORAL_TABLET | Freq: Every day | ORAL | 0 refills | Status: DC

## 2019-02-21 NOTE — Telephone Encounter (Signed)
RXs sent.

## 2019-02-21 NOTE — Telephone Encounter (Signed)
Copied from [place] [id number]. Topic: Quick Communication - Rx Refill/Question >> Feb 21, 2019  3:32 PM Sarah Escobar, NT wrote: Medication: buspar 10 MG, prednisone 20 MG (wants to increase to 30 MG)  Pt wants to know if she can take the z-pac she has on hand to help with her URI  Pt states was prescibed by [facility]   Has the patient contacted their pharmacy? Yes.   (Agent: If no, request that the patient contact the pharmacy for the refill.) (Agent: If yes, when and what did the pharmacy advise?)  Preferred Pharmacy (with phone number or street name): [place], [place] [phone] (Phone) [phone] (Fax)    Agent: Please be advised that RX refills may take up to 3 business days. We ask that you follow-up with your pharmacy.

## 2019-02-21 NOTE — Telephone Encounter (Signed)
Pt called stating that she was under hospice care for a lung disease.  She states that she was on Welbutrin and it was stopped.  She states she is suffering depression because of her diagnosis.  She states she is having a hard time sleeping and feels weak.. She states that she coughs all night and it may be just her lung condition.  Pt is tearful. Call place to office [place]. Pt instructed to call her hospice coordinator and address theses symptoms with them because office was closed and would not open till Monday. Pt verbalized understanding of all instructions. Reason for Disposition . [1] Depression AND [2] worsening (e.g.,sleeping poorly, less able to do activities of daily living)  Answer Assessment - Initial Assessment Questions 1. CONCERN: "What happened that made you call today?"     Depressed because of hospice 2. DEPRESSION SYMPTOM SCREENING: "How are you feeling overall?" (e.g., decreased energy, increased sleeping or difficulty sleeping, difficulty concentrating, feelings of sadness, guilt, hopelessness, or worthlessness)     Difficult to sleep,decreassed energy 3. RISK OF HARM - SUICIDAL IDEATION:  "Do you ever have thoughts of hurting or killing yourself?"  (e.g., yes, no, no but preoccupation with thoughts about death)   - INTENT:  "Do you have thoughts of hurting or killing yourself right NOW?" (e.g., yes, no, N/A)   - PLAN: "Do you have a specific plan for how you would do this?" (e.g., gun, knife, overdose, no plan, N/A)     no 4. RISK OF HARM - HOMICIDAL IDEATION:  "Do you ever have thoughts of hurting or killing someone else?"  (e.g., yes, no, no but preoccupation with thoughts about death)   - INTENT:  "Do you have thoughts of hurting or killing someone right NOW?" (e.g., yes, no, N/A)   - PLAN: "Do you have a specific plan for how you would do this?" (e.g., gun, knife, no plan, N/A)      no 5. FUNCTIONAL IMPAIRMENT: "How have things been going for you overall in your life?  Have you had any more difficulties than usual doing your normal daily activities?"  (e.g., better, same, worse; self-care, school, work, interactions)     Much more difficult 6. SUPPORT: "Who is with you now?" "Who do you live with?" "Do you have family or friends nearby who you can talk to?"      husband 7. THERAPIST: "Do you have a counselor or therapist? Name?"     No 8. STRESSORS: "Has there been any new stress or recent changes in your life?"     Hospice Dx 9. DRUG ABUSE/ALCOHOL: "Do you drink alcohol or use any illegal drugs?"      No 10. OTHER: "Do you have any other health or medical symptoms right now?" (e.g., fever)       No but has cough cough 11. PREGNANCY: "Is there any chance you are pregnant?" "When was your last menstrual period?"       No  Protocols used: DEPRESSION-A-AH

## 2019-02-24 ENCOUNTER — Telehealth: Payer: Self-pay

## 2019-02-24 ENCOUNTER — Ambulatory Visit (HOSPITAL_COMMUNITY): Payer: 59

## 2019-02-24 NOTE — Telephone Encounter (Signed)
FYI

## 2019-02-24 NOTE — Telephone Encounter (Signed)
Please advise about refills 

## 2019-02-24 NOTE — Telephone Encounter (Signed)
Forms placed in Dr Bari Mantis box. Dr Elsworth Soho to return 02/26/19.

## 2019-02-24 NOTE — Telephone Encounter (Signed)
What is the problem? Thx

## 2019-02-24 NOTE — Telephone Encounter (Signed)
Husband came by office to drop of [employer] form, form has been placed in Dr. Elsworth Soho box for review.   Call back [phone]

## 2019-02-25 ENCOUNTER — Telehealth: Payer: Self-pay | Admitting: Pulmonary Disease

## 2019-02-25 NOTE — Telephone Encounter (Signed)
Spoke with the pt  She states seen by [facility] and some of her meds have changed- will have the hospice nurse who is coming tomorrow will fax [place] an updated list    Dionicio Stall, [place] Frannie on long hold [facility]

## 2019-02-25 NOTE — Telephone Encounter (Signed)
Error.  Already opened message in triage.

## 2019-02-25 NOTE — Telephone Encounter (Signed)
Pt would like to know if she is still under RA's care for any of her meds while she is under hospice care.

## 2019-02-26 ENCOUNTER — Ambulatory Visit (HOSPITAL_COMMUNITY): Payer: 59

## 2019-02-27 ENCOUNTER — Ambulatory Visit: Payer: 59 | Admitting: Internal Medicine

## 2019-02-27 NOTE — Telephone Encounter (Signed)
Pt states her breathing has been difficult, headache, sinus congestion, chest congestion, and a cough that why she was wanting an increase on prednisone and to take her z-pak. Hospice doctor did prescribe patient some doxycycline.

## 2019-02-27 NOTE — Telephone Encounter (Signed)
LMTCB x1 for pt.  

## 2019-02-28 ENCOUNTER — Ambulatory Visit: Payer: 59 | Admitting: Pulmonary Disease

## 2019-02-28 ENCOUNTER — Ambulatory Visit (HOSPITAL_COMMUNITY): Payer: 59

## 2019-02-28 NOTE — Telephone Encounter (Signed)
ATC [facility]. Received a message stating that due to high call volumes due to COVID-19 my wait time would be over 25+ minutes. Will try back.

## 2019-03-02 NOTE — Telephone Encounter (Signed)
Noted.  It appears that she is on 20 mg of prednisone a day.  It should be sufficient.  Please finish doxycycline.  Thank you

## 2019-03-03 ENCOUNTER — Ambulatory Visit (HOSPITAL_COMMUNITY): Payer: 59

## 2019-03-03 IMAGING — CR DG CHEST 2V
2 series · 2 of 2 positions shown · non-contrast
Comparison: 10/19/2018

CLINICAL DATA: Severe shortness of breath today.

EXAM:
CHEST - 2 VIEW

[w chest pa]
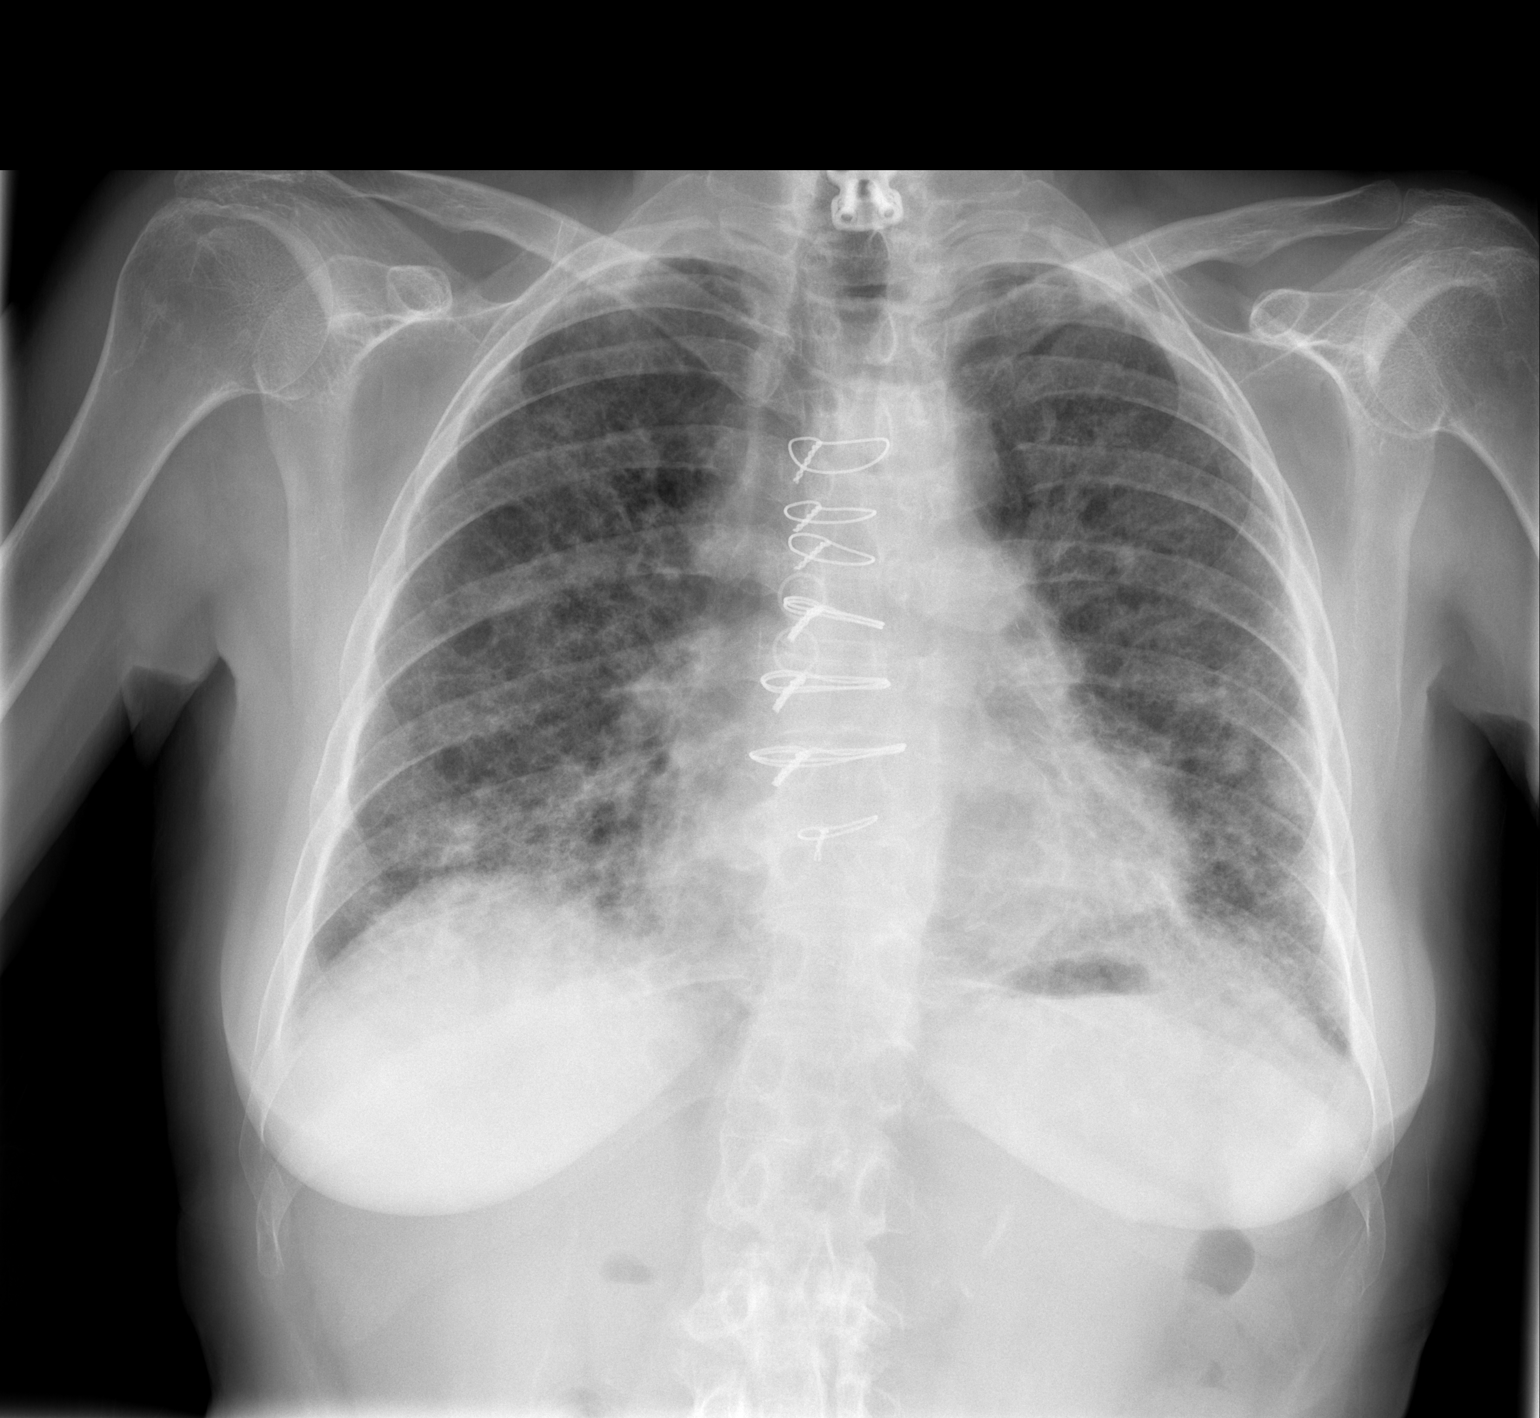

[w chest lat]
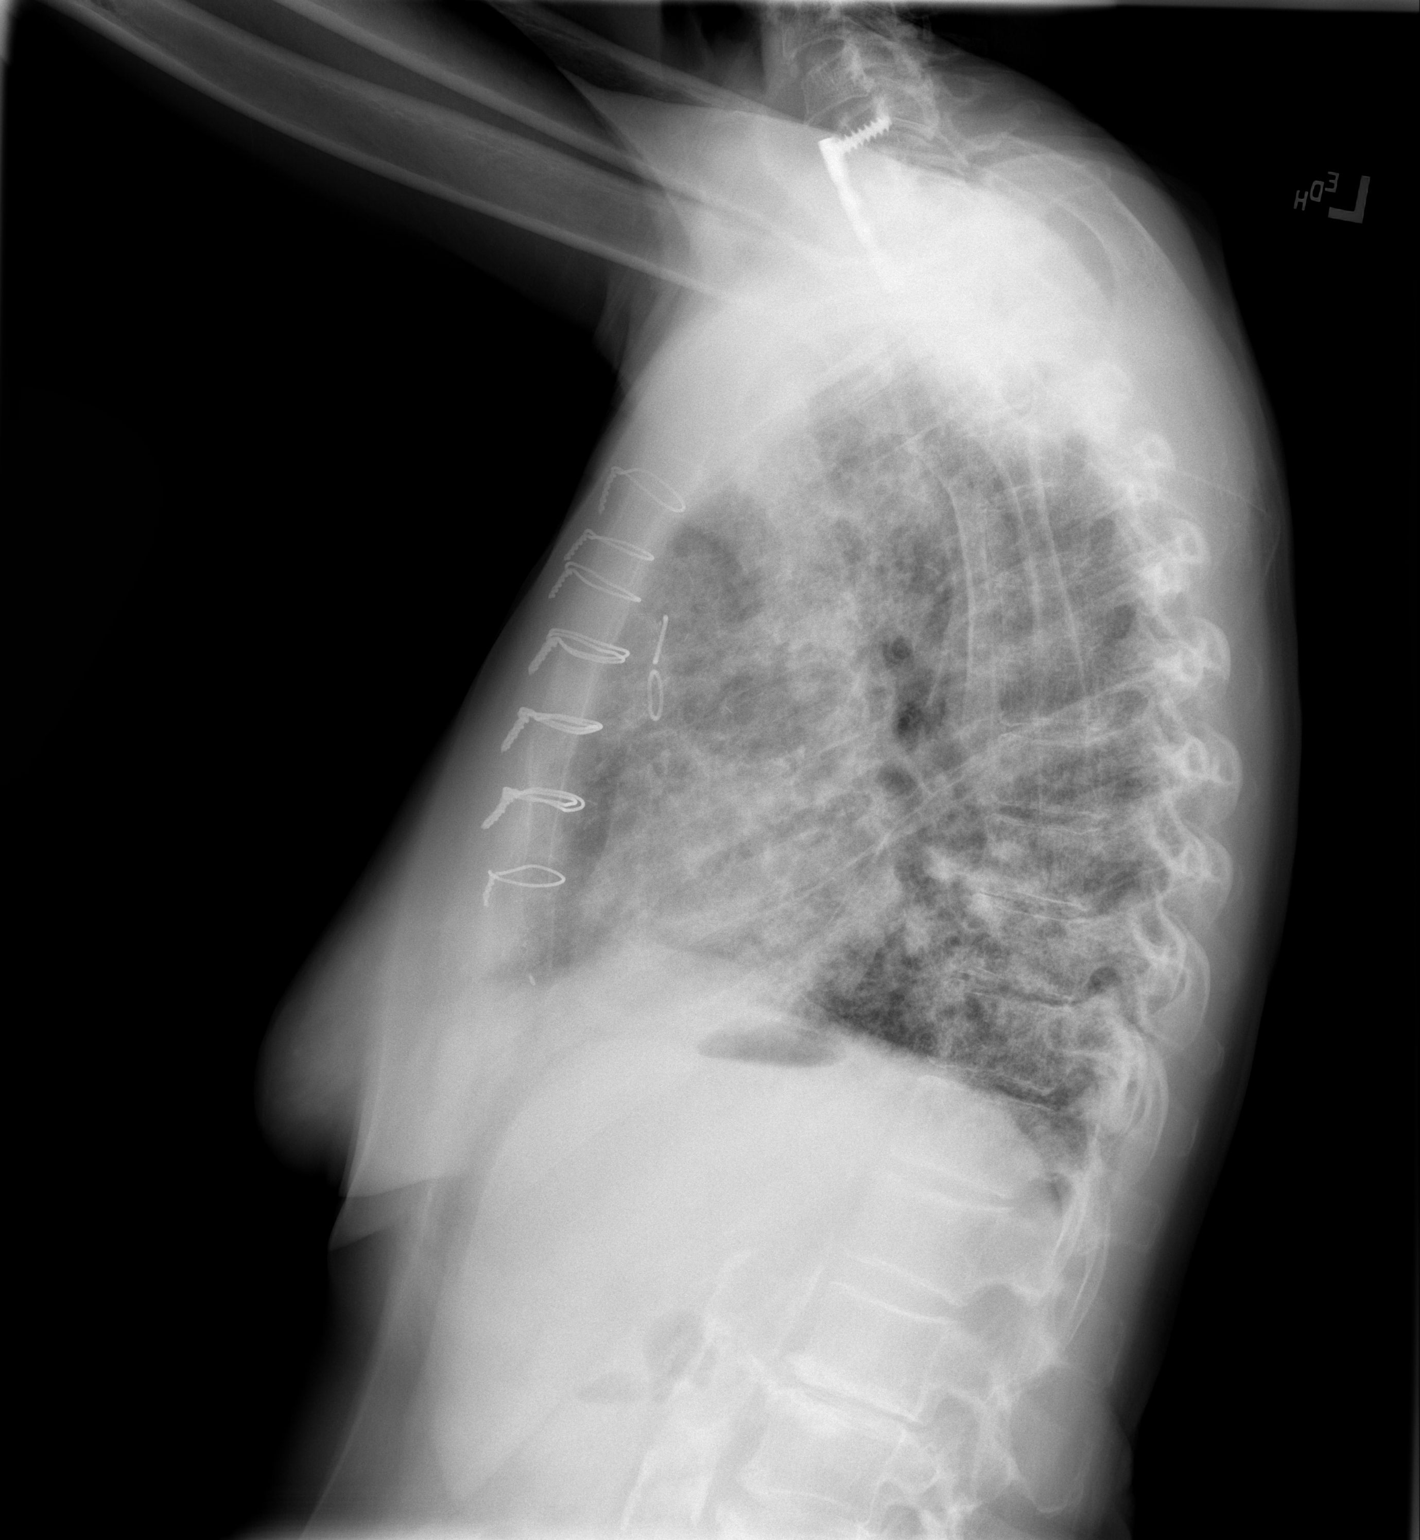

[2 of 2 positions shown; findings below may reference images not displayed]

FINDINGS: The cardiac silhouette, mediastinal and hilar contours are within
normal limits and stable. Stable surgical changes from coronary
artery bypass surgery.

Severe chronic lung disease is again demonstrated. There appears to
be an overlying airspace process with overall slight increased
density of the lungs compared to the prior study. This is most
notable at the left lung base. No definite pleural effusions or
pneumothorax. The bony thorax is intact.
IMPRESSION: Severe underlying lung disease with suspected superimposed acute
airspace process such as bronchopneumonia.

## 2019-03-03 NOTE — Telephone Encounter (Signed)
Called [facility]. Spoke with Daisy. States that the pt's PA for OFEV has been denied.  RA - please advise if you want this appealed.

## 2019-03-03 NOTE — Telephone Encounter (Signed)
She was discharged on home hospice from [facility] so will not pursue any further. Please obtain phone number for the husband so I can discuss

## 2019-03-03 NOTE — Telephone Encounter (Signed)
[place]

## 2019-03-03 NOTE — Telephone Encounter (Signed)
Pt.notified

## 2019-03-04 NOTE — Telephone Encounter (Signed)
Will await on advisement from RA

## 2019-03-05 ENCOUNTER — Ambulatory Visit (HOSPITAL_COMMUNITY): Payer: 59

## 2019-03-05 NOTE — Telephone Encounter (Signed)
Called & checked on her She is on home hospice now, on 20 mg pred , on 10 L O2

## 2019-03-05 NOTE — Telephone Encounter (Deleted)
Per the pt's emergency contact list, the pt's husband's phone number is [phone].

## 2019-03-06 NOTE — Telephone Encounter (Signed)
Pt's OFEV had been denied and in a previous encounter, RA stated due to pt moving to hospice, he is not going to pursue OFEV. Closing encounter.

## 2019-03-06 NOTE — Telephone Encounter (Signed)
Yes please

## 2019-03-06 NOTE — Telephone Encounter (Signed)
Dr. Elsworth Soho - please advise if this message can be closed. Thanks.

## 2019-03-07 ENCOUNTER — Telehealth: Payer: Self-pay | Admitting: Internal Medicine

## 2019-03-07 ENCOUNTER — Ambulatory Visit (HOSPITAL_COMMUNITY): Payer: 59

## 2019-03-07 NOTE — Telephone Encounter (Signed)
Copied from [place] [id number]. Topic: Quick Communication - Rx Refill/Question >> Mar 07, 2019 11:57 AM Nils Flack wrote: Medication: digoxin (LANOXIN) 0.125 MG tablet  Has the patient contacted their pharmacy? Yes (Agent: If no, request that the patient contact the pharmacy for the refill.) (Agent: If yes, when and what did the pharmacy advise?)  Preferred Pharmacy (with phone number or street name): [phone] [facility]  They are required to clal to ask if it is ok to change to a different manufacturer for this drug because it is a narrow therapeutic drug.  Agent: Please be advised that RX refills may take up to 3 business days. We ask that you follow-up with your pharmacy.

## 2019-03-10 ENCOUNTER — Ambulatory Visit (HOSPITAL_COMMUNITY): Payer: 59

## 2019-03-10 NOTE — Telephone Encounter (Signed)
Pt has not been seen by PCP since 2018.

## 2019-03-10 NOTE — Telephone Encounter (Signed)
Okay to refill Lanoxin for 6 months.  Continue as before.  She is under hospice care Thank you

## 2019-03-11 MED ORDER — DIGOXIN 125 MCG PO TABS: 0.1250 mg | ORAL_TABLET | Freq: Every day | ORAL | 1 refills | Status: AC

## 2019-03-11 NOTE — Telephone Encounter (Signed)
RX sent

## 2019-03-12 ENCOUNTER — Ambulatory Visit (HOSPITAL_COMMUNITY): Payer: 59

## 2019-03-14 ENCOUNTER — Ambulatory Visit (HOSPITAL_COMMUNITY): Payer: 59

## 2019-03-15 LAB — RESPIRATORY CULTURE OR RESPIRATORY AND SPUTUM CULTURE
MICRO NUMBER:: 241680
RESULT:: NORMAL
SPECIMEN QUALITY:: ADEQUATE

## 2019-03-15 LAB — MYCOBACTERIA,CULT W/FLUOROCHROME SMEAR
MICRO NUMBER:: 244825
SMEAR: NONE SEEN
SPECIMEN QUALITY:: ADEQUATE

## 2019-03-17 ENCOUNTER — Ambulatory Visit (HOSPITAL_COMMUNITY): Payer: 59

## 2019-03-17 IMAGING — DX DG CHEST 2V
2 series · 2 of 2 positions shown · non-contrast
Comparison: November 14, 2018 and October 01, 2018;March 04, 2018

CLINICAL DATA: Shortness of breath and hypertension

EXAM:
CHEST - 2 VIEW

[dg chest 2 view (1 of 2)]
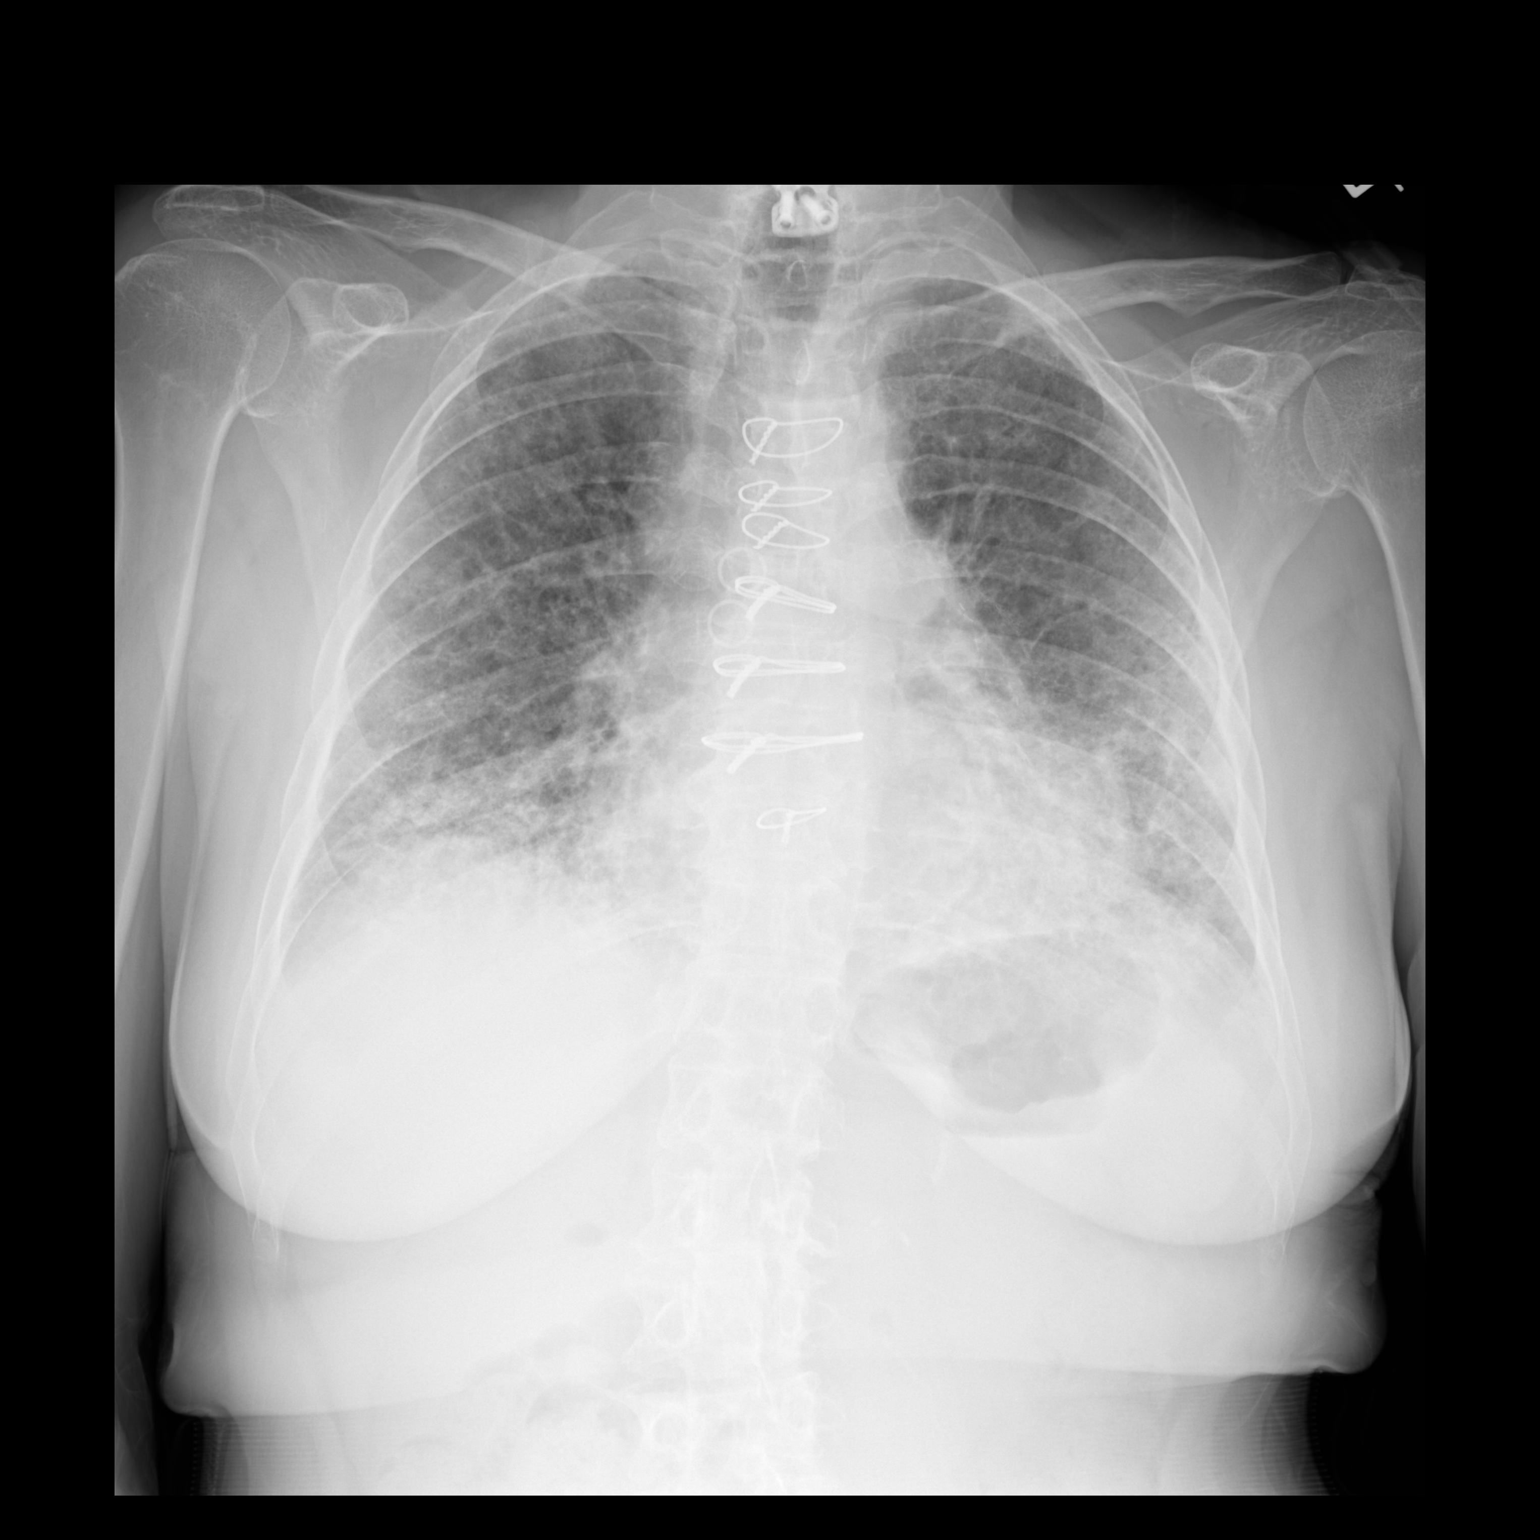

[dg chest 2 view (2 of 2)]
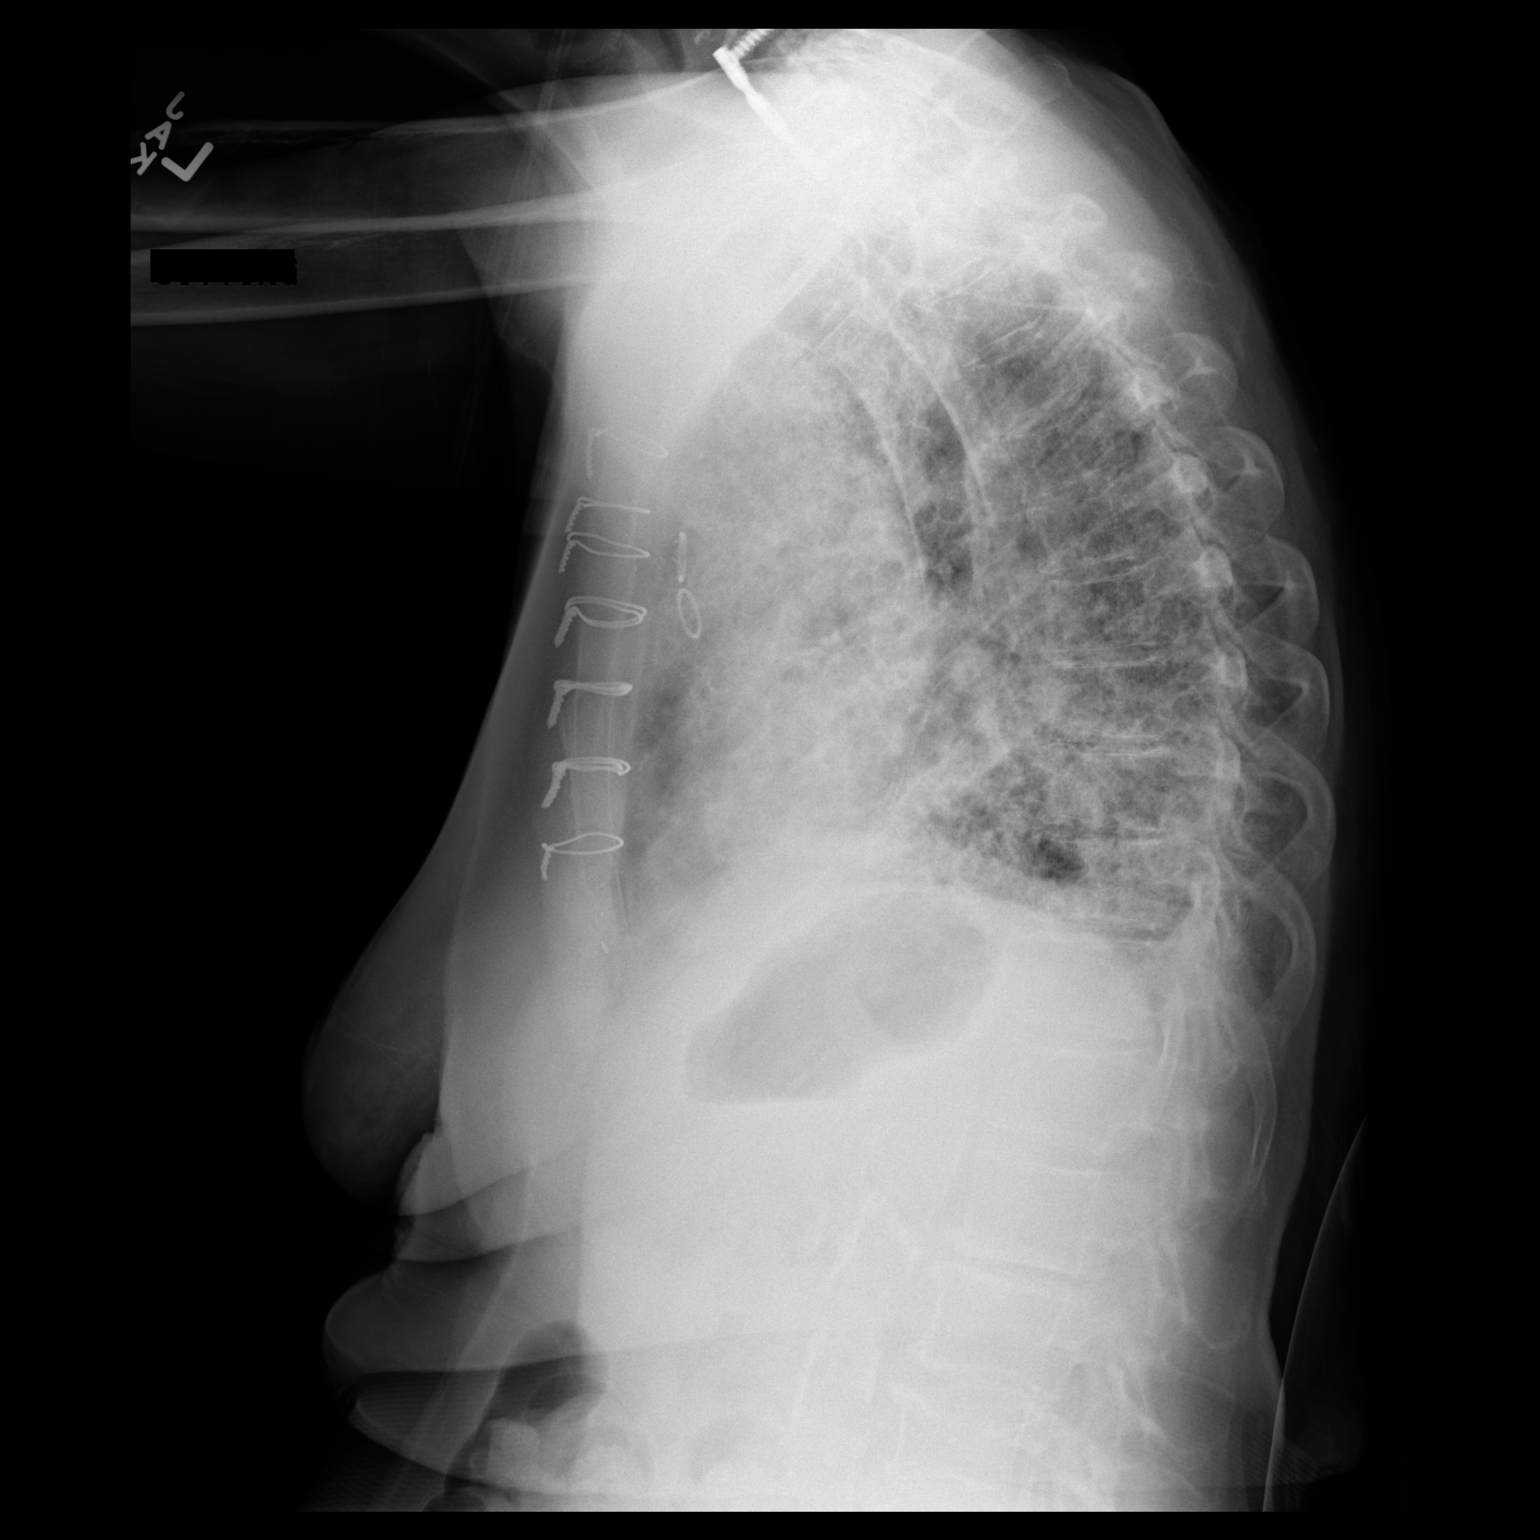

[2 of 2 positions shown; findings below may reference images not displayed]

FINDINGS: There is extensive underlying interstitial fibrosis. There is no
appreciable airspace consolidation. There may be a degree of
interstitial edema superimposed on fibrosis. There is chronic
atelectatic change and scarring in the lung bases.

Heart is upper normal in size with pulmonary vascularity normal. No
adenopathy. Status post coronary artery bypass grafting.
Postoperative change noted in the lower cervical spine. There is
aortic atherosclerosis.
IMPRESSION: Extensive pulmonary fibrosis. A degree of interstitial edema
superimposed can not be excluded, although well-defined edema is not
seen with certainty. There is chronic scarring and atelectasis in
the bases. There is no frank airspace consolidation.

Heart is upper normal in size with pulmonary vascularity normal.
Postoperative changes noted. No evident adenopathy. There is aortic
atherosclerosis. Aortic Atherosclerosis ([id number]).

## 2019-03-19 ENCOUNTER — Ambulatory Visit (HOSPITAL_COMMUNITY): Payer: 59

## 2019-03-21 ENCOUNTER — Ambulatory Visit (HOSPITAL_COMMUNITY): Payer: 59

## 2019-03-21 LAB — AFB CULTURE WITH SMEAR (NOT AT ARMC)
Acid Fast Culture: NEGATIVE
Acid Fast Smear: NEGATIVE

## 2019-03-24 ENCOUNTER — Other Ambulatory Visit: Payer: Self-pay | Admitting: Internal Medicine

## 2019-03-24 ENCOUNTER — Ambulatory Visit (HOSPITAL_COMMUNITY): Payer: 59

## 2019-03-26 ENCOUNTER — Ambulatory Visit (HOSPITAL_COMMUNITY): Payer: 59

## 2019-03-26 ENCOUNTER — Telehealth: Payer: Self-pay | Admitting: Emergency Medicine

## 2019-03-26 NOTE — Telephone Encounter (Signed)
Received call from Hospice nurse, states pt is SOB, coughing at night, and has increased edema in her legs. Pt is on 20 mg lasix po.   Coughing at night yellow/green production. RN would like to know if there is something else for pt to take other than tessalon pearls for cough. Pt just completed zpac.   Marlin Canary RN, [phone]

## 2019-03-26 NOTE — Telephone Encounter (Signed)
Can we sch a VOV? Thx

## 2019-03-27 NOTE — Telephone Encounter (Signed)
Spoke with Beverlee Nims with Hospice to advise on MDs response. She will relay message to patient and contact us if pt wants virtual visit.

## 2019-03-28 ENCOUNTER — Ambulatory Visit (HOSPITAL_COMMUNITY): Payer: 59

## 2019-03-31 ENCOUNTER — Ambulatory Visit (HOSPITAL_COMMUNITY): Payer: 59

## 2019-04-02 ENCOUNTER — Ambulatory Visit (HOSPITAL_COMMUNITY): Payer: 59

## 2019-04-04 ENCOUNTER — Ambulatory Visit (HOSPITAL_COMMUNITY): Payer: 59

## 2019-04-07 ENCOUNTER — Ambulatory Visit (HOSPITAL_COMMUNITY): Payer: 59

## 2019-04-09 ENCOUNTER — Ambulatory Visit (HOSPITAL_COMMUNITY): Payer: 59

## 2019-04-14 IMAGING — DX DG CHEST 2V
2 series · 2 of 2 positions shown · non-contrast
Comparison: 11/28/2018.

CLINICAL DATA: Cough and shortness of breath. Status post CABG in
September 2018. History of asthma.

EXAM:
CHEST - 2 VIEW

[dg chest 2 view (1 of 2)]
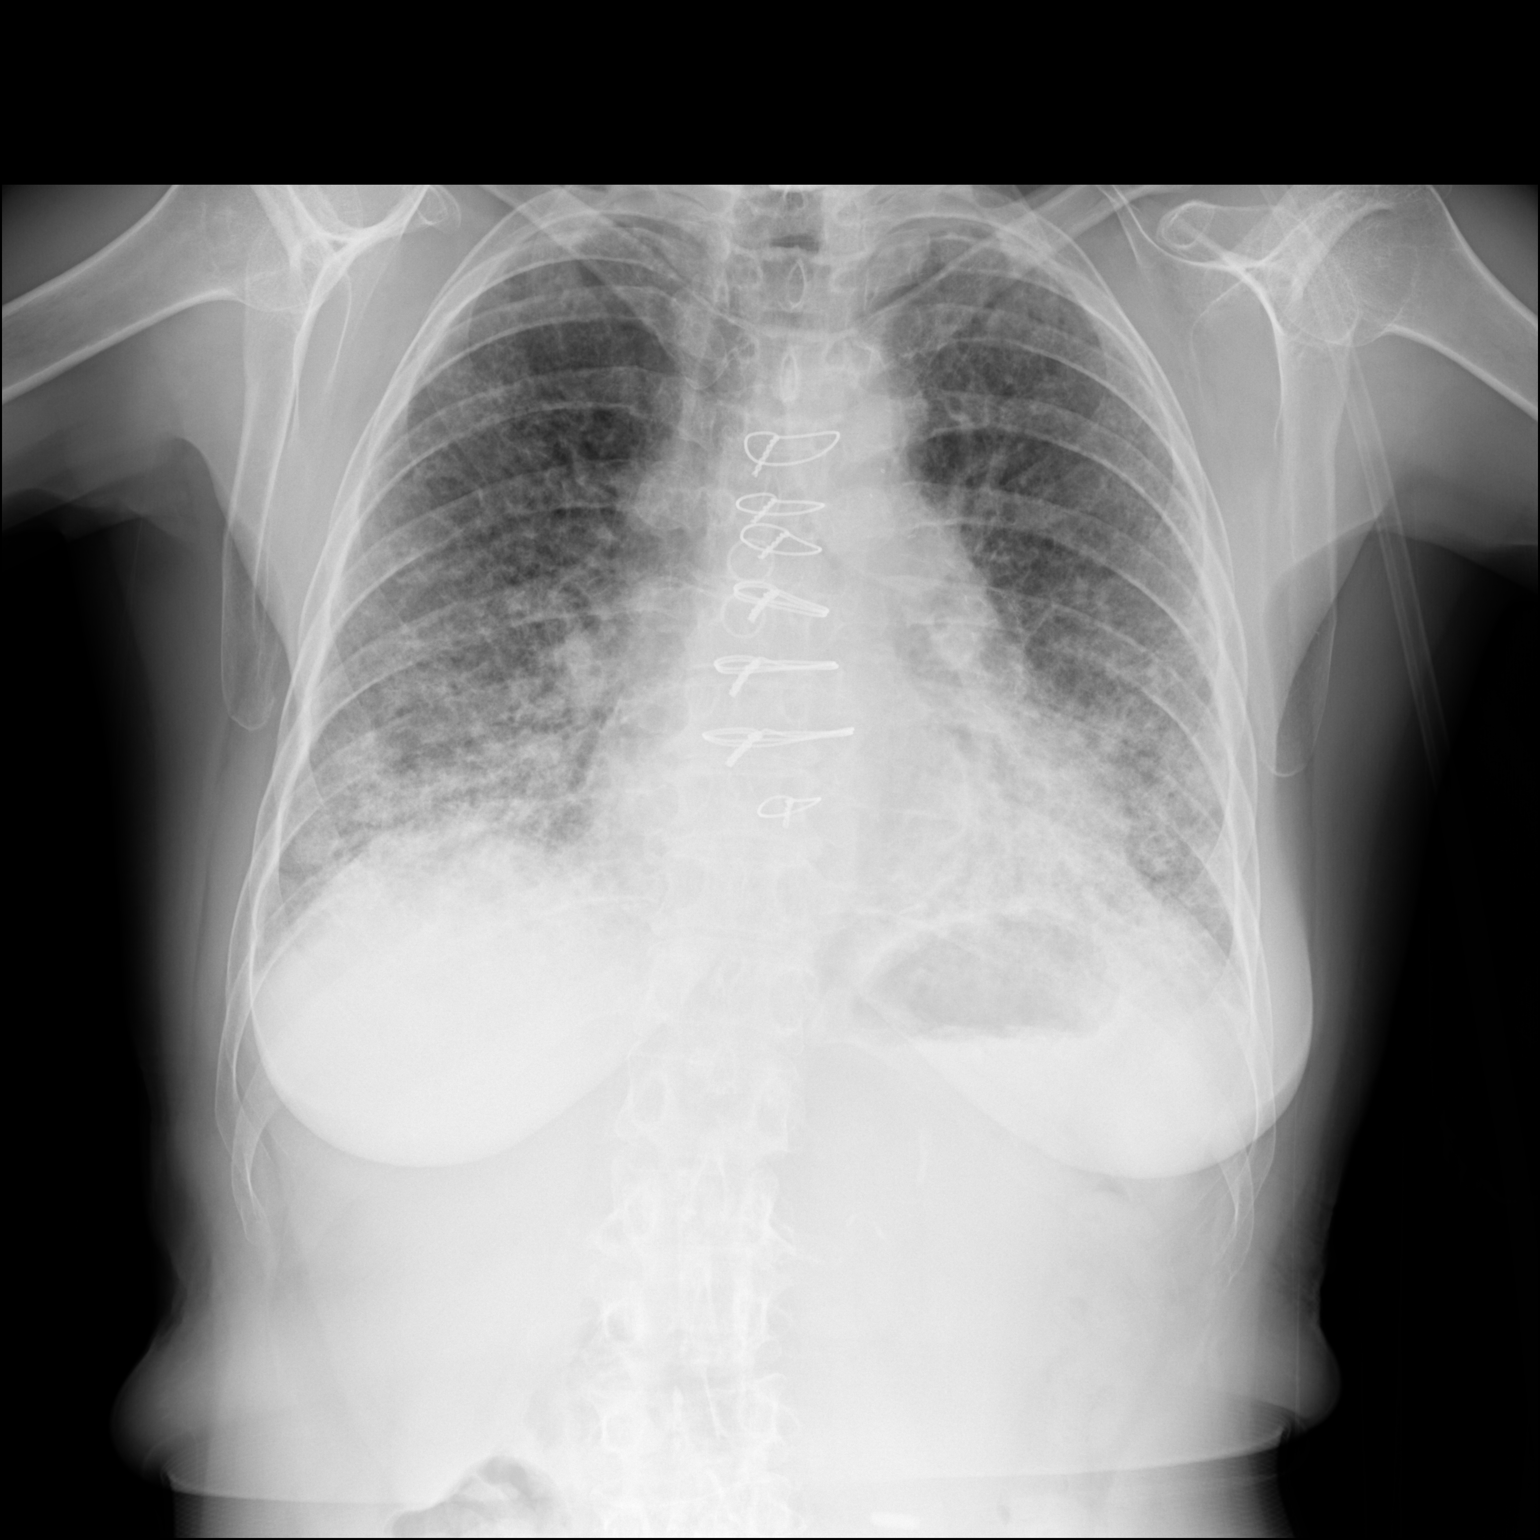

[dg chest 2 view (2 of 2)]
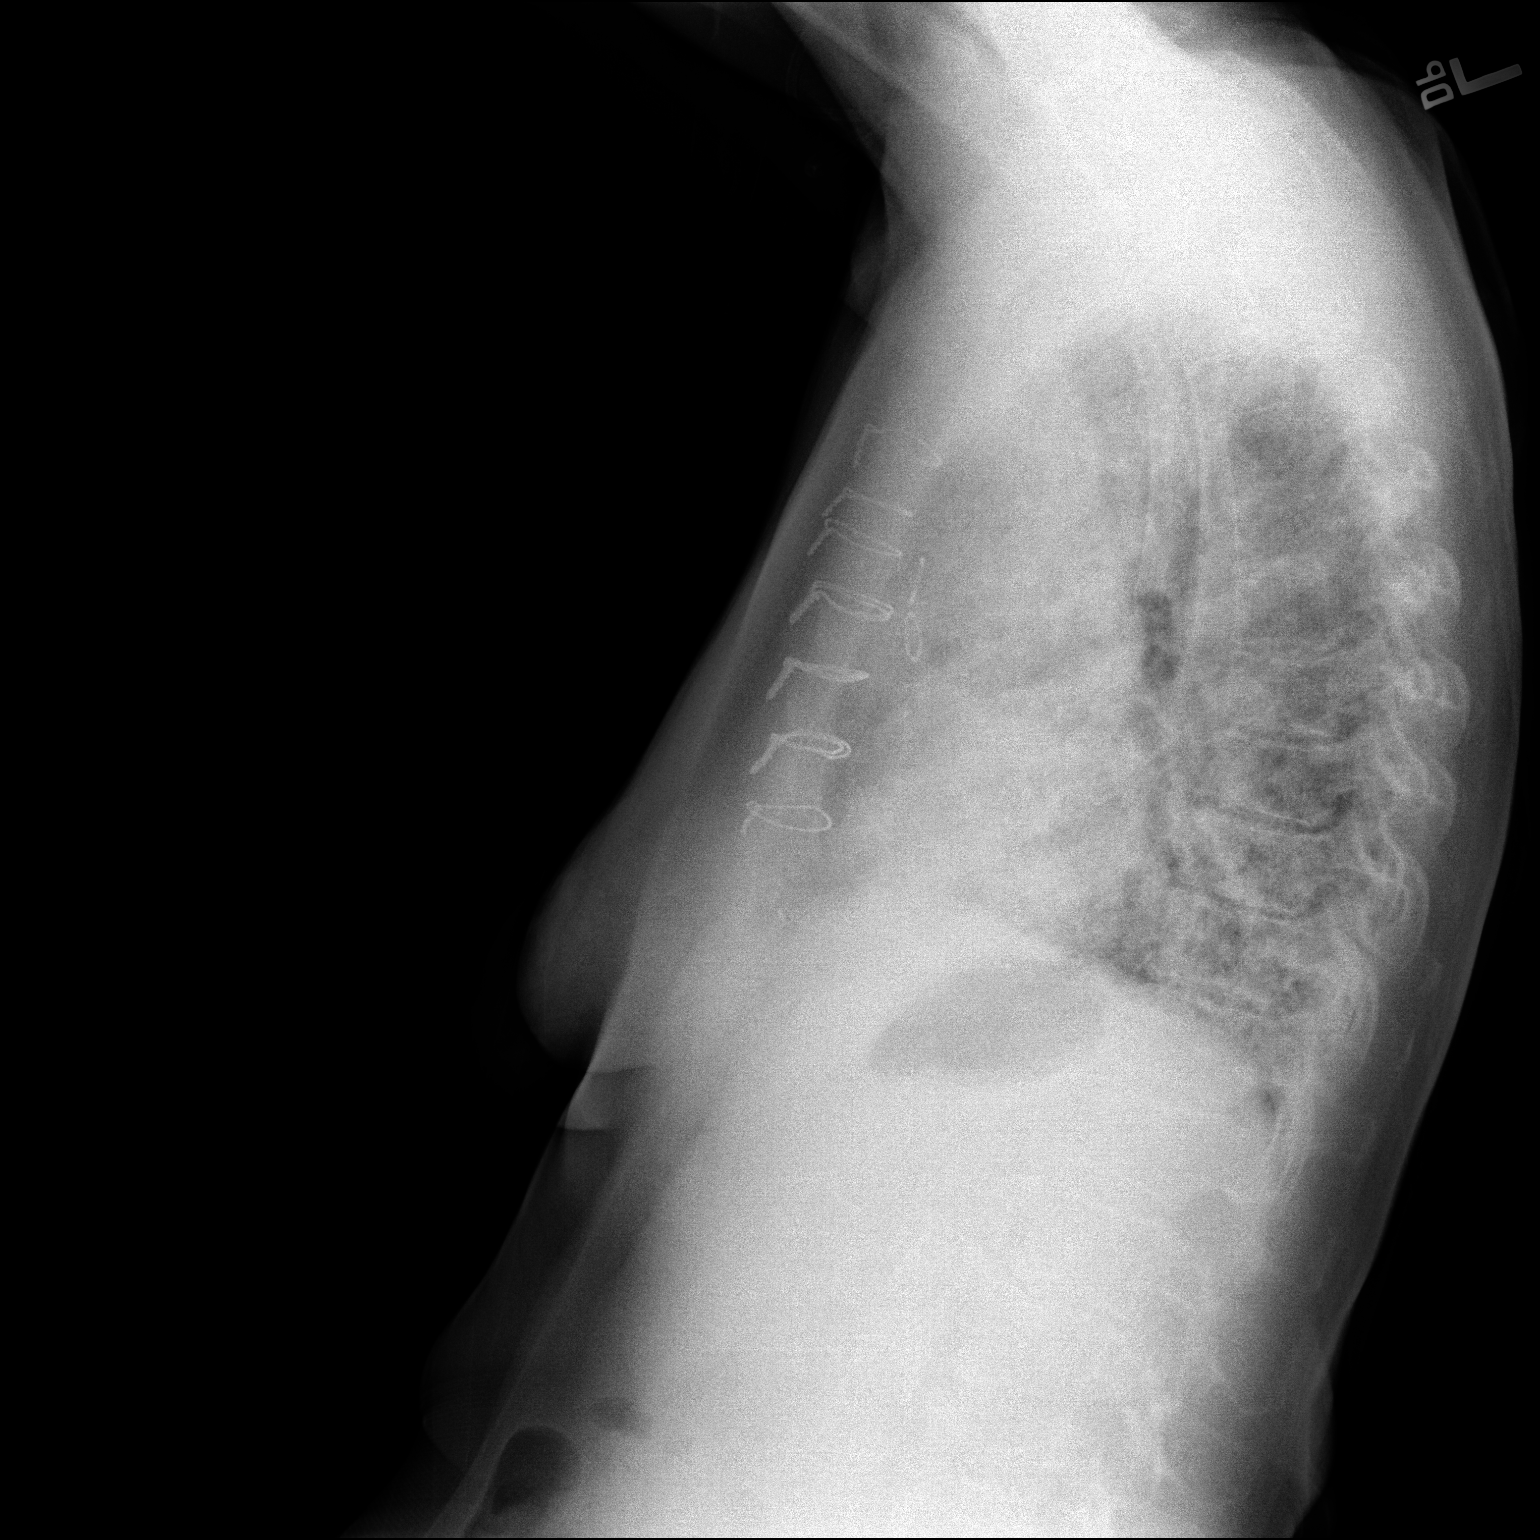

[2 of 2 positions shown; findings below may reference images not displayed]

FINDINGS: Stable enlarged cardiac silhouette and post CABG changes. Stable
marked diffuse prominence of the interstitial markings without
superimposed airspace opacity. Stable cervical spine fixation
hardware and thoracic spine degenerative changes and mild scoliosis.
IMPRESSION: No acute abnormality. Stable cardiomegaly and marked chronic
interstitial lung disease.

## 2019-04-18 ENCOUNTER — Other Ambulatory Visit: Payer: Self-pay | Admitting: Internal Medicine

## 2019-04-18 NOTE — Telephone Encounter (Signed)
Copied from [place] [id number]. Topic: Quick Communication - Rx Refill/Question >> Apr 18, 2019 12:35 PM Wynetta Emery, [place] C wrote: Medication: LORazepam (ATIVAN) 0.5 MG tablet-   Dory Peru with [facility]  [phone] - called in to request Rx for pt. She said that pt just made her aware her that she is almost out of the medication, if possible Jolayne Haines would like to have this today.   Has the patient contacted their pharmacy?  (Agent: If no, request that the patient contact the pharmacy for the refill.) (Agent: If yes, when and what did the pharmacy advise?)  Preferred Pharmacy (with phone number or street name): [phone] - to hospice and they will send to pharmacy.   Agent: Please be advised that RX refills may take up to 3 business days. We ask that you follow-up with your pharmacy.

## 2019-04-21 NOTE — Telephone Encounter (Signed)
Please advise about refill

## 2019-04-22 MED ORDER — LORAZEPAM 0.5 MG PO TABS: 0.5000 mg | ORAL_TABLET | Freq: Two times a day (BID) | ORAL | 1 refills | Status: AC | PRN

## 2019-04-23 NOTE — Telephone Encounter (Signed)
Signed Rx faxed to number below.

## 2019-04-29 ENCOUNTER — Telehealth: Payer: Self-pay

## 2019-04-29 NOTE — Telephone Encounter (Signed)
Received dc from [employer].. DC is for burial. Patient is a patient of Doctor Plotnikov.   DC will be taken to [facility] [place] for signature.  On 04/30/2019 Received dc back from Doctor Plotnikov.  I called funeral home to let them know dc was ready for pickup.

## 2019-05-05 DEATH — deceased
